# Patient Record
Sex: Female | Born: 1940 | Race: White | Hispanic: No | Smoking: Former smoker
Health system: Southern US, Community
[De-identification: ages and names within clinical notes are randomized; demographics above are authoritative.]

## PROBLEM LIST (undated history)

## (undated) DIAGNOSIS — F419 Anxiety disorder, unspecified: Secondary | ICD-10-CM

## (undated) DIAGNOSIS — E7800 Pure hypercholesterolemia, unspecified: Secondary | ICD-10-CM

## (undated) DIAGNOSIS — Z9889 Other specified postprocedural states: Secondary | ICD-10-CM

## (undated) DIAGNOSIS — J42 Unspecified chronic bronchitis: Secondary | ICD-10-CM

## (undated) DIAGNOSIS — F32A Depression, unspecified: Secondary | ICD-10-CM

## (undated) DIAGNOSIS — F5104 Psychophysiologic insomnia: Secondary | ICD-10-CM

## (undated) DIAGNOSIS — F329 Major depressive disorder, single episode, unspecified: Secondary | ICD-10-CM

## (undated) DIAGNOSIS — I771 Stricture of artery: Secondary | ICD-10-CM

## (undated) DIAGNOSIS — I714 Abdominal aortic aneurysm, without rupture, unspecified: Secondary | ICD-10-CM

## (undated) DIAGNOSIS — J984 Other disorders of lung: Secondary | ICD-10-CM

## (undated) DIAGNOSIS — Z860100 Personal history of colon polyps, unspecified: Secondary | ICD-10-CM

## (undated) DIAGNOSIS — I2510 Atherosclerotic heart disease of native coronary artery without angina pectoris: Secondary | ICD-10-CM

## (undated) DIAGNOSIS — R921 Mammographic calcification found on diagnostic imaging of breast: Secondary | ICD-10-CM

## (undated) DIAGNOSIS — G459 Transient cerebral ischemic attack, unspecified: Secondary | ICD-10-CM

## (undated) DIAGNOSIS — Z8673 Personal history of transient ischemic attack (TIA), and cerebral infarction without residual deficits: Secondary | ICD-10-CM

## (undated) DIAGNOSIS — C801 Malignant (primary) neoplasm, unspecified: Secondary | ICD-10-CM

## (undated) DIAGNOSIS — K219 Gastro-esophageal reflux disease without esophagitis: Secondary | ICD-10-CM

## (undated) DIAGNOSIS — E119 Type 2 diabetes mellitus without complications: Secondary | ICD-10-CM

## (undated) DIAGNOSIS — N183 Chronic kidney disease, stage 3 unspecified: Secondary | ICD-10-CM

## (undated) DIAGNOSIS — I6529 Occlusion and stenosis of unspecified carotid artery: Secondary | ICD-10-CM

## (undated) DIAGNOSIS — I6523 Occlusion and stenosis of bilateral carotid arteries: Secondary | ICD-10-CM

## (undated) DIAGNOSIS — I10 Essential (primary) hypertension: Secondary | ICD-10-CM

## (undated) DIAGNOSIS — J449 Chronic obstructive pulmonary disease, unspecified: Secondary | ICD-10-CM

## (undated) DIAGNOSIS — G43909 Migraine, unspecified, not intractable, without status migrainosus: Secondary | ICD-10-CM

## (undated) DIAGNOSIS — E079 Disorder of thyroid, unspecified: Secondary | ICD-10-CM

## (undated) DIAGNOSIS — I639 Cerebral infarction, unspecified: Secondary | ICD-10-CM

## (undated) DIAGNOSIS — M810 Age-related osteoporosis without current pathological fracture: Secondary | ICD-10-CM

## (undated) DIAGNOSIS — R002 Palpitations: Secondary | ICD-10-CM

## (undated) DIAGNOSIS — E039 Hypothyroidism, unspecified: Secondary | ICD-10-CM

## (undated) DIAGNOSIS — I701 Atherosclerosis of renal artery: Secondary | ICD-10-CM

## (undated) DIAGNOSIS — N289 Disorder of kidney and ureter, unspecified: Secondary | ICD-10-CM

## (undated) DIAGNOSIS — I739 Peripheral vascular disease, unspecified: Secondary | ICD-10-CM

## (undated) DIAGNOSIS — R0602 Shortness of breath: Secondary | ICD-10-CM

## (undated) DIAGNOSIS — E1159 Type 2 diabetes mellitus with other circulatory complications: Secondary | ICD-10-CM

## (undated) DIAGNOSIS — H02039 Senile entropion of unspecified eye, unspecified eyelid: Secondary | ICD-10-CM

## (undated) DIAGNOSIS — M1990 Unspecified osteoarthritis, unspecified site: Secondary | ICD-10-CM

## (undated) HISTORY — PX: CAROTID PTA/STENT INTERVENTION: CATH118231

## (undated) HISTORY — DX: Type 2 diabetes mellitus with other circulatory complications: E11.59

## (undated) HISTORY — DX: Shortness of breath: R06.02

## (undated) HISTORY — DX: Hypothyroidism, unspecified: E03.9

## (undated) HISTORY — DX: Migraine, unspecified, not intractable, without status migrainosus: G43.909

## (undated) HISTORY — DX: Depression, unspecified: F32.A

## (undated) HISTORY — DX: Age-related osteoporosis without current pathological fracture: M81.0

## (undated) HISTORY — DX: Mammographic calcification found on diagnostic imaging of breast: R92.1

## (undated) HISTORY — PX: MEDIASTINOSCOPY: SUR861

## (undated) HISTORY — PX: ABDOMINAL AORTIC ANEURYSM REPAIR: SUR1152

## (undated) HISTORY — DX: Gastro-esophageal reflux disease without esophagitis: K21.9

## (undated) HISTORY — DX: Unspecified osteoarthritis, unspecified site: M19.90

## (undated) HISTORY — DX: Anxiety disorder, unspecified: F41.9

## (undated) HISTORY — DX: Peripheral vascular disease, unspecified: I73.9

## (undated) HISTORY — PX: EYE SURGERY: SHX253

## (undated) HISTORY — PX: MASTECTOMY: SHX3

## (undated) HISTORY — PX: BACK SURGERY: SHX140

## (undated) HISTORY — DX: Psychophysiologic insomnia: F51.04

## (undated) HISTORY — DX: Transient cerebral ischemic attack, unspecified: G45.9

## (undated) HISTORY — DX: Chronic kidney disease, stage 3 (moderate): N18.3

## (undated) HISTORY — DX: Other specified postprocedural states: Z98.890

## (undated) HISTORY — PX: BRONCHOSCOPY: SUR163

## (undated) HISTORY — DX: Chronic kidney disease, stage 3 unspecified: N18.30

## (undated) HISTORY — PX: CARDIAC SURGERY: SHX584

## (undated) HISTORY — DX: Stricture of artery: I77.1

## (undated) HISTORY — DX: Palpitations: R00.2

## (undated) HISTORY — PX: COLONOSCOPY: SHX174

## (undated) HISTORY — DX: Major depressive disorder, single episode, unspecified: F32.9

## (undated) HISTORY — DX: Occlusion and stenosis of unspecified carotid artery: I65.29

## (undated) HISTORY — DX: Personal history of transient ischemic attack (TIA), and cerebral infarction without residual deficits: Z86.73

---

## 2006-06-10 ENCOUNTER — Ambulatory Visit: Payer: Self-pay

## 2006-06-12 ENCOUNTER — Ambulatory Visit: Payer: Self-pay

## 2006-07-01 ENCOUNTER — Ambulatory Visit: Payer: Self-pay | Admitting: Surgery

## 2006-07-01 HISTORY — PX: BREAST EXCISIONAL BIOPSY: SUR124

## 2007-06-23 ENCOUNTER — Ambulatory Visit: Payer: Self-pay

## 2008-06-23 ENCOUNTER — Ambulatory Visit: Payer: Self-pay | Admitting: Vascular Surgery

## 2008-06-26 ENCOUNTER — Ambulatory Visit: Payer: Self-pay | Admitting: Internal Medicine

## 2008-07-06 ENCOUNTER — Inpatient Hospital Stay: Payer: Self-pay | Admitting: Internal Medicine

## 2008-07-25 ENCOUNTER — Ambulatory Visit: Payer: Self-pay | Admitting: Vascular Surgery

## 2008-08-04 ENCOUNTER — Ambulatory Visit: Payer: Self-pay | Admitting: Vascular Surgery

## 2008-08-11 ENCOUNTER — Inpatient Hospital Stay: Payer: Self-pay | Admitting: Vascular Surgery

## 2008-09-01 HISTORY — PX: BREAST EXCISIONAL BIOPSY: SUR124

## 2008-09-03 ENCOUNTER — Emergency Department: Payer: Self-pay | Admitting: Internal Medicine

## 2008-11-07 ENCOUNTER — Ambulatory Visit: Payer: Self-pay | Admitting: Gastroenterology

## 2009-07-30 ENCOUNTER — Ambulatory Visit: Payer: Self-pay | Admitting: Internal Medicine

## 2009-08-02 ENCOUNTER — Ambulatory Visit: Payer: Self-pay | Admitting: Internal Medicine

## 2009-08-09 ENCOUNTER — Ambulatory Visit: Payer: Self-pay | Admitting: Surgery

## 2009-08-14 ENCOUNTER — Ambulatory Visit: Payer: Self-pay | Admitting: Surgery

## 2010-08-01 ENCOUNTER — Ambulatory Visit: Payer: Self-pay | Admitting: Internal Medicine

## 2011-09-04 ENCOUNTER — Ambulatory Visit: Payer: Self-pay | Admitting: Internal Medicine

## 2011-09-19 ENCOUNTER — Ambulatory Visit: Payer: Self-pay | Admitting: Gastroenterology

## 2011-09-23 LAB — PATHOLOGY REPORT

## 2012-04-12 ENCOUNTER — Ambulatory Visit: Payer: Self-pay | Admitting: Specialist

## 2012-04-12 LAB — CREATININE, SERUM
Creatinine: 1.28 mg/dL (ref 0.60–1.30)
EGFR (Non-African Amer.): 43 — ABNORMAL LOW

## 2012-09-07 ENCOUNTER — Ambulatory Visit: Payer: Self-pay | Admitting: Internal Medicine

## 2012-10-05 ENCOUNTER — Ambulatory Visit: Payer: Self-pay | Admitting: Specialist

## 2012-10-18 ENCOUNTER — Ambulatory Visit: Payer: Self-pay | Admitting: Vascular Surgery

## 2012-10-18 LAB — CREATININE, SERUM
Creatinine: 1.15 mg/dL (ref 0.60–1.30)
EGFR (African American): 56 — ABNORMAL LOW
EGFR (Non-African Amer.): 48 — ABNORMAL LOW

## 2012-10-18 LAB — BUN: BUN: 23 mg/dL — ABNORMAL HIGH (ref 7–18)

## 2012-11-26 ENCOUNTER — Ambulatory Visit: Payer: Self-pay | Admitting: Unknown Physician Specialty

## 2012-12-28 ENCOUNTER — Ambulatory Visit: Payer: Self-pay

## 2013-06-01 DIAGNOSIS — M48061 Spinal stenosis, lumbar region without neurogenic claudication: Secondary | ICD-10-CM | POA: Insufficient documentation

## 2013-07-18 DIAGNOSIS — I10 Essential (primary) hypertension: Secondary | ICD-10-CM | POA: Insufficient documentation

## 2013-07-18 DIAGNOSIS — E039 Hypothyroidism, unspecified: Secondary | ICD-10-CM | POA: Insufficient documentation

## 2013-07-18 DIAGNOSIS — Z9889 Other specified postprocedural states: Secondary | ICD-10-CM | POA: Insufficient documentation

## 2013-07-18 DIAGNOSIS — F3342 Major depressive disorder, recurrent, in full remission: Secondary | ICD-10-CM | POA: Insufficient documentation

## 2013-07-18 DIAGNOSIS — K219 Gastro-esophageal reflux disease without esophagitis: Secondary | ICD-10-CM | POA: Insufficient documentation

## 2013-07-25 HISTORY — PX: LAMINECTOMY: SHX219

## 2013-09-22 ENCOUNTER — Ambulatory Visit: Payer: Self-pay | Admitting: Internal Medicine

## 2013-09-28 ENCOUNTER — Ambulatory Visit: Payer: Self-pay | Admitting: Internal Medicine

## 2013-11-09 DIAGNOSIS — M1610 Unilateral primary osteoarthritis, unspecified hip: Secondary | ICD-10-CM | POA: Insufficient documentation

## 2013-12-22 ENCOUNTER — Ambulatory Visit: Payer: Self-pay | Admitting: Orthopedic Surgery

## 2014-02-08 DIAGNOSIS — F329 Major depressive disorder, single episode, unspecified: Secondary | ICD-10-CM | POA: Insufficient documentation

## 2014-02-08 DIAGNOSIS — I739 Peripheral vascular disease, unspecified: Secondary | ICD-10-CM | POA: Insufficient documentation

## 2014-02-08 DIAGNOSIS — F32A Depression, unspecified: Secondary | ICD-10-CM | POA: Insufficient documentation

## 2014-02-08 DIAGNOSIS — F5104 Psychophysiologic insomnia: Secondary | ICD-10-CM | POA: Insufficient documentation

## 2014-02-08 DIAGNOSIS — M810 Age-related osteoporosis without current pathological fracture: Secondary | ICD-10-CM | POA: Insufficient documentation

## 2014-02-08 DIAGNOSIS — E1159 Type 2 diabetes mellitus with other circulatory complications: Secondary | ICD-10-CM | POA: Insufficient documentation

## 2014-02-08 DIAGNOSIS — F419 Anxiety disorder, unspecified: Secondary | ICD-10-CM

## 2014-03-14 DIAGNOSIS — M25551 Pain in right hip: Secondary | ICD-10-CM | POA: Insufficient documentation

## 2014-04-04 ENCOUNTER — Ambulatory Visit: Payer: Self-pay | Admitting: Internal Medicine

## 2014-04-06 DIAGNOSIS — R921 Mammographic calcification found on diagnostic imaging of breast: Secondary | ICD-10-CM | POA: Insufficient documentation

## 2014-04-14 ENCOUNTER — Ambulatory Visit: Payer: Self-pay | Admitting: Specialist

## 2014-08-11 DIAGNOSIS — N183 Chronic kidney disease, stage 3 unspecified: Secondary | ICD-10-CM | POA: Insufficient documentation

## 2014-08-11 DIAGNOSIS — J449 Chronic obstructive pulmonary disease, unspecified: Secondary | ICD-10-CM | POA: Insufficient documentation

## 2014-10-06 ENCOUNTER — Ambulatory Visit: Payer: Self-pay | Admitting: Internal Medicine

## 2014-10-11 DIAGNOSIS — M1611 Unilateral primary osteoarthritis, right hip: Secondary | ICD-10-CM | POA: Insufficient documentation

## 2014-11-13 ENCOUNTER — Ambulatory Visit: Payer: Self-pay | Admitting: Vascular Surgery

## 2014-12-22 NOTE — Op Note (Signed)
PATIENT NAME:  Tamara Finley, Tamara Finley MR#:  [id number] DATE OF BIRTH:  1941/07/20  DATE OF PROCEDURE:  10/18/2012  PREOPERATIVE DIAGNOSES: 1. Peripheral arterial disease with claudication, bilateral lower extremities.  2. 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  3. Hypertension.   POSTOPERATIVE DIAGNOSES:  1. Peripheral arterial disease with claudication, bilateral lower extremities.  2. 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  3. Hypertension.   PROCEDURES PERFORMED: 1. Ultrasound guidance for vascular access, left femoral artery.  2. Catheter placement into aorta from left femoral approach.  3. Aortogram and iliofemoral arteriogram.  4. StarClose closure device, left femoral artery.   SURGEON: Algernon Huxley, M.D.   ANESTHESIA: Local with moderate conscious sedation.   ESTIMATED BLOOD LOSS: Minimal.   INDICATION FOR PROCEDURE: This is a 74 year old white female who is complaining of lower extremity pain with activity. She was found to have some malperfusion on noninvasive studies, a little worse on the right. Imaging is performed as there is suggestion of potential flow problems within the iliac system. Risks and benefits were discussed and informed consent was obtained.   DESCRIPTION OF PROCEDURE: The patient was brought to the vascular and interventional radiology suite. Groins were shaved and prepped and a sterile surgical field was created. The left femoral artery was visualized with ultrasound and found to be patent. It was then accessed under direct ultrasound guidance without difficulty with a Seldinger needle. A J-wire was placed and 5-French sheath was then placed. Pigtail catheter was placed, in the aorta, at the L1-L2 level, and aortogram was performed. This showed what appeared to be renal arteries with mild stenosis that was not flow limiting in either renal artery. There was a somewhat ringlike stenosis at the proximal  anastomosis. This did not appear high-grade. This appeared to be in the 50% range likely, at the worst. The left iliac system was slightly redundant but was patent. The right iliac system had a very mild narrowing at the origin of the right iliac limb, but this was less than 30% and then was patent down to the iliac arteries and throughout the iliac system. At this point, there were no straight forward easy endovascular options for repair today. The stenosis appeared to be just below the renal arteries and it was unclear if this would be amenable to any sort of stent graft repair in the future. Again, this was not high-grade and did not appear to be a very flow-limiting lesion and so need for repair at this time is unlikely. I elected to terminate the procedure. The StarClose closure device was deployed in the left femoral artery with excellent hemostatic result. The patient tolerated the procedure well and was taken to the recovery room in stable condition. ____________________________ Algernon Huxley, MD jsd:sb D: 10/18/2012 10:54:40 ET T: 10/18/2012 13:43:57 ET JOB#: [id number]  cc: Algernon Huxley, MD, <Dictator> Algernon Huxley MD ELECTRONICALLY SIGNED 10/18/2012 16:58

## 2014-12-31 NOTE — Op Note (Signed)
PATIENT NAME:  Tamara Finley, Tamara Finley MR#:  [id number] DATE OF BIRTH:  Apr 11, 1941  DATE OF PROCEDURE:  11/13/2014  PREOPERATIVE DIAGNOSES: 1.  Bilateral renal artery stenosis with small right kidney.  2.  Status post aortobiiliac bypass.  3.  Severe poorly controlled hypertension.  4.  Chronic kidney disease   POSTOPERATIVE DIAGNOSES:  1.  Bilateral renal artery stenosis with small right kidney.  2.  Status post aortobiiliac bypass.  3.  Severe poorly controlled hypertension.  4.  Chronic kidney disease   PROCEDURES: 1.  Ultrasound guidance for vascular access to right femoral artery.  2.  Catheter placement to left renal artery from right femoral approach.  3.  Aortogram and selective left renal angiogram.  4.  Placement of a 6 mm diameter x 18 mm length balloon expandable stent to the left renal artery with the use of the Medtronic PercuSurge embolic protection device.  5.  StarClose closure device of the right femoral artery.   SURGEON: Leotis Pain, MD  ANESTHESIA: Local with moderate conscious sedation.   ESTIMATED BLOOD LOSS: 25 mL.   INDICATION FOR PROCEDURE: This is a 74 year old female who has noninvasive studies suggesting bilateral renal artery stenosis. Her right kidney is small and suspected poorly functioning. In addition to her renal artery stenosis, she has severe hypertension and chronic kidney disease. With these findings she is brought in for an angiogram for further evaluation and potential treatment of her renal artery stenosis. Risks and benefits were discussed. Informed consent was obtained.   DESCRIPTION OF PROCEDURE: The patient is brought to the vascular suite. Groins were shaved and prepped and a sterile surgical field was created. The right femoral head was localized with fluoroscopy and the right femoral artery was accessed without difficulty with a Seldinger needle under direct ultrasound guidance and a permanent image was recorded. A J-wire and 5-French sheath were  then placed. Pigtail catheter was placed but this required using an Advantage and a Kumpe catheter and imaging through the right femoral sheath to navigate through her aortobiiliac bypass. There was probably a 60 to 70% stenosis of the aorta just proximal to her bypass and the limbs were somewhat redundant, more so on the left than the right. Once the Pigtail catheter was up, imaging was performed. The right nephrogram was poor. The right renal artery appeared to come off a very calcific somewhat aneurysmal portion of the aorta and have a high-grade stenosis, although it was not easily opacified on initial imaging. The left renal artery stenosis appeared to be in the 80 to 85% range on the initial angiogram. As this was the more dominant functional kidney, this was our main target for today. I selectively cannulated the left renal artery after upsizing to a 6-French sheath and giving the patient 4000 units of intravenous heparin and 12.5 grams of mannitol. The selective image of the left renal artery showed again what appeared to be an 80to 85% stenosis in the proximal main left renal artery with a good nephrogram distally and a good normal artery after the first 6 to 8 mm. I then used the Medtronic PercuSurge embolic protection device, parked this in the distal main renal artery and inflated it to 6 mm, which imaging showed good placement of the distal balloon. I then brought a 6 mm diameter x 18 mm length balloon expandable stent over the wire and into the proximal main renal artery bringing its proximal portion out about 2 to 3 mm into the aorta encompassing the entire  lesion and to normal renal artery beyond the stenosis. This was inflated to 12 atmospheres. A tight narrowing was seen, which resolved with inflation, and completion angiogram showed the stent to be widely patent with a good nephrogram. We aspirated 2 syringes of blood and flushed the artery before deflating the embolic protection device and total  time of inflation was about 5 minutes. At this point, I elected to terminate the procedure. The guide catheter was removed, oblique arteriogram was performed of the right femoral artery and StarClose closure device was deployed in the usual fashion with excellent hemostatic result. The patient tolerated the procedure well and was taken to the recovery room in stable condition.   ____________________________ Algernon Huxley, MD jsd:sb D: 11/13/2014 10:01:37 ET T: 11/13/2014 10:12:13 ET JOB#: [id number]  cc: Algernon Huxley, MD, <Dictator> Christena Flake. Raechel Ache, MD Algernon Huxley MD ELECTRONICALLY SIGNED 11/20/2014 15:00

## 2015-07-27 ENCOUNTER — Encounter: Payer: Self-pay | Admitting: Emergency Medicine

## 2015-07-27 ENCOUNTER — Ambulatory Visit
Admission: EM | Admit: 2015-07-27 | Discharge: 2015-07-27 | Disposition: A | Discharge status: Home / Self Care | Payer: Medicare HMO | Attending: Family Medicine | Admitting: Family Medicine

## 2015-07-27 ENCOUNTER — Ambulatory Visit (INDEPENDENT_AMBULATORY_CARE_PROVIDER_SITE_OTHER): Payer: Medicare HMO

## 2015-07-27 DIAGNOSIS — J209 Acute bronchitis, unspecified: Secondary | ICD-10-CM

## 2015-07-27 DIAGNOSIS — J0110 Acute frontal sinusitis, unspecified: Secondary | ICD-10-CM

## 2015-07-27 DIAGNOSIS — H6593 Unspecified nonsuppurative otitis media, bilateral: Secondary | ICD-10-CM

## 2015-07-27 HISTORY — DX: Essential (primary) hypertension: I10

## 2015-07-27 MED ORDER — MINOCYCLINE HCL 100 MG PO CAPS: 100.0000 mg | ORAL_CAPSULE | Freq: Two times a day (BID) | ORAL | Status: AC

## 2015-07-27 MED ORDER — PREDNISONE 50 MG PO TABS: 50.0000 mg | ORAL_TABLET | Freq: Every day | ORAL | Status: AC

## 2015-07-27 MED ORDER — IPRATROPIUM-ALBUTEROL 0.5-2.5 (3) MG/3ML IN SOLN
3.0000 mL | Freq: Once | RESPIRATORY_TRACT | Status: AC
  Administered 2015-07-27: 3 mL via RESPIRATORY_TRACT

## 2015-07-27 MED ORDER — FLUTICASONE PROPIONATE 50 MCG/ACT NA SUSP: 1.0000 | Freq: Two times a day (BID) | NASAL | Status: DC

## 2015-07-27 MED ORDER — BENZONATATE 100 MG PO CAPS: 100.0000 mg | ORAL_CAPSULE | Freq: Three times a day (TID) | ORAL | Status: AC

## 2015-07-27 MED ORDER — SALINE SPRAY 0.65 % NA SOLN: 2.0000 | NASAL | Status: DC

## 2015-07-27 NOTE — Discharge Instructions (Signed)
Acute Bronchitis °Bronchitis is inflammation of the airways that extend from the windpipe into the lungs (bronchi). The inflammation often causes mucus to develop. This leads to a cough, which is the most common symptom of bronchitis.  °In acute bronchitis, the condition usually develops suddenly and goes away over time, usually in a couple weeks. Smoking, allergies, and asthma can make bronchitis worse. Repeated episodes of bronchitis may cause further lung problems.  °CAUSES °Acute bronchitis is most often caused by the same virus that causes a cold. The virus can spread from person to person (contagious) through coughing, sneezing, and touching contaminated objects. °SIGNS AND SYMPTOMS  °· Cough.   °· Fever.   °· Coughing up mucus.   °· Body aches.   °· Chest congestion.   °· Chills.   °· Shortness of breath.   °· Sore throat.   °DIAGNOSIS  °Acute bronchitis is usually diagnosed through a physical exam. Your health care provider will also ask you questions about your medical history. Tests, such as chest X-rays, are sometimes done to rule out other conditions.  °TREATMENT  °Acute bronchitis usually goes away in a couple weeks. Oftentimes, no medical treatment is necessary. Medicines are sometimes given for relief of fever or cough. Antibiotic medicines are usually not needed but may be prescribed in certain situations. In some cases, an inhaler may be recommended to help reduce shortness of breath and control the cough. A cool mist vaporizer may also be used to help thin bronchial secretions and make it easier to clear the chest.  °HOME CARE INSTRUCTIONS °· Get plenty of rest.   °· Drink enough fluids to keep your urine clear or pale yellow (unless you have a medical condition that requires fluid restriction). Increasing fluids may help thin your respiratory secretions (sputum) and reduce chest congestion, and it will prevent dehydration.   °· Take medicines only as directed by your health care provider. °· If  you were prescribed an antibiotic medicine, finish it all even if you start to feel better. °· Avoid smoking and secondhand smoke. Exposure to cigarette smoke or irritating chemicals will make bronchitis worse. If you are a smoker, consider using nicotine gum or skin patches to help control withdrawal symptoms. Quitting smoking will help your lungs heal faster.   °· Reduce the chances of another bout of acute bronchitis by washing your hands frequently, avoiding people with cold symptoms, and trying not to touch your hands to your mouth, nose, or eyes.   °· Keep all follow-up visits as directed by your health care provider.   °SEEK MEDICAL CARE IF: °Your symptoms do not improve after 1 week of treatment.  °SEEK IMMEDIATE MEDICAL CARE IF: °· You develop an increased fever or chills.   °· You have chest pain.   °· You have severe shortness of breath. °· You have bloody sputum.   °· You develop dehydration. °· You faint or repeatedly feel like you are going to pass out. °· You develop repeated vomiting. °· You develop a severe headache. °MAKE SURE YOU:  °· Understand these instructions. °· Will watch your condition. °· Will get help right away if you are not doing well or get worse. °  °This information is not intended to replace advice given to you by your health care provider. Make sure you discuss any questions you have with your health care provider. °  °Document Released: 09/25/2004 Document Revised: 09/08/2014 Document Reviewed: 02/08/2013 °Elsevier Interactive Patient Education ©2016 [employer] °Sinusitis, Adult °Sinusitis is redness, soreness, and inflammation of the paranasal sinuses. Paranasal sinuses are air pockets within the   bones of your face. They are located beneath your eyes, in the middle of your forehead, and above your eyes. In healthy paranasal sinuses, mucus is able to drain out, and air is able to circulate through them by way of your nose. However, when your paranasal sinuses are inflamed,  mucus and air can become trapped. This can allow bacteria and other germs to grow and cause infection. °Sinusitis can develop quickly and last only a short time (acute) or continue over a long period (chronic). Sinusitis that lasts for more than 12 weeks is considered chronic. °CAUSES °Causes of sinusitis include: °· Allergies. °· Structural abnormalities, such as displacement of the cartilage that separates your nostrils (deviated septum), which can decrease the air flow through your nose and sinuses and affect sinus drainage. °· Functional abnormalities, such as when the small hairs (cilia) that line your sinuses and help remove mucus do not work properly or are not present. °SIGNS AND SYMPTOMS °Symptoms of acute and chronic sinusitis are the same. The primary symptoms are pain and pressure around the affected sinuses. Other symptoms include: °· Upper toothache. °· Earache. °· Headache. °· Bad breath. °· Decreased sense of smell and taste. °· A cough, which worsens when you are lying flat. °· Fatigue. °· Fever. °· Thick drainage from your nose, which often is green and may contain pus (purulent). °· Swelling and warmth over the affected sinuses. °DIAGNOSIS °Your health care provider will perform a physical exam. During your exam, your health care provider may perform any of the following to help determine if you have acute sinusitis or chronic sinusitis: °· Look in your nose for signs of abnormal growths in your nostrils (nasal polyps). °· Tap over the affected sinus to check for signs of infection. °· View the inside of your sinuses using an imaging device that has a light attached (endoscope). °If your health care provider suspects that you have chronic sinusitis, one or more of the following tests may be recommended: °· Allergy tests. °· Nasal culture. A sample of mucus is taken from your nose, sent to a lab, and screened for bacteria. °· Nasal cytology. A sample of mucus is taken from your nose and examined by  your health care provider to determine if your sinusitis is related to an allergy. °TREATMENT °Most cases of acute sinusitis are related to a viral infection and will resolve on their own within 10 days. Sometimes, medicines are prescribed to help relieve symptoms of both acute and chronic sinusitis. These may include pain medicines, decongestants, nasal steroid sprays, or saline sprays. °However, for sinusitis related to a bacterial infection, your health care provider will prescribe antibiotic medicines. These are medicines that will help kill the bacteria causing the infection. °Rarely, sinusitis is caused by a fungal infection. In these cases, your health care provider will prescribe antifungal medicine. °For some cases of chronic sinusitis, surgery is needed. Generally, these are cases in which sinusitis recurs more than 3 times per year, despite other treatments. °HOME CARE INSTRUCTIONS °· Drink plenty of water. Water helps thin the mucus so your sinuses can drain more easily. °· Use a humidifier. °· Inhale steam 3-4 times a day (for example, sit in the bathroom with the shower running). °· Apply a warm, moist washcloth to your face 3-4 times a day, or as directed by your health care provider. °· Use saline nasal sprays to help moisten and clean your sinuses. °· Take medicines only as directed by your health care provider. °· If   you were prescribed either an antibiotic or antifungal medicine, finish it all even if you start to feel better. °SEEK IMMEDIATE MEDICAL CARE IF: °· You have increasing pain or severe headaches. °· You have nausea, vomiting, or drowsiness. °· You have swelling around your face. °· You have vision problems. °· You have a stiff neck. °· You have difficulty breathing. °  °This information is not intended to replace advice given to you by your health care provider. Make sure you discuss any questions you have with your health care provider. °  °Document Released: 08/18/2005 Document  Revised: 09/08/2014 Document Reviewed: 09/02/2011 °Elsevier Interactive Patient Education ©2016 [employer] °Otitis Media With Effusion °Otitis media with effusion is the presence of fluid in the middle ear. This is a common problem in children, which often follows ear infections. It may be present for weeks or longer after the infection. Unlike an acute ear infection, otitis media with effusion refers only to fluid behind the ear drum and not infection. Children with repeated ear and sinus infections and allergy problems are the most likely to get otitis media with effusion. °CAUSES  °The most frequent cause of the fluid buildup is dysfunction of the eustachian tubes. These are the tubes that drain fluid in the ears to the back of the nose (nasopharynx). °SYMPTOMS  °· The main symptom of this condition is hearing loss. As a result, you or your child may: °· Listen to the TV at a loud volume. °· Not respond to questions. °· Ask "what" often when spoken to. °· Mistake or confuse one sound or word for another. °· There may be a sensation of fullness or pressure but usually not pain. °DIAGNOSIS  °· Your health care provider will diagnose this condition by examining you or your child's ears. °· Your health care provider may test the pressure in you or your child's ear with a tympanometer. °· A hearing test may be conducted if the problem persists. °TREATMENT  °· Treatment depends on the duration and the effects of the effusion. °· Antibiotics, decongestants, nose drops, and cortisone-type drugs (tablets or nasal spray) may not be helpful. °· Children with persistent ear effusions may have delayed language or behavioral problems. Children at risk for developmental delays in hearing, learning, and speech may require referral to a specialist earlier than children not at risk. °· You or your child's health care provider may suggest a referral to an ear, nose, and throat surgeon for treatment. The following may help  restore normal hearing: °· Drainage of fluid. °· Placement of ear tubes (tympanostomy tubes). °· Removal of adenoids (adenoidectomy). °HOME CARE INSTRUCTIONS  °· Avoid secondhand smoke. °· Infants who are breastfed are less likely to have this condition. °· Avoid feeding infants while they are lying flat. °· Avoid known environmental allergens. °· Avoid people who are sick. °SEEK MEDICAL CARE IF:  °· Hearing is not better in 3 months. °· Hearing is worse. °· Ear pain. °· Drainage from the ear. °· Dizziness. °MAKE SURE YOU:  °· Understand these instructions. °· Will watch your condition. °· Will get help right away if you are not doing well or get worse. °  °This information is not intended to replace advice given to you by your health care provider. Make sure you discuss any questions you have with your health care provider. °  °Document Released: 09/25/2004 Document Revised: 09/08/2014 Document Reviewed: 03/15/2013 °Elsevier Interactive Patient Education ©2016 [employer] ° °

## 2015-07-27 NOTE — ED Provider Notes (Signed)
CSN: [id number]     Arrival date & time 07/27/15  0802 History   None    Chief Complaint  Patient presents with  . Facial Pain  . Nasal Congestion   (Consider location/radiation/quality/duration/timing/severity/associated sxs/prior Treatment) HPI Comments: Single caucasian female retired here for evaluation of cough productive green to clear x 4 days, wheezing, nasal congestion, frontal headache.  Talked with pharmacist at [facility] taking small green pill not helping cough worst at night interrupting sleep.  Ran out of flonase using symbicort.  Ear pressure, normal back pain and bruising even though PCM took off Plavix that was started for stent.    The history is provided by the patient.    Past Medical History  Diagnosis Date  . Hypertension    Past Surgical History  Procedure Laterality Date  . Cardiac surgery     History reviewed. No pertinent family history. Social History  Substance Use Topics  . Smoking status: Former Research scientist (life sciences)  . Smokeless tobacco: None  . Alcohol Use: No   OB History    No data available     Review of Systems  Constitutional: Negative for fever, chills, diaphoresis, activity change, appetite change, fatigue and unexpected weight change.  HENT: Positive for congestion, ear pain, postnasal drip and sinus pressure. Negative for dental problem, drooling, ear discharge, facial swelling, hearing loss, mouth sores, nosebleeds, rhinorrhea, sneezing, sore throat, tinnitus, trouble swallowing and voice change.   Eyes: Negative for photophobia, pain, discharge, redness, itching and visual disturbance.  Respiratory: Positive for cough. Negative for choking, chest tightness, shortness of breath, wheezing and stridor.   Cardiovascular: Negative for chest pain, palpitations and leg swelling.  Gastrointestinal: Negative for nausea, vomiting, abdominal pain, diarrhea, constipation, blood in stool and abdominal distention.  Endocrine: Negative for cold intolerance and  heat intolerance.  Genitourinary: Negative for dysuria, hematuria and difficulty urinating.  Musculoskeletal: Positive for back pain. Negative for myalgias, joint swelling, arthralgias, gait problem, neck pain and neck stiffness.  Skin: Negative for color change, pallor, rash and wound.  Allergic/Immunologic: Positive for environmental allergies. Negative for food allergies.  Neurological: Positive for headaches. Negative for dizziness, tremors, seizures, syncope, facial asymmetry, speech difficulty, weakness, light-headedness and numbness.  Hematological: Negative for adenopathy. Does not bruise/bleed easily.  Psychiatric/Behavioral: Positive for sleep disturbance. Negative for behavioral problems, confusion and agitation.    Allergies  Review of patient's allergies indicates no known allergies.  Home Medications   Prior to Admission medications   Medication Sig Start Date End Date Taking? Authorizing Provider  albuterol (PROVENTIL HFA;VENTOLIN HFA) 108 (90 BASE) MCG/ACT inhaler Inhale 2 puffs into the lungs every 6 (six) hours as needed for wheezing or shortness of breath.   Yes Historical Provider, MD  budesonide-formoterol (SYMBICORT) 160-4.5 MCG/ACT inhaler Inhale 2 puffs into the lungs 2 (two) times daily.   Yes Historical Provider, MD  Cholecalciferol (VITAMIN D3) 10000 UNITS TABS Take 1 tablet by mouth 2 (two) times daily.   Yes Historical Provider, MD  doxepin (SINEQUAN) 25 MG capsule Take 25 mg by mouth at bedtime.   Yes Historical Provider, MD  hydrochlorothiazide (HYDRODIURIL) 25 MG tablet Take 25 mg by mouth daily.   Yes Historical Provider, MD  levothyroxine (SYNTHROID, LEVOTHROID) 88 MCG tablet Take 88 mcg by mouth daily before breakfast.   Yes Historical Provider, MD  Magnesium 250 MG TABS Take 1 tablet by mouth daily.   Yes Historical Provider, MD  metoprolol succinate (TOPROL-XL) 50 MG 24 hr tablet Take 50 mg by mouth daily.  Take with or immediately following a meal.   Yes  Historical Provider, MD  omega-3 acid ethyl esters (LOVAZA) 1 G capsule Take 1 g by mouth daily.   Yes Historical Provider, MD  pantoprazole (PROTONIX) 40 MG tablet Take 40 mg by mouth daily.   Yes Historical Provider, MD  simvastatin (ZOCOR) 80 MG tablet Take 80 mg by mouth daily.   Yes Historical Provider, MD  traMADol (ULTRAM) 50 MG tablet Take 50 mg by mouth every 6 (six) hours as needed.   Yes Historical Provider, MD  benzonatate (TESSALON) 100 MG capsule Take 1 capsule (100 mg total) by mouth every 8 (eight) hours. 07/27/15 08/02/15  Olen Cordial, NP  fluticasone (FLONASE) 50 MCG/ACT nasal spray Place 1 spray into both nostrils 2 (two) times daily. 07/27/15 08/26/15  Olen Cordial, NP  minocycline (MINOCIN,DYNACIN) 100 MG capsule Take 1 capsule (100 mg total) by mouth 2 (two) times daily. 07/29/15 08/04/15  Olen Cordial, NP  predniSONE (DELTASONE) 50 MG tablet Take 1 tablet (50 mg total) by mouth daily with breakfast. 07/27/15 07/31/15  Olen Cordial, NP  sodium chloride (OCEAN) 0.65 % SOLN nasal spray Place 2 sprays into both nostrils every 2 (two) hours while awake. 07/27/15   Olen Cordial, NP   Meds Ordered and Administered this Visit   Medications  ipratropium-albuterol (DUONEB) 0.5-2.5 (3) MG/3ML nebulizer solution 3 mL (3 mLs Nebulization Given 07/27/15 0835)    BP 158/68 mmHg  Pulse 60  Temp(Src) 96.8 F (36 C) (Tympanic)  Resp 16  Ht 5\' 5"  (1.651 m)  Wt 185 lb (83.915 kg)  BMI 30.79 kg/m2  SpO2 98% No data found.   Physical Exam  Constitutional: She is oriented to person, place, and time. She appears well-developed and well-nourished. She is active and cooperative.  Non-toxic appearance. She does not have a sickly appearance. She appears ill. No distress.  HENT:  Head: Normocephalic and atraumatic.  Right Ear: Hearing, external ear and ear canal normal. A middle ear effusion is present.  Left Ear: Hearing, external ear and ear canal normal. A  middle ear effusion is present.  Nose: Mucosal edema and rhinorrhea present. No nose lacerations, sinus tenderness, nasal deformity, septal deviation or nasal septal hematoma. No epistaxis.  No foreign bodies. Right sinus exhibits frontal sinus tenderness. Right sinus exhibits no maxillary sinus tenderness. Left sinus exhibits frontal sinus tenderness. Left sinus exhibits no maxillary sinus tenderness.  Mouth/Throat: Uvula is midline. Mucous membranes are not pale, dry and not cyanotic. She does not have dentures. No oral lesions. No trismus in the jaw. Normal dentition. No dental abscesses, uvula swelling, lacerations or dental caries. Posterior oropharyngeal edema and posterior oropharyngeal erythema present. No oropharyngeal exudate or tonsillar abscesses.  Dry mucous membranes; bilateral nasal turbinates with edema/erythema; bilateral TMs with air fluid level clear; right auditory canal 50% occluded with yellow cerumen  Eyes: Conjunctivae, EOM and lids are normal. Pupils are equal, round, and reactive to light. Right eye exhibits no chemosis, no discharge, no exudate and no hordeolum. No foreign body present in the right eye. Left eye exhibits no chemosis, no discharge, no exudate and no hordeolum. No foreign body present in the left eye. Right conjunctiva is not injected. Right conjunctiva has no hemorrhage. Left conjunctiva is not injected. Left conjunctiva has no hemorrhage. No scleral icterus. Right eye exhibits normal extraocular motion and no nystagmus. Left eye exhibits normal extraocular motion and no nystagmus. Right pupil is round and reactive. Left pupil  is round and reactive. Pupils are equal.  Neck: Trachea normal and normal range of motion. Neck supple. No tracheal tenderness, no spinous process tenderness and no muscular tenderness present. No rigidity. No tracheal deviation, no edema, no erythema and normal range of motion present. No thyroid mass and no thyromegaly present.    Cardiovascular: Normal rate, regular rhythm, S1 normal, S2 normal, normal heart sounds and intact distal pulses.  PMI is not displaced.  Exam reveals no gallop and no friction rub.   No murmur heard. Pulmonary/Chest: Effort normal. No accessory muscle usage or stridor. No respiratory distress. She has decreased breath sounds in the right lower field and the left lower field. She has no wheezes. She has rhonchi in the right middle field and the left middle field. She has no rales. She exhibits no tenderness.  Abdominal: Soft. She exhibits no distension.  Musculoskeletal: Normal range of motion. She exhibits no edema or tenderness.       Right shoulder: Normal.       Left shoulder: Normal.       Right hip: Normal.       Left hip: Normal.       Right knee: Normal.       Left knee: Normal.       Cervical back: Normal.       Lumbar back: She exhibits pain.       Right hand: Normal.       Left hand: Normal.  Lymphadenopathy:       Head (right side): No submental, no submandibular, no tonsillar, no preauricular, no posterior auricular and no occipital adenopathy present.       Head (left side): No submental, no submandibular, no tonsillar, no preauricular, no posterior auricular and no occipital adenopathy present.    She has no cervical adenopathy.       Right cervical: No superficial cervical, no deep cervical and no posterior cervical adenopathy present.      Left cervical: No superficial cervical, no deep cervical and no posterior cervical adenopathy present.  Neurological: She is alert and oriented to person, place, and time. She has normal strength. She is not disoriented. She displays no atrophy and no tremor. No cranial nerve deficit or sensory deficit. She exhibits normal muscle tone. She displays no seizure activity. Coordination and gait normal. GCS eye subscore is 4. GCS verbal subscore is 5. GCS motor subscore is 6.  Skin: Skin is warm, dry and intact. Bruising noted. No abrasion, no  burn, no ecchymosis, no laceration, no lesion, no petechiae and no rash noted. She is not diaphoretic. No cyanosis or erythema. No pallor. Nails show no clubbing.     Psychiatric: She has a normal mood and affect. Her speech is normal and behavior is normal. Judgment and thought content normal. Cognition and memory are normal.  Nursing note and vitals reviewed.   ED Course  Procedures (including critical care time)  Labs Review Labs Reviewed - No data to display  Imaging Review Dg Chest 2 View  07/27/2015  CLINICAL DATA:  Productive cough and wheezing. EXAM: CHEST  2 VIEW COMPARISON:  04/14/2014 FINDINGS: Cardiomediastinal silhouette is normal. Mediastinal contours appear intact. There is no evidence of focal airspace consolidation, pleural effusion or pneumothorax. Osseous structures are without acute abnormality. Postsurgical changes are seen within left thorax. IMPRESSION: No radiographic evidence of acute cardiopulmonary abnormality. Electronically Signed   By: Fidela Salisbury M.D.   On: 07/27/2015 08:58   0855 BBS CTA rhonchi resolved  patient felt better negative egophany all fields.  0925 disucssed chest xray results normal/negative pneumonia/fluid in lungs given copy of radiology report.  To start prednisone 50mg  po daily x 5 days.  Has albuterol inhaler at home for wheezing/protracted cough.  Continue spiriva.  Tessalon pearles 100mg  po TId prn cough.  Restart flonase 1 spray each nostril BID and nasal saline 2 sprays each nostril q2h prn congestion.  Rx given doxycycline 100mg  po BID x 10 days to start Sunday 27 Nov if no relief with 48 hours of saline and flonase.  Discussed hydration as mucous membranes dry patient reported she tends to drink other liquids but not water.  Discussed 1 cup of water per every soda/coffee/tea cup.  Patient verbalized understanding of information/instructions, agreed with plan of care and had no further questions at this time.  MDM   1. Acute  bronchitis, unspecified organism   2. Otitis media with effusion, bilateral   3. Acute frontal sinusitis, recurrence not specified    Supportive treatment.   No evidence of invasive bacterial infection, non toxic and well hydrated.  This is most likely self limiting viral infection.  I do not see where any further testing or imaging is necessary at this time.   I will suggest supportive care, rest, good hygiene and encourage the patient to take adequate fluids.  The patient is to return to clinic or EMERGENCY ROOM if symptoms worsen or change significantly e.g. ear pain, fever, purulent discharge from ears or bleeding.  Exitcare handout on otitis media with effusion given to patient.  Patient verbalized agreement and understanding of treatment plan.    Suspect Viral illness: no evidence of invasive bacterial infection, non toxic and well hydrated.  This is most likely self limiting viral infection.  I do not see where any further testing or imaging is necessary at this time.   I will suggest supportive care, rest, good hygiene and encourage the patient to take adequate fluids.  Does not require work excuse.  Notified patient staff will call with culture results once available next 48+ hours.  Stop [facility] OTC medications. flonase 1 spray each nostril BID prn, nasal saline 1-2 sprays each nostril prn q2h, tylenol 1000mg  po QID prn.  Discussed honey with lemon and salt water gargles for comfort also.  The patient is to return to clinic or EMERGENCY ROOM if symptoms worsen or change significantly e.g. fever, lethargy, SOB, wheezing.  Exitcare handout on bronchitis viral given to patient.  Patient verbalized agreement and understanding of treatment plan.   Would not use augmentin/azithromycin due to chronic medication interactions.  Bronchitis simple, community acquired, may have started as viral (probably respiratory syncytial, parainfluenza, influenza, or adenovirus), but now evidence of acute purulent  bronchitis with resultant bronchial edema and mucus formation.  Viruses are the most common cause of bronchial inflammation in otherwise healthy adults with acute bronchitis.  The appearance of sputum is not predictive of whether a bacterial infection is present.  Purulent sputum is most often caused by viral infections.  There are a small portion of those caused by non-viral agents being Mycoplamsa pneumonia.  Microscopic examination or C&S of sputum in the healthy adult with acute bronchitis is generally not helpful (usually negative or normal respiratory flora) other considerations being cough from upper respiratory tract infections, sinusitis or allergic syndromes (mild asthma or viral pneumonia).  Differential Diagnosis:  reactive airway disease (asthma, allergic aspergillosis (eosinophilia), chronic bronchitis, respiratory infection (Sinusitis, Common cold, pneumonia), congestive heart failure, reflux esophagitis,  bronchogenic tumor, aspiration syndromes and/or exposure irritants/tobacco smoke.  In this case, there is no evidence of any invasive bacterial illness.  Most likely viral etiology so will hold on antibiotic treatment.  Advise supportive care with rest, encourage fluids, good hygiene and watch for any worsening symptoms.  If they were to develop:  come back to the office or go to the emergency room if after hours.  Without high fever, severe dyspnea, lack of physical findings or other risk factors, I will hold on CBC at this time.  I discussed that approximately 50% of patients with acute bronchitis have a cough that lasts up to three weeks, and 25% for over a month.  Tylenol, one to two tablets every four hours as needed for fever or myalgias.   No aspirin.  Patient instructed to follow up in one week or sooner if symptoms worsen. Patient verbalized agreement and understanding of treatment plan.  P2:  hand washing and cover cough   No evidence of systemic bacterial infection, non toxic and well  hydrated.  I do not see where any further testing or imaging is necessary at this time.   I will suggest supportive care, rest, good hygiene and encourage the patient to take adequate fluids.  The patient is to return to clinic or EMERGENCY ROOM if symptoms worsen or change significantly.  Exitcare handout on sinusitis given to patient.  Patient verbalized agreement and understanding of treatment plan and had no further questions at this time.   P2:  Hand washing and cover cough    Olen Cordial, NP 07/27/15 0930

## 2015-07-27 NOTE — ED Notes (Signed)
Patient c/o sinus congestion and pressure and nasal congestion for 4 days.

## 2015-08-14 DIAGNOSIS — J3089 Other allergic rhinitis: Secondary | ICD-10-CM | POA: Insufficient documentation

## 2015-08-15 ENCOUNTER — Other Ambulatory Visit: Payer: Self-pay | Admitting: Internal Medicine

## 2015-08-15 DIAGNOSIS — R921 Mammographic calcification found on diagnostic imaging of breast: Secondary | ICD-10-CM

## 2015-08-15 DIAGNOSIS — Z1231 Encounter for screening mammogram for malignant neoplasm of breast: Secondary | ICD-10-CM

## 2015-08-21 DIAGNOSIS — M545 Low back pain, unspecified: Secondary | ICD-10-CM | POA: Insufficient documentation

## 2015-08-21 DIAGNOSIS — G8929 Other chronic pain: Secondary | ICD-10-CM | POA: Insufficient documentation

## 2015-08-21 DIAGNOSIS — M79605 Pain in left leg: Secondary | ICD-10-CM | POA: Insufficient documentation

## 2015-08-21 DIAGNOSIS — M533 Sacrococcygeal disorders, not elsewhere classified: Secondary | ICD-10-CM

## 2015-10-10 ENCOUNTER — Emergency Department: Payer: Medicare HMO

## 2015-10-10 ENCOUNTER — Encounter: Payer: Self-pay | Admitting: *Deleted

## 2015-10-10 ENCOUNTER — Emergency Department
Admission: EM | Admit: 2015-10-10 | Discharge: 2015-10-10 | Disposition: A | Discharge status: Home / Self Care | Payer: Medicare HMO | Attending: Emergency Medicine | Admitting: Emergency Medicine

## 2015-10-10 DIAGNOSIS — Z79899 Other long term (current) drug therapy: Secondary | ICD-10-CM | POA: Insufficient documentation

## 2015-10-10 DIAGNOSIS — E119 Type 2 diabetes mellitus without complications: Secondary | ICD-10-CM | POA: Diagnosis not present

## 2015-10-10 DIAGNOSIS — Z7951 Long term (current) use of inhaled steroids: Secondary | ICD-10-CM | POA: Diagnosis not present

## 2015-10-10 DIAGNOSIS — I10 Essential (primary) hypertension: Secondary | ICD-10-CM | POA: Diagnosis not present

## 2015-10-10 DIAGNOSIS — R519 Headache, unspecified: Secondary | ICD-10-CM

## 2015-10-10 DIAGNOSIS — R270 Ataxia, unspecified: Secondary | ICD-10-CM | POA: Diagnosis not present

## 2015-10-10 DIAGNOSIS — Z87891 Personal history of nicotine dependence: Secondary | ICD-10-CM | POA: Diagnosis not present

## 2015-10-10 DIAGNOSIS — R51 Headache: Secondary | ICD-10-CM

## 2015-10-10 HISTORY — DX: Type 2 diabetes mellitus without complications: E11.9

## 2015-10-10 HISTORY — DX: Disorder of thyroid, unspecified: E07.9

## 2015-10-10 HISTORY — DX: Disorder of kidney and ureter, unspecified: N28.9

## 2015-10-10 HISTORY — DX: Chronic obstructive pulmonary disease, unspecified: J44.9

## 2015-10-10 LAB — TROPONIN I: Troponin I: <0.03 ng/mL (ref <0.031)

## 2015-10-10 LAB — CBC WITH DIFFERENTIAL/PLATELET
Basophils Absolute: 0.2 10*3/uL — ABNORMAL HIGH (ref 0–0.1)
Basophils Relative: 2 %
Eosinophils Absolute: 0.3 10*3/uL (ref 0–0.7)
Eosinophils Relative: 3 %
HCT: 39.8 % (ref 35.0–47.0)
HEMOGLOBIN: 12.9 g/dL (ref 12.0–16.0)
LYMPHS PCT: 14 %
Lymphs Abs: 1.4 10*3/uL (ref 1.0–3.6)
MCH: 28.4 pg (ref 26.0–34.0)
MCHC: 32.5 g/dL (ref 32.0–36.0)
MCV: 87.5 fL (ref 80.0–100.0)
MONO ABS: 1.1 10*3/uL — AB (ref 0.2–0.9)
MONOS PCT: 11 %
NEUTROS PCT: 70 %
Neutro Abs: 7.1 10*3/uL — ABNORMAL HIGH (ref 1.4–6.5)
PLATELETS: 206 10*3/uL (ref 150–440)
RBC: 4.55 MIL/uL (ref 3.80–5.20)
RDW: 14.3 % (ref 11.5–14.5)
WBC: 10.2 10*3/uL (ref 3.6–11.0)

## 2015-10-10 LAB — URINALYSIS COMPLETE WITH MICROSCOPIC (ARMC ONLY)
Bilirubin Urine: NEGATIVE
Glucose, UA: NEGATIVE mg/dL
HGB URINE DIPSTICK: NEGATIVE
Ketones, ur: NEGATIVE mg/dL
Nitrite: NEGATIVE
PH: 7 (ref 5.0–8.0)
PROTEIN: NEGATIVE mg/dL
SPECIFIC GRAVITY, URINE: 1.008 (ref 1.005–1.030)
SQUAMOUS EPITHELIAL / LPF: NONE SEEN

## 2015-10-10 LAB — BASIC METABOLIC PANEL
Anion gap: 11 (ref 5–15)
BUN: 19 mg/dL (ref 6–20)
CHLORIDE: 98 mmol/L — AB (ref 101–111)
CO2: 28 mmol/L (ref 22–32)
CREATININE: 1.2 mg/dL — AB (ref 0.44–1.00)
Calcium: 8.9 mg/dL (ref 8.9–10.3)
GFR calc Af Amer: 51 mL/min — ABNORMAL LOW (ref >60)
GFR calc non Af Amer: 44 mL/min — ABNORMAL LOW (ref >60)
GLUCOSE: 121 mg/dL — AB (ref 65–99)
POTASSIUM: 3.2 mmol/L — AB (ref 3.5–5.1)
Sodium: 137 mmol/L (ref 135–145)

## 2015-10-10 MED ORDER — LORAZEPAM 2 MG/ML IJ SOLN
INTRAMUSCULAR | Status: AC
  Administered 2015-10-10: 1 mg via INTRAVENOUS
  Filled 2015-10-10: qty 1

## 2015-10-10 MED ORDER — CEPHALEXIN 500 MG PO CAPS
ORAL_CAPSULE | ORAL | Status: AC
  Filled 2015-10-10: qty 1

## 2015-10-10 MED ORDER — AMLODIPINE BESYLATE 5 MG PO TABS
ORAL_TABLET | ORAL | Status: AC
  Administered 2015-10-10: 5 mg via ORAL
  Filled 2015-10-10: qty 1

## 2015-10-10 MED ORDER — CEPHALEXIN 500 MG PO CAPS
500.0000 mg | ORAL_CAPSULE | Freq: Once | ORAL | Status: AC
  Administered 2015-10-10: 500 mg via ORAL

## 2015-10-10 MED ORDER — LORAZEPAM 2 MG/ML IJ SOLN
1.0000 mg | Freq: Once | INTRAMUSCULAR | Status: AC
  Administered 2015-10-10: 1 mg via INTRAVENOUS

## 2015-10-10 MED ORDER — AMLODIPINE BESYLATE 5 MG PO TABS
5.0000 mg | ORAL_TABLET | Freq: Once | ORAL | Status: AC
  Administered 2015-10-10: 5 mg via ORAL

## 2015-10-10 MED ORDER — BUTALBITAL-APAP-CAFFEINE 50-325-40 MG PO TABS
1.0000 | ORAL_TABLET | Freq: Once | ORAL | Status: AC
Start: 2015-10-10 — End: 2015-10-10
  Administered 2015-10-10: 1 via ORAL

## 2015-10-10 MED ORDER — AMLODIPINE BESYLATE 5 MG PO TABS: 5.0000 mg | ORAL_TABLET | Freq: Every day | ORAL | Status: DC

## 2015-10-10 MED ORDER — BUTALBITAL-APAP-CAFFEINE 50-325-40 MG PO TABS
ORAL_TABLET | ORAL | Status: AC
  Administered 2015-10-10: 1 via ORAL
  Filled 2015-10-10: qty 1

## 2015-10-10 NOTE — ED Notes (Signed)
States high blood pressure for 2 weeks, states BP at home 225/110, pt states headache and dizziness, denies chest pain

## 2015-10-10 NOTE — ED Provider Notes (Signed)
[facility] Emergency Department Provider Note  ____________________________________________  Time seen: Approximately 520 PM  I have reviewed the triage vital signs and the nursing notes.   HISTORY  Chief Complaint Hypertension    HPI Tamara Finley is a 75 y.o. female with a history of hypertension and diabetes who is presenting today with elevated blood pressure as well as a left-sided sharp headache and dizziness over the past week. She says that she is compliant with all of her medications. She says that the headache is about an 8 out of 10 to the left side of her head it has been ongoing over the past week. The headache is described as a stabbing pain. It has been associated with dizziness especially when walking. She says that she feels off balance and that she is leaning to the right. Denies any chest pain or shortness of breath. Takes aspirin daily.Denies any neck pain.   Past Medical History  Diagnosis Date  . Hypertension   . Diabetes mellitus without complication ([place])   . COPD (chronic obstructive pulmonary disease) ([place])   . Renal disorder   . Thyroid disease     There are no active problems to display for this patient.   Past Surgical History  Procedure Laterality Date  . Cardiac surgery    . Abdominal aortic aneurysm repair      Current Outpatient Rx  Name  Route  Sig  Dispense  Refill  . albuterol (PROVENTIL HFA;VENTOLIN HFA) 108 (90 BASE) MCG/ACT inhaler   Inhalation   Inhale 2 puffs into the lungs every 6 (six) hours as needed for wheezing or shortness of breath.         . budesonide-formoterol (SYMBICORT) 160-4.5 MCG/ACT inhaler   Inhalation   Inhale 2 puffs into the lungs 2 (two) times daily.         . Cholecalciferol (VITAMIN D3) 10000 UNITS TABS   Oral   Take 1 tablet by mouth 2 (two) times daily.         Marland Kitchen doxepin (SINEQUAN) 25 MG capsule   Oral   Take 25 mg by mouth at bedtime.         Marland Kitchen EXPIRED:  fluticasone (FLONASE) 50 MCG/ACT nasal spray   Each Nare   Place 1 spray into both nostrils 2 (two) times daily.   16 g   0   . hydrochlorothiazide (HYDRODIURIL) 25 MG tablet   Oral   Take 25 mg by mouth daily.         Marland Kitchen levothyroxine (SYNTHROID, LEVOTHROID) 88 MCG tablet   Oral   Take 88 mcg by mouth daily before breakfast.         . Magnesium 250 MG TABS   Oral   Take 1 tablet by mouth daily.         . metoprolol succinate (TOPROL-XL) 50 MG 24 hr tablet   Oral   Take 50 mg by mouth daily. Take with or immediately following a meal.         . omega-3 acid ethyl esters (LOVAZA) 1 G capsule   Oral   Take 1 g by mouth daily.         . pantoprazole (PROTONIX) 40 MG tablet   Oral   Take 40 mg by mouth daily.         . simvastatin (ZOCOR) 80 MG tablet   Oral   Take 80 mg by mouth daily.         Marland Kitchen  sodium chloride (OCEAN) 0.65 % SOLN nasal spray   Each Nare   Place 2 sprays into both nostrils every 2 (two) hours while awake.      0   . traMADol (ULTRAM) 50 MG tablet   Oral   Take 50 mg by mouth every 6 (six) hours as needed.           Allergies Review of patient's allergies indicates no known allergies.  History reviewed. No pertinent family history.  Social History Social History  Substance Use Topics  . Smoking status: Former Research scientist (life sciences)  . Smokeless tobacco: None  . Alcohol Use: No    Review of Systems Constitutional: No fever/chills Eyes: No visual changes. ENT: No sore throat. Cardiovascular: Denies chest pain. Respiratory: Denies shortness of breath. Gastrointestinal: No abdominal pain.  No nausea, no vomiting.  No diarrhea.  No constipation. Genitourinary: Negative for dysuria. Musculoskeletal: Negative for back pain. Skin: Negative for rash. Neurological: Negative for focal weakness or numbness.  10-point ROS otherwise negative.  ____________________________________________   PHYSICAL EXAM:  VITAL SIGNS: ED Triage Vitals  Enc  Vitals Group     BP 10/10/15 1654 204/78 mmHg     Pulse Rate 10/10/15 1654 65     Resp 10/10/15 1654 18     Temp 10/10/15 1654 98.4 F (36.9 C)     Temp Source 10/10/15 1654 Oral     SpO2 10/10/15 1654 96 %     Weight 10/10/15 1654 184 lb (83.462 kg)     Height 10/10/15 1654 5\' 4"  (1.626 m)     Head Cir --      Peak Flow --      Pain Score 10/10/15 1655 8     Pain Loc --      Pain Edu? --      Excl. in [place]? --     Constitutional: Alert and oriented. Well appearing and in no acute distress. Eyes: Conjunctivae are normal. PERRL. EOMI. Head: Atraumatic. Nose: No congestion/rhinnorhea. Mouth/Throat: Mucous membranes are moist.  Oropharynx non-erythematous. Neck: No stridor.   Cardiovascular: Normal rate, regular rhythm. Grossly normal heart sounds.  Good peripheral circulation. Respiratory: Normal respiratory effort.  No retractions. Lungs CTAB. Gastrointestinal: Soft and nontender. No distention. No abdominal bruits. No CVA tenderness. Musculoskeletal: No lower extremity tenderness nor edema.  No joint effusions. Neurologic:  Normal speech and language.  left upper extremity ataxia as well as an ataxic gait.  Skin:  Skin is warm, dry and intact. No rash noted. Psychiatric: Mood and affect are normal. Speech and behavior are normal.  NIH Stroke Scale  Person Administering Scale: Doran Stabler  Administer stroke scale items in the order listed. Record performance in each category after each subscale exam. Do not go back and change scores. Follow directions provided for each exam technique. Scores should reflect what the patient does, not what the clinician thinks the patient can do. The clinician should record answers while administering the exam and work quickly. Except where indicated, the patient should not be coached (i.e., repeated requests to patient to make a special effort).   1a  Level of consciousness: 0=alert; keenly responsive  1b. LOC questions:  0=Performs both  tasks correctly  1c. LOC commands: 0=Performs both tasks correctly  2.  Best Gaze: 0=normal  3.  Visual: 0=No visual loss  4. Facial Palsy: 0=Normal symmetric movement  5a.  Motor left arm: 0=No drift, limb holds 90 (or 45) degrees for full 10 seconds  5b.  Motor right arm: 0=No drift, limb holds 90 (or 45) degrees for full 10 seconds  6a. motor left leg: 0=No drift, limb holds 90 (or 45) degrees for full 10 seconds  6b  Motor right leg:  0=No drift, limb holds 90 (or 45) degrees for full 10 seconds  7. Limb Ataxia: 1=Present in one limb  8.  Sensory: 0=Normal; no sensory loss  9. Best Language:  0=No aphasia, normal  10. Dysarthria: 0=Normal  11. Extinction and Inattention: 0=No abnormality  12. Distal motor function: 0=Normal   Total:   1   ____________________________________________   LABS (all labs ordered are listed, but only abnormal results are displayed)  Labs Reviewed  CBC WITH DIFFERENTIAL/PLATELET - Abnormal; Notable for the following:    Neutro Abs 7.1 (*)    Monocytes Absolute 1.1 (*)    Basophils Absolute 0.2 (*)    All other components within normal limits  BASIC METABOLIC PANEL - Abnormal; Notable for the following:    Potassium 3.2 (*)    Chloride 98 (*)    Glucose, Bld 121 (*)    Creatinine, Ser 1.20 (*)    GFR calc non Af Amer 44 (*)    GFR calc Af Amer 51 (*)    All other components within normal limits  URINALYSIS COMPLETEWITH MICROSCOPIC ([facility] ONLY) - Abnormal; Notable for the following:    Color, Urine YELLOW (*)    APPearance CLEAR (*)    Leukocytes, UA 1+ (*)    Bacteria, UA RARE (*)    All other components within normal limits  URINE CULTURE  TROPONIN I   ____________________________________________  EKG  ED ECG REPORT I, Doran Stabler, the attending physician, personally viewed and interpreted this ECG.   Date: 10/10/2015  EKG Time: 1731  Rate: 66  Rhythm: normal sinus rhythm  Axis: Left axis deviation  Intervals:none  ST&T  Change: No ST segment elevation or depression. No abnormal T-wave inversion.  ____________________________________________  RADIOLOGY  CAT scan of the brain without any acute abnormality.   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 _______________________________________   PROCEDURES   ____________________________________________   INITIAL IMPRESSION / ASSESSMENT AND PLAN / ED COURSE  Pertinent labs & imaging results that were available during my care of the patient were reviewed by me and considered in my medical decision making (see chart for details).  Symptoms are ongoing 1 week. Well out of the window for TPA.   ----------------------------------------- 9:43 PM on 10/10/2015 -----------------------------------------  Patient given copy of MRI to take with her to follow-up appointment with her primary care doctor. I gave her a 5 mg dose of amlodipine and her pressure is now decreased to 183/101. She feels some improvement in her headache as well as her dizziness. Her gait is normal when she is walking unassisted however she still says she does feel some dizziness. She says she is feeling well enough to go home. No stroke found on her MRI. She will follow up with her primary care doctor early next week on Monday or Tuesday. We'll discharge with amlodipine to start in addition to her current medications. Unclear cause of headache ongoing for about a week now. Friend likely to be subarachnoid hemorrhage. No sudden onset thunderclap quality. ____________________________________________   FINAL CLINICAL IMPRESSION(S) / ED DIAGNOSES  Hypertension. Ataxia. Headache.     Orbie Pyo, MD 10/10/15 [phone]

## 2015-10-10 NOTE — ED Notes (Signed)
Pt a/o, vss except HTN. Headache right head 6/10. Lung sounds clear.

## 2015-10-10 NOTE — Discharge Instructions (Signed)
General Headache Without Cause °A headache is pain or discomfort felt around the head or neck area. There are many causes and types of headaches. In some cases, the cause may not be found.  °HOME CARE  °Managing Pain °· Take over-the-counter and prescription medicines only as told by your doctor. °· Lie down in a dark, quiet room when you have a headache. °· If directed, apply ice to the head and neck area: °· Put ice in a plastic bag. °· Place a towel between your skin and the bag. °· Leave the ice on for 20 minutes, 2-3 times per day. °· Use a heating pad or hot shower to apply heat to the head and neck area as told by your doctor. °· Keep lights dim if bright lights bother you or make your headaches worse. °Eating and Drinking °· Eat meals on a regular schedule. °· Lessen how much alcohol you drink. °· Lessen how much caffeine you drink, or stop drinking caffeine. °General Instructions °· Keep all follow-up visits as told by your doctor. This is important. °· Keep a journal to find out if certain things bring on headaches. For example, write down: °· What you eat and drink. °· How much sleep you get. °· Any change to your diet or medicines. °· Relax by getting a massage or doing other relaxing activities. °· Lessen stress. °· Sit up straight. Do not tighten (tense) your muscles. °· Do not use tobacco products. This includes cigarettes, chewing tobacco, or e-cigarettes. If you need help quitting, ask your doctor. °· Exercise regularly as told by your doctor. °· Get enough sleep. This often means 7-9 hours of sleep. °GET HELP IF: °· Your symptoms are not helped by medicine. °· You have a headache that feels different than the other headaches. °· You feel sick to your stomach (nauseous) or you throw up (vomit). °· You have a fever. °GET HELP RIGHT AWAY IF:  °· Your headache becomes really bad. °· You keep throwing up. °· You have a stiff neck. °· You have trouble seeing. °· You have trouble speaking. °· You have  pain in the eye or ear. °· Your muscles are weak or you lose muscle control. °· You lose your balance or have trouble walking. °· You feel like you will pass out (faint) or you pass out. °· You have confusion. °  °This information is not intended to replace advice given to you by your health care provider. Make sure you discuss any questions you have with your health care provider. °  °Document Released: 05/27/2008 Document Revised: 05/09/2015 Document Reviewed: 12/11/2014 °Elsevier Interactive Patient Education ©2016 [employer] ° °Hypertension °Hypertension, commonly called high blood pressure, is when the force of blood pumping through your arteries is too strong. Your arteries are the blood vessels that carry blood from your heart throughout your body. A blood pressure reading consists of a higher number over a lower number, such as 110/72. The higher number (systolic) is the pressure inside your arteries when your heart pumps. The lower number (diastolic) is the pressure inside your arteries when your heart relaxes. Ideally you want your blood pressure below 120/80. °Hypertension forces your heart to work harder to pump blood. Your arteries may become narrow or stiff. Having untreated or uncontrolled hypertension can cause heart attack, stroke, kidney disease, and other problems. °RISK FACTORS °Some risk factors for high blood pressure are controllable. Others are not.  °Risk factors you cannot control include:  °· Race. You may   be at higher risk if you are African American. °· Age. Risk increases with age. °· Gender. Men are at higher risk than women before age 45 years. After age 65, women are at higher risk than men. °Risk factors you can control include: °· Not getting enough exercise or physical activity. °· Being overweight. °· Getting too much fat, sugar, calories, or salt in your diet. °· Drinking too much alcohol. °SIGNS AND SYMPTOMS °Hypertension does not usually cause signs or symptoms. Extremely  high blood pressure (hypertensive crisis) may cause headache, anxiety, shortness of breath, and nosebleed. °DIAGNOSIS °To check if you have hypertension, your health care provider will measure your blood pressure while you are seated, with your arm held at the level of your heart. It should be measured at least twice using the same arm. Certain conditions can cause a difference in blood pressure between your right and left arms. A blood pressure reading that is higher than normal on one occasion does not mean that you need treatment. If it is not clear whether you have high blood pressure, you may be asked to return on a different day to have your blood pressure checked again. Or, you may be asked to monitor your blood pressure at home for 1 or more weeks. °TREATMENT °Treating high blood pressure includes making lifestyle changes and possibly taking medicine. Living a healthy lifestyle can help lower high blood pressure. You may need to change some of your habits. °Lifestyle changes may include: °· Following the DASH diet. This diet is high in fruits, vegetables, and whole grains. It is low in salt, red meat, and added sugars. °· Keep your sodium intake below 2,300 mg per day. °· Getting at least 30-45 minutes of aerobic exercise at least 4 times per week. °· Losing weight if necessary. °· Not smoking. °· Limiting alcoholic beverages. °· Learning ways to reduce stress. °Your health care provider may prescribe medicine if lifestyle changes are not enough to get your blood pressure under control, and if one of the following is true: °· You are 18-59 years of age and your systolic blood pressure is above 140. °· You are 60 years of age or older, and your systolic blood pressure is above 150. °· Your diastolic blood pressure is above 90. °· You have diabetes, and your systolic blood pressure is over 140 or your diastolic blood pressure is over 90. °· You have kidney disease and your blood pressure is above  140/90. °· You have heart disease and your blood pressure is above 140/90. °Your personal target blood pressure may vary depending on your medical conditions, your age, and other factors. °HOME CARE INSTRUCTIONS °· Have your blood pressure rechecked as directed by your health care provider.   °· Take medicines only as directed by your health care provider. Follow the directions carefully. Blood pressure medicines must be taken as prescribed. The medicine does not work as well when you skip doses. Skipping doses also puts you at risk for problems. °· Do not smoke.   °· Monitor your blood pressure at home as directed by your health care provider.  °SEEK MEDICAL CARE IF:  °· You think you are having a reaction to medicines taken. °· You have recurrent headaches or feel dizzy. °· You have swelling in your ankles. °· You have trouble with your vision. °SEEK IMMEDIATE MEDICAL CARE IF: °· You develop a severe headache or confusion. °· You have unusual weakness, numbness, or feel faint. °· You have severe chest or abdominal pain. °·   You vomit repeatedly. °· You have trouble breathing. °MAKE SURE YOU:  °· Understand these instructions. °· Will watch your condition. °· Will get help right away if you are not doing well or get worse. °  °This information is not intended to replace advice given to you by your health care provider. Make sure you discuss any questions you have with your health care provider. °  °Document Released: 08/18/2005 Document Revised: 01/02/2015 Document Reviewed: 06/10/2013 °Elsevier Interactive Patient Education ©2016 [employer] ° °

## 2015-10-10 NOTE — ED Notes (Signed)
Pt oob to BR with 1 assist. C/o being dizzy

## 2015-10-10 NOTE — ED Notes (Signed)
Called lab to add on urine culture. They stated they would add it to urine sent down earlier.

## 2015-10-12 ENCOUNTER — Emergency Department
Admission: EM | Admit: 2015-10-12 | Discharge: 2015-10-12 | Disposition: A | Discharge status: Home / Self Care | Payer: Medicare HMO | Attending: Emergency Medicine | Admitting: Emergency Medicine

## 2015-10-12 ENCOUNTER — Emergency Department: Payer: Medicare HMO

## 2015-10-12 DIAGNOSIS — I10 Essential (primary) hypertension: Secondary | ICD-10-CM

## 2015-10-12 DIAGNOSIS — R0902 Hypoxemia: Secondary | ICD-10-CM

## 2015-10-12 DIAGNOSIS — R519 Headache, unspecified: Secondary | ICD-10-CM

## 2015-10-12 DIAGNOSIS — E119 Type 2 diabetes mellitus without complications: Secondary | ICD-10-CM | POA: Insufficient documentation

## 2015-10-12 DIAGNOSIS — Z87891 Personal history of nicotine dependence: Secondary | ICD-10-CM | POA: Insufficient documentation

## 2015-10-12 DIAGNOSIS — R51 Headache: Secondary | ICD-10-CM | POA: Diagnosis present

## 2015-10-12 DIAGNOSIS — Z7951 Long term (current) use of inhaled steroids: Secondary | ICD-10-CM | POA: Insufficient documentation

## 2015-10-12 DIAGNOSIS — Z79899 Other long term (current) drug therapy: Secondary | ICD-10-CM | POA: Diagnosis not present

## 2015-10-12 LAB — CBC
HCT: 42.3 % (ref 35.0–47.0)
HEMOGLOBIN: 14 g/dL (ref 12.0–16.0)
MCH: 28.6 pg (ref 26.0–34.0)
MCHC: 33.1 g/dL (ref 32.0–36.0)
MCV: 86.5 fL (ref 80.0–100.0)
Platelets: 210 10*3/uL (ref 150–440)
RBC: 4.89 MIL/uL (ref 3.80–5.20)
RDW: 14.2 % (ref 11.5–14.5)
WBC: 10.6 10*3/uL (ref 3.6–11.0)

## 2015-10-12 LAB — APTT: APTT: 30 s (ref 24–36)

## 2015-10-12 LAB — BASIC METABOLIC PANEL
ANION GAP: 12 (ref 5–15)
BUN: 25 mg/dL — ABNORMAL HIGH (ref 6–20)
CALCIUM: 9.4 mg/dL (ref 8.9–10.3)
CO2: 28 mmol/L (ref 22–32)
Chloride: 97 mmol/L — ABNORMAL LOW (ref 101–111)
Creatinine, Ser: 1.21 mg/dL — ABNORMAL HIGH (ref 0.44–1.00)
GFR, EST AFRICAN AMERICAN: 50 mL/min — AB (ref >60)
GFR, EST NON AFRICAN AMERICAN: 43 mL/min — AB (ref >60)
Glucose, Bld: 133 mg/dL — ABNORMAL HIGH (ref 65–99)
Potassium: 3.2 mmol/L — ABNORMAL LOW (ref 3.5–5.1)
Sodium: 137 mmol/L (ref 135–145)

## 2015-10-12 LAB — SEDIMENTATION RATE: Sed Rate: 23 mm/hr (ref 0–30)

## 2015-10-12 LAB — PROTIME-INR
INR: 1.19
PROTHROMBIN TIME: 15.3 s — AB (ref 11.4–15.0)

## 2015-10-12 LAB — TROPONIN I: TROPONIN I: 0.03 ng/mL (ref <0.031)

## 2015-10-12 MED ORDER — MORPHINE SULFATE (PF) 2 MG/ML IV SOLN
2.0000 mg | Freq: Once | INTRAVENOUS | Status: AC
  Administered 2015-10-12: 2 mg via INTRAVENOUS
  Filled 2015-10-12: qty 1

## 2015-10-12 MED ORDER — ONDANSETRON HCL 4 MG/2ML IJ SOLN
4.0000 mg | Freq: Once | INTRAMUSCULAR | Status: AC
  Administered 2015-10-12: 4 mg via INTRAVENOUS
  Filled 2015-10-12: qty 2

## 2015-10-12 MED ORDER — LABETALOL HCL 5 MG/ML IV SOLN: 10.0000 mg | INTRAVENOUS | Status: DC

## 2015-10-12 MED ORDER — METOPROLOL TARTRATE 1 MG/ML IV SOLN
5.0000 mg | Freq: Once | INTRAVENOUS | Status: AC
  Administered 2015-10-12: 5 mg via INTRAVENOUS
  Filled 2015-10-12: qty 5

## 2015-10-12 MED ORDER — CLONIDINE HCL 0.1 MG PO TABS
ORAL_TABLET | ORAL | Status: AC
  Filled 2015-10-12: qty 1

## 2015-10-12 MED ORDER — GADOBENATE DIMEGLUMINE 529 MG/ML IV SOLN: 20.0000 mL | Freq: Once | INTRAVENOUS | Status: DC | PRN

## 2015-10-12 MED ORDER — LORAZEPAM 2 MG/ML IJ SOLN
2.0000 mg | Freq: Once | INTRAMUSCULAR | Status: AC
  Administered 2015-10-12: 1 mg via INTRAVENOUS
  Filled 2015-10-12: qty 1

## 2015-10-12 MED ORDER — AMLODIPINE BESYLATE 2.5 MG PO TABS
2.5000 mg | ORAL_TABLET | Freq: Every day | ORAL | Status: DC
Start: 2015-10-12 — End: 2015-10-18

## 2015-10-12 MED ORDER — HYDRALAZINE HCL 20 MG/ML IJ SOLN: 5.0000 mg | Freq: Once | INTRAMUSCULAR | Status: DC

## 2015-10-12 MED ORDER — HYDRALAZINE HCL 20 MG/ML IJ SOLN
5.0000 mg | Freq: Once | INTRAMUSCULAR | Status: AC
  Administered 2015-10-12: 5 mg via INTRAVENOUS
  Filled 2015-10-12: qty 1

## 2015-10-12 MED ORDER — AMLODIPINE BESYLATE 5 MG PO TABS
2.5000 mg | ORAL_TABLET | Freq: Once | ORAL | Status: AC
  Administered 2015-10-12: 2.5 mg via ORAL
  Filled 2015-10-12: qty 1

## 2015-10-12 MED ORDER — CLONIDINE HCL 0.1 MG PO TABS
0.1000 mg | ORAL_TABLET | Freq: Once | ORAL | Status: AC
  Administered 2015-10-12: 0.1 mg via ORAL

## 2015-10-12 NOTE — ED Notes (Signed)
MRI notified that patient has orders for IV Ativan to be given prior to procedure.

## 2015-10-12 NOTE — ED Notes (Signed)
MD Owens Shark notified of pts BP

## 2015-10-12 NOTE — ED Provider Notes (Signed)
[facility] Emergency Department Provider Note  ____________________________________________  Time seen: 4:00 AM  I have reviewed the triage vital signs and the nursing notes.   HISTORY  Chief Complaint Headache     HPI Tamara Finley is a 76 y.o. female with history of hypertension diabetes and COPD presents with complaint of headaches 2 weeks and hypertension. Of note the patient was seen 10/08/2014 by Dr. Dorthula Perfect her primary care provider with similar complaint patient and husband states that no intervention or medication changes occurred at that time. Patient was subsequently seen on 10/10/2015 in the emergency department at [facility] with same complaint. At that time the patient had a CT scan of the head performed as well as an MRI which revealed no acute abnormalities. Patient's blood pressure on arrival to the emergency department today 194/91. Of note patient was prescribed amlodipine on Wednesday by Dr. Dineen Kid in the emergency part. Patient states her current pain score is 8 out of 10 and located frontal and occipital portion of her head. Patient denies any weakness or numbness no nausea or vomiting or gait instability or visual changes. Pain is described as stabbing sensation.     Past Medical History  Diagnosis Date  . Hypertension   . Diabetes mellitus without complication ([place])   . COPD (chronic obstructive pulmonary disease) ([place])   . Renal disorder   . Thyroid disease     There are no active problems to display for this patient.   Past Surgical History  Procedure Laterality Date  . Cardiac surgery    . Abdominal aortic aneurysm repair      Current Outpatient Rx  Name  Route  Sig  Dispense  Refill  . albuterol (PROVENTIL HFA;VENTOLIN HFA) 108 (90 BASE) MCG/ACT inhaler   Inhalation   Inhale 2 puffs into the lungs every 6 (six) hours as needed for wheezing or shortness of breath.         Marland Kitchen amLODipine (NORVASC) 5 MG tablet   Oral   Take  1 tablet (5 mg total) by mouth daily.   15 tablet   0   . budesonide-formoterol (SYMBICORT) 160-4.5 MCG/ACT inhaler   Inhalation   Inhale 2 puffs into the lungs 2 (two) times daily.         . Cholecalciferol (VITAMIN D3) 10000 UNITS TABS   Oral   Take 1 tablet by mouth 2 (two) times daily.         Marland Kitchen doxepin (SINEQUAN) 25 MG capsule   Oral   Take 25 mg by mouth at bedtime.         Marland Kitchen EXPIRED: fluticasone (FLONASE) 50 MCG/ACT nasal spray   Each Nare   Place 1 spray into both nostrils 2 (two) times daily.   16 g   0   . hydrochlorothiazide (HYDRODIURIL) 25 MG tablet   Oral   Take 25 mg by mouth daily.         Marland Kitchen levothyroxine (SYNTHROID, LEVOTHROID) 88 MCG tablet   Oral   Take 88 mcg by mouth daily before breakfast.         . Magnesium 250 MG TABS   Oral   Take 1 tablet by mouth daily.         . metoprolol succinate (TOPROL-XL) 50 MG 24 hr tablet   Oral   Take 50 mg by mouth daily. Take with or immediately following a meal.         . omega-3 acid ethyl esters (LOVAZA)  1 G capsule   Oral   Take 1 g by mouth daily.         . pantoprazole (PROTONIX) 40 MG tablet   Oral   Take 40 mg by mouth daily.         . simvastatin (ZOCOR) 80 MG tablet   Oral   Take 80 mg by mouth daily.         . sodium chloride (OCEAN) 0.65 % SOLN nasal spray   Each Nare   Place 2 sprays into both nostrils every 2 (two) hours while awake.      0   . traMADol (ULTRAM) 50 MG tablet   Oral   Take 50 mg by mouth every 6 (six) hours as needed.           Allergies No known drug allergies  No family history on file.  Social History Social History  Substance Use Topics  . Smoking status: Former Research scientist (life sciences)  . Smokeless tobacco: Not on file  . Alcohol Use: No    Review of Systems  Constitutional: Negative for fever. Eyes: Negative for visual changes. ENT: Negative for sore throat. Cardiovascular: Negative for chest pain. Respiratory: Negative for shortness of  breath. Gastrointestinal: Negative for abdominal pain, vomiting and diarrhea. Genitourinary: Negative for dysuria. Musculoskeletal: Negative for back pain. Skin: Negative for rash. Neurological: Positive for headache.   10-point ROS otherwise negative.  ____________________________________________   PHYSICAL EXAM:  VITAL SIGNS: ED Triage Vitals  Enc Vitals Group     BP 10/12/15 0343 194/91 mmHg     Pulse Rate 10/12/15 0343 70     Resp 10/12/15 0343 18     Temp 10/12/15 0343 98.2 F (36.8 C)     Temp Source 10/12/15 0343 Oral     SpO2 10/12/15 0343 93 %     Weight 10/12/15 0343 184 lb (83.462 kg)     Height 10/12/15 0343 5\' 4"  (1.626 m)     Head Cir --      Peak Flow --      Pain Score 10/12/15 0343 9     Pain Loc --      Pain Edu? --      Excl. in [place]? --      Constitutional: Alert and oriented. Well appearing and in no distress. Eyes: Conjunctivae are normal. PERRL. Normal extraocular movements. ENT   Head: Normocephalic and atraumatic.   Nose: No congestion/rhinnorhea.   Mouth/Throat: Mucous membranes are moist.   Neck: No stridor. Hematological/Lymphatic/Immunilogical: No cervical lymphadenopathy. Cardiovascular: Normal rate, regular rhythm. Normal and symmetric distal pulses are present in all extremities. No murmurs, rubs, or gallops. Respiratory: Normal respiratory effort without tachypnea nor retractions. Breath sounds are clear and equal bilaterally. No wheezes/rales/rhonchi. Gastrointestinal: Soft and nontender. No distention. There is no CVA tenderness. Genitourinary: deferred Musculoskeletal: Nontender with normal range of motion in all extremities. No joint effusions.  No lower extremity tenderness nor edema. Neurologic:  Normal speech and language. No gross focal neurologic deficits are appreciated. Speech is normal.  Skin:  Skin is warm, dry and intact. No rash noted. Psychiatric: Mood and affect are normal. Speech and behavior are normal.  Patient exhibits appropriate insight and judgment.  ____________________________________________    LABS (pertinent positives/negatives)  Labs Reviewed  BASIC METABOLIC PANEL  CBC  TROPONIN I     ____________________________________________   EKG  ED ECG REPORT I, BROWN, Brawley N, the attending physician, personally viewed and interpreted this ECG.   Date: 10/12/2015  EKG Time: 5:42 AM  Rate: 70  Rhythm: Normal sinus rhythm  Axis: Normal  Intervals: Normal  ST&T Change: none   ____________________________________________    RADIOLOGY   DG Chest Port 1 View (Final result) Result time: 10/12/15 05:20:59   Final result by Rad Results In Interface (10/12/15 05:20:59)   Narrative:   CLINICAL DATA: Subacute onset of headache for 2 weeks. Initial encounter.  EXAM: PORTABLE CHEST 1 VIEW  COMPARISON: Chest radiograph performed 07/27/2015  FINDINGS: The lungs are well-aerated. Mild vascular congestion is noted. Mild bibasilar opacities likely reflect atelectasis. There is no evidence of pleural effusion or pneumothorax.  The cardiomediastinal silhouette is borderline normal in size. No acute osseous abnormalities are seen.  IMPRESSION: Mild vascular congestion noted. Mild bibasilar opacities likely reflect atelectasis.   Electronically Signed By: Garald Balding M.D. On: 10/12/2015 05:20          MR Angiogram Neck W Wo Contrast (Final result) Result time: 10/12/15 11:38:42   Final result by Rad Results In Interface (10/12/15 11:38:42)   Narrative:   CLINICAL DATA: 75 year old female with headache for 2 weeks. Recent hypertension. Dizziness. Initial encounter.  EXAM: MRA NECK WITHOUT AND WITH CONTRAST  MRA HEAD WITHOUT CONTRAST  TECHNIQUE: Multiplanar and multiecho pulse sequences of the neck were obtained without and with intravenous contrast. Angiographic images of the neck were obtained using MRA technique without and with  intravenous contast.; Angiographic images of the Circle of Willis were obtained using MRA technique without intravenous contrast.  CONTRAST: 17 mL MultiHance  COMPARISON: Noncontrast brain MRI 10/10/2015 and earlier  FINDINGS: MRA NECK FINDINGS  Precontrast time-of-flight imaging in the neck reveals antegrade flow in both carotid and vertebral arteries. Left greater than right vertebral artery tortuosity demonstrated.  Post-contrast MRA images reveal a 3 vessel arch configuration, with great vessel origin irregularity compatible with atherosclerosis.  Irregularity of the brachiocephalic artery without hemodynamically significant stenosis. Negative right CCA origin. Moderate irregularity in the distal right CCA and at the right carotid bifurcation, but mostly affecting the right ECA origin. Right ICA origin and bulb are patent with less than 50 % stenosis with respect to the distal vessel. Tortuous right ICA with a kinked appearance distal to the bulb.  Left CCA origin irregularity and stenosis up to 50 % with respect to the distal vessel. Negative CCA elsewhere proximal to the bifurcation. At the left carotid bifurcation there is multifocal irregularity including at the left ICA origin, but not hemodynamically significant. Mildly tortuous cervical left ICA.  No proximal right subclavian artery stenosis. Normal right vertebral artery origin. Tortuous cervical right vertebral artery without stenosis.  Moderate to severe irregularity in the proximal left subclavian artery, with stenosis up to 65 % with respect to the distal vessel. Despite this, mild irregularity at the left vertebral artery origin without significant stenosis. Tortuous left V2 segment, otherwise negative cervical left vertebral artery.  MRA HEAD FINDINGS  Antegrade flow in the posterior circulation with codominant distal vertebral arteries. Bilateral PICA origins remain patent. No distal vertebral  stenosis. Patent basilar artery. Patent AICA origins. No basilar artery stenosis. Normal SCA and PCA origins. Posterior communicating arteries are diminutive or absent. Bilateral P1 segments are tortuous, bilateral PCA branches otherwise are within normal limits.  Antegrade flow in both ICA siphons. Mild siphon irregularity. No siphon stenosis.  Negative left ophthalmic artery origin and left ICA terminus.  Just distal or adjacent to the right ophthalmic artery origin there is a saccular 3 x 5 mm right ICA aneurysm (  series 2, image 84 and series 103, image 3). This is directed cephalad. Otherwise negative supra clinoid right ICA and right ICA terminus.  Normal MCA and ACA origins. Anterior communicating artery is within normal limits. Tortuous but otherwise negative visualized bilateral ACA branches. Bilateral MCA M1 segments, bifurcations, and visualized MCA branches are within normal limits ; there is mild proximal M2 ectasia at the right carotid bifurcation.  No intracranial mass effect. No ventriculomegaly.  IMPRESSION: 1. 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 Recommend Neuro-endovascular consultation. 2. Extensive great vessel origin and carotid bifurcation atherosclerosis, but the only hemodynamically significant stenosis identified is in the proximal left subclavian artery (up to 65%). 3. No intracranial stenosis.   Electronically Signed By: Genevie Ann M.D. On: 10/12/2015 11:38          MR MRA HEAD WO CONTRAST (Final result) Result time: 10/12/15 11:38:42   Procedure changed from MR Angiogram Head W Contrast      Final result by Rad Results In Interface (10/12/15 11:38:42)   Narrative:   CLINICAL DATA: 75 year old female with headache for 2 weeks. Recent hypertension. Dizziness. Initial encounter.  EXAM: MRA NECK WITHOUT AND WITH CONTRAST  MRA HEAD WITHOUT CONTRAST  TECHNIQUE: Multiplanar and multiecho pulse sequences  of the neck were obtained without and with intravenous contrast. Angiographic images of the neck were obtained using MRA technique without and with intravenous contast.; Angiographic images of the Circle of Willis were obtained using MRA technique without intravenous contrast.  CONTRAST: 17 mL MultiHance  COMPARISON: Noncontrast brain MRI 10/10/2015 and earlier  FINDINGS: MRA NECK FINDINGS  Precontrast time-of-flight imaging in the neck reveals antegrade flow in both carotid and vertebral arteries. Left greater than right vertebral artery tortuosity demonstrated.  Post-contrast MRA images reveal a 3 vessel arch configuration, with great vessel origin irregularity compatible with atherosclerosis.  Irregularity of the brachiocephalic artery without hemodynamically significant stenosis. Negative right CCA origin. Moderate irregularity in the distal right CCA and at the right carotid bifurcation, but mostly affecting the right ECA origin. Right ICA origin and bulb are patent with less than 50 % stenosis with respect to the distal vessel. Tortuous right ICA with a kinked appearance distal to the bulb.  Left CCA origin irregularity and stenosis up to 50 % with respect to the distal vessel. Negative CCA elsewhere proximal to the bifurcation. At the left carotid bifurcation there is multifocal irregularity including at the left ICA origin, but not hemodynamically significant. Mildly tortuous cervical left ICA.  No proximal right subclavian artery stenosis. Normal right vertebral artery origin. Tortuous cervical right vertebral artery without stenosis.  Moderate to severe irregularity in the proximal left subclavian artery, with stenosis up to 65 % with respect to the distal vessel. Despite this, mild irregularity at the left vertebral artery origin without significant stenosis. Tortuous left V2 segment, otherwise negative cervical left vertebral artery.  MRA HEAD  FINDINGS  Antegrade flow in the posterior circulation with codominant distal vertebral arteries. Bilateral PICA origins remain patent. No distal vertebral stenosis. Patent basilar artery. Patent AICA origins. No basilar artery stenosis. Normal SCA and PCA origins. Posterior communicating arteries are diminutive or absent. Bilateral P1 segments are tortuous, bilateral PCA branches otherwise are within normal limits.  Antegrade flow in both ICA siphons. Mild siphon irregularity. No siphon stenosis.  Negative left ophthalmic artery origin and left ICA terminus.  Just distal or adjacent to the right ophthalmic artery origin there is a saccular 3 x 5 mm right ICA aneurysm (  series 2, image 84 and series 103, image 3). This is directed cephalad. Otherwise negative supra clinoid right ICA and right ICA terminus.  Normal MCA and ACA origins. Anterior communicating artery is within normal limits. Tortuous but otherwise negative visualized bilateral ACA branches. Bilateral MCA M1 segments, bifurcations, and visualized MCA branches are within normal limits ; there is mild proximal M2 ectasia at the right carotid bifurcation.  No intracranial mass effect. No ventriculomegaly.  IMPRESSION: 1. 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 Recommend Neuro-endovascular consultation. 2. Extensive great vessel origin and carotid bifurcation atherosclerosis, but the only hemodynamically significant stenosis identified is in the proximal left subclavian artery (up to 65%). 3. No intracranial stenosis.   Electronically Signed By: Genevie Ann M.D. On: 10/12/2015 11:38          DG Chest Port 1 View (Final result) Result time: 10/12/15 05:20:59   Final result by Rad Results In Interface (10/12/15 05:20:59)   Narrative:   CLINICAL DATA: Subacute onset of headache for 2 weeks. Initial encounter.  EXAM: PORTABLE CHEST 1 VIEW  COMPARISON: Chest radiograph performed  07/27/2015  FINDINGS: The lungs are well-aerated. Mild vascular congestion is noted. Mild bibasilar opacities likely reflect atelectasis. There is no evidence of pleural effusion or pneumothorax.  The cardiomediastinal silhouette is borderline normal in size. No acute osseous abnormalities are seen.  IMPRESSION: Mild vascular congestion noted. Mild bibasilar opacities likely reflect atelectasis.   Electronically Signed By: Garald Balding M.D. On: 10/12/2015 05:20         INITIAL IMPRESSION / ASSESSMENT AND PLAN / ED COURSE  Pertinent labs & imaging results that were available during my care of the patient were reviewed by me and considered in my medical decision making (see chart for details).  Given persistent hypertension and ongoing headache despite IV narcotic administration MR angiogram of the head and neck will be performed. Patient had an MRI performed during her last emergency Department visit without vascular evaluation. We'll transfer the patient's care to Dr. Jacqualine Code pending MRI  ____________________________________________   FINAL CLINICAL IMPRESSION(S) / ED DIAGNOSES  Final diagnoses:  Oxygen decrease  Essential hypertension      Gregor Hams, MD 10/13/15 [phone]

## 2015-10-12 NOTE — ED Provider Notes (Addendum)
-----------------------------------------   12:45 PM on 10/12/2015 -----------------------------------------  The patient is resting comfortably. He is actually asleep on entry, but wakes up easily and states that she is still having moderate headache. Her blood pressures are improved though still elevated somewhat. I will give patient additional dose of clonidine here.  The MRI is concerning as it does note a small aneurysm though it does not appear to be acutely ruptured by imaging.  IMPRESSION: 1. 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 Recommend Neuro-endovascular consultation. 2. Extensive great vessel origin and carotid bifurcation atherosclerosis, but the only hemodynamically significant stenosis identified is in the proximal left subclavian artery (up to 65%). 3. No intracranial stenosis.  Given the patient's persistent hypertension headache and associated aneurysm on MRI of discussed the case with Dr. Doy Mince of neurology who advises close outpatient follow-up. She advises that this aneurysm is not clinically significant at this time, aside from needing outpatient follow-up.  Discussed with Dr. Judeen Hammans the hospitalist service, advises giving beta blocker. Patient does take metoprolol, I'll give her a dose of IV Lopressor for blood pressure management. Continue to monitor in the ER. Overall the patient is appearing improved, no evidence of acute stroke.  Discussed with patient and family. Better as improved, headache is improved her blood pressure is currently 170/91. She is awake alert in no distress. We'll increase her total Norvasc to 7.5 mg daily and she'll continue her regular blood pressure medicines. She has close follow-up with Dr. Raechel Ache already scheduled for Monday.  Patient and her husband both comfortable with plan for discharge and follow-up instructions.  Return precautions and treatment recommendations and follow-up discussed with the patient who  is agreeable with the plan.     Delman Kitten, MD 10/12/15 1546  Delman Kitten, MD 10/12/15 [phone]

## 2015-10-12 NOTE — ED Notes (Signed)
Pt in with co headache x 2 weeks, was here Wednesday and had MRI which was normal.  Does have high bp states was added a new med yest.

## 2015-10-12 NOTE — ED Notes (Signed)
MD Brown at bedside.

## 2015-10-12 NOTE — ED Notes (Signed)
Informed MD Owens Shark of patients increasing pain. MD reported he wanted to see how the hydralazine affects the pt's BP before giving the patient any more pain medications.

## 2015-10-12 NOTE — ED Notes (Signed)
Pt's BP trending up. Informed MD Owens Shark. MD Owens Shark reported he would put in orders for mg hydralazine.

## 2015-10-12 NOTE — ED Notes (Signed)
Pt reports headache X 2 weeks. Pt reports she was prescribed new BP medication (amlodipine) Wednesday, took dose Thursday.  Pt reports she has already taken her levothyroxine this morning, she normally takes it at 7am. Pt also c/o nausea/vomiting, lightheadedness, dizziness, and SOB.

## 2015-10-12 NOTE — ED Notes (Signed)
Patient transported to MRI 

## 2015-10-12 NOTE — ED Notes (Signed)
Pt states she has claustrophobia and last time she had an MRI she had to come out early due to claustrophobia.  Dr. Owens Shark informed and given new order for Ativan IV before MRI.

## 2015-10-12 NOTE — Discharge Instructions (Signed)
Please follow up closely with your doctor as you have scheduled on Monday.  Please increase her dose of amlodipine to 7.5 mg once daily (One of the 5 mg tabs,  Plus one 2.5 mg tablet.)  You have been seen in the Emergency Department (ED) for a headache.  Please use Tylenol for symptoms, but only as written on the box.  As we have discussed, please follow up with your primary care doctor as soon as possible regarding todays Emergency Department (ED) visit and your headache symptoms.    Call your doctor or return to the ED if you have a worsening headache, sudden and severe headache, confusion, slurred speech, facial droop, weakness or numbness in any arm or leg, extreme fatigue, vision problems, or other symptoms that concern you.

## 2015-10-12 NOTE — ED Notes (Signed)
Pt desat'd down to 85. Placed pt on 2L [place]. Pt o2 sat currently 97

## 2015-10-13 LAB — URINE CULTURE: Culture: >=100000

## 2015-10-14 NOTE — Progress Notes (Addendum)
ER Culture Report: Patient seen in [facility] ED 10/10/15 and again on 10/12/15 with HA, HTN.  [facility] CX from 10/10/15 Positive for >100,000 E.coli (pan sensitive). Patient not discharged on any antibiotic. If consider patient needs Antibiotic- would consider Cephalexin 500mg  po q12h x 7d.  No Known Allergies   Information tubed to ER MD  Chinita Greenland PharmD Clinical Pharmacist 10/14/2015  Spoke with pt. Requested Rx be called into [facility] in [place]. Cephalexin 500mg  BID x 7 was called into pharmacy. RX message left. Authorized by Dr. Archie Balboa.  Ramond Dial, Pharm.D Clinical Pharmacist

## 2015-10-15 DIAGNOSIS — I771 Stricture of artery: Secondary | ICD-10-CM | POA: Insufficient documentation

## 2015-10-17 ENCOUNTER — Emergency Department: Payer: Medicare HMO

## 2015-10-17 ENCOUNTER — Observation Stay
Admission: EM | Admit: 2015-10-17 | Discharge: 2015-10-18 | Disposition: A | Discharge status: Home / Self Care | Payer: Medicare HMO | Attending: Internal Medicine | Admitting: Internal Medicine

## 2015-10-17 ENCOUNTER — Observation Stay: Payer: Medicare HMO

## 2015-10-17 DIAGNOSIS — E1122 Type 2 diabetes mellitus with diabetic chronic kidney disease: Secondary | ICD-10-CM | POA: Insufficient documentation

## 2015-10-17 DIAGNOSIS — G459 Transient cerebral ischemic attack, unspecified: Secondary | ICD-10-CM | POA: Diagnosis not present

## 2015-10-17 DIAGNOSIS — I671 Cerebral aneurysm, nonruptured: Secondary | ICD-10-CM | POA: Insufficient documentation

## 2015-10-17 DIAGNOSIS — Z79899 Other long term (current) drug therapy: Secondary | ICD-10-CM | POA: Insufficient documentation

## 2015-10-17 DIAGNOSIS — Z87891 Personal history of nicotine dependence: Secondary | ICD-10-CM | POA: Diagnosis not present

## 2015-10-17 DIAGNOSIS — I129 Hypertensive chronic kidney disease with stage 1 through stage 4 chronic kidney disease, or unspecified chronic kidney disease: Secondary | ICD-10-CM | POA: Insufficient documentation

## 2015-10-17 DIAGNOSIS — I739 Peripheral vascular disease, unspecified: Secondary | ICD-10-CM | POA: Diagnosis not present

## 2015-10-17 DIAGNOSIS — R4781 Slurred speech: Secondary | ICD-10-CM | POA: Diagnosis not present

## 2015-10-17 DIAGNOSIS — Z794 Long term (current) use of insulin: Secondary | ICD-10-CM | POA: Diagnosis not present

## 2015-10-17 DIAGNOSIS — Z7951 Long term (current) use of inhaled steroids: Secondary | ICD-10-CM | POA: Insufficient documentation

## 2015-10-17 DIAGNOSIS — I340 Nonrheumatic mitral (valve) insufficiency: Secondary | ICD-10-CM | POA: Insufficient documentation

## 2015-10-17 DIAGNOSIS — R4182 Altered mental status, unspecified: Secondary | ICD-10-CM | POA: Diagnosis not present

## 2015-10-17 DIAGNOSIS — Z7982 Long term (current) use of aspirin: Secondary | ICD-10-CM | POA: Diagnosis not present

## 2015-10-17 DIAGNOSIS — N183 Chronic kidney disease, stage 3 (moderate): Secondary | ICD-10-CM | POA: Insufficient documentation

## 2015-10-17 DIAGNOSIS — R410 Disorientation, unspecified: Secondary | ICD-10-CM | POA: Diagnosis present

## 2015-10-17 DIAGNOSIS — I951 Orthostatic hypotension: Secondary | ICD-10-CM | POA: Insufficient documentation

## 2015-10-17 DIAGNOSIS — Z823 Family history of stroke: Secondary | ICD-10-CM | POA: Insufficient documentation

## 2015-10-17 DIAGNOSIS — R51 Headache: Secondary | ICD-10-CM | POA: Insufficient documentation

## 2015-10-17 DIAGNOSIS — E079 Disorder of thyroid, unspecified: Secondary | ICD-10-CM | POA: Insufficient documentation

## 2015-10-17 DIAGNOSIS — I639 Cerebral infarction, unspecified: Secondary | ICD-10-CM | POA: Diagnosis present

## 2015-10-17 DIAGNOSIS — R4701 Aphasia: Secondary | ICD-10-CM | POA: Insufficient documentation

## 2015-10-17 DIAGNOSIS — I2510 Atherosclerotic heart disease of native coronary artery without angina pectoris: Secondary | ICD-10-CM | POA: Diagnosis not present

## 2015-10-17 DIAGNOSIS — J449 Chronic obstructive pulmonary disease, unspecified: Secondary | ICD-10-CM | POA: Diagnosis not present

## 2015-10-17 DIAGNOSIS — N390 Urinary tract infection, site not specified: Secondary | ICD-10-CM | POA: Insufficient documentation

## 2015-10-17 LAB — CBC WITH DIFFERENTIAL/PLATELET
BASOS ABS: 0.1 10*3/uL (ref 0–0.1)
Basophils Relative: 1 %
Eosinophils Absolute: 0.4 10*3/uL (ref 0–0.7)
Eosinophils Relative: 3 %
HEMATOCRIT: 42.1 % (ref 35.0–47.0)
Hemoglobin: 13.8 g/dL (ref 12.0–16.0)
LYMPHS PCT: 18 %
Lymphs Abs: 1.9 10*3/uL (ref 1.0–3.6)
MCH: 28.5 pg (ref 26.0–34.0)
MCHC: 32.7 g/dL (ref 32.0–36.0)
MCV: 87.2 fL (ref 80.0–100.0)
MONO ABS: 1.2 10*3/uL — AB (ref 0.2–0.9)
Monocytes Relative: 12 %
NEUTROS ABS: 7.2 10*3/uL — AB (ref 1.4–6.5)
Neutrophils Relative %: 66 %
Platelets: 234 10*3/uL (ref 150–440)
RBC: 4.84 MIL/uL (ref 3.80–5.20)
RDW: 14.1 % (ref 11.5–14.5)
WBC: 10.9 10*3/uL (ref 3.6–11.0)

## 2015-10-17 LAB — COMPREHENSIVE METABOLIC PANEL
ALK PHOS: 64 U/L (ref 38–126)
ALT: 17 U/L (ref 14–54)
AST: 28 U/L (ref 15–41)
Albumin: 3.7 g/dL (ref 3.5–5.0)
Anion gap: 13 (ref 5–15)
BILIRUBIN TOTAL: 0.7 mg/dL (ref 0.3–1.2)
BUN: 44 mg/dL — AB (ref 6–20)
CALCIUM: 8.8 mg/dL — AB (ref 8.9–10.3)
CO2: 29 mmol/L (ref 22–32)
CREATININE: 1.64 mg/dL — AB (ref 0.44–1.00)
Chloride: 93 mmol/L — ABNORMAL LOW (ref 101–111)
GFR calc Af Amer: 35 mL/min — ABNORMAL LOW (ref >60)
GFR, EST NON AFRICAN AMERICAN: 30 mL/min — AB (ref >60)
Glucose, Bld: 131 mg/dL — ABNORMAL HIGH (ref 65–99)
Potassium: 3.3 mmol/L — ABNORMAL LOW (ref 3.5–5.1)
Sodium: 135 mmol/L (ref 135–145)
TOTAL PROTEIN: 7.3 g/dL (ref 6.5–8.1)

## 2015-10-17 LAB — GLUCOSE, CAPILLARY
GLUCOSE-CAPILLARY: 132 mg/dL — AB (ref 65–99)
Glucose-Capillary: 106 mg/dL — ABNORMAL HIGH (ref 65–99)
Glucose-Capillary: 114 mg/dL — ABNORMAL HIGH (ref 65–99)

## 2015-10-17 LAB — URINALYSIS COMPLETE WITH MICROSCOPIC (ARMC ONLY)
BILIRUBIN URINE: NEGATIVE
Bacteria, UA: NONE SEEN
Glucose, UA: NEGATIVE mg/dL
HGB URINE DIPSTICK: NEGATIVE
Ketones, ur: NEGATIVE mg/dL
Leukocytes, UA: NEGATIVE
Nitrite: NEGATIVE
PH: 5 (ref 5.0–8.0)
Protein, ur: NEGATIVE mg/dL
SPECIFIC GRAVITY, URINE: 1.008 (ref 1.005–1.030)
SQUAMOUS EPITHELIAL / LPF: NONE SEEN

## 2015-10-17 LAB — PROTIME-INR
INR: 1.04
Prothrombin Time: 13.8 seconds (ref 11.4–15.0)

## 2015-10-17 LAB — TROPONIN I
TROPONIN I: 0.04 ng/mL — AB (ref <0.031)
TROPONIN I: 0.05 ng/mL — AB (ref <0.031)

## 2015-10-17 LAB — APTT: aPTT: 29 seconds (ref 24–36)

## 2015-10-17 MED ORDER — POTASSIUM CHLORIDE CRYS ER 20 MEQ PO TBCR: 40.0000 meq | EXTENDED_RELEASE_TABLET | Freq: Once | ORAL | Status: DC

## 2015-10-17 MED ORDER — SODIUM CHLORIDE 0.9% FLUSH
3.0000 mL | Freq: Two times a day (BID) | INTRAVENOUS | Status: DC
Start: 2015-10-17 — End: 2015-10-18
  Administered 2015-10-17 – 2015-10-18 (×2): 3 mL via INTRAVENOUS

## 2015-10-17 MED ORDER — POLYETHYLENE GLYCOL 3350 17 G PO PACK: 17.0000 g | PACK | Freq: Every day | ORAL | Status: DC | PRN

## 2015-10-17 MED ORDER — LORAZEPAM 1 MG PO TABS: 1.0000 mg | ORAL_TABLET | Freq: Once | ORAL | Status: DC | PRN

## 2015-10-17 MED ORDER — POTASSIUM CHLORIDE CRYS ER 20 MEQ PO TBCR
40.0000 meq | EXTENDED_RELEASE_TABLET | Freq: Once | ORAL | Status: AC
  Administered 2015-10-17: 17:00:00 40 meq via ORAL
  Filled 2015-10-17: qty 2

## 2015-10-17 MED ORDER — BUDESONIDE-FORMOTEROL FUMARATE 80-4.5 MCG/ACT IN AERO
2.0000 | INHALATION_SPRAY | Freq: Two times a day (BID) | RESPIRATORY_TRACT | Status: DC
  Administered 2015-10-17 – 2015-10-18 (×2): 2 via RESPIRATORY_TRACT
  Filled 2015-10-17: qty 6.9

## 2015-10-17 MED ORDER — TRAMADOL HCL 50 MG PO TABS
50.0000 mg | ORAL_TABLET | Freq: Four times a day (QID) | ORAL | Status: DC | PRN
  Administered 2015-10-17: 21:00:00 50 mg via ORAL
  Filled 2015-10-17: qty 1

## 2015-10-17 MED ORDER — INSULIN ASPART 100 UNIT/ML ~~LOC~~ SOLN: 0.0000 [IU] | Freq: Three times a day (TID) | SUBCUTANEOUS | Status: DC

## 2015-10-17 MED ORDER — FLUTICASONE FUROATE-VILANTEROL 100-25 MCG/INH IN AEPB: 1.0000 | INHALATION_SPRAY | Freq: Every day | RESPIRATORY_TRACT | Status: DC

## 2015-10-17 MED ORDER — LEVOTHYROXINE SODIUM 88 MCG PO TABS
88.0000 ug | ORAL_TABLET | Freq: Every day | ORAL | Status: DC
  Administered 2015-10-18: 88 ug via ORAL
  Filled 2015-10-17 (×3): qty 1

## 2015-10-17 MED ORDER — SODIUM CHLORIDE 0.9 % IV SOLN: 250.0000 mL | INTRAVENOUS | Status: DC | PRN

## 2015-10-17 MED ORDER — FELODIPINE ER 10 MG PO TB24
10.0000 mg | ORAL_TABLET | Freq: Every day | ORAL | Status: DC
  Administered 2015-10-18: 12:00:00 10 mg via ORAL
  Filled 2015-10-17: qty 1

## 2015-10-17 MED ORDER — HYDROCHLOROTHIAZIDE 25 MG PO TABS
25.0000 mg | ORAL_TABLET | Freq: Every day | ORAL | Status: DC
  Administered 2015-10-17 – 2015-10-18 (×2): 25 mg via ORAL
  Filled 2015-10-17 (×2): qty 1

## 2015-10-17 MED ORDER — SALINE SPRAY 0.65 % NA SOLN
1.0000 | NASAL | Status: DC | PRN
  Filled 2015-10-17: qty 44

## 2015-10-17 MED ORDER — TIZANIDINE HCL 4 MG PO TABS: 4.0000 mg | ORAL_TABLET | Freq: Four times a day (QID) | ORAL | Status: DC | PRN

## 2015-10-17 MED ORDER — ACETAMINOPHEN 325 MG PO TABS
650.0000 mg | ORAL_TABLET | Freq: Four times a day (QID) | ORAL | Status: DC | PRN
  Administered 2015-10-17 – 2015-10-18 (×2): 650 mg via ORAL
  Filled 2015-10-17 (×2): qty 2

## 2015-10-17 MED ORDER — SODIUM CHLORIDE 0.9% FLUSH
3.0000 mL | Freq: Two times a day (BID) | INTRAVENOUS | Status: DC
  Administered 2015-10-17: 23:00:00 3 mL via INTRAVENOUS

## 2015-10-17 MED ORDER — ALBUTEROL SULFATE (2.5 MG/3ML) 0.083% IN NEBU: 2.5000 mg | INHALATION_SOLUTION | Freq: Four times a day (QID) | RESPIRATORY_TRACT | Status: DC | PRN

## 2015-10-17 MED ORDER — ACETAMINOPHEN 650 MG RE SUPP
650.0000 mg | Freq: Four times a day (QID) | RECTAL | Status: DC | PRN
Start: 2015-10-17 — End: 2015-10-18

## 2015-10-17 MED ORDER — PANTOPRAZOLE SODIUM 40 MG PO TBEC
40.0000 mg | DELAYED_RELEASE_TABLET | Freq: Every day | ORAL | Status: DC
  Administered 2015-10-17 – 2015-10-18 (×2): 40 mg via ORAL
  Filled 2015-10-17 (×2): qty 1

## 2015-10-17 MED ORDER — POTASSIUM CHLORIDE IN NACL 20-0.9 MEQ/L-% IV SOLN
INTRAVENOUS | Status: DC
  Administered 2015-10-17 – 2015-10-18 (×2): via INTRAVENOUS
  Filled 2015-10-17 (×4): qty 1000

## 2015-10-17 MED ORDER — DOXEPIN HCL 25 MG PO CAPS
25.0000 mg | ORAL_CAPSULE | Freq: Every day | ORAL | Status: DC
  Administered 2015-10-17: 25 mg via ORAL
  Filled 2015-10-17 (×2): qty 1

## 2015-10-17 MED ORDER — CEPHALEXIN 500 MG PO CAPS
500.0000 mg | ORAL_CAPSULE | Freq: Three times a day (TID) | ORAL | Status: DC
  Administered 2015-10-17 – 2015-10-18 (×3): 500 mg via ORAL
  Filled 2015-10-17 (×7): qty 1

## 2015-10-17 MED ORDER — LORAZEPAM 1 MG PO TABS
1.0000 mg | ORAL_TABLET | Freq: Once | ORAL | Status: AC | PRN
  Administered 2015-10-18: 08:00:00 1 mg via ORAL
  Filled 2015-10-17: qty 1

## 2015-10-17 MED ORDER — LISINOPRIL 10 MG PO TABS
10.0000 mg | ORAL_TABLET | Freq: Every day | ORAL | Status: DC
  Administered 2015-10-17 – 2015-10-18 (×2): 10 mg via ORAL
  Filled 2015-10-17 (×3): qty 1

## 2015-10-17 MED ORDER — ATORVASTATIN CALCIUM 20 MG PO TABS
40.0000 mg | ORAL_TABLET | Freq: Every day | ORAL | Status: DC
  Administered 2015-10-17: 19:00:00 40 mg via ORAL
  Filled 2015-10-17: qty 2

## 2015-10-17 MED ORDER — METOPROLOL SUCCINATE ER 50 MG PO TB24
50.0000 mg | ORAL_TABLET | Freq: Every day | ORAL | Status: DC
  Administered 2015-10-17 – 2015-10-18 (×2): 50 mg via ORAL
  Filled 2015-10-17 (×2): qty 1

## 2015-10-17 MED ORDER — ASPIRIN 81 MG PO CHEW
324.0000 mg | CHEWABLE_TABLET | Freq: Once | ORAL | Status: AC
  Administered 2015-10-17: 324 mg via ORAL
  Filled 2015-10-17: qty 4

## 2015-10-17 MED ORDER — SODIUM CHLORIDE 0.9% FLUSH: 3.0000 mL | INTRAVENOUS | Status: DC | PRN

## 2015-10-17 MED ORDER — ASPIRIN EC 81 MG PO TBEC
81.0000 mg | DELAYED_RELEASE_TABLET | Freq: Every day | ORAL | Status: DC
  Administered 2015-10-18: 81 mg via ORAL
  Filled 2015-10-17: qty 1

## 2015-10-17 MED ORDER — POTASSIUM CHLORIDE IN NACL 20-0.9 MEQ/L-% IV SOLN
INTRAVENOUS | Status: DC
  Filled 2015-10-17 (×2): qty 1000

## 2015-10-17 MED ORDER — ENOXAPARIN SODIUM 40 MG/0.4ML ~~LOC~~ SOLN
40.0000 mg | SUBCUTANEOUS | Status: DC
  Administered 2015-10-17: 21:00:00 40 mg via SUBCUTANEOUS
  Filled 2015-10-17: qty 0.4

## 2015-10-17 MED ORDER — FLUTICASONE PROPIONATE 50 MCG/ACT NA SUSP
1.0000 | Freq: Every day | NASAL | Status: DC | PRN
  Filled 2015-10-17: qty 16

## 2015-10-17 NOTE — Progress Notes (Signed)
   10/17/15 1900  Clinical Encounter Type  Visited With Patient and family together  Visit Type Initial  Referral From Nurse  Consult/Referral To Chaplain  Chaplain provided HPOA education to patient and family. They will make a decision and call chaplain if they are wanting to complete.   Wade Hampton [phone]

## 2015-10-17 NOTE — ED Notes (Addendum)
Pt bib EMS w/ c/o TIA from home.  Per EMS pt seen at [place] 3 days ago w/ c/o aneurism and has yet to see specialist.  Pt reports S/S starting at 11:30 today.  Pt sts she was talking to daughter on phone when she became aphasic.  Per EMS, pts daughter stated that pt did not know children's name or own name. Per EMS, S/S began to resolve while on phone w/ daughter, whole episode lasted approx 30 min.  Per EMS pt A/Ox4 on scene, but ortho static SBP 135 to 77 w/ standing).  Pt denies pain, speaking clearly, A/Ox4, has sensation in all limbs and able to stand to get in stretcher.  CBG 126  Received 500 cc NS via EMS.

## 2015-10-17 NOTE — ED Notes (Signed)
MD Gayle at bedside. 

## 2015-10-17 NOTE — ED Notes (Signed)
Patient transported to Ultrasound 

## 2015-10-17 NOTE — ED Provider Notes (Signed)
[facility] Emergency Department Provider Note  ____________________________________________  Time seen: Approximately 12:29 PM  I have reviewed the triage vital signs and the nursing notes.   HISTORY  Chief Complaint Transient Ischemic Attack    HPI Tamara Finley is a 75 y.o. female with history of hypertension, diabetes, COPD, thyroid disorder, chronic kidney disease who presents for evaluation of 30-45 minutes of severe confusion, gradual onset, initially severe now resolved, no modifying factors. The patient reports that this morning she woke feeling well but just prior to arrival she had called her daughter and was very confused, able to reversible things even the names of her children. On EMS arrival, she did have orthostatic hypotension with systolic blood pressure in the 70s with standing. This improved with IV fluids. Of note, the patient was seen here on 10/10/2015 as well as 10/12/2015 for headaches, underwent CT and MRI of the brain. 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 She denies any headache today. No chest pain or difficulty breathing. She had urine cultures from 10/10/2015 that grew pansensitive Escherichia coli, started on Keflex. She denies dysuria currently.   Past Medical History  Diagnosis Date  . Hypertension   . Diabetes mellitus without complication ([place])   . COPD (chronic obstructive pulmonary disease) ([place])   . Renal disorder   . Thyroid disease     There are no active problems to display for this patient.   Past Surgical History  Procedure Laterality Date  . Cardiac surgery    . Abdominal aortic aneurysm repair      Current Outpatient Rx  Name  Route  Sig  Dispense  Refill  . albuterol (PROVENTIL HFA;VENTOLIN HFA) 108 (90 BASE) MCG/ACT inhaler   Inhalation   Inhale 2 puffs into the lungs every 6 (six) hours as needed for wheezing or  shortness of breath.         Marland Kitchen aspirin 81 MG chewable tablet   Oral   Chew 81 mg by mouth daily.         . Calcium Carbonate-Vitamin D (CALCIUM 600+D) 600-400 MG-UNIT tablet   Oral   Take 1 tablet by mouth 2 (two) times daily with a meal.         . cephALEXin (KEFLEX) 500 MG capsule   Oral   Take 500 mg by mouth 3 (three) times daily. For 10 days         . cholecalciferol (VITAMIN D) 1000 units tablet   Oral   Take 2,000 Units by mouth daily.         Marland Kitchen doxepin (SINEQUAN) 25 MG capsule   Oral   Take 25 mg by mouth at bedtime.         . felodipine (PLENDIL) 10 MG 24 hr tablet   Oral   Take 10 mg by mouth daily.         . fluticasone (FLONASE) 50 MCG/ACT nasal spray   Each Nare   Place 1-2 sprays into both nostrils daily as needed for rhinitis.         . Fluticasone Furoate-Vilanterol (BREO ELLIPTA) 100-25 MCG/INH AEPB   Inhalation   Inhale 1 puff into the lungs daily.         . hydrochlorothiazide (HYDRODIURIL) 25 MG tablet   Oral   Take 25 mg by mouth daily.         Marland Kitchen levothyroxine (  SYNTHROID, LEVOTHROID) 88 MCG tablet   Oral   Take 88 mcg by mouth daily before breakfast.         . metoprolol succinate (TOPROL-XL) 50 MG 24 hr tablet   Oral   Take 50 mg by mouth daily.          . pantoprazole (PROTONIX) 40 MG tablet   Oral   Take 40 mg by mouth daily.         . simvastatin (ZOCOR) 80 MG tablet   Oral   Take 80 mg by mouth at bedtime.          . sodium chloride (OCEAN) 0.65 % SOLN nasal spray   Each Nare   Place 1-2 sprays into both nostrils as needed for congestion.         Marland Kitchen tiZANidine (ZANAFLEX) 4 MG tablet   Oral   Take 4 mg by mouth every 6 (six) hours as needed for muscle spasms.         . traMADol (ULTRAM) 50 MG tablet   Oral   Take 50 mg by mouth every 6 (six) hours as needed for moderate pain.          Marland Kitchen amLODipine (NORVASC) 2.5 MG tablet   Oral   Take 1 tablet (2.5 mg total) by mouth daily. Patient not  taking: Reported on 10/17/2015   14 tablet   0   . amLODipine (NORVASC) 5 MG tablet   Oral   Take 1 tablet (5 mg total) by mouth daily. Patient not taking: Reported on 10/17/2015   15 tablet   0     Allergies Review of patient's allergies indicates no known allergies.  No family history on file.  Social History Social History  Substance Use Topics  . Smoking status: Former Research scientist (life sciences)  . Smokeless tobacco: None  . Alcohol Use: No    Review of Systems Constitutional: No fever/chills Eyes: No visual changes. ENT: No sore throat. Cardiovascular: Denies chest pain. Respiratory: Denies shortness of breath. Gastrointestinal: No abdominal pain.  No nausea, no vomiting.  No diarrhea.  No constipation. Genitourinary: Negative for dysuria. Musculoskeletal: Negative for back pain. Skin: Negative for rash. Neurological: Negative for headaches, focal weakness or numbness.  10-point ROS otherwise negative.  ____________________________________________   PHYSICAL EXAM:  Filed Vitals:   10/17/15 1242 10/17/15 1300 10/17/15 1330  BP: 167/70 150/66 156/75  Pulse: 92 89 90  Temp: 97.6 F (36.4 C)    TempSrc: Oral    Resp: 12 21 16   Weight: 184 lb (83.462 kg)    SpO2: 94% 91% 95%      Constitutional: Alert and oriented. Well appearing and in no acute distress. +intention tremor. Eyes: Conjunctivae are normal. PERRL. EOMI. Head: Atraumatic. Nose: No congestion/rhinnorhea. Mouth/Throat: Mucous membranes are moist.  Oropharynx non-erythematous. Neck: No stridor.  No cervical spine tenderness to palpation. Supple without meningismus. Cardiovascular: Normal rate, regular rhythm. Grossly normal heart sounds.  Good peripheral circulation. Respiratory: Normal respiratory effort.  No retractions. Lungs CTAB. Gastrointestinal: Soft and nontender. No distention. No CVA tenderness. Genitourinary: deferred Musculoskeletal: No lower extremity tenderness nor edema.  No joint  effusions. Neurologic:  Normal speech and language. No gross focal neurologic deficits are appreciated. No gait instability. 5 out of 5 strength in bilateral upper and lower extremities, sensation intact to light touch throughout, cranial nerves II through XII intact, normal finger-nose-finger. Skin:  Skin is warm, dry and intact. No rash noted. Psychiatric: Mood and affect are normal. Speech and  behavior are normal.  ____________________________________________   LABS (all labs ordered are listed, but only abnormal results are displayed)  Labs Reviewed  CBC WITH DIFFERENTIAL/PLATELET - Abnormal; Notable for the following:    Neutro Abs 7.2 (*)    Monocytes Absolute 1.2 (*)    All other components within normal limits  COMPREHENSIVE METABOLIC PANEL - Abnormal; Notable for the following:    Potassium 3.3 (*)    Chloride 93 (*)    Glucose, Bld 131 (*)    BUN 44 (*)    Creatinine, Ser 1.64 (*)    Calcium 8.8 (*)    GFR calc non Af Amer 30 (*)    GFR calc Af Amer 35 (*)    All other components within normal limits  TROPONIN I - Abnormal; Notable for the following:    Troponin I 0.04 (*)    All other components within normal limits  GLUCOSE, CAPILLARY - Abnormal; Notable for the following:    Glucose-Capillary 132 (*)    All other components within normal limits  PROTIME-INR  APTT  URINALYSIS COMPLETEWITH MICROSCOPIC ([facility] ONLY)  CBG MONITORING, ED   ____________________________________________  EKG  ED ECG REPORT I, Joanne Gavel, the attending physician, personally viewed and interpreted this ECG.   Date: 10/17/2015  EKG Time: 12:34  Rate: 94  Rhythm: normal sinus rhythm  Axis: normal  Intervals:left anterior fascicular block  ST&T Change: No acute ST elevation.  ____________________________________________  RADIOLOGY  CXR IMPRESSION: No acute abnormality. Aortic and coronary artery atherosclerosis.   CT head IMPRESSION: No acute intracranial abnormality.  Mild cerebral atrophy and chronic small vessel disease.  ____________________________________________   PROCEDURES  Procedure(s) performed: None  Critical Care performed: No  ____________________________________________   INITIAL IMPRESSION / ASSESSMENT AND PLAN / ED COURSE  Pertinent labs & imaging results that were available during my care of the patient were reviewed by me and considered in my medical decision making (see chart for details).  Tamara Finley is a 75 y.o. female with history of hypertension, diabetes, COPD, thyroid disorder, chronic kidney disease who presents for evaluation of 30-45 minutes of severe confusion, now resolved. On exam, she is very well-appearing and in no acute distress. Vital signs stable, she is afebrile she has an intact neurological examination in no acute medical complaints at this time however symptoms today are concerning for possible TIA. NIH stroke scale is 0 at this time. That being said, she also had reported orthostatic hypotension but is not hypertensive, IV fluids given. We'll obtain screening labs, CT head, chest x-ray, urinalysis. I discussed the case with Dr. Irish Elders of neurology and he recommends admission.  ----------------------------------------- 2:05 PM on 10/17/2015 ----------------------------------------- CT head, chest x-ray showed no acute abnormalities. Labs reviewed. CMP is notable for moderate creatinine elevation of 1.64, IV fluids ordered, normal CBC, troponin elevated at 0.04. The patient denies any chest pain or shortness of breath. Aspirin ordered. Case discussed with hospitalist, Dr. Tressia Miners, for admission at this time. UA pending at time of admission.  ____________________________________________   FINAL CLINICAL IMPRESSION(S) / ED DIAGNOSES  Final diagnoses:  Confusion  Transient cerebral ischemia, unspecified transient cerebral ischemia type      Joanne Gavel, MD 10/17/15 1407

## 2015-10-17 NOTE — Care Management Obs Status (Signed)
[place] NOTIFICATION   Patient Details  Name: Tamara Finley MRN: [id number] Date of Birth: 08/13/1941   Medicare Observation Status Notification Given:  Yes    Beau Fanny, RN 10/17/2015, 2:51 PM

## 2015-10-17 NOTE — H&P (Signed)
Tamara Finley at [place] NAME: Tamara Finley    MR#:  [id number]  DATE OF BIRTH:  Sep 14, 1940  DATE OF ADMISSION:  10/17/2015  PRIMARY CARE PHYSICIAN: Ezequiel Kayser, MD   REQUESTING/REFERRING PHYSICIAN: Dr. Edd Fabian- ED  CHIEF COMPLAINT:   Chief Complaint  Patient presents with  . Transient Ischemic Attack    HISTORY OF PRESENT ILLNESS:  Tamara Finley  is a 75 y.o. female with a known history of hypertension, prediabetes, COPD presents to the emergency room with acute onset of expressive aphasia lasting about 30 minutes. Patient suddenly developed expressive aphasia unable to talk over the phone with daughter. This persisted and daughter arrived at the patient's home. On arrival by EMS patient was orthostatic with systolic blood pressure dropping from 135-77. She was given 500 ML normal saline bolus. Blood glucose was normal. Patient's symptoms resolved by the time she arrived at the emergency room. Symptoms resolved by the time patient arrived to the emergency room. Presently complains of some heaviness in the head and chest. 1/10 score.  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 After discussing with neurology patient was discharged home to follow-up as outpatient. No stroke on the MRI. Due to uncontrolled blood pressure and headache patient went to [facility] 2 days later and was referred to her PCP. Her amlodipine was changed to felodipine by her primary care physician 2 days back along with adding Keflex for UTI. Presently has no dysuria or hematuria.  PAST MEDICAL HISTORY:   Past Medical History  Diagnosis Date  . Hypertension   . Diabetes mellitus without complication ([place])   . COPD (chronic obstructive pulmonary disease) ([place])   . Renal disorder   . Thyroid disease      PAST SURGICAL HISTORY:   Past Surgical History  Procedure Laterality Date  . Cardiac surgery    . Abdominal aortic aneurysm repair      SOCIAL HISTORY:   Social History  Substance Use Topics  . Smoking status: Former Research scientist (life sciences)  . Smokeless tobacco: Not on file  . Alcohol Use: No    FAMILY HISTORY:   Family History  Problem Relation Age of Onset  . CVA Mother     DRUG ALLERGIES:  No Known Allergies  REVIEW OF SYSTEMS:   Review of Systems  Constitutional: Positive for malaise/fatigue. Negative for fever, chills and weight loss.  HENT: Negative for hearing loss and nosebleeds.   Eyes: Negative for blurred vision, double vision and pain.  Respiratory: Negative for cough, hemoptysis, sputum production, shortness of breath and wheezing.   Cardiovascular: Positive for chest pain. Negative for palpitations, orthopnea and leg swelling.  Gastrointestinal: Negative for nausea, vomiting, abdominal pain, diarrhea and constipation.  Genitourinary: Negative for dysuria and hematuria.  Musculoskeletal: Negative for myalgias, back pain and falls.  Skin: Negative for rash.  Neurological: Positive for speech change, weakness and headaches. Negative for dizziness, tremors, sensory change, focal weakness and seizures.  Endo/Heme/Allergies: Does not bruise/bleed easily.  Psychiatric/Behavioral: Negative for depression and memory loss. The patient is not nervous/anxious.     MEDICATIONS AT HOME:   Prior to Admission medications   Medication Sig Start Date End Date Taking? Authorizing Provider  albuterol (PROVENTIL HFA;VENTOLIN HFA) 108 (90 BASE) MCG/ACT inhaler Inhale 2 puffs into the lungs every 6 (six) hours as needed  for wheezing or shortness of breath.   Yes Historical Provider, MD  aspirin 81 MG chewable tablet Chew 81 mg by mouth daily.   Yes Historical Provider, MD  Calcium Carbonate-Vitamin D (CALCIUM 600+D) 600-400 MG-UNIT tablet Take 1 tablet by mouth 2 (two) times daily with  a meal.   Yes Historical Provider, MD  cephALEXin (KEFLEX) 500 MG capsule Take 500 mg by mouth 3 (three) times daily. For 10 days 10/15/15  Yes Historical Provider, MD  cholecalciferol (VITAMIN D) 1000 units tablet Take 2,000 Units by mouth daily.   Yes Historical Provider, MD  doxepin (SINEQUAN) 25 MG capsule Take 25 mg by mouth at bedtime.   Yes Historical Provider, MD  felodipine (PLENDIL) 10 MG 24 hr tablet Take 10 mg by mouth daily.   Yes Historical Provider, MD  fluticasone (FLONASE) 50 MCG/ACT nasal spray Place 1-2 sprays into both nostrils daily as needed for rhinitis.   Yes Historical Provider, MD  Fluticasone Furoate-Vilanterol (BREO ELLIPTA) 100-25 MCG/INH AEPB Inhale 1 puff into the lungs daily.   Yes Historical Provider, MD  hydrochlorothiazide (HYDRODIURIL) 25 MG tablet Take 25 mg by mouth daily.   Yes Historical Provider, MD  levothyroxine (SYNTHROID, LEVOTHROID) 88 MCG tablet Take 88 mcg by mouth daily before breakfast.   Yes Historical Provider, MD  metoprolol succinate (TOPROL-XL) 50 MG 24 hr tablet Take 50 mg by mouth daily.    Yes Historical Provider, MD  pantoprazole (PROTONIX) 40 MG tablet Take 40 mg by mouth daily.   Yes Historical Provider, MD  simvastatin (ZOCOR) 80 MG tablet Take 80 mg by mouth at bedtime.    Yes Historical Provider, MD  sodium chloride (OCEAN) 0.65 % SOLN nasal spray Place 1-2 sprays into both nostrils as needed for congestion.   Yes Historical Provider, MD  tiZANidine (ZANAFLEX) 4 MG tablet Take 4 mg by mouth every 6 (six) hours as needed for muscle spasms.   Yes Historical Provider, MD  traMADol (ULTRAM) 50 MG tablet Take 50 mg by mouth every 6 (six) hours as needed for moderate pain.    Yes Historical Provider, MD  amLODipine (NORVASC) 2.5 MG tablet Take 1 tablet (2.5 mg total) by mouth daily. Patient not taking: Reported on 10/17/2015 10/12/15 10/11/16  Delman Kitten, MD  amLODipine (NORVASC) 5 MG tablet Take 1 tablet (5 mg total) by mouth daily. Patient not  taking: Reported on 10/17/2015 10/10/15 10/09/16  Orbie Pyo, MD      VITAL SIGNS:  Blood pressure 168/72, pulse 84, temperature 97.6 F (36.4 C), temperature source Oral, resp. rate 16, weight 83.462 kg (184 lb), SpO2 94 %.  PHYSICAL EXAMINATION:  Physical Exam  GENERAL:  75 y.o.-year-old patient lying in the bed with no acute distress.  EYES: Pupils equal, round, reactive to light and accommodation. No scleral icterus. Extraocular muscles intact.  HEENT: Head atraumatic, normocephalic. Oropharynx and nasopharynx clear. No oropharyngeal erythema, moist oral mucosa  NECK:  Supple, no jugular venous distention. No thyroid enlargement, no tenderness.  LUNGS: Normal breath sounds bilaterally, no wheezing, rales, rhonchi. No use of accessory muscles of respiration.  CARDIOVASCULAR: S1, S2 normal. No murmurs, rubs, or gallops.  ABDOMEN: Soft, nontender, nondistended. Bowel sounds present. No organomegaly or mass.  EXTREMITIES: No pedal edema, cyanosis, or clubbing. + 2 pedal & radial pulses b/l.   NEUROLOGIC: Cranial nerves II through XII are intact. No focal Motor or sensory deficits appreciated b/l PSYCHIATRIC: The patient is alert and oriented x 3. Good affect.  SKIN: No  obvious rash, lesion, or ulcer.   LABORATORY PANEL:   CBC  Recent Labs Lab 10/17/15 1253  WBC 10.9  HGB 13.8  HCT 42.1  PLT 234   ------------------------------------------------------------------------------------------------------------------  Chemistries   Recent Labs Lab 10/17/15 1253  NA 135  K 3.3*  CL 93*  CO2 29  GLUCOSE 131*  BUN 44*  CREATININE 1.64*  CALCIUM 8.8*  AST 28  ALT 17  ALKPHOS 64  BILITOT 0.7   ------------------------------------------------------------------------------------------------------------------  Cardiac Enzymes  Recent Labs Lab 10/17/15 1253  TROPONINI 0.04*    ------------------------------------------------------------------------------------------------------------------  RADIOLOGY:  Dg Chest 2 View  10/17/2015  CLINICAL DATA:  Altered mental status.  Transient ischemic attack. EXAM: CHEST  2 VIEW COMPARISON:  10/12/2015 and 07/27/2015 FINDINGS: Heart size and pulmonary vascularity are normal and the lungs are clear. Coronary artery calcifications and extensive calcification in the thoracic aorta. No infiltrates or effusions. No acute osseous abnormality. IMPRESSION: No acute abnormality.  Aortic and coronary artery atherosclerosis. Electronically Signed   By: Lorriane Shire M.D.   On: 10/17/2015 13:08   Ct Head Wo Contrast  10/17/2015  CLINICAL DATA:  Acute onset slurred speech and aphasia this morning. Confusion. EXAM: CT HEAD WITHOUT CONTRAST TECHNIQUE: Contiguous axial images were obtained from the base of the skull through the vertex without intravenous contrast. COMPARISON:  10/10/2015 FINDINGS: There is no evidence of intracranial hemorrhage, brain edema, or other signs of acute infarction. There is no evidence of intracranial mass lesion or mass effect. No abnormal extraaxial fluid collections are identified. Mild cerebral atrophy and chronic small vessel disease are stable in appearance. No evidence of hydrocephalus. No skull abnormality identified. IMPRESSION: No acute intracranial abnormality. Mild cerebral atrophy and chronic small vessel disease. Electronically Signed   By: Earle Gell M.D.   On: 10/17/2015 12:52     IMPRESSION AND PLAN:   * TIA with Expressive aphasia -Check MRI of the brain, Echo, Carotid dopplers - Start aspirin and statin. - Lovenox for DVT prophylaxis.  * Uncontrolled hypertension Patient blood pressure was elevated into the 190s during her last visit to the emergency room 5 days back. She also visited [facility] emergency room where her blood pressure was elevated. Amlodipine was changed to felodipine by her PCP 2  days back. Add lisinopril 10 mg as patient has CKD stage III and also has impaired glucose tolerance. Continue patient's metoprolol and felodipine  * Mild elevation in troponin with some chest heaviness Likely from accident or hypertension. Check 2 more sets of cardiac enzymes. Check echocardiogram. May need a stress test as outpatient.  * Prediabetes Check HbA1c and sliding scale insulin  * COPD is stable Continue home inhalers  * UTI Continue Keflex which patient has already taken for 2 days   All the records are reviewed and case discussed with ED provider. Management plans discussed with the patient, family and they are in agreement.  CODE STATUS: FULL  TOTAL TIME TAKING CARE OF THIS PATIENT: 40 minutes.    Hillary Bow R M.D on 10/17/2015 at 2:44 PM  Between 7am to 6pm - Pager - [phone]  After 6pm go to [email or web address] - password [email or web address] [facility]  Office  [phone]  CC: Primary care physician; Ezequiel Kayser, MD     Note: This dictation was prepared with Dragon dictation along with smaller phrase technology. Any transcriptional errors that result from this process are unintentional.

## 2015-10-17 NOTE — ED Notes (Signed)
MD Edd Fabian made aware of abnormal lab

## 2015-10-17 NOTE — Plan of Care (Signed)
Problem: Tissue Perfusion: Goal: Cerebral tissue perfusion will improve Outcome: Progressing Area of  eccymosis on bil arm more so on rt lower arm and rt upper arm. Pt reports bruising easily. Pt on abx  For  Area on rt arm.

## 2015-10-17 NOTE — ED Notes (Signed)
Patient transported to CT 

## 2015-10-18 ENCOUNTER — Observation Stay (HOSPITAL_BASED_OUTPATIENT_CLINIC_OR_DEPARTMENT_OTHER)
Admit: 2015-10-18 | Discharge: 2015-10-18 | Disposition: A | Discharge status: Home / Self Care | Payer: Medicare HMO | Attending: Internal Medicine | Admitting: Internal Medicine

## 2015-10-18 ENCOUNTER — Observation Stay: Payer: Medicare HMO

## 2015-10-18 DIAGNOSIS — G459 Transient cerebral ischemic attack, unspecified: Secondary | ICD-10-CM | POA: Diagnosis not present

## 2015-10-18 LAB — TROPONIN I: TROPONIN I: 0.05 ng/mL — AB (ref <0.031)

## 2015-10-18 LAB — GLUCOSE, CAPILLARY
GLUCOSE-CAPILLARY: 83 mg/dL (ref 65–99)
Glucose-Capillary: 114 mg/dL — ABNORMAL HIGH (ref 65–99)

## 2015-10-18 LAB — BASIC METABOLIC PANEL
ANION GAP: 12 (ref 5–15)
BUN: 44 mg/dL — ABNORMAL HIGH (ref 6–20)
CO2: 24 mmol/L (ref 22–32)
Calcium: 8.4 mg/dL — ABNORMAL LOW (ref 8.9–10.3)
Chloride: 102 mmol/L (ref 101–111)
Creatinine, Ser: 1.74 mg/dL — ABNORMAL HIGH (ref 0.44–1.00)
GFR calc Af Amer: 32 mL/min — ABNORMAL LOW (ref >60)
GFR, EST NON AFRICAN AMERICAN: 28 mL/min — AB (ref >60)
GLUCOSE: 94 mg/dL (ref 65–99)
POTASSIUM: 4 mmol/L (ref 3.5–5.1)
Sodium: 138 mmol/L (ref 135–145)

## 2015-10-18 LAB — HEMOGLOBIN A1C: HEMOGLOBIN A1C: 5.3 % (ref 4.0–6.0)

## 2015-10-18 MED ORDER — CEPHALEXIN 500 MG PO CAPS: 500.0000 mg | ORAL_CAPSULE | Freq: Two times a day (BID) | ORAL | Status: DC

## 2015-10-18 MED ORDER — LISINOPRIL 10 MG PO TABS: 10.0000 mg | ORAL_TABLET | Freq: Every day | ORAL | Status: DC

## 2015-10-18 NOTE — Consult Note (Signed)
Referring Physician: Sudini    Chief Complaint: Difficulty with speech  HPI: Tamara Finley is an 75 y.o. female who reports that she was at home cleaning yesterday.  She started to feel badly and became very fatigued.  Her daughter then called her and she was unable to get her words out.  Symptoms lasted about 30 minutes then resolved.  Patient presented for evaluation at that time.  Patient has been seen quite a few times in the past few weeks due to headache and high blood pressure  Headache seems to have resolved.    Date last known well: 10/17/2015 Time last known well: Time: 10:00 tPA Given: No: Resolution of symptoms  Past Medical History  Diagnosis Date  . Hypertension   . Diabetes mellitus without complication ([place])   . COPD (chronic obstructive pulmonary disease) ([place])   . Renal disorder   . Thyroid disease     Past Surgical History  Procedure Laterality Date  . Cardiac surgery    . Abdominal aortic aneurysm repair      Family History  Problem Relation Age of Onset  . CVA Mother    Mother and sister with tremor.   Social History:  reports that she has quit smoking. She does not have any smokeless tobacco history on file. She reports that she does not drink alcohol. Her drug history is not on file.  Allergies: No Known Allergies  Medications:  I have reviewed the patient's current medications. Prior to Admission:  Prescriptions prior to admission  Medication Sig Dispense Refill Last Dose  . albuterol (PROVENTIL HFA;VENTOLIN HFA) 108 (90 BASE) MCG/ACT inhaler Inhale 2 puffs into the lungs every 6 (six) hours as needed for wheezing or shortness of breath.   PRN  . aspirin 81 MG chewable tablet Chew 81 mg by mouth daily.   10/17/2015 at am  . Calcium Carbonate-Vitamin D (CALCIUM 600+D) 600-400 MG-UNIT tablet Take 1 tablet by mouth 2 (two) times daily with a meal.   10/17/2015 at am  . cephALEXin (KEFLEX) 500 MG capsule Take 500 mg by mouth 3 (three) times daily. For 10  days   10/17/2015 at am  . cholecalciferol (VITAMIN D) 1000 units tablet Take 2,000 Units by mouth daily.   10/17/2015 at am  . doxepin (SINEQUAN) 25 MG capsule Take 25 mg by mouth at bedtime.   10/16/2015 at pm  . felodipine (PLENDIL) 10 MG 24 hr tablet Take 10 mg by mouth daily.   10/17/2015 at am  . fluticasone (FLONASE) 50 MCG/ACT nasal spray Place 1-2 sprays into both nostrils daily as needed for rhinitis.   PRN  . Fluticasone Furoate-Vilanterol (BREO ELLIPTA) 100-25 MCG/INH AEPB Inhale 1 puff into the lungs daily.   10/17/2015 at am  . hydrochlorothiazide (HYDRODIURIL) 25 MG tablet Take 25 mg by mouth daily.   10/17/2015 at am  . levothyroxine (SYNTHROID, LEVOTHROID) 88 MCG tablet Take 88 mcg by mouth daily before breakfast.   10/17/2015 at am  . metoprolol succinate (TOPROL-XL) 50 MG 24 hr tablet Take 50 mg by mouth daily.    10/17/2015 at 0900  . pantoprazole (PROTONIX) 40 MG tablet Take 40 mg by mouth daily.   10/17/2015 at am  . simvastatin (ZOCOR) 80 MG tablet Take 80 mg by mouth at bedtime.    10/16/2015 at pm  . sodium chloride (OCEAN) 0.65 % SOLN nasal spray Place 1-2 sprays into both nostrils as needed for congestion.   PRN  . tiZANidine (ZANAFLEX) 4 MG  tablet Take 4 mg by mouth every 6 (six) hours as needed for muscle spasms.   PRN  . traMADol (ULTRAM) 50 MG tablet Take 50 mg by mouth every 6 (six) hours as needed for moderate pain.    PRN  . amLODipine (NORVASC) 2.5 MG tablet Take 1 tablet (2.5 mg total) by mouth daily. (Patient not taking: Reported on 10/17/2015) 14 tablet 0   . amLODipine (NORVASC) 5 MG tablet Take 1 tablet (5 mg total) by mouth daily. (Patient not taking: Reported on 10/17/2015) 15 tablet 0 10/11/2015 at Unknown time   Scheduled: . aspirin EC  81 mg Oral Daily  . atorvastatin  40 mg Oral q1800  . budesonide-formoterol  2 puff Inhalation BID  . cephALEXin  500 mg Oral TID  . doxepin  25 mg Oral QHS  . enoxaparin (LOVENOX) injection  40 mg Subcutaneous Q24H  . felodipine   10 mg Oral Daily  . hydrochlorothiazide  25 mg Oral Daily  . insulin aspart  0-9 Units Subcutaneous TID WC  . levothyroxine  88 mcg Oral QAC breakfast  . lisinopril  10 mg Oral Daily  . metoprolol succinate  50 mg Oral Daily  . pantoprazole  40 mg Oral Daily  . sodium chloride flush  3 mL Intravenous Q12H  . sodium chloride flush  3 mL Intravenous Q12H    ROS: History obtained from the patient  General ROS: negative for - chills, fatigue, fever, night sweats, weight gain or weight loss Psychological ROS: memory difficulties Ophthalmic ROS: negative for - blurry vision, double vision, eye pain or loss of vision ENT ROS: negative for - epistaxis, nasal discharge, oral lesions, sore throat, tinnitus or vertigo Allergy and Immunology ROS: negative for - hives or itchy/watery eyes Hematological and Lymphatic ROS: negative for - bleeding problems, bruising or swollen lymph nodes Endocrine ROS: negative for - galactorrhea, hair pattern changes, polydipsia/polyuria or temperature intolerance Respiratory ROS: negative for - cough, hemoptysis, shortness of breath or wheezing Cardiovascular ROS: lower extremity edema  Gastrointestinal ROS: negative for - abdominal pain, diarrhea, hematemesis, nausea/vomiting or stool incontinence Genito-Urinary ROS: negative for - dysuria, hematuria, incontinence or urinary frequency/urgency Musculoskeletal ROS: negative for - joint swelling or muscular weakness Neurological ROS: as noted in HPI Dermatological ROS: negative for rash and skin lesion changes  Physical Examination: Blood pressure 141/58, pulse 63, temperature 97.8 F (36.6 C), temperature source Oral, resp. rate 17, weight 85.458 kg (188 lb 6.4 oz), SpO2 93 %.  HEENT-  Normocephalic, no lesions, without obvious abnormality.  Normal external eye and conjunctiva.  Normal TM's bilaterally.  Normal auditory canals and external ears. Normal external nose, mucus membranes and septum.  Normal  pharynx. Cardiovascular- S1, S2 normal, pulses palpable throughout   Lungs- chest clear, no wheezing, rales, normal symmetric air entry Abdomen- soft, non-tender; bowel sounds normal; no masses,  no organomegaly Extremities- mild LE edema Lymph-no adenopathy palpable Musculoskeletal-no joint tenderness, deformity or swelling Skin- extensive ecchymoses in the upper extremities  Neurological Examination Mental Status: Alert, oriented, thought content appropriate.  Speech fluent without evidence of aphasia.  Able to follow 3 step commands without difficulty. Cranial Nerves: II: Discs flat bilaterally; Visual fields grossly normal, pupils equal, round, reactive to light and accommodation III,IV, VI: ptosis not present, extra-ocular motions intact bilaterally V,VII: smile symmetric, facial light touch sensation normal bilaterally VIII: hearing normal bilaterally IX,X: gag reflex present XI: bilateral shoulder shrug XII: midline tongue extension Motor: Right : Upper extremity   5/5  Left:     Upper extremity   5/5  Lower extremity   5/5     Lower extremity   5/5 Tone and bulk:normal tone throughout; no atrophy noted Sensory: Pinprick and light touch intact throughout, bilaterally Deep Tendon Reflexes: 2+ and symmetric with absent AJ's bilaterally Plantars: Right: downgoing   Left: downgoing Cerebellar: Normal finger-to-nose and normal heel-to-shin testing bilaterally.  Essential tremor noted bilaterally Gait: not tested due to safety concerns     Laboratory Studies:  Basic Metabolic Panel:  Recent Labs Lab 10/12/15 0417 10/17/15 1253 10/18/15 0150  NA 137 135 138  K 3.2* 3.3* 4.0  CL 97* 93* 102  CO2 28 29 24   GLUCOSE 133* 131* 94  BUN 25* 44* 44*  CREATININE 1.21* 1.64* 1.74*  CALCIUM 9.4 8.8* 8.4*    Liver Function Tests:  Recent Labs Lab 10/17/15 1253  AST 28  ALT 17  ALKPHOS 64  BILITOT 0.7  PROT 7.3  ALBUMIN 3.7   No results for input(s): LIPASE,  AMYLASE in the last 168 hours. No results for input(s): AMMONIA in the last 168 hours.  CBC:  Recent Labs Lab 10/12/15 0417 10/17/15 1253  WBC 10.6 10.9  NEUTROABS  --  7.2*  HGB 14.0 13.8  HCT 42.3 42.1  MCV 86.5 87.2  PLT 210 234    Cardiac Enzymes:  Recent Labs Lab 10/12/15 0417 10/17/15 1253 10/17/15 1853 10/18/15 0150  TROPONINI 0.03 0.04* 0.05* 0.05*    BNP: Invalid input(s): POCBNP  CBG:  Recent Labs Lab 10/17/15 1302 10/17/15 1626 10/17/15 2117 10/18/15 0738 10/18/15 1110  GLUCAP 132* 106* 114* 83 114*    Microbiology: Results for orders placed or performed during the hospital encounter of 10/10/15  Urine culture     Status: None   Collection Time: 10/10/15  5:43 PM  Result Value Ref Range Status   Specimen Description URINE, RANDOM  Final   Special Requests NONE  Final   Culture >=100,000 COLONIES/mL ESCHERICHIA COLI  Final   Report Status 10/13/2015 FINAL  Final   Organism ID, Bacteria ESCHERICHIA COLI  Final      Susceptibility   Escherichia coli - MIC*    AMPICILLIN 8 SENSITIVE Sensitive     CEFAZOLIN <=4 SENSITIVE Sensitive     CEFTRIAXONE <=1 SENSITIVE Sensitive     CIPROFLOXACIN <=0.25 SENSITIVE Sensitive     GENTAMICIN <=1 SENSITIVE Sensitive     IMIPENEM <=0.25 SENSITIVE Sensitive     NITROFURANTOIN <=16 SENSITIVE Sensitive     TRIMETH/SULFA <=20 SENSITIVE Sensitive     AMPICILLIN/SULBACTAM 4 SENSITIVE Sensitive     PIP/TAZO <=4 SENSITIVE Sensitive     Extended ESBL NEGATIVE Sensitive     * >=100,000 COLONIES/mL ESCHERICHIA COLI    Coagulation Studies:  Recent Labs  10/17/15 1253  LABPROT 13.8  INR 1.04    Urinalysis:  Recent Labs Lab 10/17/15 1446  COLORURINE YELLOW*  LABSPEC 1.008  PHURINE 5.0  GLUCOSEU NEGATIVE  HGBUR NEGATIVE  BILIRUBINUR NEGATIVE  KETONESUR NEGATIVE  PROTEINUR NEGATIVE  NITRITE NEGATIVE  LEUKOCYTESUR NEGATIVE    Lipid Panel: No results found for: CHOL, TRIG, HDL, CHOLHDL, VLDL,  LDLCALC  HgbA1C:  Lab Results  Component Value Date   HGBA1C 5.3 10/17/2015    Urine Drug Screen:  No results found for: LABOPIA, COCAINSCRNUR, LABBENZ, AMPHETMU, THCU, LABBARB  Alcohol Level: No results for input(s): ETH in the last 168 hours.  Other results: EKG: sinus rhythm at 70 bpm.  Imaging: Dg Chest 2  View  10/17/2015  CLINICAL DATA:  Altered mental status.  Transient ischemic attack. EXAM: CHEST  2 VIEW COMPARISON:  10/12/2015 and 07/27/2015 FINDINGS: Heart size and pulmonary vascularity are normal and the lungs are clear. Coronary artery calcifications and extensive calcification in the thoracic aorta. No infiltrates or effusions. No acute osseous abnormality. IMPRESSION: No acute abnormality.  Aortic and coronary artery atherosclerosis. Electronically Signed   By: Lorriane Shire M.D.   On: 10/17/2015 13:08   Ct Head Wo Contrast  10/17/2015  CLINICAL DATA:  Acute onset slurred speech and aphasia this morning. Confusion. EXAM: CT HEAD WITHOUT CONTRAST TECHNIQUE: Contiguous axial images were obtained from the base of the skull through the vertex without intravenous contrast. COMPARISON:  10/10/2015 FINDINGS: There is no evidence of intracranial hemorrhage, brain edema, or other signs of acute infarction. There is no evidence of intracranial mass lesion or mass effect. No abnormal extraaxial fluid collections are identified. Mild cerebral atrophy and chronic small vessel disease are stable in appearance. No evidence of hydrocephalus. No skull abnormality identified. IMPRESSION: No acute intracranial abnormality. Mild cerebral atrophy and chronic small vessel disease. Electronically Signed   By: Earle Gell M.D.   On: 10/17/2015 12:52   Mr Brain Wo Contrast  10/18/2015  CLINICAL DATA:  75 year old female presenting with acute expressive aphasia episode, resolved. Hypertension recently difficult to control. 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 Initial encounter. EXAM: MRI HEAD WITHOUT CONTRAST TECHNIQUE: Multiplanar, multiecho pulse sequences of the brain and surrounding structures were obtained without intravenous contrast. COMPARISON:  Brain MRI 10/10/2015. Head and neck MRA 10/12/2015. Head CT without contrast 10/17/2015. FINDINGS: Major intracranial vascular flow voids are stable. No restricted diffusion to suggest acute infarction. No midline shift, mass effect, evidence of mass lesion, ventriculomegaly, extra-axial collection or acute intracranial hemorrhage. Cervicomedullary junction and pituitary are within normal limits. Negative visualized cervical spine. Patchy and scattered bilateral cerebral white matter T2 and FLAIR hyperintensity is stable. Configuration is nonspecific, as before there is some bilateral temporal lobe periventricular involvement. No cortical encephalomalacia or chronic cerebral blood products identified. Deep gray matter nuclei, brainstem, and cerebellum remain normal for age. Trace mastoid fluid is stable. Trace paranasal sinus mucosal thickening is stable. Negative orbit and scalp soft tissues. IMPRESSION: No acute intracranial abnormality. Stable noncontrast MRI appearance of the brain with moderate for age nonspecific cerebral white matter signal changes. Electronically Signed   By: Genevie Ann M.D.   On: 10/18/2015 09:37   US Carotid Bilateral  10/17/2015  CLINICAL DATA:  Cerebrovascular accident. EXAM: BILATERAL CAROTID DUPLEX ULTRASOUND TECHNIQUE: Pearline Cables scale imaging, color Doppler and duplex ultrasound were performed of bilateral carotid and vertebral arteries in the neck. COMPARISON:  None. FINDINGS: Criteria: Quantification of carotid stenosis is based on velocity parameters that correlate the residual internal carotid diameter with NASCET-based stenosis levels, using the diameter of the distal internal carotid lumen as the denominator for stenosis measurement. The following velocity measurements were  obtained: RIGHT ICA:  202/30 cm/sec CCA:  [id number] cm/sec SYSTOLIC ICA/CCA RATIO:  1.6 DIASTOLIC ICA/CCA RATIO:  1.2 ECA:  235 cm/sec LEFT ICA:  235/15 cm/sec CCA:  [id number] cm/sec SYSTOLIC ICA/CCA RATIO:  2.3 DIASTOLIC ICA/CCA RATIO:  0.7 ECA:  236 cm/sec RIGHT CAROTID ARTERY: Moderate calcified plaque is noted in the distal right common carotid artery. Moderate calcified plaque is also noted in the right carotid bulb and proximal right internal carotid are consistent with 50-69% stenosis based on ultrasound and Doppler criteria.  RIGHT VERTEBRAL ARTERY:  Antegrade flow is noted. LEFT CAROTID ARTERY: Mild calcified plaque is noted in the proximal left common carotid artery. Moderate calcified plaque is also noted in the distal left common carotid artery. Moderate to severe irregular and calcified plaque is noted in the left carotid bulb and proximal left internal carotid artery consistent with greater than 70% diameter stenosis based on ultrasound and Doppler criteria. LEFT VERTEBRAL ARTERY:  Antegrade flow is noted. IMPRESSION: Moderate calcified plaque is noted in the right carotid bulb and proximal right internal carotid artery consistent with 50-69% stenosis based on ultrasound and Doppler criteria. Moderate to severe calcified and irregular plaque formation is noted in the left carotid bulb and proximal left internal carotid artery consistent with greater than 70% diameter stenosis based on ultrasound and Doppler criteria. Electronically Signed   By: Marijo Conception, M.D.   On: 10/17/2015 15:48    Assessment: 75 y.o. female presenting after an episode of what is described as expressive aphasia.  Patient now at baseline.  MRI of the brain personally reviewed and shows no acute changes.  Carotid doppler shows 50-69% RICA stenosis and [id number] LICA stenosis.  This is not confirmed by MRA performed on 10/12/2015 that showed no hemodynamically significant stenosis.  Patient currently being followed [facility].   Echocardiogram pending.  A1c 5.3.   Patient on ASA at home but not taking regularly.    Stroke Risk Factors - diabetes mellitus and hypertension  Plan: 1. Fasting lipid panel 2. PT consult, OT consult, Speech consult 3. Echocardiogram pending 4. Prophylactic therapy-Antiplatelet med: Aspirin - dose 325mg  daily 5. Telemetry monitoring 6. Frequent neuro checks 7. Patient to keep follow up with [facility].     Alexis Goodell, MD Neurology [phone] 10/18/2015, 11:37 AM

## 2015-10-18 NOTE — Discharge Instructions (Signed)
°  DIET:  Cardiac diet  DISCHARGE CONDITION:  Stable  ACTIVITY:  Activity as tolerated  OXYGEN:  Home Oxygen: No.   Oxygen Delivery: room air  DISCHARGE LOCATION:  home   If you experience worsening of your admission symptoms, develop shortness of breath, life threatening emergency, suicidal or homicidal thoughts you must seek medical attention immediately by calling 911 or calling your MD immediately  if symptoms less severe.  You Must read complete instructions/literature along with all the possible adverse reactions/side effects for all the Medicines you take and that have been prescribed to you. Take any new Medicines after you have completely understood and accpet all the possible adverse reactions/side effects.   Please note  You were cared for by a hospitalist during your hospital stay. If you have any questions about your discharge medications or the care you received while you were in the hospital after you are discharged, you can call the unit and asked to speak with the hospitalist on call if the hospitalist that took care of you is not available. Once you are discharged, your primary care physician will handle any further medical issues. Please note that NO REFILLS for any discharge medications will be authorized once you are discharged, as it is imperative that you return to your primary care physician (or establish a relationship with a primary care physician if you do not have one) for your aftercare needs so that they can reassess your need for medications and monitor your lab values.   It is imporotant you take medications as prescribed.

## 2015-10-18 NOTE — Progress Notes (Signed)
*  PRELIMINARY RESULTS* Echocardiogram 2D Echocardiogram has been performed.  Tamara Finley 10/18/2015, 2:28 PM

## 2015-10-21 NOTE — Discharge Summary (Signed)
[facility] [place] NAME: Tamara Finley    MR#:  [id number]  DATE OF BIRTH:  30-Nov-1940  DATE OF ADMISSION:  10/17/2015 ADMITTING PHYSICIAN: Hillary Bow, MD  DATE OF DISCHARGE: 10/18/2015  5:06 PM  PRIMARY CARE PHYSICIAN: THIES, DAVID, MD    ADMISSION DIAGNOSIS:  Aphasia [R47.01] Confusion [R41.0] CVA (cerebral infarction) [I63.9] Transient cerebral ischemia, unspecified transient cerebral ischemia type [G45.9]  DISCHARGE DIAGNOSIS:  Active Problems:   TIA (transient ischemic attack)   SECONDARY DIAGNOSIS:   Past Medical History  Diagnosis Date  . Hypertension   . Diabetes mellitus without complication ([place])   . COPD (chronic obstructive pulmonary disease) ([place])   . Renal disorder   . Thyroid disease      ADMITTING HISTORY  Tamara Finley is a 75 y.o. female with a known history of hypertension, prediabetes, COPD presents to the emergency room with acute onset of expressive aphasia lasting about 30 minutes. Patient suddenly developed expressive aphasia unable to talk over the phone with daughter. This persisted and daughter arrived at the patient's home. On arrival by EMS patient was orthostatic with systolic blood pressure dropping from 135-77. She was given 500 ML normal saline bolus. Blood glucose was normal. Patient's symptoms resolved by the time she arrived at the emergency room. Symptoms resolved by the time patient arrived to the emergency room. Presently complains of some heaviness in the head and chest. 1/10 score.  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 After discussing with neurology patient was discharged home to follow-up as outpatient. No stroke on the MRI. Due to uncontrolled blood pressure and headache patient went to [facility] 2 days later  and was referred to her PCP. Her amlodipine was changed to felodipine by her primary care physician 2 days back along with adding Keflex for UTI. Presently has no dysuria or hematuria.   HOSPITAL COURSE:   * TIA Patient was admitted onto medical floor with telemetry. No arrhythmias found. Patient's symptoms had resolved prior to her arrival at the emergency room and was not a candidate for TPA. CT scan of the head was normal. MRI of the brain showed no acute strokes. Carotid Dopplers did show bilateral carotid stenosis left greater than right. Patient does have follow-up appointment with Dr. dew and was aware of the carotid stenosis previously. Seen by Dr. Jules Husbands of neurology recommended aspirin and statin and improved blood pressure control and follow up with vascular. Echo showed no source of embolus. Normal EF. Mild hypertrophy.  * Uncontrolled blood pressure Patient has had uncontrolled blood pressure prolonged time. Likely from some noncompliance. Lisinopril added and blood pressure is slowly improving.  * Hyper Lipidemia On statin  * CKD stage III stable  Patient stable for discharge home did not have any PT or OT needs.    CONSULTS OBTAINED:  Treatment Team:  Catarina Hartshorn, MD  DRUG ALLERGIES:  No Known Allergies  DISCHARGE MEDICATIONS:   Discharge Medication List as of 10/18/2015  4:35 PM    START taking these medications   Details  lisinopril (PRINIVIL,ZESTRIL) 10 MG tablet Take 1 tablet (10 mg total) by mouth daily., Starting 10/18/2015, Until Discontinued, Print      CONTINUE these medications which have NOT CHANGED   Details  albuterol (PROVENTIL HFA;VENTOLIN HFA) 108 (90 BASE) MCG/ACT inhaler  Inhale 2 puffs into the lungs every 6 (six) hours as needed for wheezing or shortness of breath., Until Discontinued, Historical Med    aspirin 81 MG chewable tablet Chew 81 mg by mouth daily., Until Discontinued, Historical Med    Calcium Carbonate-Vitamin D  (CALCIUM 600+D) 600-400 MG-UNIT tablet Take 1 tablet by mouth 2 (two) times daily with a meal., Until Discontinued, Historical Med    cephALEXin (KEFLEX) 500 MG capsule Take 500 mg by mouth 3 (three) times daily. For 10 days, Starting 10/15/2015, Until Discontinued, Historical Med    cholecalciferol (VITAMIN D) 1000 units tablet Take 2,000 Units by mouth daily., Until Discontinued, Historical Med    doxepin (SINEQUAN) 25 MG capsule Take 25 mg by mouth at bedtime., Until Discontinued, Historical Med    felodipine (PLENDIL) 10 MG 24 hr tablet Take 10 mg by mouth daily., Until Discontinued, Historical Med    fluticasone (FLONASE) 50 MCG/ACT nasal spray Place 1-2 sprays into both nostrils daily as needed for rhinitis., Until Discontinued, Historical Med    Fluticasone Furoate-Vilanterol (BREO ELLIPTA) 100-25 MCG/INH AEPB Inhale 1 puff into the lungs daily., Until Discontinued, Historical Med    hydrochlorothiazide (HYDRODIURIL) 25 MG tablet Take 25 mg by mouth daily., Until Discontinued, Historical Med    levothyroxine (SYNTHROID, LEVOTHROID) 88 MCG tablet Take 88 mcg by mouth daily before breakfast., Until Discontinued, Historical Med    metoprolol succinate (TOPROL-XL) 50 MG 24 hr tablet Take 50 mg by mouth daily. , Until Discontinued, Historical Med    pantoprazole (PROTONIX) 40 MG tablet Take 40 mg by mouth daily., Until Discontinued, Historical Med    simvastatin (ZOCOR) 80 MG tablet Take 80 mg by mouth at bedtime. , Until Discontinued, Historical Med    sodium chloride (OCEAN) 0.65 % SOLN nasal spray Place 1-2 sprays into both nostrils as needed for congestion., Until Discontinued, Historical Med    tiZANidine (ZANAFLEX) 4 MG tablet Take 4 mg by mouth every 6 (six) hours as needed for muscle spasms., Until Discontinued, Historical Med    traMADol (ULTRAM) 50 MG tablet Take 50 mg by mouth every 6 (six) hours as needed for moderate pain. , Until Discontinued, Historical Med      STOP  taking these medications     amLODipine (NORVASC) 2.5 MG tablet      amLODipine (NORVASC) 5 MG tablet        Today   VITAL SIGNS:  Blood pressure 167/70, pulse 92, temperature 97.6 F (36.4 C), temperature source Oral, resp. rate 17, height 5\' 6"  (1.676 m), weight 85.446 kg (188 lb 6 oz), SpO2 94 %.  I/O:  No intake or output data in the 24 hours ending 10/21/15 2123  PHYSICAL EXAMINATION:  Physical Exam  GENERAL:  75 y.o.-year-old patient lying in the bed with no acute distress.  LUNGS: Normal breath sounds bilaterally, no wheezing, rales,rhonchi or crepitation. No use of accessory muscles of respiration.  CARDIOVASCULAR: S1, S2 normal. No murmurs, rubs, or gallops.  ABDOMEN: Soft, non-tender, non-distended. Bowel sounds present. No organomegaly or mass.  NEUROLOGIC: Moves all 4 extremities. PSYCHIATRIC: The patient is alert and oriented x 3.  SKIN: No obvious rash, lesion, or ulcer.   DATA REVIEW:   CBC  Recent Labs Lab 10/17/15 1253  WBC 10.9  HGB 13.8  HCT 42.1  PLT 234    Chemistries   Recent Labs Lab 10/17/15 1253 10/18/15 0150  NA 135 138  K 3.3* 4.0  CL 93* 102  CO2 29 24  GLUCOSE 131* 94  BUN 44* 44*  CREATININE 1.64* 1.74*  CALCIUM 8.8* 8.4*  AST 28  --   ALT 17  --   ALKPHOS 64  --   BILITOT 0.7  --    Cardiac Enzymes  Recent Labs Lab 10/18/15 0150  TROPONINI 0.05*    Microbiology Results  Results for orders placed or performed during the hospital encounter of 10/10/15  Urine culture     Status: None   Collection Time: 10/10/15  5:43 PM  Result Value Ref Range Status   Specimen Description URINE, RANDOM  Final   Special Requests NONE  Final   Culture >=100,000 COLONIES/mL ESCHERICHIA COLI  Final   Report Status 10/13/2015 FINAL  Final   Organism ID, Bacteria ESCHERICHIA COLI  Final      Susceptibility   Escherichia coli - MIC*    AMPICILLIN 8 SENSITIVE Sensitive     CEFAZOLIN <=4 SENSITIVE Sensitive     CEFTRIAXONE <=1  SENSITIVE Sensitive     CIPROFLOXACIN <=0.25 SENSITIVE Sensitive     GENTAMICIN <=1 SENSITIVE Sensitive     IMIPENEM <=0.25 SENSITIVE Sensitive     NITROFURANTOIN <=16 SENSITIVE Sensitive     TRIMETH/SULFA <=20 SENSITIVE Sensitive     AMPICILLIN/SULBACTAM 4 SENSITIVE Sensitive     PIP/TAZO <=4 SENSITIVE Sensitive     Extended ESBL NEGATIVE Sensitive     * >=100,000 COLONIES/mL ESCHERICHIA COLI    RADIOLOGY:  No results found.  Follow up with PCP in 1 week.  Management plans discussed with the patient, family and they are in agreement.  CODE STATUS:  Code Status History    Date Active Date Inactive Code Status Order ID Comments User Context   10/17/2015  2:37 PM 10/18/2015  8:25 PM Full Code [id number]  Hillary Bow, MD ED     TOTAL TIME TAKING CARE OF THIS PATIENT ON DAY OF DISCHARGE: more than 30 minutes.   Hillary Bow R M.D on 10/21/2015 at 9:23 PM  Between 7am to 6pm - Pager - [phone]  After 6pm go to [email or web address] - password [email or web address] [facility]  Office  [phone]  CC: Primary care physician; Ezequiel Kayser, MD  Note: This dictation was prepared with Dragon dictation along with smaller phrase technology. Any transcriptional errors that result from this process are unintentional.

## 2015-10-25 DIAGNOSIS — I6523 Occlusion and stenosis of bilateral carotid arteries: Secondary | ICD-10-CM | POA: Insufficient documentation

## 2015-10-25 DIAGNOSIS — Z8673 Personal history of transient ischemic attack (TIA), and cerebral infarction without residual deficits: Secondary | ICD-10-CM | POA: Insufficient documentation

## 2015-12-04 ENCOUNTER — Other Ambulatory Visit: Payer: Self-pay | Admitting: Vascular Surgery

## 2015-12-05 ENCOUNTER — Encounter
Admission: AD | Disposition: A | Discharge status: Home / Self Care | Payer: Self-pay | Source: Ambulatory Visit | Attending: Vascular Surgery

## 2015-12-05 ENCOUNTER — Other Ambulatory Visit: Payer: Self-pay | Admitting: Vascular Surgery

## 2015-12-05 ENCOUNTER — Inpatient Hospital Stay
Admission: AD | Admit: 2015-12-05 | Discharge: 2015-12-06 | DRG: 674 | Disposition: A | Discharge status: Home / Self Care | Payer: MEDICARE | Source: Ambulatory Visit | Attending: Vascular Surgery | Admitting: Vascular Surgery

## 2015-12-05 DIAGNOSIS — E785 Hyperlipidemia, unspecified: Secondary | ICD-10-CM | POA: Diagnosis not present

## 2015-12-05 DIAGNOSIS — J449 Chronic obstructive pulmonary disease, unspecified: Secondary | ICD-10-CM | POA: Diagnosis not present

## 2015-12-05 DIAGNOSIS — K219 Gastro-esophageal reflux disease without esophagitis: Secondary | ICD-10-CM | POA: Diagnosis not present

## 2015-12-05 DIAGNOSIS — Z79899 Other long term (current) drug therapy: Secondary | ICD-10-CM

## 2015-12-05 DIAGNOSIS — Z841 Family history of disorders of kidney and ureter: Secondary | ICD-10-CM | POA: Diagnosis not present

## 2015-12-05 DIAGNOSIS — Z7982 Long term (current) use of aspirin: Secondary | ICD-10-CM | POA: Diagnosis not present

## 2015-12-05 DIAGNOSIS — N183 Chronic kidney disease, stage 3 (moderate): Secondary | ICD-10-CM | POA: Diagnosis present

## 2015-12-05 DIAGNOSIS — I10 Essential (primary) hypertension: Secondary | ICD-10-CM | POA: Diagnosis present

## 2015-12-05 DIAGNOSIS — M1990 Unspecified osteoarthritis, unspecified site: Secondary | ICD-10-CM | POA: Diagnosis not present

## 2015-12-05 DIAGNOSIS — Z8249 Family history of ischemic heart disease and other diseases of the circulatory system: Secondary | ICD-10-CM

## 2015-12-05 DIAGNOSIS — Z87891 Personal history of nicotine dependence: Secondary | ICD-10-CM | POA: Diagnosis not present

## 2015-12-05 DIAGNOSIS — N2581 Secondary hyperparathyroidism of renal origin: Secondary | ICD-10-CM | POA: Diagnosis not present

## 2015-12-05 DIAGNOSIS — Z7951 Long term (current) use of inhaled steroids: Secondary | ICD-10-CM

## 2015-12-05 DIAGNOSIS — I129 Hypertensive chronic kidney disease with stage 1 through stage 4 chronic kidney disease, or unspecified chronic kidney disease: Secondary | ICD-10-CM | POA: Diagnosis present

## 2015-12-05 DIAGNOSIS — Z833 Family history of diabetes mellitus: Secondary | ICD-10-CM

## 2015-12-05 DIAGNOSIS — Z86010 Personal history of colonic polyps: Secondary | ICD-10-CM | POA: Diagnosis not present

## 2015-12-05 DIAGNOSIS — I739 Peripheral vascular disease, unspecified: Secondary | ICD-10-CM | POA: Diagnosis not present

## 2015-12-05 DIAGNOSIS — I6523 Occlusion and stenosis of bilateral carotid arteries: Secondary | ICD-10-CM

## 2015-12-05 DIAGNOSIS — I701 Atherosclerosis of renal artery: Secondary | ICD-10-CM | POA: Diagnosis not present

## 2015-12-05 DIAGNOSIS — Z823 Family history of stroke: Secondary | ICD-10-CM

## 2015-12-05 DIAGNOSIS — I158 Other secondary hypertension: Secondary | ICD-10-CM | POA: Diagnosis not present

## 2015-12-05 DIAGNOSIS — E039 Hypothyroidism, unspecified: Secondary | ICD-10-CM | POA: Diagnosis present

## 2015-12-05 DIAGNOSIS — E1122 Type 2 diabetes mellitus with diabetic chronic kidney disease: Secondary | ICD-10-CM | POA: Diagnosis present

## 2015-12-05 DIAGNOSIS — Z888 Allergy status to other drugs, medicaments and biological substances status: Secondary | ICD-10-CM

## 2015-12-05 DIAGNOSIS — I150 Renovascular hypertension: Secondary | ICD-10-CM | POA: Diagnosis present

## 2015-12-05 DIAGNOSIS — Z809 Family history of malignant neoplasm, unspecified: Secondary | ICD-10-CM

## 2015-12-05 HISTORY — PX: PERIPHERAL VASCULAR CATHETERIZATION: SHX172C

## 2015-12-05 SURGERY — RENAL ANGIOGRAPHY
Anesthesia: Moderate Sedation

## 2015-12-05 MED ORDER — LIDOCAINE HCL (PF) 1 % IJ SOLN
INTRAMUSCULAR | Status: AC
  Filled 2015-12-05: qty 30

## 2015-12-05 MED ORDER — MIDAZOLAM HCL 2 MG/2ML IJ SOLN
INTRAMUSCULAR | Status: DC | PRN
  Administered 2015-12-05: 1 mg via INTRAVENOUS
  Administered 2015-12-05: 2 mg via INTRAVENOUS

## 2015-12-05 MED ORDER — CEPHALEXIN 500 MG PO CAPS
500.0000 mg | ORAL_CAPSULE | Freq: Three times a day (TID) | ORAL | Status: DC
Start: 2015-12-05 — End: 2015-12-06
  Administered 2015-12-05 – 2015-12-06 (×4): 500 mg via ORAL
  Filled 2015-12-05 (×4): qty 1

## 2015-12-05 MED ORDER — ATORVASTATIN CALCIUM 20 MG PO TABS
40.0000 mg | ORAL_TABLET | Freq: Every day | ORAL | Status: DC
  Filled 2015-12-05: qty 2
  Filled 2015-12-05 (×2): qty 1

## 2015-12-05 MED ORDER — AMLODIPINE BESYLATE 5 MG PO TABS
ORAL_TABLET | ORAL | Status: AC
  Filled 2015-12-05: qty 1

## 2015-12-05 MED ORDER — SODIUM BICARBONATE 8.4 % IV SOLN
INTRAVENOUS | Status: DC
  Filled 2015-12-05: qty 500

## 2015-12-05 MED ORDER — MIDAZOLAM HCL 5 MG/5ML IJ SOLN
INTRAMUSCULAR | Status: AC
  Filled 2015-12-05: qty 5

## 2015-12-05 MED ORDER — HYDROMORPHONE HCL 1 MG/ML IJ SOLN
INTRAMUSCULAR | Status: AC
  Filled 2015-12-05: qty 1

## 2015-12-05 MED ORDER — MAGNESIUM 30 MG PO TABS: 250.0000 mg | ORAL_TABLET | Freq: Every day | ORAL | Status: DC

## 2015-12-05 MED ORDER — ACETAMINOPHEN 325 MG PO TABS: 325.0000 mg | ORAL_TABLET | ORAL | Status: DC | PRN

## 2015-12-05 MED ORDER — FAMOTIDINE 20 MG PO TABS: 40.0000 mg | ORAL_TABLET | ORAL | Status: DC | PRN

## 2015-12-05 MED ORDER — LIDOCAINE HCL (PF) 1 % IJ SOLN
INTRAMUSCULAR | Status: DC | PRN
  Administered 2015-12-05: 5 mL via INTRADERMAL

## 2015-12-05 MED ORDER — OXYCODONE HCL 5 MG PO TABS
5.0000 mg | ORAL_TABLET | ORAL | Status: DC | PRN
  Administered 2015-12-06: 10 mg via ORAL
  Filled 2015-12-05: qty 2

## 2015-12-05 MED ORDER — HYDROMORPHONE HCL 1 MG/ML IJ SOLN
1.0000 mg | Freq: Once | INTRAMUSCULAR | Status: DC
  Administered 2015-12-05: 1 mg via INTRAVENOUS

## 2015-12-05 MED ORDER — LISINOPRIL 10 MG PO TABS
ORAL_TABLET | ORAL | Status: AC
  Filled 2015-12-05: qty 1

## 2015-12-05 MED ORDER — ONDANSETRON HCL 4 MG/2ML IJ SOLN: 4.0000 mg | Freq: Four times a day (QID) | INTRAMUSCULAR | Status: DC | PRN

## 2015-12-05 MED ORDER — HEPARIN (PORCINE) IN NACL 2-0.9 UNIT/ML-% IJ SOLN
INTRAMUSCULAR | Status: AC
  Filled 2015-12-05: qty 500

## 2015-12-05 MED ORDER — TIZANIDINE HCL 4 MG PO TABS
4.0000 mg | ORAL_TABLET | Freq: Four times a day (QID) | ORAL | Status: DC | PRN
Start: 2015-12-05 — End: 2015-12-06

## 2015-12-05 MED ORDER — TRAMADOL HCL 50 MG PO TABS: 50.0000 mg | ORAL_TABLET | Freq: Four times a day (QID) | ORAL | Status: DC | PRN

## 2015-12-05 MED ORDER — ALBUTEROL SULFATE HFA 108 (90 BASE) MCG/ACT IN AERS: 2.0000 | INHALATION_SPRAY | Freq: Four times a day (QID) | RESPIRATORY_TRACT | Status: DC | PRN

## 2015-12-05 MED ORDER — SALINE SPRAY 0.65 % NA SOLN
1.0000 | NASAL | Status: DC | PRN
  Filled 2015-12-05: qty 44

## 2015-12-05 MED ORDER — LEVOTHYROXINE SODIUM 88 MCG PO TABS
88.0000 ug | ORAL_TABLET | Freq: Every day | ORAL | Status: DC
  Administered 2015-12-06: 88 ug via ORAL
  Filled 2015-12-05: qty 1

## 2015-12-05 MED ORDER — LABETALOL HCL 5 MG/ML IV SOLN
INTRAVENOUS | Status: DC | PRN
  Administered 2015-12-05: 10 mg via INTRAVENOUS

## 2015-12-05 MED ORDER — FELODIPINE ER 10 MG PO TB24: 10.0000 mg | ORAL_TABLET | Freq: Every day | ORAL | Status: DC

## 2015-12-05 MED ORDER — SODIUM BICARBONATE BOLUS VIA INFUSION
INTRAVENOUS | Status: AC
  Administered 2015-12-05: 14:00:00 via INTRAVENOUS
  Filled 2015-12-05: qty 1

## 2015-12-05 MED ORDER — AMLODIPINE BESYLATE 5 MG PO TABS
5.0000 mg | ORAL_TABLET | Freq: Every day | ORAL | Status: DC
  Administered 2015-12-05: 5 mg via ORAL

## 2015-12-05 MED ORDER — HYDRALAZINE HCL 25 MG PO TABS
25.0000 mg | ORAL_TABLET | Freq: Three times a day (TID) | ORAL | Status: DC
  Administered 2015-12-05 (×2): 25 mg via ORAL
  Filled 2015-12-05 (×2): qty 1

## 2015-12-05 MED ORDER — HEPARIN SODIUM (PORCINE) 1000 UNIT/ML IJ SOLN
INTRAMUSCULAR | Status: DC | PRN
  Administered 2015-12-05: 4000 [IU] via INTRAVENOUS

## 2015-12-05 MED ORDER — FLUTICASONE FUROATE-VILANTEROL 100-25 MCG/INH IN AEPB
1.0000 | INHALATION_SPRAY | Freq: Every day | RESPIRATORY_TRACT | Status: DC
Start: 2015-12-05 — End: 2015-12-06
  Filled 2015-12-05: qty 28

## 2015-12-05 MED ORDER — LABETALOL HCL 5 MG/ML IV SOLN
INTRAVENOUS | Status: AC
  Filled 2015-12-05: qty 4

## 2015-12-05 MED ORDER — ASPIRIN 81 MG PO CHEW
81.0000 mg | CHEWABLE_TABLET | Freq: Every day | ORAL | Status: DC
  Administered 2015-12-06: 81 mg via ORAL
  Filled 2015-12-05: qty 1

## 2015-12-05 MED ORDER — FENTANYL CITRATE (PF) 100 MCG/2ML IJ SOLN
INTRAMUSCULAR | Status: AC
  Filled 2015-12-05: qty 2

## 2015-12-05 MED ORDER — HYDRALAZINE HCL 20 MG/ML IJ SOLN
INTRAMUSCULAR | Status: AC
  Filled 2015-12-05: qty 1

## 2015-12-05 MED ORDER — MAGNESIUM OXIDE 400 (241.3 MG) MG PO TABS
400.0000 mg | ORAL_TABLET | Freq: Every day | ORAL | Status: DC
  Administered 2015-12-06: 400 mg via ORAL
  Filled 2015-12-05: qty 1

## 2015-12-05 MED ORDER — METOPROLOL SUCCINATE ER 50 MG PO TB24
50.0000 mg | ORAL_TABLET | Freq: Every day | ORAL | Status: DC
  Administered 2015-12-06: 50 mg via ORAL
  Filled 2015-12-05: qty 1

## 2015-12-05 MED ORDER — CALCIUM CARBONATE-VITAMIN D 500-200 MG-UNIT PO TABS
1.0000 | ORAL_TABLET | Freq: Two times a day (BID) | ORAL | Status: DC
  Administered 2015-12-06 (×2): 1 via ORAL
  Filled 2015-12-05 (×3): qty 1

## 2015-12-05 MED ORDER — AMLODIPINE BESYLATE 5 MG PO TABS
5.0000 mg | ORAL_TABLET | Freq: Every day | ORAL | Status: DC
Start: 2015-12-06 — End: 2015-12-06

## 2015-12-05 MED ORDER — FLUTICASONE PROPIONATE 50 MCG/ACT NA SUSP
1.0000 | Freq: Every day | NASAL | Status: DC | PRN
  Filled 2015-12-05: qty 16

## 2015-12-05 MED ORDER — FENTANYL CITRATE (PF) 100 MCG/2ML IJ SOLN
INTRAMUSCULAR | Status: DC | PRN
  Administered 2015-12-05 (×2): 50 ug via INTRAVENOUS

## 2015-12-05 MED ORDER — LISINOPRIL 10 MG PO TABS
10.0000 mg | ORAL_TABLET | Freq: Every day | ORAL | Status: DC
  Administered 2015-12-05: 10 mg via ORAL

## 2015-12-05 MED ORDER — SODIUM CHLORIDE 0.9 % IV SOLN
INTRAVENOUS | Status: DC
  Administered 2015-12-05: 14:00:00 via INTRAVENOUS

## 2015-12-05 MED ORDER — DEXTROSE 5 % IV SOLN
1.5000 g | INTRAVENOUS | Status: AC
  Administered 2015-12-05: 1.5 g via INTRAVENOUS

## 2015-12-05 MED ORDER — ALBUTEROL SULFATE (2.5 MG/3ML) 0.083% IN NEBU: 2.5000 mg | INHALATION_SOLUTION | Freq: Four times a day (QID) | RESPIRATORY_TRACT | Status: DC | PRN

## 2015-12-05 MED ORDER — VITAMIN D 1000 UNITS PO TABS
2000.0000 [IU] | ORAL_TABLET | Freq: Every day | ORAL | Status: DC
  Administered 2015-12-06: 2000 [IU] via ORAL
  Filled 2015-12-05 (×3): qty 2

## 2015-12-05 MED ORDER — HYDROCHLOROTHIAZIDE 25 MG PO TABS: 25.0000 mg | ORAL_TABLET | Freq: Every day | ORAL | Status: DC

## 2015-12-05 MED ORDER — IOPAMIDOL (ISOVUE-300) INJECTION 61%
INTRAVENOUS | Status: DC | PRN
  Administered 2015-12-05: 40 mL via INTRA_ARTERIAL

## 2015-12-05 MED ORDER — CLONIDINE HCL 0.1 MG PO TABS: 0.2000 mg | ORAL_TABLET | ORAL | Status: DC | PRN

## 2015-12-05 MED ORDER — ACETAMINOPHEN 325 MG RE SUPP
325.0000 mg | RECTAL | Status: DC | PRN
  Filled 2015-12-05: qty 2

## 2015-12-05 MED ORDER — DOXEPIN HCL 25 MG PO CAPS
25.0000 mg | ORAL_CAPSULE | Freq: Every day | ORAL | Status: DC
  Administered 2015-12-05: 25 mg via ORAL
  Filled 2015-12-05 (×2): qty 1

## 2015-12-05 MED ORDER — OMEGA-3-ACID ETHYL ESTERS 1 G PO CAPS
1.0000 g | ORAL_CAPSULE | Freq: Every day | ORAL | Status: DC
  Administered 2015-12-06: 1 g via ORAL
  Filled 2015-12-05: qty 1

## 2015-12-05 MED ORDER — MORPHINE SULFATE (PF) 4 MG/ML IV SOLN
2.0000 mg | INTRAVENOUS | Status: DC | PRN
  Administered 2015-12-05: 4 mg via INTRAVENOUS
  Filled 2015-12-05: qty 1

## 2015-12-05 MED ORDER — CALCIUM CARBONATE-VITAMIN D 600-400 MG-UNIT PO TABS: 1.0000 | ORAL_TABLET | Freq: Two times a day (BID) | ORAL | Status: DC

## 2015-12-05 MED ORDER — ALUM & MAG HYDROXIDE-SIMETH 200-200-20 MG/5ML PO SUSP: 15.0000 mL | ORAL | Status: DC | PRN

## 2015-12-05 MED ORDER — PANTOPRAZOLE SODIUM 40 MG PO TBEC
40.0000 mg | DELAYED_RELEASE_TABLET | Freq: Every day | ORAL | Status: DC
  Administered 2015-12-06: 40 mg via ORAL
  Filled 2015-12-05: qty 1

## 2015-12-05 MED ORDER — METHYLPREDNISOLONE SODIUM SUCC 125 MG IJ SOLR: 125.0000 mg | INTRAMUSCULAR | Status: DC | PRN

## 2015-12-05 SURGICAL SUPPLY — 19 items
BAG DECANTER STRL (MISCELLANEOUS) ×2 IMPLANT
BALLN ARMADA 6X20X80 (BALLOONS) ×2
BALLOON ARMADA 6X20X80 (BALLOONS) ×1 IMPLANT
CATH C2 65CM (CATHETERS) ×2 IMPLANT
CATH KA2 5FR 65CM (CATHETERS) ×2 IMPLANT
CATH MHK2 5FR 80CM (CATHETERS) ×2 IMPLANT
CATH PIG 70CM (CATHETERS) ×2 IMPLANT
DEVICE PRESTO INFLATION (MISCELLANEOUS) ×2 IMPLANT
GUIDEWIRE ANGLED .035 180CM (WIRE) ×2 IMPLANT
PACK ANGIOGRAPHY (CUSTOM PROCEDURE TRAY) ×2 IMPLANT
SET INTRO CAPELLA COAXIAL (SET/KITS/TRAYS/PACK) ×2 IMPLANT
SHEATH ANL 5FRX45 (SHEATH) ×2 IMPLANT
SHEATH ANL2 6FRX45 HC (SHEATH) ×2 IMPLANT
SHEATH BRITE TIP 5FRX11 (SHEATH) ×2 IMPLANT
STENT ICAST 5X22X80 (Permanent Stent) ×2 IMPLANT
SYR MEDRAD MARK V 150ML (SYRINGE) ×2 IMPLANT
TUBING CONTRAST HIGH PRESS 72 (TUBING) ×2 IMPLANT
WIRE J 3MM .035X145CM (WIRE) ×2 IMPLANT
WIRE MAGIC TOR.035 180C (WIRE) ×2 IMPLANT

## 2015-12-05 NOTE — Op Note (Signed)
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 Ultrasound guidance for vascular access right femoral artery 2. Catheter placement into left renal artery from right femoral approach 3. Aortogram and selective left renal angiogram 4. Atrium balloon expandable stent placement to  the left renal artery with a 6 mm diameter x 22 mm length stent Atrium covered stent 5. StarClose closure device failed converted to manual hold right femoral artery  Contrast: 40 cc  EBL: Minimal   Fluoro Time: 16.6 minutes  Moderate conscious sedation: Versed and Fentanyl administered IV. Conscious sedation was for 50 minutes.  Indications: The patient is a 75 year old woman with worsening severe hypertension despite 5 drug therapy. The patient has suboptimal blood pressure control despite multiple antihypertensives and a noninvasive study demonstrating hemodynamically significant recurrent left renal artery stenosis. Given the clinical scenario and the noninvasive findings, angiogram is indicated for further evaluation of her renal artery and potential treatment. Risks and benefits are discussed and informed consent is obtained.  Procedure: The patient was identified and appropriate procedural time out was performed. The patient was then placed supine on the table and prepped and draped in the usual sterile fashion.Moderate conscious sedation was administered throughout the procedure with my supervision of the RN administering medicines and monitoring the patients vital signs and mental status  throughout from the start of the procedure until the patient was taken to the recovery room  Ultrasound was used to evaluate the right common femoral artery. It was patent . A digital ultrasound image was acquired. A Seldinger needle was used to access the right common femoral artery under direct ultrasound guidance and a permanent image was performed. A 0.035 J wire was advanced without resistance and a 5Fr sheath was placed. Pigtail catheter was placed into the aorta at the L1 level and an AP aortogram was performed. This demonstrated 70% recurrent stenosis at the ostia of the left renal artery. The patient was then systemically heparinized with 4000 units of intravenous heparin. I used a KMP catheter to cannulate the left renal artery and selective imaging was performed. This confirmed a 70% stenosis of the left renal artery.  At this point I selected the Magic torque wire and crossed the lesion without difficulty.  5 French sheath was then exchanged for a 6 [place] Ansell to sheath which was advanced through the lesion over the wire.  I then selected a 5 mm diameter x 22 mm length balloon expandable Atrium stent and brought this across the lesion. The stent was then uncovered by pulling the sheath back.  This was deployed encompassing the lesion with its proximal extent going back into the aorta for a mm or two.  This was inflated to 16 ATM and the waist did not fully resolve.    Subsequently, a 6 x 2 [place] balloon was advanced over the wire across the lesion and the stent was postdilated to 6 mm at 20 atm for approximately 30 seconds. This did result in complete resolution of the previously noted waist. Completion angiogram was then performed which demonstrated wide patency with preservation of the distal runoff.  The guide catheter was removed. Oblique arteriogram was performed of the right femoral artery and StarClose closure device was deployed in the usual fashion which did not yield hemostasis  and therefore manual pressure was held for 20 minutes. The patient was taken to the recovery room in stable condition having tolerated the procedure well.  Findings:  Aortogram/Renal Arteries:The aorta is noted to have severe atherosclerotic occlusive disease and an aortobiiliac bypass graft is noted. The structures are patent. The right renal artery is patent as well however there is essentially no nephrogram identified (kidney measured 6 cm on the last ultrasound).  Left renal artery and kidney are identified kidney is normal in size with a normal nephrogram previously placed stent is identified. On magnified selective imaging there appears to be a 70% stenosis. This is treated with restenting using a balloon expandable covered stent postdilated to 6 mm with excellent result.   Condition:  Stable  Complications: None   Katha Cabal 12/05/2015 3:46 PM

## 2015-12-05 NOTE — Progress Notes (Signed)
Pt clinically stable, given dose norvasc/lisinopril for sbp as charted, also received dilaudid fo r pain, 6/10 lower back pain, resting at present time, with 3/10 now, no further shaking from before, family members at bedside,. No bleeding nor hematoma at right groin site, pad device with 15ml  Air instilled to prevent bleeding. Dr Delana Meyer to speak with pt and famliy  Prior to pt going to room, report called to 2c with orders and plan discussed. Pt alert and oriented, report called to [place] on 2c. Still receiving bicarb gtt for gfr, taking po's without difficulty

## 2015-12-05 NOTE — Discharge Instructions (Signed)
Angiogram, Care After °Refer to this sheet in the next few weeks. These instructions provide you with information about caring for yourself after your procedure. Your health care provider may also give you more specific instructions. Your treatment has been planned according to current medical practices, but problems sometimes occur. Call your health care provider if you have any problems or questions after your procedure. °WHAT TO EXPECT AFTER THE PROCEDURE °After your procedure, it is typical to have the following: °· Bruising at the catheter insertion site that usually fades within 1-2 weeks. °· Blood collecting in the tissue (hematoma) that may be painful to the touch. It should usually decrease in size and tenderness within 1-2 weeks. °HOME CARE INSTRUCTIONS °· Take medicines only as directed by your health care provider. °· You may shower 24-48 hours after the procedure or as directed by your health care provider. Remove the bandage (dressing) and gently wash the site with plain soap and water. Pat the area dry with a clean towel. Do not rub the site, because this may cause bleeding. °· Do not take baths, swim, or use a hot tub until your health care provider approves. °· Check your insertion site every day for redness, swelling, or drainage. °· Do not apply powder or lotion to the site. °· Do not lift over 10 lb (4.5 kg) for 5 days after your procedure or as directed by your health care provider. °· Ask your health care provider when it is okay to: °¨ Return to work or school. °¨ Resume usual physical activities or sports. °¨ Resume sexual activity. °· Do not drive home if you are discharged the same day as the procedure. Have someone else drive you. °· You may drive 24 hours after the procedure unless otherwise instructed by your health care provider. °· Do not operate machinery or power tools for 24 hours after the procedure or as directed by your health care provider. °· If your procedure was done as an  outpatient procedure, which means that you went home the same day as your procedure, a responsible adult should be with you for the first 24 hours after you arrive home. °· Keep all follow-up visits as directed by your health care provider. This is important. °SEEK MEDICAL CARE IF: °· You have a fever. °· You have chills. °· You have increased bleeding from the catheter insertion site. Hold pressure on the site. °SEEK IMMEDIATE MEDICAL CARE IF: °· You have unusual pain at the catheter insertion site. °· You have redness, warmth, or swelling at the catheter insertion site. °· You have drainage (other than a small amount of blood on the dressing) from the catheter insertion site. °· The catheter insertion site is bleeding, and the bleeding does not stop after 30 minutes of holding steady pressure on the site. °· The area near or just beyond the catheter insertion site becomes pale, cool, tingly, or numb. °  °This information is not intended to replace advice given to you by your health care provider. Make sure you discuss any questions you have with your health care provider. °  °Document Released: 03/06/2005 Document Revised: 09/08/2014 Document Reviewed: 01/19/2013 °Elsevier Interactive Patient Education ©2016 [employer] ° °

## 2015-12-06 ENCOUNTER — Encounter: Payer: Self-pay | Admitting: Vascular Surgery

## 2015-12-06 DIAGNOSIS — I10 Essential (primary) hypertension: Secondary | ICD-10-CM | POA: Diagnosis present

## 2015-12-06 DIAGNOSIS — I701 Atherosclerosis of renal artery: Secondary | ICD-10-CM | POA: Diagnosis not present

## 2015-12-06 DIAGNOSIS — I129 Hypertensive chronic kidney disease with stage 1 through stage 4 chronic kidney disease, or unspecified chronic kidney disease: Secondary | ICD-10-CM | POA: Diagnosis not present

## 2015-12-06 LAB — BASIC METABOLIC PANEL
Anion gap: 5 (ref 5–15)
BUN: 19 mg/dL (ref 6–20)
CALCIUM: 8.2 mg/dL — AB (ref 8.9–10.3)
CHLORIDE: 100 mmol/L — AB (ref 101–111)
CO2: 28 mmol/L (ref 22–32)
CREATININE: 1.53 mg/dL — AB (ref 0.44–1.00)
GFR calc non Af Amer: 33 mL/min — ABNORMAL LOW (ref >60)
GFR, EST AFRICAN AMERICAN: 38 mL/min — AB (ref >60)
Glucose, Bld: 163 mg/dL — ABNORMAL HIGH (ref 65–99)
Potassium: 3 mmol/L — ABNORMAL LOW (ref 3.5–5.1)
SODIUM: 133 mmol/L — AB (ref 135–145)

## 2015-12-06 MED ORDER — POTASSIUM CHLORIDE 20 MEQ PO PACK
20.0000 meq | PACK | Freq: Once | ORAL | Status: AC
  Administered 2015-12-06: 20 meq via ORAL
  Filled 2015-12-06: qty 1

## 2015-12-06 NOTE — Progress Notes (Signed)
Patient discharged to home as ordered. Follow up appointments given as ordered. Patient right groin site clean and dry no S/S of infection noted. Patient husband at the bedside to take patient home

## 2015-12-06 NOTE — Progress Notes (Signed)
Subjective:  Patient known to [place] from outpatient.  She is followed for renal artery stenosis, hypertension and chronic kidney disease. Patient has known atrophied right kidney. Left kidney had renal artery stenosis, greater than 60% She had difficult to control hypertension outpatient. She was admitted for evaluation. She underwent left renal artery stenting with a 6 x 22 mm stent Patient did well with the procedure. Her blood pressure is much better controlled now.  Objective:  Vital signs in last 24 hours:  Temp:  [97.6 F (36.4 C)-97.9 F (36.6 C)] 97.9 F (36.6 C) (04/06 0503) Pulse Rate:  [73-115] 110 (04/06 0956) Resp:  [14-20] 20 (04/06 0503) BP: (79-192)/(50-107) 130/60 mmHg (04/06 0956) SpO2:  [92 %-100 %] 94 % (04/06 0510) Weight:  [79.379 kg (175 lb)] 79.379 kg (175 lb) (04/05 1321)  Weight change:  Filed Weights   12/05/15 1321  Weight: 79.379 kg (175 lb)    Intake/Output:    Intake/Output Summary (Last 24 hours) at 12/06/15 1042 Last data filed at 12/06/15 1017  Gross per 24 hour  Intake    360 ml  Output   1300 ml  Net   -940 ml     Physical Exam: General: No acute distress, laying in the bed   HEENT Anicteric, moist oral mucous membranes   Neck Supple   Pulm/lungs Lungs are clear to auscultation   CVS/Heart Regular with ectopic beats, no rub   Abdomen:  Soft, nontender   Extremities: No peripheral edema   Neurologic: Alert, oriented   Skin: No acute rashes   Access:        Basic Metabolic Panel:  No results for input(s): NA, K, CL, CO2, GLUCOSE, BUN, CREATININE, CALCIUM, MG, PHOS in the last 168 hours.   CBC: No results for input(s): WBC, NEUTROABS, HGB, HCT, MCV, PLT in the last 168 hours.    Microbiology:  No results found for this or any previous visit (from the past 720 hour(s)).  Coagulation Studies: No results for input(s): LABPROT, INR in the last 72 hours.  Urinalysis: No results for input(s): COLORURINE, LABSPEC, PHURINE,  GLUCOSEU, HGBUR, BILIRUBINUR, KETONESUR, PROTEINUR, UROBILINOGEN, NITRITE, LEUKOCYTESUR in the last 72 hours.  Invalid input(s): APPERANCEUR    Imaging: No results found.   Medications:     . aspirin  81 mg Oral Daily  . atorvastatin  40 mg Oral q1800  . calcium-vitamin D  1 tablet Oral BID WC  . cephALEXin  500 mg Oral TID  . cholecalciferol  2,000 Units Oral Daily  . doxepin  25 mg Oral QHS  . fluticasone furoate-vilanterol  1 puff Inhalation Daily  . levothyroxine  88 mcg Oral QAC breakfast  . magnesium oxide  400 mg Oral Daily  . metoprolol succinate  50 mg Oral Daily  . omega-3 acid ethyl esters  1 g Oral Daily  . pantoprazole  40 mg Oral Daily   acetaminophen **OR** acetaminophen, albuterol, alum & mag hydroxide-simeth, cloNIDine, fluticasone, morphine injection, ondansetron, oxyCODONE, sodium chloride, tiZANidine, traMADol  Assessment/ Plan:  75 y.o. female with past medical history of hypertension, hypothyroidism, osteoarthritis, peripheral vascular disease, bilateral renal artery stenosis status post left renal artery stent placement 11/13/14, COPD, diabetes mellitus type 2, abdominal aortic aneurysm status post open repair in 2010, GERD, hyperlipidemia, history of adenomatous colonic polyps, and depression   1. 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 Last  dose was at 4:00 last evening - Lisinopril, amlodipine, HCTZ have been held [facility] monitor her blood pressure during the day. If it remains below [id number] systolic, would discharge the patient on Toprol alone. - If blood pressure tends to rise, would adjust the regimen to metoprolol and amlodipine - Prefer to add lisinopril as outpatient    2. 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

## 2015-12-19 ENCOUNTER — Ambulatory Visit
Admission: RE | Admit: 2015-12-19 | Discharge: 2015-12-19 | Disposition: A | Discharge status: Home / Self Care | Payer: Medicare HMO | Source: Ambulatory Visit | Attending: Vascular Surgery | Admitting: Vascular Surgery

## 2015-12-19 DIAGNOSIS — J439 Emphysema, unspecified: Secondary | ICD-10-CM | POA: Diagnosis not present

## 2015-12-19 DIAGNOSIS — I6523 Occlusion and stenosis of bilateral carotid arteries: Secondary | ICD-10-CM

## 2015-12-19 LAB — POCT I-STAT CREATININE: CREATININE: 1.2 mg/dL — AB (ref 0.44–1.00)

## 2015-12-19 MED ORDER — IOPAMIDOL (ISOVUE-370) INJECTION 76%
75.0000 mL | Freq: Once | INTRAVENOUS | Status: AC | PRN
  Administered 2015-12-19: 75 mL via INTRAVENOUS

## 2015-12-25 ENCOUNTER — Other Ambulatory Visit: Payer: Self-pay | Admitting: Vascular Surgery

## 2015-12-28 NOTE — Discharge Summary (Signed)
[facility]    Discharge Summary    Patient ID:  Tamara Finley MRN: [id number] DOB/AGE: June 29, 1941 75 y.o.  Admit date: 12/05/2015 Discharge date: 12/28/2015 Date of Surgery: 12/05/2015 Surgeon: Surgeon(s): Katha Cabal, MD  Admission Diagnosis: Renal Angio     Renal Artery Stenosis  Discharge Diagnoses:  Renal Angio     Renal Artery Stenosis  Secondary Diagnoses: Past Medical History  Diagnosis Date  . Hypertension   . Diabetes mellitus without complication ([place])   . COPD (chronic obstructive pulmonary disease) ([place])   . Renal disorder   . Thyroid disease     Procedure(s): Renal Angiography  Discharged Condition: good  HPI:  Patient presented for treatment of her renal artery stenosis which was successfully accomplished yesterday she is doing well and is felt fit for discharge.  Hospital Course:  Tamara Finley is a 75 y.o. female is S/P Left renal artery stent Procedure(s): Renal Angiography Extubated: POD # not applicable Physical exam: Right groin clean dry and intact Post-op wounds clean, dry, intact or healing well Pt. Ambulating, voiding and taking PO diet without difficulty. Pt pain controlled with PO pain meds. Labs as below Complications:none  Consults:     Significant Diagnostic Studies: CBC Lab Results  Component Value Date   WBC 10.9 10/17/2015   HGB 13.8 10/17/2015   HCT 42.1 10/17/2015   MCV 87.2 10/17/2015   PLT 234 10/17/2015    BMET    Component Value Date/Time   NA 133* 12/06/2015 1006   K 3.0* 12/06/2015 1006   CL 100* 12/06/2015 1006   CO2 28 12/06/2015 1006   GLUCOSE 163* 12/06/2015 1006   BUN 19 12/06/2015 1006   BUN 23* 10/18/2012 0815   CREATININE 1.20* 12/19/2015 1123   CREATININE 1.15 10/18/2012 0815   CALCIUM 8.2* 12/06/2015 1006   GFRNONAA 33* 12/06/2015 1006   GFRNONAA 48* 10/18/2012 0815   GFRAA 38* 12/06/2015 1006   GFRAA 56* 10/18/2012 0815   COAG Lab Results  Component  Value Date   INR 1.04 10/17/2015   INR 1.19 10/12/2015     Disposition:  Discharge to :Home Discharge Instructions    Call MD for:  redness, tenderness, or signs of infection (pain, swelling, bleeding, redness, odor or green/yellow discharge around incision site)    Complete by:  As directed      Call MD for:  severe or increased pain, loss or decreased feeling  in affected limb(s)    Complete by:  As directed      Call MD for:  temperature >100.5    Complete by:  As directed      Lifting restrictions    Complete by:  As directed   No lifting for 1 week     Resume previous diet    Complete by:  As directed             Medication List    STOP taking these medications        hydrALAZINE 25 MG tablet  Commonly known as:  APRESOLINE     hydrochlorothiazide 25 MG tablet  Commonly known as:  HYDRODIURIL      TAKE these medications        albuterol 108 (90 Base) MCG/ACT inhaler  Commonly known as:  PROVENTIL HFA;VENTOLIN HFA  Inhale 2 puffs into the lungs every 6 (six) hours as needed for wheezing or shortness of breath. Reported on 12/05/2015     amLODipine 5 MG tablet  Commonly known as:  NORVASC  Take 5 mg by mouth daily.     aspirin 81 MG chewable tablet  Chew 81 mg by mouth daily.     BREO ELLIPTA 100-25 MCG/INH Aepb  Generic drug:  fluticasone furoate-vilanterol  Inhale 1 puff into the lungs daily. Reported on 12/05/2015     CALCIUM 600+D 600-400 MG-UNIT tablet  Generic drug:  Calcium Carbonate-Vitamin D  Take 1 tablet by mouth 2 (two) times daily with a meal.     cephALEXin 500 MG capsule  Commonly known as:  KEFLEX  Take 500 mg by mouth 3 (three) times daily. Reported on 12/05/2015     cholecalciferol 1000 units tablet  Commonly known as:  VITAMIN D  Take 2,000 Units by mouth daily.     doxepin 25 MG capsule  Commonly known as:  SINEQUAN  Take 25 mg by mouth at bedtime.     felodipine 10 MG 24 hr tablet  Commonly known as:  PLENDIL  Take 10 mg by mouth  daily. Reported on 12/05/2015     fluticasone 50 MCG/ACT nasal spray  Commonly known as:  FLONASE  Place 1-2 sprays into both nostrils daily as needed for rhinitis.     levothyroxine 88 MCG tablet  Commonly known as:  SYNTHROID, LEVOTHROID  Take 88 mcg by mouth daily before breakfast.     lisinopril 10 MG tablet  Commonly known as:  PRINIVIL,ZESTRIL  Take 1 tablet (10 mg total) by mouth daily.     magnesium 30 MG tablet  Take 250 mg by mouth daily.     metoprolol succinate 50 MG 24 hr tablet  Commonly known as:  TOPROL-XL  Take 50 mg by mouth daily. Reported on 12/05/2015     omega-3 acid ethyl esters 1 g capsule  Commonly known as:  LOVAZA  Take 1 g by mouth daily.     pantoprazole 40 MG tablet  Commonly known as:  PROTONIX  Take 40 mg by mouth daily.     simvastatin 80 MG tablet  Commonly known as:  ZOCOR  Take 80 mg by mouth at bedtime. Reported on 12/05/2015     sodium chloride 0.65 % Soln nasal spray  Commonly known as:  OCEAN  Place 1-2 sprays into both nostrils as needed for congestion.     tiZANidine 4 MG tablet  Commonly known as:  ZANAFLEX  Take 4 mg by mouth every 6 (six) hours as needed for muscle spasms. Reported on 12/05/2015     traMADol 50 MG tablet  Commonly known as:  ULTRAM  Take 50 mg by mouth every 6 (six) hours as needed for moderate pain.       Verbal and written Discharge instructions given to the patient. Wound care per Discharge AVS     Follow-up Information    Follow up with LATEEF, Inocente Salles, MD On 12/19/2015.   Specialty:  Internal Medicine   Why:  @8 :20am   follow up after procedure   Contact information:   [place] [address] [phone]       Follow up with DEW,JASON, MD On 12/21/2015.   Specialties:  Vascular Surgery, Radiology, Interventional Cardiology   Why:  @11am     follow up after procedure   Contact information:   [place] [address] [phone]       Signed: Katha Cabal, MD  12/28/2015, 2:44 PM

## 2015-12-31 ENCOUNTER — Encounter
Admission: AD | Disposition: A | Discharge status: Home / Self Care | Payer: Self-pay | Source: Ambulatory Visit | Attending: Vascular Surgery

## 2015-12-31 ENCOUNTER — Inpatient Hospital Stay
Admission: AD | Admit: 2015-12-31 | Discharge: 2016-01-01 | DRG: 035 | Disposition: A | Discharge status: Home / Self Care | Payer: Medicare HMO | Source: Ambulatory Visit | Attending: Vascular Surgery | Admitting: Vascular Surgery

## 2015-12-31 DIAGNOSIS — E119 Type 2 diabetes mellitus without complications: Secondary | ICD-10-CM | POA: Diagnosis present

## 2015-12-31 DIAGNOSIS — I2510 Atherosclerotic heart disease of native coronary artery without angina pectoris: Secondary | ICD-10-CM | POA: Diagnosis present

## 2015-12-31 DIAGNOSIS — J449 Chronic obstructive pulmonary disease, unspecified: Secondary | ICD-10-CM | POA: Diagnosis present

## 2015-12-31 DIAGNOSIS — E785 Hyperlipidemia, unspecified: Secondary | ICD-10-CM | POA: Diagnosis present

## 2015-12-31 DIAGNOSIS — I772 Rupture of artery: Secondary | ICD-10-CM | POA: Diagnosis present

## 2015-12-31 DIAGNOSIS — I6522 Occlusion and stenosis of left carotid artery: Secondary | ICD-10-CM | POA: Diagnosis present

## 2015-12-31 DIAGNOSIS — I10 Essential (primary) hypertension: Secondary | ICD-10-CM | POA: Diagnosis present

## 2015-12-31 DIAGNOSIS — E7800 Pure hypercholesterolemia, unspecified: Secondary | ICD-10-CM | POA: Diagnosis present

## 2015-12-31 DIAGNOSIS — I6529 Occlusion and stenosis of unspecified carotid artery: Secondary | ICD-10-CM | POA: Diagnosis present

## 2015-12-31 HISTORY — PX: PERIPHERAL VASCULAR CATHETERIZATION: SHX172C

## 2015-12-31 HISTORY — DX: Abdominal aortic aneurysm, without rupture, unspecified: I71.40

## 2015-12-31 HISTORY — DX: Pure hypercholesterolemia, unspecified: E78.00

## 2015-12-31 HISTORY — DX: Abdominal aortic aneurysm, without rupture: I71.4

## 2015-12-31 LAB — GLUCOSE, CAPILLARY: Glucose-Capillary: 94 mg/dL (ref 65–99)

## 2015-12-31 LAB — BUN: BUN: 17 mg/dL (ref 6–20)

## 2015-12-31 LAB — MRSA PCR SCREENING: MRSA by PCR: NEGATIVE

## 2015-12-31 LAB — CREATININE, SERUM
Creatinine, Ser: 1.4 mg/dL — ABNORMAL HIGH (ref 0.44–1.00)
GFR, EST AFRICAN AMERICAN: 42 mL/min — AB (ref >60)
GFR, EST NON AFRICAN AMERICAN: 36 mL/min — AB (ref >60)

## 2015-12-31 SURGERY — CAROTID PTA/STENT INTERVENTION
Anesthesia: Moderate Sedation | Laterality: Left

## 2015-12-31 MED ORDER — FAMOTIDINE 20 MG PO TABS: 40.0000 mg | ORAL_TABLET | ORAL | Status: DC | PRN

## 2015-12-31 MED ORDER — DOXEPIN HCL 25 MG PO CAPS
25.0000 mg | ORAL_CAPSULE | Freq: Every day | ORAL | Status: DC
  Administered 2015-12-31: 25 mg via ORAL
  Filled 2015-12-31: qty 1

## 2015-12-31 MED ORDER — ONDANSETRON HCL 4 MG/2ML IJ SOLN: 4.0000 mg | Freq: Four times a day (QID) | INTRAMUSCULAR | Status: DC | PRN

## 2015-12-31 MED ORDER — DOPAMINE-DEXTROSE 3.2-5 MG/ML-% IV SOLN
INTRAVENOUS | Status: AC
  Filled 2015-12-31: qty 250

## 2015-12-31 MED ORDER — PHENYLEPHRINE HCL 10 MG/ML IJ SOLN
INTRAMUSCULAR | Status: AC
  Filled 2015-12-31: qty 1

## 2015-12-31 MED ORDER — ACETAMINOPHEN 325 MG RE SUPP
325.0000 mg | RECTAL | Status: DC | PRN
  Filled 2015-12-31: qty 2

## 2015-12-31 MED ORDER — CLOPIDOGREL BISULFATE 75 MG PO TABS
75.0000 mg | ORAL_TABLET | Freq: Every day | ORAL | Status: DC
  Administered 2015-12-31: 75 mg via ORAL
  Filled 2015-12-31: qty 1

## 2015-12-31 MED ORDER — DOPAMINE-DEXTROSE 3.2-5 MG/ML-% IV SOLN: 3.0000 ug/kg/min | INTRAVENOUS | Status: DC

## 2015-12-31 MED ORDER — OXYCODONE-ACETAMINOPHEN 5-325 MG PO TABS: 1.0000 | ORAL_TABLET | ORAL | Status: DC | PRN

## 2015-12-31 MED ORDER — HEPARIN (PORCINE) IN NACL 2-0.9 UNIT/ML-% IJ SOLN
INTRAMUSCULAR | Status: AC
  Filled 2015-12-31: qty 1000

## 2015-12-31 MED ORDER — IOPAMIDOL (ISOVUE-300) INJECTION 61%
INTRAVENOUS | Status: DC | PRN
Start: 2015-12-31 — End: 2015-12-31
  Administered 2015-12-31: 95 mL via INTRAVENOUS

## 2015-12-31 MED ORDER — SODIUM ACETATE 2 MEQ/ML IV SOLN: INTRAVENOUS | Status: DC

## 2015-12-31 MED ORDER — SODIUM CHLORIDE 0.9 % IV SOLN
INTRAVENOUS | Status: DC
  Administered 2015-12-31 – 2016-01-01 (×2): via INTRAVENOUS

## 2015-12-31 MED ORDER — MAGNESIUM OXIDE 400 (241.3 MG) MG PO TABS: 400.0000 mg | ORAL_TABLET | Freq: Every day | ORAL | Status: DC

## 2015-12-31 MED ORDER — FAMOTIDINE IN NACL 20-0.9 MG/50ML-% IV SOLN
20.0000 mg | Freq: Two times a day (BID) | INTRAVENOUS | Status: DC
  Filled 2015-12-31 (×2): qty 50

## 2015-12-31 MED ORDER — PANTOPRAZOLE SODIUM 40 MG PO TBEC: 40.0000 mg | DELAYED_RELEASE_TABLET | Freq: Every day | ORAL | Status: DC

## 2015-12-31 MED ORDER — SALINE SPRAY 0.65 % NA SOLN
1.0000 | NASAL | Status: DC | PRN
  Filled 2015-12-31: qty 44

## 2015-12-31 MED ORDER — MAGNESIUM SULFATE 2 GM/50ML IV SOLN: 2.0000 g | Freq: Every day | INTRAVENOUS | Status: DC | PRN

## 2015-12-31 MED ORDER — SODIUM CHLORIDE 0.9 % IV SOLN
INTRAVENOUS | Status: DC
  Administered 2015-12-31: 08:00:00 via INTRAVENOUS

## 2015-12-31 MED ORDER — CALCIUM CARBONATE-VITAMIN D 500-200 MG-UNIT PO TABS
1.0000 | ORAL_TABLET | Freq: Two times a day (BID) | ORAL | Status: DC
  Administered 2015-12-31: 1 via ORAL
  Filled 2015-12-31 (×2): qty 1

## 2015-12-31 MED ORDER — FENTANYL CITRATE (PF) 100 MCG/2ML IJ SOLN
INTRAMUSCULAR | Status: AC
  Filled 2015-12-31: qty 4

## 2015-12-31 MED ORDER — MIDAZOLAM HCL 2 MG/2ML IJ SOLN
INTRAMUSCULAR | Status: DC | PRN
  Administered 2015-12-31: 2 mg via INTRAVENOUS

## 2015-12-31 MED ORDER — MORPHINE SULFATE (PF) 4 MG/ML IV SOLN
2.0000 mg | INTRAVENOUS | Status: DC | PRN
  Administered 2015-12-31: 2 mg via INTRAVENOUS

## 2015-12-31 MED ORDER — HYDROCHLOROTHIAZIDE 25 MG PO TABS
25.0000 mg | ORAL_TABLET | Freq: Every day | ORAL | Status: DC
Start: 2015-12-31 — End: 2016-01-01

## 2015-12-31 MED ORDER — MORPHINE SULFATE (PF) 2 MG/ML IV SOLN
INTRAVENOUS | Status: AC
  Filled 2015-12-31: qty 1

## 2015-12-31 MED ORDER — HYDROMORPHONE HCL 1 MG/ML IJ SOLN: 1.0000 mg | Freq: Once | INTRAMUSCULAR | Status: DC

## 2015-12-31 MED ORDER — HEPARIN SODIUM (PORCINE) 1000 UNIT/ML IJ SOLN
INTRAMUSCULAR | Status: AC
  Filled 2015-12-31: qty 1

## 2015-12-31 MED ORDER — METOPROLOL TARTRATE 5 MG/5ML IV SOLN
2.0000 mg | INTRAVENOUS | Status: DC | PRN
Start: 2015-12-31 — End: 2016-01-01

## 2015-12-31 MED ORDER — HYDRALAZINE HCL 25 MG PO TABS
25.0000 mg | ORAL_TABLET | Freq: Three times a day (TID) | ORAL | Status: DC
  Administered 2015-12-31 (×2): 25 mg via ORAL
  Filled 2015-12-31 (×2): qty 1

## 2015-12-31 MED ORDER — VITAMIN D 1000 UNITS PO TABS: 2000.0000 [IU] | ORAL_TABLET | Freq: Every day | ORAL | Status: DC

## 2015-12-31 MED ORDER — PHENOL 1.4 % MT LIQD
1.0000 | OROMUCOSAL | Status: DC | PRN
  Filled 2015-12-31: qty 177

## 2015-12-31 MED ORDER — TRAMADOL HCL 50 MG PO TABS
50.0000 mg | ORAL_TABLET | Freq: Four times a day (QID) | ORAL | Status: DC | PRN
Start: 2015-12-31 — End: 2016-01-01
  Administered 2015-12-31 – 2016-01-01 (×3): 50 mg via ORAL
  Filled 2015-12-31 (×3): qty 1

## 2015-12-31 MED ORDER — FLUTICASONE FUROATE-VILANTEROL 100-25 MCG/INH IN AEPB
1.0000 | INHALATION_SPRAY | Freq: Every day | RESPIRATORY_TRACT | Status: DC
  Filled 2015-12-31: qty 28

## 2015-12-31 MED ORDER — AMLODIPINE BESYLATE 5 MG PO TABS: 5.0000 mg | ORAL_TABLET | Freq: Every day | ORAL | Status: DC

## 2015-12-31 MED ORDER — ASPIRIN 81 MG PO CHEW: 81.0000 mg | CHEWABLE_TABLET | Freq: Every day | ORAL | Status: DC

## 2015-12-31 MED ORDER — MIDAZOLAM HCL 5 MG/ML IJ SOLN
INTRAMUSCULAR | Status: DC | PRN
  Administered 2015-12-31: 1 mg via INTRAVENOUS

## 2015-12-31 MED ORDER — FAMOTIDINE 20 MG PO TABS
20.0000 mg | ORAL_TABLET | Freq: Two times a day (BID) | ORAL | Status: DC
  Administered 2015-12-31 (×2): 20 mg via ORAL
  Filled 2015-12-31 (×2): qty 1

## 2015-12-31 MED ORDER — METOPROLOL SUCCINATE ER 50 MG PO TB24: 50.0000 mg | ORAL_TABLET | Freq: Every day | ORAL | Status: DC

## 2015-12-31 MED ORDER — LEVOTHYROXINE SODIUM 88 MCG PO TABS
88.0000 ug | ORAL_TABLET | Freq: Every day | ORAL | Status: DC
  Administered 2016-01-01: 88 ug via ORAL
  Filled 2015-12-31: qty 1

## 2015-12-31 MED ORDER — POTASSIUM CHLORIDE CRYS ER 20 MEQ PO TBCR: 20.0000 meq | EXTENDED_RELEASE_TABLET | Freq: Every day | ORAL | Status: DC | PRN

## 2015-12-31 MED ORDER — ALBUTEROL SULFATE (2.5 MG/3ML) 0.083% IN NEBU: 2.5000 mg | INHALATION_SOLUTION | Freq: Four times a day (QID) | RESPIRATORY_TRACT | Status: DC | PRN

## 2015-12-31 MED ORDER — FENTANYL CITRATE (PF) 100 MCG/2ML IJ SOLN
INTRAMUSCULAR | Status: DC | PRN
  Administered 2015-12-31: 25 ug via INTRAVENOUS
  Administered 2015-12-31: 50 ug via INTRAVENOUS

## 2015-12-31 MED ORDER — LIDOCAINE-EPINEPHRINE (PF) 1 %-1:200000 IJ SOLN
INTRAMUSCULAR | Status: AC
  Filled 2015-12-31: qty 30

## 2015-12-31 MED ORDER — SODIUM ACETATE 2 MEQ/ML IV SOLN
INTRAVENOUS | Status: DC
  Filled 2015-12-31: qty 1000

## 2015-12-31 MED ORDER — ACETAMINOPHEN 325 MG PO TABS: 325.0000 mg | ORAL_TABLET | ORAL | Status: DC | PRN

## 2015-12-31 MED ORDER — HEPARIN SODIUM (PORCINE) 1000 UNIT/ML IJ SOLN
INTRAMUSCULAR | Status: DC | PRN
  Administered 2015-12-31 (×2): 3000 [IU] via INTRAVENOUS

## 2015-12-31 MED ORDER — ALUM & MAG HYDROXIDE-SIMETH 200-200-20 MG/5ML PO SUSP: 15.0000 mL | ORAL | Status: DC | PRN

## 2015-12-31 MED ORDER — ATORVASTATIN CALCIUM 20 MG PO TABS
40.0000 mg | ORAL_TABLET | Freq: Every day | ORAL | Status: DC
  Administered 2015-12-31: 40 mg via ORAL
  Filled 2015-12-31: qty 2

## 2015-12-31 MED ORDER — GUAIFENESIN-DM 100-10 MG/5ML PO SYRP: 15.0000 mL | ORAL_SOLUTION | ORAL | Status: DC | PRN

## 2015-12-31 MED ORDER — FLUTICASONE PROPIONATE 50 MCG/ACT NA SUSP
1.0000 | Freq: Every day | NASAL | Status: DC | PRN
  Filled 2015-12-31: qty 16

## 2015-12-31 MED ORDER — OMEGA-3-ACID ETHYL ESTERS 1 G PO CAPS: 1.0000 g | ORAL_CAPSULE | Freq: Every day | ORAL | Status: DC

## 2015-12-31 MED ORDER — DEXTROSE 5 % IV SOLN
1.5000 g | Freq: Two times a day (BID) | INTRAVENOUS | Status: AC
  Administered 2015-12-31 – 2016-01-01 (×2): 1.5 g via INTRAVENOUS
  Filled 2015-12-31 (×2): qty 1.5

## 2015-12-31 MED ORDER — TIZANIDINE HCL 4 MG PO TABS: 4.0000 mg | ORAL_TABLET | Freq: Four times a day (QID) | ORAL | Status: DC | PRN

## 2015-12-31 MED ORDER — DEXTROSE 5 % IV SOLN: 1.5000 g | INTRAVENOUS | Status: DC

## 2015-12-31 MED ORDER — SODIUM CHLORIDE 0.9 % IV SOLN: 500.0000 mL | Freq: Once | INTRAVENOUS | Status: DC | PRN

## 2015-12-31 MED ORDER — METHYLPREDNISOLONE SODIUM SUCC 125 MG IJ SOLR: 125.0000 mg | INTRAMUSCULAR | Status: DC | PRN

## 2015-12-31 MED ORDER — MIDAZOLAM HCL 5 MG/5ML IJ SOLN
INTRAMUSCULAR | Status: AC
  Filled 2015-12-31: qty 5

## 2015-12-31 MED ORDER — ALBUTEROL SULFATE HFA 108 (90 BASE) MCG/ACT IN AERS: 2.0000 | INHALATION_SPRAY | Freq: Four times a day (QID) | RESPIRATORY_TRACT | Status: DC | PRN

## 2015-12-31 MED ORDER — FELODIPINE ER 10 MG PO TB24: 10.0000 mg | ORAL_TABLET | Freq: Every day | ORAL | Status: DC

## 2015-12-31 SURGICAL SUPPLY — 17 items
BALLN VIATRAC 5X20X135 (BALLOONS) ×2
BALLOON VIATRAC 5X20X135 (BALLOONS) ×1 IMPLANT
CATH ANGIO 5F 100CM .035 PIG (CATHETERS) ×2 IMPLANT
CATH H1 100CM (CATHETERS) ×2 IMPLANT
COVER PROBE U/S 5X48 (MISCELLANEOUS) ×2 IMPLANT
DEVICE EMBOSHIELD NAV6 4.0-7.0 (WIRE) ×2 IMPLANT
DEVICE PRESTO INFLATION (MISCELLANEOUS) ×2 IMPLANT
DEVICE STARCLOSE SE CLOSURE (Vascular Products) ×2 IMPLANT
DEVICE TORQUE (MISCELLANEOUS) ×2 IMPLANT
GUIDEWIRE AMPLATZ SS .035X260 (WIRE) ×2 IMPLANT
GUIDEWIRE ANGLED .035X260CM (WIRE) ×2 IMPLANT
KIT CAROTID MANIFOLD (MISCELLANEOUS) ×2 IMPLANT
SHEATH BRITE TIP 5FRX11 (SHEATH) ×2 IMPLANT
SHEATH BRITE TIP 6FRX11 (SHEATH) ×2 IMPLANT
SHEATH SHUTTLE SELECT 6F (SHEATH) ×2 IMPLANT
STENT XACT CAR 9-7X30X136 (Stent) ×2 IMPLANT
WIRE J 3MM .035X145CM (WIRE) ×2 IMPLANT

## 2015-12-31 NOTE — Progress Notes (Signed)
14 fr foley inserted without difficulty.  Return of clear yellow urine noted.  Patient tolerated well.

## 2015-12-31 NOTE — Progress Notes (Signed)
Resting quietly after carotid stenting. Some headache and neck pain, but not severe Access site C/d/I AF/VSS Check labs in am

## 2015-12-31 NOTE — H&P (Signed)
   [facility] History & Physical Update  The patient was interviewed and re-examined.  The patient's previous History and Physical has been reviewed and is unchanged.  There is no change in the plan of care. We plan to proceed with the scheduled procedure.  Berl Bonfanti, MD  12/31/2015, 7:57 AM

## 2015-12-31 NOTE — Op Note (Signed)
OPERATIVE NOTE DATE: 12/31/2015  PROCEDURE: 1.  ultrasound guidance for vascular access right femoral artery 2.  Placement of a 9 mm proximal 7 mm distal 3 cm long tapered Exact stent with the use of the NAV-6 embolic protection device in the left carotid artery  PRE-OPERATIVE DIAGNOSIS: 1. High-grade weblike carotid artery stenosis. 2. Hypertension 3. Hyperlipidemia. 4. Coronary artery disease  POST-OPERATIVE DIAGNOSIS:  Same as above  SURGEON: Leotis Pain, MD  ASSISTANT(S):  none  ANESTHESIA: local/MCS  ESTIMATED BLOOD LOSS:  50 cc  CONTRAST: 95 cc  FLUORO TIME: 8.2 minutes  MODERATE CONSCIOUS SEDATION TIME:  Approximately 45 minutes using 3 mg of Versed and 100 mcg of Fentanyl  FINDING(S): 1.   80-85% weblike left carotid artery stenosis with apparent ulceration  SPECIMEN(S):   none  INDICATIONS:   Patient is a 75 year old female who presents with  left carotid artery stenosis.  Her duplex suggested high-grade stenosis whereas the CT scan showed a weblike stenosis which was difficult to characterize. The patient has need for the angiogram to delineate the degree of stenosis and stenting could be performed at this time and carotid artery stenting was felt to be preferred to endarterectomy for that reason.  Risks and benefits were discussed and informed consent was obtained.   DESCRIPTION: After obtaining full informed written consent, the patient was brought back to the vascular suite and placed supine upon the table.  The patient received IV antibiotics prior to induction.Moderate conscious sedation was administered during a face to face encounter with the patient throughout the procedure with my supervision of the RN administering medicines and monitoring the patients vital signs and mental status throughout from the start of the procedure until the patient was taken to the recovery room.  After obtaining adequate anesthesia, the patient was prepped and draped in the  standard fashion.   The right femoral artery was visualized with ultrasound and found to be widely patent. It was then accessed under direct ultrasound guidance without difficulty with a Seldinger needle. A permanent image was recorded. A J-wire was placed and we then placed a 6 French sheath. The patient was then heparinized and a total of 6000 units of intravenous heparin were given and an ACT was checked to confirm successful anticoagulation. A pigtail catheter was then placed into the ascending aorta. This showed normal origins of the great vessels without significant proximal stenosis. I then selectively cannulated the left common carotid artery without difficulty with a H1 catheter and advanced into the mid left common carotid artery.  Cervical and cerebral carotid angiography was then performed. There were no obvious intracranial filling defects with somewhat sluggish flow in the anterior cerebral artery. The carotid bifurcation demonstrated a very irregular weblike stenosis at the origin of the internal carotid artery measuring about 80-85%. There was ulceration in the carotid bulb with a lesser degree of stenosis in the distal common carotid artery creating an ulceration between the weblike stenosis in the common carotid artery stenosis lesion.  I then advanced into the external carotid artery with a Glidewire and the H1 catheter and then exchanged for the Amplatz Super Stiff wire. Over the Amplatz Super Stiff wire, a 6 [place] shuttle sheath was placed into the mid common carotid artery. I then used the NAV-6  Embolic protection device and crossed the lesion and parked this in the distal internal carotid artery at the base of the skull.  I then selected a 9 mm proximal 7 mm distal 3 cm  long tapered Exact stent. This was deployed across the lesion encompassing it in its entirety. A 5 mm diameter by 2 cm length balloon was used to post dilate the stent. Only about a 10-15 % residual stenosis was present after  angioplasty. Completion angiogram showed normal intracranial filling without new defects. At this point I elected to terminate the procedure. The sheath was removed and StarClose closure device was deployed in the left femoral artery with excellent hemostatic result. The patient was taken to the recovery room in stable condition having tolerated the procedure well.  COMPLICATIONS: none  CONDITION: stable  Tamara Finley 12/31/2015 9:14 AM

## 2015-12-31 NOTE — Progress Notes (Signed)
Pharmacy Antibiotic Note  Tamara Finley is a 75 y.o. female admitted on 12/31/2015 with carotid stenosis. Pharmacy has been consulted for renal dosing of antibiotics.  Plan: Patient ordered cefuroxime 1.5 g iv q 12 hours x 2. Renal adjustment not necessary.  This patient's current antibiotics will be continued without adjustments.  Height: 5\' 4"  (162.6 cm) Weight: 175 lb (79.379 kg) IBW/kg (Calculated) : 54.7  Temp (24hrs), Avg:97.7 F (36.5 C), Min:97.7 F (36.5 C), Max:97.7 F (36.5 C)   Recent Labs Lab 12/31/15 0651  CREATININE 1.40*    Estimated Creatinine Clearance: 36.5 mL/min (by C-G formula based on Cr of 1.4).    No Known Allergies   Thank you for allowing pharmacy to be a part of this patient's care.  Ulice Dash D 12/31/2015 12:26 PM

## 2016-01-01 ENCOUNTER — Encounter: Payer: Self-pay | Admitting: Vascular Surgery

## 2016-01-01 LAB — BASIC METABOLIC PANEL
ANION GAP: 9 (ref 5–15)
BUN: 9 mg/dL (ref 6–20)
CALCIUM: 8.2 mg/dL — AB (ref 8.9–10.3)
CO2: 24 mmol/L (ref 22–32)
Chloride: 106 mmol/L (ref 101–111)
Creatinine, Ser: 1.03 mg/dL — ABNORMAL HIGH (ref 0.44–1.00)
GFR calc Af Amer: >60 mL/min (ref >60)
GFR calc non Af Amer: 53 mL/min — ABNORMAL LOW (ref >60)
GLUCOSE: 90 mg/dL (ref 65–99)
POTASSIUM: 3.5 mmol/L (ref 3.5–5.1)
SODIUM: 139 mmol/L (ref 135–145)

## 2016-01-01 LAB — CBC
HCT: 33.9 % — ABNORMAL LOW (ref 35.0–47.0)
HEMOGLOBIN: 11.2 g/dL — AB (ref 12.0–16.0)
MCH: 29 pg (ref 26.0–34.0)
MCHC: 32.9 g/dL (ref 32.0–36.0)
MCV: 88 fL (ref 80.0–100.0)
PLATELETS: 241 10*3/uL (ref 150–440)
RBC: 3.86 MIL/uL (ref 3.80–5.20)
RDW: 14.5 % (ref 11.5–14.5)
WBC: 5.5 10*3/uL (ref 3.6–11.0)

## 2016-01-01 MED ORDER — OXYCODONE-ACETAMINOPHEN 5-325 MG PO TABS: 1.0000 | ORAL_TABLET | ORAL | Status: DC | PRN

## 2016-01-01 NOTE — Progress Notes (Signed)
Pt sitting up in chair at bedside.  PT completed breakfast, IV's discontinued, cath intact.  Sites covered with clean dry dressing.  Bleeding controlled.  Pt getting dressed, and will be discharged via wheelchair from hospital. Given DC instructions and prescription for oxycodone. Pt verbalized understanding of instructions, denies questions.

## 2016-01-01 NOTE — Discharge Summary (Signed)
[facility]    Discharge Summary    Patient ID:  Tamara Finley MRN: [id number] DOB/AGE: 09-26-1940 75 y.o.  Admit date: 12/31/2015 Discharge date: 01/01/2016 Date of Surgery: 12/31/2015 Surgeon: Surgeon(s): Algernon Huxley, MD  Admission Diagnosis: LT carotid angio w possible stent     Carotid Artery Stenosis  [facility] Rep    cc:  M Godley  K Bartles  Discharge Diagnoses:  LT carotid angio w possible stent     Carotid Artery Stenosis  [facility]ott Rep    cc:  M Godley  K Bartles  Secondary Diagnoses: Past Medical History  Diagnosis Date  . Hypertension   . Diabetes mellitus without complication ([place])   . COPD (chronic obstructive pulmonary disease) ([place])   . Renal disorder   . Thyroid disease   . Hypercholesterolemia   . Abdominal aortic aneurysm (AAA) ([facility])     Procedure(s): Carotid PTA/Stent Intervention  Discharged Condition: good  HPI:  Brought in for angiogram for evaluation and possible treatment of carotid stenosis.    Hospital Course:  Tamara Finley is a 74 y.o. female is S/P left Procedure(s): Carotid PTA/Stent Intervention Extubated: N/A Physical exam: neuro exam normal, AF/VSS Post-op wounds C/D/I Pt. Ambulating, voiding and taking PO diet without difficulty. Pt pain controlled with PO pain meds. Labs as below Complications:none  Consults:     Significant Diagnostic Studies: CBC Lab Results  Component Value Date   WBC 5.5 01/01/2016   HGB 11.2* 01/01/2016   HCT 33.9* 01/01/2016   MCV 88.0 01/01/2016   PLT 241 01/01/2016    BMET    Component Value Date/Time   NA 139 01/01/2016 0513   K 3.5 01/01/2016 0513   CL 106 01/01/2016 0513   CO2 24 01/01/2016 0513   GLUCOSE 90 01/01/2016 0513   BUN 9 01/01/2016 0513   BUN 23* 10/18/2012 0815   CREATININE 1.03* 01/01/2016 0513   CREATININE 1.15 10/18/2012 0815   CALCIUM 8.2* 01/01/2016 0513   GFRNONAA 53* 01/01/2016 0513   GFRNONAA 48* 10/18/2012 0815   GFRAA >60  01/01/2016 0513   GFRAA 56* 10/18/2012 0815   COAG Lab Results  Component Value Date   INR 1.04 10/17/2015   INR 1.19 10/12/2015     Disposition:  Discharge to :home  Discharge Instructions    Discharge patient    Complete by:  As directed             Medication List    TAKE these medications        albuterol 108 (90 Base) MCG/ACT inhaler  Commonly known as:  PROVENTIL HFA;VENTOLIN HFA  Inhale 2 puffs into the lungs every 6 (six) hours as needed for wheezing or shortness of breath. Reported on 12/31/2015     amLODipine 5 MG tablet  Commonly known as:  NORVASC  Take 5 mg by mouth daily.     aspirin 81 MG chewable tablet  Chew 81 mg by mouth daily.     BREO ELLIPTA 100-25 MCG/INH Aepb  Generic drug:  fluticasone furoate-vilanterol  Inhale 1 puff into the lungs daily. Reported on 12/31/2015     CALCIUM 600+D 600-400 MG-UNIT tablet  Generic drug:  Calcium Carbonate-Vitamin D  Take 1 tablet by mouth 2 (two) times daily with a meal.     cholecalciferol 1000 units tablet  Commonly known as:  VITAMIN D  Take 2,000 Units by mouth daily. Reported on 12/31/2015     clopidogrel 75 MG tablet  Commonly known as:  PLAVIX  Take 75 mg by mouth daily.     doxepin 25 MG capsule  Commonly known as:  SINEQUAN  Take 25 mg by mouth at bedtime.     felodipine 10 MG 24 hr tablet  Commonly known as:  PLENDIL  Take 10 mg by mouth daily. Reported on 12/31/2015     fluticasone 50 MCG/ACT nasal spray  Commonly known as:  FLONASE  Place 1-2 sprays into both nostrils daily as needed for rhinitis.     hydrALAZINE 25 MG tablet  Commonly known as:  APRESOLINE  Take 25 mg by mouth 3 (three) times daily.     hydrochlorothiazide 25 MG tablet  Commonly known as:  HYDRODIURIL  Take 25 mg by mouth daily.     levothyroxine 88 MCG tablet  Commonly known as:  SYNTHROID, LEVOTHROID  Take 88 mcg by mouth daily before breakfast.     magnesium 30 MG tablet  Take 250 mg by mouth daily.      metoprolol succinate 50 MG 24 hr tablet  Commonly known as:  TOPROL-XL  Take 50 mg by mouth daily. Reported on 12/31/2015     omega-3 acid ethyl esters 1 g capsule  Commonly known as:  LOVAZA  Take 1 g by mouth daily.     oxyCODONE-acetaminophen 5-325 MG tablet  Commonly known as:  PERCOCET/ROXICET  Take 1-2 tablets by mouth every 4 (four) hours as needed for moderate pain.     pantoprazole 40 MG tablet  Commonly known as:  PROTONIX  Take 40 mg by mouth daily.     simvastatin 80 MG tablet  Commonly known as:  ZOCOR  Take 80 mg by mouth at bedtime. Reported on 12/31/2015     sodium chloride 0.65 % Soln nasal spray  Commonly known as:  OCEAN  Place 1-2 sprays into both nostrils as needed for congestion.     tiZANidine 4 MG tablet  Commonly known as:  ZANAFLEX  Take 4 mg by mouth every 6 (six) hours as needed for muscle spasms. Reported on 12/31/2015     traMADol 50 MG tablet  Commonly known as:  ULTRAM  Take 50 mg by mouth every 6 (six) hours as needed for moderate pain.       Verbal and written Discharge instructions given to the patient. Wound care per Discharge AVS     Follow-up Information    Follow up with Ramin Zoll, MD In 4 weeks.   Specialties:  Vascular Surgery, Radiology, Interventional Cardiology   Why:  with Carotid duplex   Contact information:   [place] [address] [phone]       Signed: Leotis Pain, MD  01/01/2016, 8:43 AM

## 2016-01-01 NOTE — Progress Notes (Signed)
Pt AAOx4 rested well this shift with c/o back /neck pain intermittently and was given medication as ordered. Pt foley d/c at 0615 and up to recliner . Pt steady on feet . Site to rt groin has Tegaderm in place with a small amount of old drainage noted. Further assessment via flow sheet

## 2016-02-11 DIAGNOSIS — Z79899 Other long term (current) drug therapy: Secondary | ICD-10-CM | POA: Insufficient documentation

## 2016-04-01 DIAGNOSIS — R002 Palpitations: Secondary | ICD-10-CM | POA: Insufficient documentation

## 2016-04-07 DIAGNOSIS — R0602 Shortness of breath: Secondary | ICD-10-CM | POA: Insufficient documentation

## 2016-05-27 DIAGNOSIS — D126 Benign neoplasm of colon, unspecified: Secondary | ICD-10-CM | POA: Insufficient documentation

## 2016-07-08 DIAGNOSIS — R739 Hyperglycemia, unspecified: Secondary | ICD-10-CM | POA: Insufficient documentation

## 2016-08-12 ENCOUNTER — Ambulatory Visit
Admission: RE | Admit: 2016-08-12 | Discharge: 2016-08-12 | Disposition: A | Discharge status: Home / Self Care | Payer: Medicare HMO | Source: Ambulatory Visit | Attending: Internal Medicine | Admitting: Internal Medicine

## 2016-08-12 ENCOUNTER — Other Ambulatory Visit: Payer: Self-pay | Admitting: Internal Medicine

## 2016-08-12 DIAGNOSIS — Z1231 Encounter for screening mammogram for malignant neoplasm of breast: Secondary | ICD-10-CM

## 2016-08-12 DIAGNOSIS — R921 Mammographic calcification found on diagnostic imaging of breast: Secondary | ICD-10-CM

## 2016-08-12 DIAGNOSIS — N6322 Unspecified lump in the left breast, upper inner quadrant: Secondary | ICD-10-CM | POA: Insufficient documentation

## 2016-08-15 DIAGNOSIS — N6320 Unspecified lump in the left breast, unspecified quadrant: Secondary | ICD-10-CM | POA: Insufficient documentation

## 2016-08-18 ENCOUNTER — Other Ambulatory Visit: Payer: Self-pay | Admitting: Internal Medicine

## 2016-08-18 DIAGNOSIS — N6320 Unspecified lump in the left breast, unspecified quadrant: Secondary | ICD-10-CM

## 2016-09-03 ENCOUNTER — Ambulatory Visit
Admission: RE | Admit: 2016-09-03 | Discharge: 2016-09-03 | Disposition: A | Discharge status: Home / Self Care | Payer: Medicare HMO | Source: Ambulatory Visit | Attending: Internal Medicine | Admitting: Internal Medicine

## 2016-09-03 DIAGNOSIS — N6322 Unspecified lump in the left breast, upper inner quadrant: Secondary | ICD-10-CM | POA: Diagnosis not present

## 2016-09-03 DIAGNOSIS — N6320 Unspecified lump in the left breast, unspecified quadrant: Secondary | ICD-10-CM

## 2016-09-03 HISTORY — PX: BREAST BIOPSY: SHX20

## 2016-09-04 LAB — SURGICAL PATHOLOGY

## 2016-09-05 ENCOUNTER — Other Ambulatory Visit: Payer: Self-pay | Admitting: Internal Medicine

## 2016-09-05 DIAGNOSIS — R928 Other abnormal and inconclusive findings on diagnostic imaging of breast: Secondary | ICD-10-CM

## 2016-09-05 DIAGNOSIS — N6320 Unspecified lump in the left breast, unspecified quadrant: Secondary | ICD-10-CM

## 2016-09-25 ENCOUNTER — Other Ambulatory Visit: Payer: Self-pay | Admitting: Internal Medicine

## 2016-09-25 ENCOUNTER — Ambulatory Visit
Admission: RE | Admit: 2016-09-25 | Discharge: 2016-09-25 | Disposition: A | Discharge status: Home / Self Care | Payer: Medicare HMO | Source: Ambulatory Visit | Attending: Internal Medicine | Admitting: Internal Medicine

## 2016-09-25 DIAGNOSIS — R928 Other abnormal and inconclusive findings on diagnostic imaging of breast: Secondary | ICD-10-CM

## 2016-09-25 DIAGNOSIS — C50212 Malignant neoplasm of upper-inner quadrant of left female breast: Secondary | ICD-10-CM | POA: Diagnosis not present

## 2016-09-25 DIAGNOSIS — N6320 Unspecified lump in the left breast, unspecified quadrant: Secondary | ICD-10-CM

## 2016-09-25 HISTORY — PX: BREAST BIOPSY: SHX20

## 2016-10-03 ENCOUNTER — Telehealth: Payer: Self-pay

## 2016-10-03 NOTE — Telephone Encounter (Signed)
Patient notified of her appointment with Dr.Cooper 10/10/16 @ 2:15 pm. Directions provided to [facility].

## 2016-10-03 NOTE — Telephone Encounter (Signed)
Left message on home phone for patient to call office at this time. Unable to leave message on cell phone, mailbox not set up at this time.  Dr.Cooper-10-10-16 @ 2:15 pm

## 2016-10-08 ENCOUNTER — Other Ambulatory Visit: Payer: Self-pay

## 2016-10-08 LAB — SURGICAL PATHOLOGY

## 2016-10-09 ENCOUNTER — Other Ambulatory Visit: Payer: Self-pay

## 2016-10-09 DIAGNOSIS — G43909 Migraine, unspecified, not intractable, without status migrainosus: Secondary | ICD-10-CM | POA: Insufficient documentation

## 2016-10-09 DIAGNOSIS — E785 Hyperlipidemia, unspecified: Secondary | ICD-10-CM | POA: Insufficient documentation

## 2016-10-09 DIAGNOSIS — M1990 Unspecified osteoarthritis, unspecified site: Secondary | ICD-10-CM | POA: Insufficient documentation

## 2016-10-09 NOTE — Progress Notes (Signed)
  Oncology Nurse Navigator Documentation  Navigator Location: [facility] (10/09/16 1600)   )Navigator Encounter Type: Introductory phone call (10/09/16 1600)   Abnormal Finding Date: 08/12/16 (10/09/16 1600) Confirmed Diagnosis Date: 10/04/16 (10/09/16 1600)                   Barriers/Navigation Needs: Education (10/09/16 1600) Education: Coping with Diagnosis/ Prognosis;Newly Diagnosed Cancer Education (10/09/16 1600)              Acuity: Level 2 (10/09/16 1600)   Acuity Level 2: Initial guidance, education and coordination as needed;Educational needs (10/09/16 1600)     Time Spent with Patient: 30 (10/09/16 1600)   Introduced patient to [employer]. She has Surgical consult with Dr. Burt Knack 10/10/16 at 2:15.  Tanya Nones took Breast Cancer Treatment Handbook/folder with hospital services to office to be given to patient.  Will follow-up next week. History /risk factor info for case conference obtained.

## 2016-10-10 ENCOUNTER — Telehealth: Payer: Self-pay

## 2016-10-10 ENCOUNTER — Ambulatory Visit (INDEPENDENT_AMBULATORY_CARE_PROVIDER_SITE_OTHER): Payer: Medicare HMO | Admitting: Surgery

## 2016-10-10 ENCOUNTER — Encounter: Payer: Self-pay | Admitting: Surgery

## 2016-10-10 VITALS — BP 184/92 | HR 79 | Temp 97.7°F | Ht 64.0 in | Wt 168.8 lb

## 2016-10-10 DIAGNOSIS — C50212 Malignant neoplasm of upper-inner quadrant of left female breast: Secondary | ICD-10-CM | POA: Diagnosis not present

## 2016-10-10 NOTE — Telephone Encounter (Signed)
I have faxed a referral to [facility] Phone #: [phone] Fax #: [phone] & received a confirmation.   I have put in an internal referral to the cancer center as well.   I will follow up within 3-5 days to make sure the appointments have been scheduled.

## 2016-10-10 NOTE — Patient Instructions (Signed)
We will send your referral to [facility] for your 2nd opinion. We will also set-up an appointment for you to be seen by the Oncologist at the [facility]. We will call as soon as we have your appointment information available.  Information on both surgeries are below. You have also been provided Breast Cancer resources in your bag today. You may call Tanya Nones (Nurse Navigator) [phone] with any questions that you have in regards to your cancer process.  Keep taking your Plavix and Aspirin until you are told otherwise.  Total or Modified Radical Mastectomy A total mastectomy and a modified radical mastectomy are types of surgery for breast cancer. If you are having a total mastectomy (simple mastectomy), your entire breast will be removed. If you are having a modified radical mastectomy, your breast and nipple will be removed along with the lymph nodes under your arm. You may also have some of the lining over the muscle tissues under your breast removed. Let your health care provider know about:  Any allergies you have.  All medicines you are taking, including vitamins, herbs, eye drops, creams, and over-the-counter medicines.  Previous problems you or members of your family have had with the use of anesthetics.  Any blood disorders you have.  Any surgeries you have had.  Any medical conditions you have. What are the risks? Generally, this is a safe procedure. However, problems may occur, including:  Pain.  Infection.  Bleeding.  Scar tissue.  Chest numbness on the side of the surgery.  Fluid buildup under the skin flaps where your breast was removed (seroma).  Sensation of throbbing or tingling.  Stress or sadness from losing your breast. If you have the lymph nodes under your arm removed, you may have arm swelling, weakness, or numbness on the same side of your body as your surgery. What happens before the procedure?  Ask your health care provider about:  Changing  or stopping your regular medicines. This is especially important if you are taking diabetes medicines or blood thinners.  Taking medicines such as aspirin and ibuprofen. These medicines can thin your blood. Do not take these medicines before your procedure if your health care provider instructs you not to.  Follow your health care provider's instructions about eating or drinking restrictions.  You may be checked for extra fluid around your lymph nodes (lymphedema).  Plan to have someone take you home after the procedure. What happens during the procedure?  An IV tube will be inserted into one of your veins.  You will be given a medicine that makes you fall asleep (general anesthetic).  Your breast will be cleaned with a germ-killing solution (antiseptic).  A wide incision will be made around your nipple. The skin and nipple inside the incision will be removed along with all breast tissue.  If you are having a modified radical mastectomy:  The lining over your chest muscles will be removed.  The incision may be extended to reach the lymph nodes under your arm, or a second incision may be made.  The lymph nodes will be removed.  You may have a drainage tube inserted into your incision to collect fluid that builds up after surgery. This tube is connected to a suction bulb.  Your incision or incisions will be closed with stitches (sutures).  A bandage (dressing) will be placed over your breast and under your arm. The procedure may vary among health care providers and hospitals. What happens after the procedure?  You will  be moved to a recovery area.  Your blood pressure, heart rate, breathing rate, and blood oxygen level will be monitored often until the medicines you were given have worn off.  You will be given pain medicine as needed.  After a while, you will be taken to a hospital room.  You will be encouraged to get up and walk as soon as you can.  Your IV tube can be  removed when you are able to eat and drink.  Your drain may be removed before you go home from the hospital, or you may be sent home with your drain and suction bulb. This information is not intended to replace advice given to you by your health care provider. Make sure you discuss any questions you have with your health care provider. Document Released: 05/13/2001 Document Revised: 04/24/2016 Document Reviewed: 05/03/2014 Elsevier Interactive Patient Education  2017 [place] A lumpectomy, sometimes called a partial mastectomy, is surgery to remove a cancerous tumor or mass (the lump) from a breast. It is a form of "breast conserving" or "breast preservation" surgery. This means that the cancerous tissue is removed but the breast remains intact. During a lumpectomy, the portion of the breast that contains the tumor is removed. Some normal tissue around the lump may be taken out to make sure that all of the tumor has been removed. Lymph nodes under your arm may also be removed and tested to find out if the cancer has spread. Tell a health care provider about:  Any allergies you have.  All medicines you are taking, including vitamins, herbs, eye drops, creams, and over-the-counter medicines.  Any problems you or family members have had with anesthetic medicines.  Any blood disorders you have.  Any surgeries you have had.  Any medical conditions you have.  Whether you are pregnant or may be pregnant. What are the risks? Generally, this is a safe procedure. However, problems may occur, including:  Bleeding.  Infection.  Pain, swelling, weakness, or numbness in the arm on the side of your surgery.  Temporary swelling.  Change in the shape of the breast, particularly if a large portion is removed.  Scar tissue that forms at the surgical site and feels hard to the touch. What happens before the procedure? Staying hydrated  Follow instructions from your health  care provider about hydration, which may include:  Up to 2 hours before the procedure - you may continue to drink clear liquids, such as water, clear fruit juice, black coffee, and plain tea. Eating and drinking restrictions  Follow instructions from your health care provider about eating and drinking, which may include:  8 hours before the procedure - stop eating heavy meals or foods such as meat, fried foods, or fatty foods.  6 hours before the procedure - stop eating light meals or foods, such as toast or cereal.  6 hours before the procedure - stop drinking milk or drinks that contain milk.  2 hours before the procedure - stop drinking clear liquids. Medicines  Ask your health care provider about:  Changing or stopping your regular medicines. This is especially important if you are taking diabetes medicines or blood thinners.  Taking medicines such as aspirin and ibuprofen. These medicines can thin your blood. Do not take these medicines before your procedure if your health care provider instructs you not to.  You may be given antibiotic medicine to help prevent infection. General instructions  Ask your health care provider how your  surgical site will be marked or identified.  You may be screened for extra fluid around the lymph nodes (lymphedema).  Plan to have someone take you home from the hospital or clinic.  On the day of surgery, your health care provider will use a mammogram or ultrasound to locate and mark the tumor in your breast. These markings on your breast will show where the incision will be made. What happens during the procedure?  To lower your risk of infection:  Your health care team will wash or sanitize their hands.  Your skin will be washed with soap.  An IV tube will be put into one of your veins.  You will be given one or more of the following:  A medicine to help you relax (sedative).  A medicine to numb the area (local anesthetic).  A  medicine to make you fall asleep (general anesthetic).  Your health care provider will use a kind of electric scalpel that uses heat to minimize bleeding (electrocautery knife). A curved incision that follows the natural curve of your breast will be made. This type of incision will allow for minimal scarring and better healing.  The tumor will be removed along with some of the surrounding tissue. This will be sent to the lab for testing. Your health care provider may also remove lymph nodes at this time if needed.  A small drain tube may be inserted into your breast area or armpit to collect fluid that may build up after surgery. This tube will be connected to a suction bulb on the outside of your body to remove the fluid.  The incision will be closed with stitches (sutures).  A bandage (dressing) may be placed over the incision. The procedure may vary among health care providers and hospitals. What happens after the procedure?  Your blood pressure, heart rate, breathing rate, and blood oxygen level will be monitored until the medicines you were given have worn off.  You will be given medicine for pain.  You may have a drain tube in place for 2-3 days to prevent a collection of blood (hematoma) from developing in the breast. You will be given instructions about caring for the drain before you go home.  A pressure bandage may be applied for 1-2 days to prevent bleeding or swelling. Your pressure bandage may look like a thick piece of fabric or an elastic wrap. Ask your health care provider how to care for your bandage at home.  You may be given a tight sleeve to wear over your arm on the side of your surgery. You should wear this sleeve as told by your health care provider.  Do not drive for 24 hours if you were given a sedative. This information is not intended to replace advice given to you by your health care provider. Make sure you discuss any questions you have with your health care  provider. Document Released: 09/29/2006 Document Revised: 05/01/2016 Document Reviewed: 04/30/2016 Elsevier Interactive Patient Education  2017 [employer].

## 2016-10-10 NOTE — Progress Notes (Signed)
Surgical Consultation  10/10/2016  Tamara Finley is an 76 y.o. female.   CC: Left breast cancer  HPI: This patient with a mammographic abnormality in the left breast 1 underwent a stereotactic core biopsy had normal breast cancer Center and that showed left breast cancer. Infiltrating ductal carcinoma. ER/PR is still pending.  Patient does regular self exams are nota new this was a mammographic finding.  The patient has multiple medical problems see below she takes aspirin and Plavix.  GYN History:   Family Breast Cancer History: There is a family history of ovarian cancer. This would be in her sister. No history of breast cancer in the family  Past Medical History:  Diagnosis Date  . Abdominal aortic aneurysm (AAA) ([place])   . Anxiety and depression   . Breast calcification, left   . Carotid stenosis   . Chronic insomnia   . COPD (chronic obstructive pulmonary disease) ([place])   . CRI (chronic renal insufficiency), stage 3 (moderate)   . Diabetes mellitus without complication ([place])   . GERD (gastroesophageal reflux disease)   . Heart palpitations   . History of AAA (abdominal aortic aneurysm) repair   . History of TIA (transient ischemic attack)   . Hypercholesterolemia   . Hypertension   . Hypothyroidism   . Migraine headache   . Osteoarthritis   . Osteoporosis   . Peripheral arterial disease ([place])   . Renal disorder   . SOB (shortness of breath)   . Stenosis of left subclavian artery ([facility])   . Thyroid disease   . TIA (transient ischemic attack)   . 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 ABDOMINAL AORTIC ANEURYSM REPAIR    . BREAST BIOPSY Left 09/03/2016   benign  . BREAST BIOPSY Left 09/25/2016   path pending  . BREAST EXCISIONAL BIOPSY Left 2010  . BREAST EXCISIONAL BIOPSY Left 07/01/2006  . CARDIAC SURGERY    . PERIPHERAL VASCULAR CATHETERIZATION N/A 12/05/2015   Procedure: Renal  Angiography;  Surgeon: Katha Cabal, MD;  Location: [place] [facility];  Service: Cardiovascular;  Laterality: N/A;  . PERIPHERAL VASCULAR CATHETERIZATION Left 12/31/2015   Procedure: Carotid PTA/Stent Intervention;  Surgeon: Algernon Huxley, MD;  Location: [place] [facility];  Service: Cardiovascular;  Laterality: Left;    Family History  Problem Relation Age of Onset  . CVA Mother   . Hypertension Mother   . Stroke Mother   . Epilepsy Mother   . Dementia Father   . Stroke Father   . Coronary artery disease Father   . Hypertension Father   . Ovarian cancer Sister   . Cancer Brother   . Diabetes Brother     Social History:  reports that she quit smoking about 3 years ago. Her smoking use included Cigarettes. She has a 100.00 pack-year smoking history. She has never used smokeless tobacco. She reports that she does not drink alcohol or use drugs.  Allergies:  Allergies  Allergen Reactions  . Alprazolam Rash    "bad dreams"    Medications reviewed.   Review of Systems:   Review of Systems  Constitutional: Negative.   HENT: Negative.   Eyes: Negative.   Respiratory: Negative.   Cardiovascular: Negative.   Gastrointestinal: Negative.   Genitourinary: Negative.   Musculoskeletal: Negative.   Skin: Negative.   Neurological: Negative.   Endo/Heme/Allergies: Negative.   Psychiatric/Behavioral: Negative.  Physical Exam:  There were no vitals taken for this visit.  Physical Exam  Constitutional: She is oriented to person, place, and time and well-developed, well-nourished, and in no distress. No distress.  Neck: Normal range of motion.  Pulmonary/Chest: Right breast exhibits no inverted nipple, no mass, no nipple discharge and no skin change. Left breast exhibits no inverted nipple, no mass, no nipple discharge and no skin change.    Abdominal: Soft. She exhibits no distension. There is no tenderness.  Musculoskeletal: Normal range of motion. She exhibits no  edema.  Lymphadenopathy:    She has no cervical adenopathy.  Neurological: She is alert and oriented to person, place, and time.  Skin: Skin is warm and dry. She is not diaphoretic. No erythema.  Psychiatric: Mood and affect normal.  Vitals reviewed.   Breast Exam: Bilateral breast exam is performed multiple scars are noted.  There is a scar in the medial portion of the left breast without associated ecchymosis erythema or mass. No axillary adenopathy. On the right breast there is no mass or axillary adenopathy noted. The diagram delineates the upper inner quadrant approximately 10:00 as the site of the core needle biopsy.    No results found for this or any previous visit (from the past 48 hour(s)). No results found.  Pathology: Intraductal carcinoma invasive ER/PR currently pending this was obtained via core needle biopsy of the left breast Assessment/Plan:  Left breast cancer diagnosed via core needle biopsy. Patient will require surgical excision. I will discuss with the patient the options of observation versus needle localized partial mastectomy with sentinel node biopsy as well as total mastectomy with sentinel node biopsy. She has not yet seen an oncologist and I will arrange for that to be performed. She will need to stop her aspirin and Plavix 10 days prior to surgery and anticipated inpatient care overnight. The risks of bleeding infection recurrence cosmetic deformity the need for additional therapy were all reviewed with she and her family they understood and agreed. Long discussion was held with the family discussing these options. Patient is given information and has requested a second opinion and wants to go to [facility] where most of her medical care is handled as well. We'll arrange for her to see an oncologist here at [facility] as well as [facility]. With [place] and give [place] her final decision.  Florene Glen, MD, FACS

## 2016-10-13 ENCOUNTER — Encounter: Payer: Self-pay | Admitting: Diagnostic Radiology

## 2016-10-15 NOTE — Telephone Encounter (Signed)
Re-faxed referral to [facility] Phone #: [phone] Fax #: [phone]  Appointment has been made with Dr. Grayland Ormond for 10/21/16 at 10:00 [facility])  I will follow up on the referral to [facility]

## 2016-10-20 DIAGNOSIS — C801 Malignant (primary) neoplasm, unspecified: Secondary | ICD-10-CM

## 2016-10-20 DIAGNOSIS — C50212 Malignant neoplasm of upper-inner quadrant of left female breast: Secondary | ICD-10-CM | POA: Insufficient documentation

## 2016-10-20 HISTORY — DX: Malignant (primary) neoplasm, unspecified: C80.1

## 2016-10-20 NOTE — Telephone Encounter (Signed)
An appointment with [facility] has been scheduled for 10/29/2016.

## 2016-10-20 NOTE — Progress Notes (Signed)
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  INTERVAL HISTORY: Tamara Finley is a 76 year old female who was found to have an abnormality in her left breast on routine screening mammogram.  Subsequent ultrasound and biopsy revealed he above stated breast cancer. She currently is anxious, but otherwise feels well. She has no neurologic complaints.  She denies any recent fevers or illnesses. She has a good appetite and denies weight loss.  She has no chest pain or shortness or breath. She denies any nausea, vomiting, constipation, or diarrhea.  She has no urinary complaints.  Tamara Finley feels at her baseline and offers no specific complaints today.  REVIEW OF SYSTEMS:   Review of Systems  Constitutional: Negative.  Negative for fever, malaise/fatigue and weight loss.  Respiratory: Negative.  Negative for cough and shortness of breath.   Cardiovascular: Negative.  Negative for chest pain and leg swelling.  Gastrointestinal: Negative.  Negative for abdominal pain.  Genitourinary: Negative.   Musculoskeletal: Negative.   Neurological: Negative.  Negative for weakness.  Psychiatric/Behavioral: Negative.  The Tamara Finley is not nervous/anxious.     As per HPI. Otherwise, a complete review of systems is negative.  PAST MEDICAL HISTORY: Past Medical History:  Diagnosis Date  . Abdominal aortic aneurysm (AAA) ([place]) 2000's  . Anxiety and depression   . Breast calcification, left   . Carotid stenosis   . Chronic insomnia   . COPD (chronic obstructive pulmonary disease) ([place])   . CRI (chronic renal insufficiency), stage 3 (moderate)   . Diabetes mellitus without complication ([place])   . GERD  (gastroesophageal reflux disease)   . Heart palpitations   . History of AAA (abdominal aortic aneurysm) repair   . History of TIA (transient ischemic attack)   . Hypercholesterolemia   . Hypertension   . Hypothyroidism   . Migraine headache   . Osteoarthritis   . Osteoporosis   . Peripheral arterial disease ([place])   . Renal disorder   . SOB (shortness of breath)   . Stenosis of left subclavian artery (HCC)   . Thyroid disease   . TIA (transient ischemic attack)   . Type 2 diabetes mellitus with vascular disease ([place])     PAST SURGICAL HISTORY: Past Surgical History:  Procedure Laterality Date  . ABDOMINAL AORTIC ANEURYSM REPAIR    . BREAST BIOPSY Left 09/03/2016   benign  . BREAST BIOPSY Left 09/25/2016   path pending  . BREAST EXCISIONAL BIOPSY Left 2010  . BREAST EXCISIONAL BIOPSY Left 07/01/2006  . CARDIAC SURGERY    . PERIPHERAL VASCULAR CATHETERIZATION N/A 12/05/2015   Procedure: Renal Angiography;  Surgeon: Katha Cabal, MD;  Location: [facility];  Service: Cardiovascular;  Laterality: N/A;  . PERIPHERAL VASCULAR CATHETERIZATION Left 12/31/2015   Procedure: Carotid PTA/Stent Intervention;  Surgeon: Algernon Huxley, MD;  Location: [facility];  Service: Cardiovascular;  Laterality: Left;    FAMILY HISTORY: Family History  Problem Relation Age of Onset  . CVA Mother   . Hypertension Mother   . Stroke Mother   . Epilepsy Mother   . Dementia Father   . Stroke Father   . Coronary artery disease Father   . Hypertension Father   . Ovarian cancer  Sister   . Cancer Brother   . Diabetes Brother     ADVANCED DIRECTIVES (Y/N):  N  HEALTH MAINTENANCE: Social History  Substance Use Topics  . Smoking status: Former Smoker    Packs/day: 2.00    Years: 50.00    Types: Cigarettes    Quit date: 06/01/2013  . Smokeless tobacco: Never Used  . Alcohol use No     Colonoscopy:  PAP:  Bone density:  Lipid panel:  Allergies  Allergen Reactions  .  Alprazolam Rash    "bad dreams"    Current Outpatient Prescriptions  Medication Sig Dispense Refill  . albuterol (PROVENTIL HFA;VENTOLIN HFA) 108 (90 BASE) MCG/ACT inhaler Inhale 2 puffs into the lungs every 6 (six) hours as needed for wheezing or shortness of breath. Reported on 12/31/2015    . amLODipine (NORVASC) 5 MG tablet Take 5 mg by mouth daily.    . aspirin 81 MG chewable tablet Chew 81 mg by mouth daily.    . atorvastatin (LIPITOR) 40 MG tablet Take by mouth.    . Calcium Carbonate-Vitamin D (CALCIUM 600+D) 600-400 MG-UNIT tablet Take 1 tablet by mouth 2 (two) times daily with a meal.    . cholecalciferol (VITAMIN D) 1000 units tablet Take 2,000 Units by mouth daily. Reported on 12/31/2015    . clopidogrel (PLAVIX) 75 MG tablet Take 75 mg by mouth daily.    . felodipine (PLENDIL) 10 MG 24 hr tablet Take 10 mg by mouth daily. Reported on 12/31/2015    . fluticasone (FLONASE) 50 MCG/ACT nasal spray Place 1-2 sprays into both nostrils daily as needed for rhinitis.    . furosemide (LASIX) 40 MG tablet Take 1 tablet by mouth 1 day or 1 dose.    . gabapentin (NEURONTIN) 300 MG capsule Take 1 capsule by mouth 1 day or 1 dose.    . hydrALAZINE (APRESOLINE) 25 MG tablet Take 25 mg by mouth 3 (three) times daily.    . hydrochlorothiazide (HYDRODIURIL) 25 MG tablet Take 25 mg by mouth daily.    . levothyroxine (SYNTHROID, LEVOTHROID) 88 MCG tablet Take 88 mcg by mouth daily before breakfast.    . magnesium 30 MG tablet Take 250 mg by mouth daily.    . metoprolol succinate (TOPROL-XL) 50 MG 24 hr tablet Take 50 mg by mouth daily. Reported on 12/31/2015    . omega-3 acid ethyl esters (LOVAZA) 1 g capsule Take 1 g by mouth daily.    . pantoprazole (PROTONIX) 40 MG tablet Take 40 mg by mouth daily.    . simvastatin (ZOCOR) 80 MG tablet Take 80 mg by mouth at bedtime. Reported on 12/31/2015    . sodium chloride (OCEAN) 0.65 % SOLN nasal spray Place 1-2 sprays into both nostrils as needed for congestion.      . traMADol (ULTRAM) 50 MG tablet Take 50 mg by mouth every 6 (six) hours as needed for moderate pain.     . vitamin B-12 (CYANOCOBALAMIN) 1000 MCG tablet Take 1 tablet by mouth 1 day or 1 dose.     No current facility-administered medications for this visit.     OBJECTIVE: Vitals:   10/21/16 1303  BP: (!) 178/91  Pulse: 83     Body mass index is 28.76 kg/m.    ECOG FS:0 - Asymptomatic  General: Well-developed, well-nourished, no acute distress. Eyes: Pink conjunctiva, anicteric sclera. HEENT: Normocephalic, moist mucous membranes, clear oropharnyx.  Breasts: Tamara Finley refused breast exam today. Lungs: Clear to auscultation bilaterally. Heart:   Regular rate and rhythm. No rubs, murmurs, or gallops. Abdomen: Soft, nontender, nondistended. No organomegaly noted, normoactive bowel sounds. Musculoskeletal: No edema, cyanosis, or clubbing. Neuro: Alert, answering all questions appropriately. Cranial nerves grossly intact. Skin: No rashes or petechiae noted. Psych: Normal affect. Lymphatics: No cervical, calvicular, axillary or inguinal LAD.   LAB RESULTS:  Lab Results  Component Value Date   NA 139 01/01/2016   K 3.5 01/01/2016   CL 106 01/01/2016   CO2 24 01/01/2016   GLUCOSE 90 01/01/2016   BUN 9 01/01/2016   CREATININE 1.03 (H) 01/01/2016   CALCIUM 8.2 (L) 01/01/2016   PROT 7.3 10/17/2015   ALBUMIN 3.7 10/17/2015   AST 28 10/17/2015   ALT 17 10/17/2015   ALKPHOS 64 10/17/2015   BILITOT 0.7 10/17/2015   GFRNONAA 53 (L) 01/01/2016   GFRAA >60 01/01/2016    Lab Results  Component Value Date   WBC 5.5 01/01/2016   NEUTROABS 7.2 (H) 10/17/2015   HGB 11.2 (L) 01/01/2016   HCT 33.9 (L) 01/01/2016   MCV 88.0 01/01/2016   PLT 241 01/01/2016     STUDIES: No results found.  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  PLAN:    1. Clinical stage IA ER/PR positive, HER-2 negative  invasive carcinoma of the upper inner quadrant of the left breast: Given the stage of Tamara Finley's cancer, have recommended she proceed with surgery as initial therapy.  Given the size of her mass it is unlikely she will require adjuvant chemotherapy, but will await her final pathology results.  If she elects for a lumpectomy, she will require adjuvant XRT.  Finally given the ER/PR status of her tumor, she will require an aromatase inhibtor for a total of 5 yeas following the conclusion of her treatments.  Return to clinic 1-2 weeks after her surgery for further evaluation and addition treatment planning.  Approximately 45 minutes was spent in discussion of which greater then 50% was consultation.   Tamara Finley expressed understanding and was in agreement with this plan. She also understands that She can call clinic at any time with any questions, concerns, or complaints.   Cancer Staging Primary cancer of upper inner quadrant of left female breast [facility]) Staging form: Breast, AJCC 8th Edition - Clinical stage from 10/20/2016: Stage IA (cT1a, cN0, cM0, G2, ER: Positive, PR: Positive, HER2: Negative) - Signed by Lloyd Huger, MD on 10/20/2016   Lloyd Huger, MD   10/25/2016 11:22 AM

## 2016-10-21 ENCOUNTER — Inpatient Hospital Stay: Payer: Medicare HMO | Attending: Oncology | Admitting: Oncology

## 2016-10-21 ENCOUNTER — Encounter: Payer: Self-pay | Admitting: Oncology

## 2016-10-21 DIAGNOSIS — E7800 Pure hypercholesterolemia, unspecified: Secondary | ICD-10-CM | POA: Diagnosis not present

## 2016-10-21 DIAGNOSIS — F419 Anxiety disorder, unspecified: Secondary | ICD-10-CM | POA: Insufficient documentation

## 2016-10-21 DIAGNOSIS — E1122 Type 2 diabetes mellitus with diabetic chronic kidney disease: Secondary | ICD-10-CM | POA: Insufficient documentation

## 2016-10-21 DIAGNOSIS — Z79899 Other long term (current) drug therapy: Secondary | ICD-10-CM | POA: Diagnosis not present

## 2016-10-21 DIAGNOSIS — Z7982 Long term (current) use of aspirin: Secondary | ICD-10-CM | POA: Diagnosis not present

## 2016-10-21 DIAGNOSIS — Z87891 Personal history of nicotine dependence: Secondary | ICD-10-CM | POA: Diagnosis not present

## 2016-10-21 DIAGNOSIS — M1990 Unspecified osteoarthritis, unspecified site: Secondary | ICD-10-CM | POA: Diagnosis not present

## 2016-10-21 DIAGNOSIS — J449 Chronic obstructive pulmonary disease, unspecified: Secondary | ICD-10-CM

## 2016-10-21 DIAGNOSIS — E039 Hypothyroidism, unspecified: Secondary | ICD-10-CM

## 2016-10-21 DIAGNOSIS — K219 Gastro-esophageal reflux disease without esophagitis: Secondary | ICD-10-CM

## 2016-10-21 DIAGNOSIS — N183 Chronic kidney disease, stage 3 (moderate): Secondary | ICD-10-CM | POA: Insufficient documentation

## 2016-10-21 DIAGNOSIS — I129 Hypertensive chronic kidney disease with stage 1 through stage 4 chronic kidney disease, or unspecified chronic kidney disease: Secondary | ICD-10-CM

## 2016-10-21 DIAGNOSIS — C50212 Malignant neoplasm of upper-inner quadrant of left female breast: Secondary | ICD-10-CM | POA: Insufficient documentation

## 2016-10-21 DIAGNOSIS — Z170 Estrogen receptor positive status [ER+]: Secondary | ICD-10-CM | POA: Diagnosis not present

## 2016-10-21 DIAGNOSIS — Z8679 Personal history of other diseases of the circulatory system: Secondary | ICD-10-CM | POA: Diagnosis not present

## 2016-10-21 DIAGNOSIS — Z8041 Family history of malignant neoplasm of ovary: Secondary | ICD-10-CM | POA: Diagnosis not present

## 2016-10-21 DIAGNOSIS — E1151 Type 2 diabetes mellitus with diabetic peripheral angiopathy without gangrene: Secondary | ICD-10-CM | POA: Diagnosis not present

## 2016-10-21 DIAGNOSIS — Z8673 Personal history of transient ischemic attack (TIA), and cerebral infarction without residual deficits: Secondary | ICD-10-CM | POA: Insufficient documentation

## 2016-10-21 NOTE — Progress Notes (Signed)
  Oncology Nurse Navigator Documentation  Navigator Location: [facility]Med Onc (10/21/16 1600) Referral date to RadOnc/MedOnc: 10/21/16 (10/21/16 1600) )Navigator Encounter Type: Initial MedOnc (10/21/16 1600)                     Patient Visit Type: MedOnc;Initial (10/21/16 1600) Treatment Phase: Pre-Tx/Tx Discussion (10/21/16 1600) Barriers/Navigation Needs: Education;Coordination of Care (10/21/16 1600) Education: Understanding Cancer/ Treatment Options;Coping with Diagnosis/ Prognosis;Newly Diagnosed Cancer Education (10/21/16 1600)              Acuity: Level 2 (10/21/16 1600)   Acuity Level 2: Assistance expediting appointments;Educational needs;Initial guidance, education and coordination as needed (10/21/16 1600)     Time Spent with Patient: 60 (10/21/16 1600)  Supported patient , family at initial Med/Onc visit.   Patient has appointment for second opinion at [facility] on 10/29/16.  She will contact navigator to notify of plans, and if surgery here, will notify of date, so follow-up Med/Onc visit can be scheduled.

## 2016-11-07 DIAGNOSIS — R911 Solitary pulmonary nodule: Secondary | ICD-10-CM | POA: Insufficient documentation

## 2016-11-25 DIAGNOSIS — R590 Localized enlarged lymph nodes: Secondary | ICD-10-CM | POA: Insufficient documentation

## 2016-12-16 HISTORY — PX: THORACOSCOPY: SUR1347

## 2017-01-19 ENCOUNTER — Encounter: Payer: Self-pay | Admitting: *Deleted

## 2017-01-20 ENCOUNTER — Encounter: Admission: RE | Payer: Self-pay | Source: Ambulatory Visit

## 2017-01-20 ENCOUNTER — Ambulatory Visit: Admission: RE | Admit: 2017-01-20 | Payer: Medicare HMO | Source: Ambulatory Visit | Admitting: Gastroenterology

## 2017-01-20 ENCOUNTER — Encounter: Payer: Self-pay | Admitting: Certified Registered Nurse Anesthetist

## 2017-01-20 HISTORY — DX: Depression, unspecified: F32.A

## 2017-01-20 HISTORY — DX: Anxiety disorder, unspecified: F41.9

## 2017-01-20 HISTORY — DX: Major depressive disorder, single episode, unspecified: F32.9

## 2017-01-20 SURGERY — COLONOSCOPY WITH PROPOFOL
Anesthesia: General

## 2017-04-09 ENCOUNTER — Other Ambulatory Visit (INDEPENDENT_AMBULATORY_CARE_PROVIDER_SITE_OTHER): Payer: Self-pay | Admitting: Vascular Surgery

## 2017-04-09 DIAGNOSIS — I701 Atherosclerosis of renal artery: Secondary | ICD-10-CM

## 2017-04-10 ENCOUNTER — Encounter (INDEPENDENT_AMBULATORY_CARE_PROVIDER_SITE_OTHER): Payer: Self-pay | Admitting: Vascular Surgery

## 2017-04-10 ENCOUNTER — Ambulatory Visit (INDEPENDENT_AMBULATORY_CARE_PROVIDER_SITE_OTHER): Payer: Medicare HMO

## 2017-04-10 ENCOUNTER — Ambulatory Visit (INDEPENDENT_AMBULATORY_CARE_PROVIDER_SITE_OTHER): Payer: Medicare HMO | Admitting: Vascular Surgery

## 2017-04-10 VITALS — BP 147/86 | HR 87 | Resp 16 | Wt 161.0 lb

## 2017-04-10 DIAGNOSIS — I10 Essential (primary) hypertension: Secondary | ICD-10-CM | POA: Diagnosis not present

## 2017-04-10 DIAGNOSIS — I701 Atherosclerosis of renal artery: Secondary | ICD-10-CM

## 2017-04-10 DIAGNOSIS — E1159 Type 2 diabetes mellitus with other circulatory complications: Secondary | ICD-10-CM | POA: Diagnosis not present

## 2017-04-10 DIAGNOSIS — I6523 Occlusion and stenosis of bilateral carotid arteries: Secondary | ICD-10-CM | POA: Diagnosis not present

## 2017-04-10 DIAGNOSIS — I150 Renovascular hypertension: Secondary | ICD-10-CM

## 2017-04-10 DIAGNOSIS — E785 Hyperlipidemia, unspecified: Secondary | ICD-10-CM

## 2017-04-10 NOTE — Assessment & Plan Note (Signed)
blood glucose control important in reducing the progression of atherosclerotic disease. Also, involved in wound healing. On appropriate medications.  

## 2017-04-10 NOTE — Assessment & Plan Note (Signed)
blood pressure control important in reducing the progression of atherosclerotic disease. On appropriate oral medications.  

## 2017-04-10 NOTE — Progress Notes (Signed)
MRN : [id number]  Tamara Finley is a 76 y.o. ([birth year]) female who presents with chief complaint of  Chief Complaint  Patient presents with  . ultrasound follow up  .  History of Present Illness: Patient returns today in follow up of RAS. The patient states that her renal function is stable with a creatinine clearance of about 47. Her blood pressure is up and down. She is on 3 antihypertensives. Her renal artery duplex today demonstrates a known atrophic right kidney with a patent left renal artery stent and what appears to be a patent aortobiiliac bypass below the stent. She has not had her carotids checked recently but is due to have this checked later this year. She denies any focal neurologic symptoms. Specifically, the patient denies amaurosis fugax, speech or swallowing difficulties, or arm or leg weakness or numbness. She is battling breast cancer.  Current Outpatient Prescriptions  Medication Sig Dispense Refill  . acetaminophen (TYLENOL) 500 MG tablet Take by mouth.    Marland Kitchen albuterol (PROVENTIL HFA;VENTOLIN HFA) 108 (90 BASE) MCG/ACT inhaler Inhale 2 puffs into the lungs every 6 (six) hours as needed for wheezing or shortness of breath. Reported on 12/31/2015    . anastrozole (ARIMIDEX) 1 MG tablet Take by mouth.    . Ascorbic Acid (VITAMIN C) POWD Take by mouth.    Marland Kitchen aspirin 81 MG chewable tablet Chew 81 mg by mouth daily.    Marland Kitchen atorvastatin (LIPITOR) 40 MG tablet Take by mouth.    . Calcium Carbonate-Vitamin D (CALCIUM 600+D) 600-400 MG-UNIT tablet Take 1 tablet by mouth 2 (two) times daily with a meal.    . cholecalciferol (VITAMIN D) 1000 units tablet Take 2,000 Units by mouth daily. Reported on 12/31/2015    . fluticasone (FLONASE) 50 MCG/ACT nasal spray Place 1-2 sprays into both nostrils daily as needed for rhinitis.    . furosemide (LASIX) 40 MG tablet Take 1 tablet by mouth 1 day or 1 dose.    . gabapentin (NEURONTIN) 300 MG capsule Take 1 capsule by mouth 1 day or 1 dose.      . hydrALAZINE (APRESOLINE) 25 MG tablet Take 25 mg by mouth 3 (three) times daily.    Marland Kitchen levothyroxine (SYNTHROID, LEVOTHROID) 88 MCG tablet Take 88 mcg by mouth daily before breakfast.    . oxyCODONE (OXY IR/ROXICODONE) 5 MG immediate release tablet Take by mouth.    . pantoprazole (PROTONIX) 40 MG tablet Take 40 mg by mouth daily.    Marland Kitchen PEG 3350-KCl-NaBcb-NaCl-NaSulf (PEG-3350/ELECTROLYTES PO) Take 4,000 mLs by mouth as directed.    . polyethylene glycol powder (GLYCOLAX/MIRALAX) powder Take by mouth.    . potassium chloride SA (K-DUR,KLOR-CON) 20 MEQ tablet Take 20 mEq by mouth as directed.    . senna-docusate (SENOKOT-S) 8.6-50 MG tablet Take by mouth.    . traMADol (ULTRAM) 50 MG tablet Take 50 mg by mouth every 6 (six) hours as needed for moderate pain.     . vitamin B-12 (CYANOCOBALAMIN) 1000 MCG tablet Take 1 tablet by mouth 1 day or 1 dose.    Marland Kitchen amLODipine (NORVASC) 5 MG tablet Take 5 mg by mouth daily.    . clopidogrel (PLAVIX) 75 MG tablet Take 75 mg by mouth daily.    . felodipine (PLENDIL) 10 MG 24 hr tablet Take 10 mg by mouth daily. Reported on 12/31/2015    . GAVILYTE-N WITH FLAVOR PACK 420 g solution     . hydrochlorothiazide (HYDRODIURIL) 25 MG tablet Take 25  mg by mouth daily.    . magnesium 30 MG tablet Take 250 mg by mouth daily.    . metoprolol succinate (TOPROL-XL) 50 MG 24 hr tablet Take 50 mg by mouth daily. Reported on 12/31/2015    . omega-3 acid ethyl esters (LOVAZA) 1 g capsule Take 1 g by mouth daily.    . simvastatin (ZOCOR) 80 MG tablet Take 80 mg by mouth at bedtime. Reported on 12/31/2015    . sodium chloride (OCEAN) 0.65 % SOLN nasal spray Place 1-2 sprays into both nostrils as needed for congestion.     No current facility-administered medications for this visit.     Past Medical History:  Diagnosis Date  . Abdominal aortic aneurysm (AAA) ([place]) 2000's  . Anxiety   . Anxiety and depression   . Breast calcification, left   . Carotid stenosis   . Chronic  insomnia   . COPD (chronic obstructive pulmonary disease) ([place])   . CRI (chronic renal insufficiency), stage 3 (moderate)   . Depression   . Diabetes mellitus without complication ([place])   . GERD (gastroesophageal reflux disease)   . Heart palpitations   . History of AAA (abdominal aortic aneurysm) repair   . History of TIA (transient ischemic attack)   . Hypercholesterolemia   . Hypertension   . Hypothyroidism   . Migraine headache   . Osteoarthritis   . Osteoporosis   . Peripheral arterial disease ([place])   . Renal disorder   . SOB (shortness of breath)   . Stenosis of left subclavian artery (HCC)   . Thyroid disease   . TIA (transient ischemic attack)   . Type 2 diabetes mellitus with vascular disease [facility])     Past Surgical History:  Procedure Laterality Date  . ABDOMINAL AORTIC ANEURYSM REPAIR    . BREAST BIOPSY Left 09/03/2016   benign  . BREAST BIOPSY Left 09/25/2016   path pending  . BREAST EXCISIONAL BIOPSY Left 2010  . BREAST EXCISIONAL BIOPSY Left 07/01/2006  . CARDIAC SURGERY    . PERIPHERAL VASCULAR CATHETERIZATION N/A 12/05/2015   Procedure: Renal Angiography;  Surgeon: Katha Cabal, MD;  Location: [facility];  Service: Cardiovascular;  Laterality: N/A;  . PERIPHERAL VASCULAR CATHETERIZATION Left 12/31/2015   Procedure: Carotid PTA/Stent Intervention;  Surgeon: Algernon Huxley, MD;  Location: [facility];  Service: Cardiovascular;  Laterality: Left;    Social History Social History  Substance Use Topics  . Smoking status: Former Smoker    Packs/day: 2.00    Years: 50.00    Types: Cigarettes    Quit date: 06/01/2013  . Smokeless tobacco: Never Used  . Alcohol use No    Family History Family History  Problem Relation Age of Onset  . CVA Mother   . Hypertension Mother   . Stroke Mother   . Epilepsy Mother   . Dementia Father   . Stroke Father   . Coronary artery disease Father   . Hypertension Father   . Ovarian cancer Sister   .  Cancer Brother   . Diabetes Brother     Allergies  Allergen Reactions  . Benadryl [Diphenhydramine Hcl (Sleep)] Other (See Comments)    BAD DREAMS  . Diphenhydramine Hcl Other (See Comments)    Makes her "hyper"  . Alprazolam Rash    "bad dreams"     REVIEW OF SYSTEMS (Negative unless checked)  Constitutional: [] Weight loss  [] Fever  [] Chills Cardiac: [] Chest pain   [] Chest pressure   []   Palpitations   [] Shortness of breath when laying flat   [] Shortness of breath at rest   [x] Shortness of breath with exertion. Vascular:  [] Pain in legs with walking   [] Pain in legs at rest   [] Pain in legs when laying flat   [] Claudication   [] Pain in feet when walking  [] Pain in feet at rest  [] Pain in feet when laying flat   [] History of DVT   [] Phlebitis   [] Swelling in legs   [] Varicose veins   [] Non-healing ulcers Pulmonary:   [] Uses home oxygen   [] Productive cough   [] Hemoptysis   [] Wheeze  [x] COPD   [] Asthma Neurologic:  [] Dizziness  [] Blackouts   [] Seizures   [] History of stroke   [x] History of TIA  [] Aphasia   [] Temporary blindness   [] Dysphagia   [] Weakness or numbness in arms   [] Weakness or numbness in legs Musculoskeletal:  [] Arthritis   [] Joint swelling   [] Joint pain   [] Low back pain Hematologic:  [] Easy bruising  [] Easy bleeding   [] Hypercoagulable state   [] Anemic   Gastrointestinal:  [] Blood in stool   [] Vomiting blood  [] Gastroesophageal reflux/heartburn   [] Abdominal pain Genitourinary:  [x] Chronic kidney disease   [] Difficult urination  [] Frequent urination  [] Burning with urination   [] Hematuria Skin:  [] Rashes   [] Ulcers   [] Wounds Psychological:  [] History of anxiety   []  History of major depression.  Physical Examination  BP (!) 147/86   Pulse 87   Resp 16   Wt 73 kg (161 lb)   BMI 27.64 kg/m  Gen:  WD/WN, NAD Head: [place]/AT, No temporalis wasting. Ear/Nose/Throat: Hearing grossly intact, nares w/o erythema or drainage, trachea midline Eyes: Conjunctiva clear. Sclera  non-icteric Neck: Supple.  No JVD.  Pulmonary:  Good air movement, no use of accessory muscles.  Cardiac: RRR, normal S1, S2 Vascular:  Vessel Right Left  Radial Palpable Palpable                                    Musculoskeletal: M/S 5/5 throughout.  No deformity or atrophy. Neurologic: Sensation grossly intact in extremities.  Symmetrical.  Speech is fluent.  Psychiatric: Judgment intact, Mood & affect appropriate for pt's clinical situation. Dermatologic: No rashes or ulcers noted.  No cellulitis or open wounds.       Labs No results found for this or any previous visit (from the past 2160 hour(s)).  Radiology No results found.    Assessment/Plan  Hypertension blood pressure control important in reducing the progression of atherosclerotic disease. On appropriate oral medications.   Type 2 diabetes mellitus with vascular disease (HCC) blood glucose control important in reducing the progression of atherosclerotic disease. Also, involved in wound healing. On appropriate medications.   Hyperlipidemia, unspecified lipid control important in reducing the progression of atherosclerotic disease. Continue statin therapy   Carotid atherosclerosis, bilateral To be checked later this year. No recent symptoms  Renal artery stenosis (HCC) Her renal artery duplex today demonstrates a known atrophic right kidney with a patent left renal artery stent and what appears to be a patent aortobiiliac bypass below the stent.  Overall her renal artery perfusion appears to be stable with her solitary left kidney. Continue her current medical regimen. Plan to recheck with duplex in 1 year.    Leotis Pain, MD  04/10/2017 9:51 AM    This note was created with Dragon medical transcription system.  Any errors from  dictation are purely unintentional

## 2017-04-10 NOTE — Assessment & Plan Note (Signed)
lipid control important in reducing the progression of atherosclerotic disease. Continue statin therapy  

## 2017-04-10 NOTE — Assessment & Plan Note (Signed)
To be checked later this year. No recent symptoms

## 2017-04-10 NOTE — Assessment & Plan Note (Signed)
Her renal artery duplex today demonstrates a known atrophic right kidney with a patent left renal artery stent and what appears to be a patent aortobiiliac bypass below the stent.  Overall her renal artery perfusion appears to be stable with her solitary left kidney. Continue her current medical regimen. Plan to recheck with duplex in 1 year.

## 2017-04-10 NOTE — Patient Instructions (Signed)
Renal Artery Stenosis Renal artery stenosis (RAS) is narrowing of the artery that carries blood to your kidneys. It can affect one or both kidneys. Your kidneys filter waste and extra fluid from your blood. You get rid of the waste and fluid when you urinate. Your kidneys also make an important chemical messenger (hormone) called renin. Renin helps regulate your blood pressure. The first sign of RAS may be high blood pressure. Over time, other symptoms can develop. What are the causes? Plaque buildup in your arteries (atherosclerosis) is the main cause of RAS. The plaques that cause this are made up of:  Fat.  Cholesterol.  Calcium.  Other substances.  As these substances build up in your renal artery, this slows the blood supply to your kidneys. The lack of blood and oxygen causes the signs and symptoms of RAS. A much less common cause of RAS is a disease called fibromuscular dysplasia. This disease causes abnormal cell growth that narrows the renal artery. It is not related to atherosclerosis. It occurs mostly in women who are 25-50 years old. It may be passed down through families. What increases the risk? You may be at risk for renal artery stenosis if you:  Are a man who is at least 75 years old.  Are a woman who is at least 75 years old.  Have high blood pressure.  Have high cholesterol.  Are a smoker.  Abuse alcohol.  Have diabetes or prediabetes.  Are overweight.  Have a family history of early heart disease.  What are the signs or symptoms? RAS usually develops slowly. You may not have any signs or symptoms at first. The earliest signs may be:  Developing high blood pressure.  A sudden increase in existing high blood pressure.  No longer responding to medicine that used to control your blood pressure.  Later signs and symptoms are due to kidney damage. They may include:  Fatigue.  Shortness of breath.  Swollen legs and feet.  Dry  skin.  Headaches.  Muscle cramps.  Loss of appetite.  Nausea or vomiting.  How is this diagnosed? Your health care provider may suspect RAS based on changes in your blood pressure and your risk factors. A physical exam will be done. Your health care provider may use a stethoscope to listen for a whooshing sound (bruit) that can occur where the renal artery is blocking blood flow. Several tests may be done to confirm a diagnosis of RAS. These may include:  Blood and urine tests to check your kidney function.  Imaging tests of your kidneys, such as: ? A test that involves using sound waves to create an image of your kidneys and the blood flow to your kidneys (ultrasound). ? A test in which dye is injected into one of your blood vessels so images can be taken as the dye flows through your renal arteries (angiogram). These tests can be done using X-rays, a CT scan (computed tomography angiogram, CTA), or a type of MRI (magnetic resonance angiogram, MRA).  How is this treated? Making lifestyle changes to reduce your risk factors is the first treatment option for early RAS. If the blood flow to one of your kidneys is cut by more than half, you may need medicine to:  Lower your blood pressure. This is the main medical treatment for RAS. You may need more than one type of medicine for this. The two types that work best for RAS are: ? ACE inhibitors. ? Angiotensin receptor blockers.  Reduce fluid   in the body (diuretics).  Lower your cholesterol (statins).  If medicine is not enough to control RAS, you may need surgery. This may involve:  Threading a tube with an inflatable balloon into the renal artery to force it open (angioplasty).  Removing plaque from inside the artery (endarterectomy).  Follow these instructions at home:  Take medicines only as directed by your health care provider.  Make any lifestyle changes recommended by your health care provider. This may include: ? Working  with a dietitian to maintain a heart-healthy diet. This type of diet is low in saturated fat, salt, and added sugar. ? Starting an exercise program as directed by your health care provider. ? Maintaining a healthy weight. ? Quitting smoking. ? Not abusing alcohol.  Keep all follow-up visits as directed by your health care provider. This is important. Contact a health care provider if:  Your symptoms of RAS are not getting better.  Your symptoms are changing or getting worse. Get help right away if:  You have very bad pain in your back or abdomen.  You have blood in your urine. This information is not intended to replace advice given to you by your health care provider. Make sure you discuss any questions you have with your health care provider. Document Released: 05/14/2005 Document Revised: 01/24/2016 Document Reviewed: 12/01/2013 Elsevier Interactive Patient Education  2018 [employer]vier Inc.  

## 2017-04-15 ENCOUNTER — Encounter (INDEPENDENT_AMBULATORY_CARE_PROVIDER_SITE_OTHER): Payer: Medicare HMO

## 2017-04-21 ENCOUNTER — Ambulatory Visit (INDEPENDENT_AMBULATORY_CARE_PROVIDER_SITE_OTHER): Payer: Medicare HMO

## 2017-04-21 DIAGNOSIS — I6523 Occlusion and stenosis of bilateral carotid arteries: Secondary | ICD-10-CM | POA: Diagnosis not present

## 2017-04-22 ENCOUNTER — Encounter (INDEPENDENT_AMBULATORY_CARE_PROVIDER_SITE_OTHER): Payer: Self-pay | Admitting: Vascular Surgery

## 2017-06-01 DIAGNOSIS — D631 Anemia in chronic kidney disease: Secondary | ICD-10-CM | POA: Insufficient documentation

## 2017-06-01 DIAGNOSIS — N183 Chronic kidney disease, stage 3 unspecified: Secondary | ICD-10-CM | POA: Insufficient documentation

## 2017-06-12 ENCOUNTER — Encounter: Payer: Self-pay | Admitting: *Deleted

## 2017-06-15 ENCOUNTER — Encounter
Admission: RE | Disposition: A | Discharge status: Home / Self Care | Payer: Self-pay | Source: Ambulatory Visit | Attending: Gastroenterology

## 2017-06-15 ENCOUNTER — Ambulatory Visit
Admission: RE | Admit: 2017-06-15 | Discharge: 2017-06-15 | Disposition: A | Discharge status: Home / Self Care | Payer: Medicare HMO | Source: Ambulatory Visit | Attending: Gastroenterology | Admitting: Gastroenterology

## 2017-06-15 ENCOUNTER — Ambulatory Visit: Payer: Medicare HMO | Admitting: Anesthesiology

## 2017-06-15 DIAGNOSIS — I714 Abdominal aortic aneurysm, without rupture: Secondary | ICD-10-CM | POA: Insufficient documentation

## 2017-06-15 DIAGNOSIS — N183 Chronic kidney disease, stage 3 (moderate): Secondary | ICD-10-CM | POA: Insufficient documentation

## 2017-06-15 DIAGNOSIS — Z853 Personal history of malignant neoplasm of breast: Secondary | ICD-10-CM | POA: Diagnosis not present

## 2017-06-15 DIAGNOSIS — E039 Hypothyroidism, unspecified: Secondary | ICD-10-CM | POA: Insufficient documentation

## 2017-06-15 DIAGNOSIS — Z8679 Personal history of other diseases of the circulatory system: Secondary | ICD-10-CM | POA: Insufficient documentation

## 2017-06-15 DIAGNOSIS — F419 Anxiety disorder, unspecified: Secondary | ICD-10-CM | POA: Diagnosis not present

## 2017-06-15 DIAGNOSIS — I129 Hypertensive chronic kidney disease with stage 1 through stage 4 chronic kidney disease, or unspecified chronic kidney disease: Secondary | ICD-10-CM | POA: Diagnosis not present

## 2017-06-15 DIAGNOSIS — Z7902 Long term (current) use of antithrombotics/antiplatelets: Secondary | ICD-10-CM | POA: Insufficient documentation

## 2017-06-15 DIAGNOSIS — Z8673 Personal history of transient ischemic attack (TIA), and cerebral infarction without residual deficits: Secondary | ICD-10-CM | POA: Insufficient documentation

## 2017-06-15 DIAGNOSIS — Z79899 Other long term (current) drug therapy: Secondary | ICD-10-CM | POA: Diagnosis not present

## 2017-06-15 DIAGNOSIS — E7800 Pure hypercholesterolemia, unspecified: Secondary | ICD-10-CM | POA: Insufficient documentation

## 2017-06-15 DIAGNOSIS — E1122 Type 2 diabetes mellitus with diabetic chronic kidney disease: Secondary | ICD-10-CM | POA: Diagnosis not present

## 2017-06-15 DIAGNOSIS — Z86010 Personal history of colonic polyps: Secondary | ICD-10-CM | POA: Insufficient documentation

## 2017-06-15 DIAGNOSIS — I2510 Atherosclerotic heart disease of native coronary artery without angina pectoris: Secondary | ICD-10-CM | POA: Diagnosis not present

## 2017-06-15 DIAGNOSIS — J449 Chronic obstructive pulmonary disease, unspecified: Secondary | ICD-10-CM | POA: Diagnosis not present

## 2017-06-15 DIAGNOSIS — F329 Major depressive disorder, single episode, unspecified: Secondary | ICD-10-CM | POA: Insufficient documentation

## 2017-06-15 DIAGNOSIS — E1151 Type 2 diabetes mellitus with diabetic peripheral angiopathy without gangrene: Secondary | ICD-10-CM | POA: Diagnosis not present

## 2017-06-15 DIAGNOSIS — Z87891 Personal history of nicotine dependence: Secondary | ICD-10-CM | POA: Diagnosis not present

## 2017-06-15 DIAGNOSIS — Q438 Other specified congenital malformations of intestine: Secondary | ICD-10-CM | POA: Diagnosis not present

## 2017-06-15 DIAGNOSIS — K219 Gastro-esophageal reflux disease without esophagitis: Secondary | ICD-10-CM | POA: Diagnosis not present

## 2017-06-15 DIAGNOSIS — Z7982 Long term (current) use of aspirin: Secondary | ICD-10-CM | POA: Diagnosis not present

## 2017-06-15 HISTORY — DX: Occlusion and stenosis of bilateral carotid arteries: I65.23

## 2017-06-15 HISTORY — DX: Other disorders of lung: J98.4

## 2017-06-15 HISTORY — DX: Atherosclerotic heart disease of native coronary artery without angina pectoris: I25.10

## 2017-06-15 HISTORY — DX: Senile entropion of unspecified eye, unspecified eyelid: H02.039

## 2017-06-15 HISTORY — DX: Unspecified chronic bronchitis: J42

## 2017-06-15 HISTORY — DX: Cerebral infarction, unspecified: I63.9

## 2017-06-15 HISTORY — DX: Atherosclerosis of renal artery: I70.1

## 2017-06-15 HISTORY — DX: Personal history of colon polyps, unspecified: Z86.0100

## 2017-06-15 HISTORY — PX: COLONOSCOPY WITH PROPOFOL: SHX5780

## 2017-06-15 HISTORY — DX: Personal history of colonic polyps: Z86.010

## 2017-06-15 HISTORY — DX: Malignant (primary) neoplasm, unspecified: C80.1

## 2017-06-15 SURGERY — COLONOSCOPY WITH PROPOFOL
Anesthesia: General

## 2017-06-15 MED ORDER — PROPOFOL 10 MG/ML IV BOLUS
INTRAVENOUS | Status: DC | PRN
  Administered 2017-06-15: 100 mg via INTRAVENOUS

## 2017-06-15 MED ORDER — SODIUM CHLORIDE 0.9 % IV SOLN
INTRAVENOUS | Status: DC
  Administered 2017-06-15: 1000 mL via INTRAVENOUS
  Administered 2017-06-15: 09:00:00 via INTRAVENOUS

## 2017-06-15 MED ORDER — SODIUM CHLORIDE 0.9 % IV SOLN: INTRAVENOUS | Status: DC

## 2017-06-15 MED ORDER — PROPOFOL 500 MG/50ML IV EMUL
INTRAVENOUS | Status: AC
  Filled 2017-06-15: qty 50

## 2017-06-15 MED ORDER — FENTANYL CITRATE (PF) 100 MCG/2ML IJ SOLN
INTRAMUSCULAR | Status: DC | PRN
  Administered 2017-06-15: 50 ug via INTRAVENOUS

## 2017-06-15 MED ORDER — PHENYLEPHRINE HCL 10 MG/ML IJ SOLN
INTRAMUSCULAR | Status: DC | PRN
  Administered 2017-06-15 (×2): 100 ug via INTRAVENOUS

## 2017-06-15 MED ORDER — LIDOCAINE 2% (20 MG/ML) 5 ML SYRINGE
INTRAMUSCULAR | Status: DC | PRN
  Administered 2017-06-15: 30 mg via INTRAVENOUS

## 2017-06-15 MED ORDER — PROPOFOL 10 MG/ML IV BOLUS
INTRAVENOUS | Status: AC
Start: 2017-06-15 — End: 2017-06-15
  Filled 2017-06-15: qty 20

## 2017-06-15 MED ORDER — LIDOCAINE HCL (PF) 2 % IJ SOLN
INTRAMUSCULAR | Status: AC
  Filled 2017-06-15: qty 10

## 2017-06-15 MED ORDER — PROPOFOL 500 MG/50ML IV EMUL
INTRAVENOUS | Status: DC | PRN
  Administered 2017-06-15: 160 ug/kg/min via INTRAVENOUS

## 2017-06-15 MED ORDER — FENTANYL CITRATE (PF) 100 MCG/2ML IJ SOLN
INTRAMUSCULAR | Status: AC
  Filled 2017-06-15: qty 2

## 2017-06-15 NOTE — Op Note (Signed)
[facility] Gastroenterology Patient Name: Tamara Finley Procedure Date: 06/15/2017 9:06 AM MRN: [id number] Account #: [id number] Date of Birth: 25-Aug-1941 Admit Type: Outpatient Age: 76 Room: [facility] ENDO ROOM 1 Gender: Female Note Status: Finalized Procedure:            Colonoscopy Indications:          Personal history of colonic polyps Providers:            Lollie Sails, MD Referring MD:         Christena Flake. Raechel Ache, MD (Referring MD) Medicines:            Monitored Anesthesia Care Complications:        No immediate complications. Procedure:            Pre-Anesthesia Assessment:                       - ASA Grade Assessment: III - A patient with severe                        systemic disease.                       After obtaining informed consent, the colonoscope was                        passed under direct vision. Throughout the procedure,                        the patient's blood pressure, pulse, and oxygen                        saturations were monitored continuously. The                        Colonoscope was introduced through the anus and                        advanced to the the cecum, identified by appendiceal                        orifice and ileocecal valve. The colonoscopy was                        performed with moderate difficulty due to a tortuous                        colon. The patient tolerated the procedure well. The                        quality of the bowel preparation was good. Findings:      The sigmoid colon, descending colon and transverse colon were       significantly redundant.      The exam was otherwise without abnormality.      The digital rectal exam was normal. Impression:           - Redundant colon.                       - The examination was otherwise normal.                       -  No specimens collected. Recommendation:       - Discharge patient to home.                       - Advance diet as  tolerated. Procedure Code(s):    --- Professional ---                       [id number], Colonoscopy, flexible; diagnostic, including                        collection of specimen(s) by brushing or washing, when                        performed (separate procedure) Diagnosis Code(s):    --- Professional ---                       Z86.010, Personal history of colonic polyps                       Q43.8, Other specified congenital malformations of                        intestine CPT copyright 2016 American Medical Association. All rights reserved. The codes documented in this report are preliminary and upon coder review may  be revised to meet current compliance requirements. Lollie Sails, MD 06/15/2017 9:46:30 AM This report has been signed electronically. Number of Addenda: 0 Note Initiated On: 06/15/2017 9:06 AM Scope Withdrawal Time: 0 hours 6 minutes 56 seconds  Total Procedure Duration: 0 hours 20 minutes 40 seconds       [facility]

## 2017-06-15 NOTE — Transfer of Care (Signed)
Immediate Anesthesia Transfer of Care Note  Patient: Tamara Finley  Procedure(s) Performed: COLONOSCOPY WITH PROPOFOL (N/A )  Patient Location: PACU and Endoscopy Unit  Anesthesia Type:General  Level of Consciousness: drowsy  Airway & Oxygen Therapy: Patient Spontanous Breathing and Patient connected to nasal cannula oxygen  Post-op Assessment: Report given to RN and Post -op Vital signs reviewed and stable  Post vital signs: Reviewed and stable  Last Vitals:  Vitals:   06/15/17 0822  BP: 134/80  Pulse: 93  Resp: 16  Temp: 36.7 C  SpO2: 100%    Last Pain:  Vitals:   06/15/17 0822  TempSrc: Tympanic  PainSc: 4          Complications: No apparent anesthesia complications

## 2017-06-15 NOTE — Anesthesia Preprocedure Evaluation (Signed)
Anesthesia Evaluation  Patient identified by MRN, date of birth, ID band Patient awake    Reviewed: Allergy & Precautions, NPO status , Patient's Chart, lab work & pertinent test results  History of Anesthesia Complications Negative for: history of anesthetic complications  Airway Mallampati: II  TM Distance: >3 FB Neck ROM: Full    Dental no notable dental hx.    Pulmonary neg sleep apnea, COPD,  COPD inhaler, former smoker,    breath sounds clear to auscultation- rhonchi (-) wheezing      Cardiovascular hypertension, + CAD (nonocclusive)  (-) Past MI, (-) Cardiac Stents and (-) CABG  Rhythm:Regular Rate:Normal - Systolic murmurs and - Diastolic murmurs Echo 5/79/03: NORMAL LEFT VENTRICULAR SYSTOLIC FUNCTION NORMAL RIGHT VENTRICULAR SYSTOLIC FUNCTION MILD VALVULAR REGURGITATION NO VALVULAR STENOSIS Closest EF: >55% (Estimated) Aortic: TRIVIAL AR Tricuspid: TRIVIAL TR Mitral: MILD MR   Neuro/Psych  Headaches, PSYCHIATRIC DISORDERS Anxiety Depression TIACVA, No Residual Symptoms    GI/Hepatic Neg liver ROS, GERD  ,  Endo/Other  diabetesHypothyroidism   Renal/GU Renal InsufficiencyRenal disease     Musculoskeletal  (+) Arthritis ,   Abdominal (+) - obese,   Peds  Hematology negative hematology ROS (+)   Anesthesia Other Findings Past Medical History: 2000's: Abdominal aortic aneurysm (AAA) ([place]) No date: Age related entropion, unspecified laterality No date: Anxiety No date: Anxiety and depression No date: Bilateral renal artery stenosis (HCC) No date: Breast calcification, left 10/20/2016: Cancer (HCC)     Comment:  breast cancer No date: Carotid artery thrombosis, bilateral No date: Carotid stenosis No date: Chronic bronchitis (HCC) No date: Chronic insomnia No date: COPD (chronic obstructive pulmonary disease) (HCC) No date: Coronary artery disease No date: CRI (chronic renal insufficiency), stage 3  (moderate) (HCC) No date: Depression No date: Diabetes mellitus without complication (HCC) No date: GERD (gastroesophageal reflux disease) No date: Heart palpitations No date: History of AAA (abdominal aortic aneurysm) repair No date: History of colon polyps No date: History of TIA (transient ischemic attack) No date: Hypercholesterolemia No date: Hypertension No date: Hypothyroidism No date: Migraine headache No date: Osteoarthritis No date: Osteoporosis No date: Peripheral arterial disease (HCC) No date: Pulmonary scarring No date: Renal disorder No date: SOB (shortness of breath) No date: Stenosis of left subclavian artery (HCC) No date: Stroke ([place]) No date: Thyroid disease No date: TIA (transient ischemic attack) No date: Type 2 diabetes mellitus with vascular disease ([place])   Reproductive/Obstetrics                             Anesthesia Physical Anesthesia Plan  ASA: III  Anesthesia Plan: General   Post-op Pain Management:    Induction: Intravenous  PONV Risk Score and Plan: 2 and Propofol infusion  Airway Management Planned: Natural Airway  Additional Equipment:   Intra-op Plan:   Post-operative Plan:   Informed Consent: I have reviewed the patients History and Physical, chart, labs and discussed the procedure including the risks, benefits and alternatives for the proposed anesthesia with the patient or authorized representative who has indicated his/her understanding and acceptance.   Dental advisory given  Plan Discussed with: CRNA and Anesthesiologist  Anesthesia Plan Comments:         Anesthesia Quick Evaluation

## 2017-06-15 NOTE — Anesthesia Postprocedure Evaluation (Signed)
Anesthesia Post Note  Patient: SHAUNI HENNER  Procedure(s) Performed: COLONOSCOPY WITH PROPOFOL (N/A )  Patient location during evaluation: Endoscopy Anesthesia Type: General Level of consciousness: awake and alert and oriented Pain management: pain level controlled Vital Signs Assessment: post-procedure vital signs reviewed and stable Respiratory status: spontaneous breathing, nonlabored ventilation and respiratory function stable Cardiovascular status: blood pressure returned to baseline and stable Postop Assessment: no signs of nausea or vomiting Anesthetic complications: no     Last Vitals:  Vitals:   06/15/17 0959 06/15/17 1009  BP: 104/85 (!) 115/56  Pulse: 75 71  Resp: 17 16  Temp:    SpO2: 98% 99%    Last Pain:  Vitals:   06/15/17 0949  TempSrc: Tympanic  PainSc:                  Luke Rigsbee

## 2017-06-15 NOTE — H&P (Signed)
Outpatient short stay form Pre-procedure 06/15/2017 9:11 AM Tamara Sails MD  Primary Physician: Dr. Genene Churn  Reason for visit:  colonoscopy  History of present illness:  Patient is a 76 year old female presenting today as above. She has personal history of adenomatous colon polyps with her last procedure being 09/19/2011.she tolerated her prep well. She was prescribed Plavix after a carotid stent placement about a year ago but never filled that prescription per patient this is been verified she's never had it filled pharmacy. She does take a daily 81 mg aspirin. She denies use of other aspirin products or  Blood thinning agents.    Current Facility-Administered Medications:  .  0.9 %  sodium chloride infusion, , Intravenous, Continuous, Tamara Sails, MD, Last Rate: 20 mL/hr at 06/15/17 0836 .  0.9 %  sodium chloride infusion, , Intravenous, Continuous, Tamara Sails, MD  Prescriptions Prior to Admission  Medication Sig Dispense Refill Last Dose  . acetaminophen (TYLENOL) 500 MG tablet Take by mouth.   06/14/2017 at Unknown time  . albuterol (PROVENTIL HFA;VENTOLIN HFA) 108 (90 BASE) MCG/ACT inhaler Inhale 2 puffs into the lungs every 6 (six) hours as needed for wheezing or shortness of breath. Reported on 12/31/2015   06/14/2017 at Unknown time  . amLODipine (NORVASC) 5 MG tablet Take 5 mg by mouth daily.   06/15/2017 at 0600  . anastrozole (ARIMIDEX) 1 MG tablet Take by mouth.   06/14/2017 at Unknown time  . Ascorbic Acid (VITAMIN C) POWD Take by mouth.   Past Week at Unknown time  . aspirin 81 MG chewable tablet Chew 81 mg by mouth daily.   Past Week at Unknown time  . atorvastatin (LIPITOR) 40 MG tablet Take by mouth.   06/14/2017 at Unknown time  . Calcium Carbonate-Vitamin D (CALCIUM 600+D) 600-400 MG-UNIT tablet Take 1 tablet by mouth 2 (two) times daily with a meal.   Past Week at Unknown time  . cholecalciferol (VITAMIN D) 1000 units tablet Take 2,000 Units by mouth  daily. Reported on 12/31/2015   Past Week at Unknown time  . clopidogrel (PLAVIX) 75 MG tablet Take 75 mg by mouth daily.   06/14/2017 at Unknown time  . felodipine (PLENDIL) 10 MG 24 hr tablet Take 10 mg by mouth daily. Reported on 12/31/2015   Past Week at Unknown time  . fluticasone (FLONASE) 50 MCG/ACT nasal spray Place 1-2 sprays into both nostrils daily as needed for rhinitis.   06/14/2017 at Unknown time  . furosemide (LASIX) 40 MG tablet Take 1 tablet by mouth 1 day or 1 dose.   06/14/2017 at Unknown time  . gabapentin (NEURONTIN) 300 MG capsule Take 1 capsule by mouth 1 day or 1 dose.   06/14/2017 at Unknown time  . GAVILYTE-N WITH FLAVOR PACK 420 g solution    06/14/2017 at Unknown time  . hydrALAZINE (APRESOLINE) 25 MG tablet Take 25 mg by mouth 3 (three) times daily.   06/15/2017 at 0600  . hydrochlorothiazide (HYDRODIURIL) 25 MG tablet Take 25 mg by mouth daily.   06/14/2017 at Unknown time  . levothyroxine (SYNTHROID, LEVOTHROID) 88 MCG tablet Take 88 mcg by mouth daily before breakfast.   06/14/2017 at Unknown time  . magnesium 30 MG tablet Take 250 mg by mouth daily.   Past Week at Unknown time  . metoprolol succinate (TOPROL-XL) 50 MG 24 hr tablet Take 50 mg by mouth daily. Reported on 12/31/2015   06/15/2017 at 0600  . omega-3 acid ethyl esters (  LOVAZA) 1 g capsule Take 1 g by mouth daily.   Past Week at Unknown time  . oxyCODONE (OXY IR/ROXICODONE) 5 MG immediate release tablet Take by mouth.   Past Week at Unknown time  . pantoprazole (PROTONIX) 40 MG tablet Take 40 mg by mouth daily.   Past Week at Unknown time  . PEG 3350-KCl-NaBcb-NaCl-NaSulf (PEG-3350/ELECTROLYTES PO) Take 4,000 mLs by mouth as directed.   Past Week at Unknown time  . polyethylene glycol powder (GLYCOLAX/MIRALAX) powder Take by mouth.   Past Week at Unknown time  . potassium chloride SA (K-DUR,KLOR-CON) 20 MEQ tablet Take 20 mEq by mouth as directed.   Past Week at Unknown time  . senna-docusate (SENOKOT-S) 8.6-50  MG tablet Take by mouth.   Past Week at Unknown time  . simvastatin (ZOCOR) 80 MG tablet Take 80 mg by mouth at bedtime. Reported on 12/31/2015   Past Week at Unknown time  . sodium chloride (OCEAN) 0.65 % SOLN nasal spray Place 1-2 sprays into both nostrils as needed for congestion.   Past Week at Unknown time  . traMADol (ULTRAM) 50 MG tablet Take 50 mg by mouth every 6 (six) hours as needed for moderate pain.    Past Week at Unknown time  . vitamin B-12 (CYANOCOBALAMIN) 1000 MCG tablet Take 1 tablet by mouth 1 day or 1 dose.   Past Week at Unknown time     Allergies  Allergen Reactions  . Benadryl [Diphenhydramine Hcl (Sleep)] Other (See Comments)    BAD DREAMS  . Diphenhydramine Hcl Other (See Comments)    Makes her "hyper"  . Alprazolam Rash    "bad dreams"     Past Medical History:  Diagnosis Date  . Abdominal aortic aneurysm (AAA) ([place]) 2000's  . Age related entropion, unspecified laterality   . Anxiety   . Anxiety and depression   . Bilateral renal artery stenosis ([place])   . Breast calcification, left   . Cancer ([place]) 10/20/2016   breast cancer  . Carotid artery thrombosis, bilateral   . Carotid stenosis   . Chronic bronchitis ([place])   . Chronic insomnia   . COPD (chronic obstructive pulmonary disease) ([place])   . Coronary artery disease   . CRI (chronic renal insufficiency), stage 3 (moderate) (HCC)   . Depression   . Diabetes mellitus without complication ([place])   . GERD (gastroesophageal reflux disease)   . Heart palpitations   . History of AAA (abdominal aortic aneurysm) repair   . History of colon polyps   . History of TIA (transient ischemic attack)   . Hypercholesterolemia   . Hypertension   . Hypothyroidism   . Migraine headache   . Osteoarthritis   . Osteoporosis   . Peripheral arterial disease ([place])   . Pulmonary scarring   . Renal disorder   . SOB (shortness of breath)   . Stenosis of left subclavian artery (HCC)   . Stroke ([place])   . Thyroid disease    . TIA (transient ischemic attack)   . Type 2 diabetes mellitus with vascular disease ([place])     Review of systems:      Physical Exam    Heart and lungs: regular rate and rhythm without rub or gallop, lungs are bilaterally clear    HEENT: normocephalic atraumatic eyes are anicteric    Other:     Pertinant exam for procedure: soft nontender nondistended bowel sounds positiveive    Planned proceedures: colonoscopy and indicated procedures. I have discussed the  risks benefits and complications of procedures to include not limited to bleeding, infection, perforation and the risk of sedation and the patient wishes to proceed.    Tamara Sails, MD Gastroenterology 06/15/2017  9:11 AM

## 2017-06-15 NOTE — Anesthesia Post-op Follow-up Note (Signed)
Anesthesia QCDR form completed.        

## 2017-06-16 ENCOUNTER — Encounter: Payer: Self-pay | Admitting: Gastroenterology

## 2017-11-04 ENCOUNTER — Ambulatory Visit: Payer: Medicare HMO | Admitting: Student in an Organized Health Care Education/Training Program

## 2017-11-09 ENCOUNTER — Encounter: Payer: Self-pay | Admitting: Cardiology

## 2017-12-07 DIAGNOSIS — R7302 Impaired glucose tolerance (oral): Secondary | ICD-10-CM | POA: Insufficient documentation

## 2018-02-18 ENCOUNTER — Ambulatory Visit: Payer: Medicare HMO | Admitting: Student in an Organized Health Care Education/Training Program

## 2018-04-06 ENCOUNTER — Other Ambulatory Visit: Payer: Self-pay

## 2018-04-06 ENCOUNTER — Ambulatory Visit
Payer: Medicare HMO | Attending: Student in an Organized Health Care Education/Training Program | Admitting: Student in an Organized Health Care Education/Training Program

## 2018-04-06 ENCOUNTER — Encounter: Payer: Self-pay | Admitting: Student in an Organized Health Care Education/Training Program

## 2018-04-06 ENCOUNTER — Ambulatory Visit: Payer: Medicare HMO | Admitting: Student in an Organized Health Care Education/Training Program

## 2018-04-06 VITALS — BP 154/76 | HR 77 | Temp 97.8°F | Resp 18 | Ht 64.25 in | Wt 177.0 lb

## 2018-04-06 DIAGNOSIS — M5136 Other intervertebral disc degeneration, lumbar region: Secondary | ICD-10-CM | POA: Diagnosis not present

## 2018-04-06 DIAGNOSIS — J449 Chronic obstructive pulmonary disease, unspecified: Secondary | ICD-10-CM | POA: Diagnosis not present

## 2018-04-06 DIAGNOSIS — E785 Hyperlipidemia, unspecified: Secondary | ICD-10-CM | POA: Diagnosis not present

## 2018-04-06 DIAGNOSIS — E1169 Type 2 diabetes mellitus with other specified complication: Secondary | ICD-10-CM | POA: Diagnosis not present

## 2018-04-06 DIAGNOSIS — M5442 Lumbago with sciatica, left side: Secondary | ICD-10-CM | POA: Diagnosis not present

## 2018-04-06 DIAGNOSIS — E039 Hypothyroidism, unspecified: Secondary | ICD-10-CM | POA: Diagnosis not present

## 2018-04-06 DIAGNOSIS — Z7982 Long term (current) use of aspirin: Secondary | ICD-10-CM | POA: Diagnosis not present

## 2018-04-06 DIAGNOSIS — N189 Chronic kidney disease, unspecified: Secondary | ICD-10-CM | POA: Insufficient documentation

## 2018-04-06 DIAGNOSIS — M5416 Radiculopathy, lumbar region: Secondary | ICD-10-CM

## 2018-04-06 DIAGNOSIS — M25552 Pain in left hip: Secondary | ICD-10-CM

## 2018-04-06 DIAGNOSIS — Z79890 Hormone replacement therapy (postmenopausal): Secondary | ICD-10-CM | POA: Diagnosis not present

## 2018-04-06 DIAGNOSIS — I1310 Hypertensive heart and chronic kidney disease without heart failure, with stage 1 through stage 4 chronic kidney disease, or unspecified chronic kidney disease: Secondary | ICD-10-CM | POA: Insufficient documentation

## 2018-04-06 DIAGNOSIS — Z8673 Personal history of transient ischemic attack (TIA), and cerebral infarction without residual deficits: Secondary | ICD-10-CM | POA: Diagnosis not present

## 2018-04-06 DIAGNOSIS — E7800 Pure hypercholesterolemia, unspecified: Secondary | ICD-10-CM | POA: Diagnosis not present

## 2018-04-06 DIAGNOSIS — F419 Anxiety disorder, unspecified: Secondary | ICD-10-CM | POA: Diagnosis present

## 2018-04-06 DIAGNOSIS — I6523 Occlusion and stenosis of bilateral carotid arteries: Secondary | ICD-10-CM | POA: Diagnosis not present

## 2018-04-06 DIAGNOSIS — G894 Chronic pain syndrome: Secondary | ICD-10-CM | POA: Diagnosis not present

## 2018-04-06 DIAGNOSIS — K219 Gastro-esophageal reflux disease without esophagitis: Secondary | ICD-10-CM | POA: Insufficient documentation

## 2018-04-06 DIAGNOSIS — Z7951 Long term (current) use of inhaled steroids: Secondary | ICD-10-CM | POA: Diagnosis not present

## 2018-04-06 DIAGNOSIS — Z79899 Other long term (current) drug therapy: Secondary | ICD-10-CM | POA: Diagnosis not present

## 2018-04-06 DIAGNOSIS — Z87891 Personal history of nicotine dependence: Secondary | ICD-10-CM | POA: Diagnosis not present

## 2018-04-06 DIAGNOSIS — G8929 Other chronic pain: Secondary | ICD-10-CM | POA: Insufficient documentation

## 2018-04-06 DIAGNOSIS — Z6830 Body mass index (BMI) 30.0-30.9, adult: Secondary | ICD-10-CM | POA: Diagnosis present

## 2018-04-06 DIAGNOSIS — M25551 Pain in right hip: Secondary | ICD-10-CM

## 2018-04-06 DIAGNOSIS — M810 Age-related osteoporosis without current pathological fracture: Secondary | ICD-10-CM | POA: Diagnosis not present

## 2018-04-06 DIAGNOSIS — F329 Major depressive disorder, single episode, unspecified: Secondary | ICD-10-CM | POA: Diagnosis not present

## 2018-04-06 DIAGNOSIS — M5441 Lumbago with sciatica, right side: Secondary | ICD-10-CM

## 2018-04-06 DIAGNOSIS — I2510 Atherosclerotic heart disease of native coronary artery without angina pectoris: Secondary | ICD-10-CM | POA: Diagnosis not present

## 2018-04-06 DIAGNOSIS — G43909 Migraine, unspecified, not intractable, without status migrainosus: Secondary | ICD-10-CM | POA: Insufficient documentation

## 2018-04-06 NOTE — Progress Notes (Signed)
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 Hypothyroidism, Migraine headache, Osteoarthritis, Osteoporosis, Peripheral arterial disease ([place]), Pulmonary scarring, Renal disorder, SOB (shortness of breath), Stenosis of left subclavian artery ([place]), Stroke ([place]), Thyroid disease, TIA (transient ischemic attack), and Type 2 diabetes mellitus with vascular disease ([place]). Family: family history includes CVA in her mother; Cancer in her brother; Coronary artery disease in her father; Dementia in her father; Diabetes in her brother; Epilepsy in her mother; Hypertension in her father and mother; Ovarian cancer in her sister; Stroke in her father and mother.  Past Surgical History:  Procedure Laterality Date  . ABDOMINAL AORTIC ANEURYSM REPAIR    . BACK SURGERY    . BREAST BIOPSY Left 09/03/2016   benign  . BREAST BIOPSY Left 09/25/2016   path pending  . BREAST EXCISIONAL BIOPSY Left 2010  . BREAST EXCISIONAL BIOPSY Left 07/01/2006  . BRONCHOSCOPY    . CARDIAC SURGERY    . CAROTID PTA/STENT INTERVENTION    . COLONOSCOPY    . COLONOSCOPY  WITH PROPOFOL N/A 06/15/2017   Procedure: COLONOSCOPY WITH PROPOFOL;  Surgeon: Lollie Sails, MD;  Location[facility]COPY;  Service: Endoscopy;  Laterality:  N/A;  . EYE SURGERY    . LAMINECTOMY  07/25/2013  . MASTECTOMY    . MEDIASTINOSCOPY    . PERIPHERAL VASCULAR CATHETERIZATION N/A 12/05/2015   Procedure: Renal Angiography;  Surgeon: Katha Cabal, MD;  Location: [place] CV LAB;  Service: Cardiovascular;  Laterality: N/A;  . PERIPHERAL VASCULAR CATHETERIZATION Left 12/31/2015   Procedure: Carotid PTA/Stent Intervention;  Surgeon: Algernon Huxley, MD;  Location: [place] CV LAB;  Service: Cardiovascular;  Laterality: Left;  . THORACOSCOPY  12/16/2016   Active Ambulatory Problems    Diagnosis Date Noted  . TIA (transient ischemic attack) 10/17/2015  . 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 Malignant hypertension 12/06/2015  . Carotid stenosis 12/31/2015  . Adenomatous colon polyp 05/27/2016  . Anxiety and depression 02/08/2014  . Breast calcification, left 04/06/2014  . Carotid atherosclerosis, bilateral 10/25/2015  . Chronic insomnia 02/08/2014  . COPD (chronic obstructive pulmonary disease) ([place]) 08/11/2014  . CRI (chronic renal insufficiency), stage 3 (moderate) ([place]) 08/11/2014  . Elevated blood sugar 07/08/2016  . GERD (gastroesophageal reflux disease) 07/18/2013  . Heart palpitations 04/01/2016  . History of AAA (abdominal aortic aneurysm) repair 07/18/2013  . History of TIA (transient ischemic attack) 10/25/2015  . Hyperlipidemia, unspecified 10/09/2016  . Hypertension 07/18/2013  . Hypothyroidism 07/18/2013  . Left breast mass 08/15/2016  . Low back pain radiating to both legs 08/21/2015  . Lumbar stenosis 06/01/2013  . Migraine headache 10/09/2016  . Osteoarthritis 10/09/2016  . Osteoporosis, postmenopausal 02/08/2014  . Perennial allergic rhinitis 08/14/2015  . Peripheral arterial disease ([place]) 02/08/2014  . Primary localized  osteoarthrosis, pelvic region and thigh 11/09/2013  . Primary osteoarthritis of right hip 10/11/2014  . Right hip pain 03/14/2014  . Sacral back pain 08/21/2015  . SOB (shortness of breath) 04/07/2016  . 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 Type 2 diabetes mellitus with vascular disease ([place]) 02/08/2014  . ERRONEOUS ENCOUNTER--DISREGARD 10/09/2016  . 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 Renal artery stenosis ([place]) 04/10/2017   Resolved Ambulatory Problems    Diagnosis Date Noted  . No Resolved Ambulatory Problems   Past Medical History:  Diagnosis Date  . Abdominal aortic aneurysm (AAA) ([place]) 2000's  . Age related entropion, unspecified laterality   . Anxiety   . Anxiety and depression   . Bilateral renal artery stenosis ([place])   . Breast calcification, left   . Cancer ([place]) 10/20/2016  . Carotid artery thrombosis, bilateral   . Carotid stenosis   . Chronic bronchitis ([place])   . Chronic insomnia   . COPD (chronic obstructive pulmonary disease) ([place])   . Coronary artery disease   . CRI (chronic renal insufficiency), stage 3 (moderate) ([facility])   . Depression   . Diabetes mellitus without complication ([place])   . GERD (gastroesophageal reflux disease)   . Heart palpitations   . History of AAA (abdominal aortic aneurysm) repair   . History of colon polyps   . History of TIA (transient ischemic attack)   . Hypercholesterolemia   . Hypertension   . Hypothyroidism   . Migraine headache   . Osteoarthritis   . Osteoporosis   . Peripheral arterial disease ([place])   . Pulmonary scarring   . Renal disorder   . SOB (shortness of breath)   . Stenosis of left subclavian artery ([facility])   . Stroke ([place])   . Thyroid disease   . TIA (transient ischemic attack)   . Type  2 diabetes mellitus with vascular disease ([place])    Constitutional Exam  General appearance: Well nourished, well developed, and well hydrated. In no apparent acute  distress Vitals:   04/06/18 1243  BP: (!) 154/76  Pulse: 77  Resp: 18  Temp: 97.8 F (36.6 C)  TempSrc: Oral  SpO2: 98%  Weight: 177 lb (80.3 kg)  Height: 5' 4.25" (1.632 m)   BMI Assessment: Estimated body mass index is 30.15 kg/m as calculated from the following:   Height as of this encounter: 5' 4.25" (1.632 m).   Weight as of this encounter: 177 lb (80.3 kg).  BMI interpretation table: BMI level Category Range association with higher incidence of chronic pain  <18 kg/m2 Underweight   18.5-24.9 kg/m2 Ideal body weight   25-29.9 kg/m2 Overweight Increased incidence by 20%  30-34.9 kg/m2 Obese (Class I) Increased incidence by 68%  35-39.9 kg/m2 Severe obesity (Class II) Increased incidence by 136%  >40 kg/m2 Extreme obesity (Class III) Increased incidence by 254%   Patient's current BMI Ideal Body weight  Body mass index is 30.15 kg/m. Ideal body weight: 55.3 kg (121 lb 13.7 oz) Adjusted ideal body weight: 65.3 kg (143 lb 14.7 oz)   BMI Readings from Last 4 Encounters:  04/06/18 30.15 kg/m  06/15/17 28.49 kg/m  04/10/17 27.64 kg/m  10/21/16 28.76 kg/m   Wt Readings from Last 4 Encounters:  04/06/18 177 lb (80.3 kg)  06/15/17 166 lb (75.3 kg)  04/10/17 161 lb (73 kg)  10/21/16 167 lb 8.8 oz (76 kg)  Psych/Mental status: Alert, oriented x 3 (person, place, & time)       Eyes: PERLA Respiratory: No evidence of acute respiratory distress  Cervical Spine Area Exam  Skin & Axial Inspection: No masses, redness, edema, swelling, or associated skin lesions Alignment: Symmetrical Functional ROM: Unrestricted ROM      Stability: No instability detected Muscle Tone/Strength: Functionally intact. No obvious neuro-muscular anomalies detected. Sensory (Neurological): Unimpaired Palpation: No palpable anomalies              Upper Extremity (UE) Exam    Side: Right upper extremity  Side: Left upper extremity  Skin & Extremity Inspection: Skin color, temperature, and hair  growth are WNL. No peripheral edema or cyanosis. No masses, redness, swelling, asymmetry, or associated skin lesions. No contractures.  Skin & Extremity Inspection: Skin color, temperature, and hair growth are WNL. No peripheral edema or cyanosis. No masses, redness, swelling, asymmetry, or associated skin lesions. No contractures.  Functional ROM: Unrestricted ROM          Functional ROM: Unrestricted ROM          Muscle Tone/Strength: Functionally intact. No obvious neuro-muscular anomalies detected.  Muscle Tone/Strength: Functionally intact. No obvious neuro-muscular anomalies detected.  Sensory (Neurological): Unimpaired          Sensory (Neurological): Unimpaired          Palpation: No palpable anomalies              Palpation: No palpable anomalies              Provocative Test(s):  Phalen's test: deferred Tinel's test: deferred Apley's scratch test (touch opposite shoulder):  Action 1 (Across chest): deferred Action 2 (Overhead): deferred Action 3 (LB reach): deferred   Provocative Test(s):  Phalen's test: deferred Tinel's test: deferred Apley's scratch test (touch opposite shoulder):  Action 1 (Across chest): deferred Action 2 (Overhead): deferred Action 3 (LB reach): deferred    Thoracic Spine  Area Exam  Skin & Axial Inspection: No masses, redness, or swelling Alignment: Symmetrical Functional ROM: Unrestricted ROM Stability: No instability detected Muscle Tone/Strength: Functionally intact. No obvious neuro-muscular anomalies detected. Sensory (Neurological): Unimpaired Muscle strength & Tone: No palpable anomalies  Lumbar Spine Area Exam  Skin & Axial Inspection: No masses, redness, or swelling Alignment: Symmetrical Functional ROM: Decreased ROM affecting both sides Stability: No instability detected Muscle Tone/Strength: Functionally intact. No obvious neuro-muscular anomalies detected. Sensory (Neurological): Movement-associated discomfort Palpation: Complains of  area being tender to palpation       Provocative Tests: Hyperextension/rotation test: (+) bilaterally for facet joint pain. Lumbar quadrant test (Kemp's test): deferred today       Lateral bending test: (+) ipsilateral radicular pain, bilaterally. Positive for bilateral foraminal stenosis. Patrick's Maneuver: deferred today                   FABER test: deferred today                   S-I anterior distraction/compression test: deferred today         S-I lateral compression test: deferred today         S-I Thigh-thrust test: deferred today         S-I Gaenslen's test: deferred today           Gait & Posture Assessment  Ambulation: Patient ambulates using a cane Gait: Significantly limited. Dependent on assistive device to ambulate Posture: Difficulty standing up straight, due to pain   Lower Extremity Exam    Side: Right lower extremity  Side: Left lower extremity  Stability: No instability observed          Stability: No instability observed          Skin & Extremity Inspection: Skin color, temperature, and hair growth are WNL. No peripheral edema or cyanosis. No masses, redness, swelling, asymmetry, or associated skin lesions. No contractures.  Skin & Extremity Inspection: Skin color, temperature, and hair growth are WNL. No peripheral edema or cyanosis. No masses, redness, swelling, asymmetry, or associated skin lesions. No contractures.  Functional ROM: Unrestricted ROM                  Functional ROM: Unrestricted ROM                  Muscle Tone/Strength: Functionally intact. No obvious neuro-muscular anomalies detected.  Muscle Tone/Strength: Functionally intact. No obvious neuro-muscular anomalies detected.  Sensory (Neurological): Unimpaired  Sensory (Neurological): Unimpaired  Palpation: No palpable anomalies  Palpation: No palpable anomalies   Assessment  Primary Diagnosis & Pertinent Problem List: The primary encounter diagnosis was Lumbar radiculopathy. Diagnoses of Lumbar  degenerative disc disease, Chronic bilateral low back pain with bilateral sciatica, Chronic pain syndrome, and Pain of both hip joints (R>L) were also pertinent to this visit.  Visit Diagnosis (New problems to examiner): 1. Lumbar radiculopathy   2. Lumbar degenerative disc disease   3. Chronic bilateral low back pain with bilateral sciatica   4. Chronic pain syndrome   5. Pain of both hip joints (R>L)   General Recommendations: The pain condition that the patient suffers from is best treated with a multidisciplinary approach that involves an increase in physical activity to prevent de-conditioning and worsening of the pain cycle, as well as psychological counseling (formal and/or informal) to address the co-morbid psychological affects of pain. Treatment will often involve judicious use of pain medications and interventional procedures to decrease the pain,  allowing the patient to participate in the physical activity that will ultimately produce long-lasting pain reductions. The goal of the multidisciplinary approach is to return the patient to a higher level of overall function and to restore their ability to perform activities of daily living.  Patient is a very pleasant 77 year old female with a history of chronic kidney disease, COPD, bilateral renal artery stenosis, osteoarthritis, abdominal aortic aneurysm status post repair in 2010, depression who presents with axial low back pain that radiates down both of her legs and a posterior lateral distribution stopping usually at her calves.  This is likely secondary to lumbar radiculopathy, lumbar degenerative disc disease, lumbar facet arthropathy.  Given that the patient's primary pain complaint seems dermatomal in nature as her pain radiates down the back and side of her legs, we discussed L4-L5 epidural steroid injection.  Risks and benefits discussed and patient would like to proceed.  Patient instructed to decrease her 325 mg aspirin to 81 mg 7 days  prior to scheduled procedure.  Regards the patient's chronic hip pain and buttock pain, that is worse with weightbearing, I would like to obtain lumbar spine x-rays as well as bilateral hip and sacroiliac joint x-rays.  These will be discussed with the patient at her procedure visit.  Plan: -L4/5 ESI under fluoroscopy without sedation (patient instructed to stop aspirin 325 7 days prior to scheduled procedure and to take 81 mg instead) -X-rays of lumbar spine, bilateral hips, bilateral SI joints -Continue medications as prescribed including gabapentin  Future considerations:  Lumbar facet medial branch nerve blocks Bilateral SI joint injections  Note: Please be advised that as per protocol, today's visit has been an evaluation only. We have not taken over the patient's controlled substance management.  Problem-specific plan: No problem-specific Assessment & Plan notes found for this encounter.  Ordered Lab-work, Procedure(s), Referral(s), & Consult(s): Orders Placed This Encounter  Procedures  . Lumbar Epidural Injection  . DG Lumbar Spine Complete W/Bend  . DG HIP UNILAT W OR W/O PELVIS 2-3 VIEWS LEFT  . DG HIP UNILAT W OR W/O PELVIS 2-3 VIEWS RIGHT  . DG Si Joints   Interventional management options: Ms. Bulman was informed that there is no guarantee that she would be a candidate for interventional therapies. The decision will be based on the results of diagnostic studies, as well as Ms. Lenderman's risk profile.  Procedure(s) under consideration:  Lumbar epidural steroid injection Lumbar facet medial branch nerve block Bilateral SI joint injection Bilateral hip intra-articular injection   Provider-requested follow-up: Return in about 2 weeks (around 04/20/2018) for Procedure.  Future Appointments  Date Time Provider [place]  04/13/2018  7:30 AM [facility] V[facility] 1 [facility] None  04/13/2018  8:30 AM Dew, Erskine Squibb, MD [facility] None  04/23/2018  1:00 PM [facility] V[facility] 1 [facility] None   04/23/2018  2:00 PM Dew, Erskine Squibb, MD [facility] None    Primary Care Physician: Tamara Kayser, MD Location: [facility] Note by: Tamara Finley, M.D, Date: 04/06/2018; Time: 2:29 PM  Patient Instructions   Pt to stop 325 mg 7 days prior and take 81 mg instead Epidural Steroid Injection An epidural steroid injection is given to relieve pain in your neck, back, or legs that is caused by the irritation or swelling of a nerve root. This procedure involves injecting a steroid and numbing medicine (anesthetic) into the epidural space. The epidural space is the space between the outer covering of your spinal cord and the bones that  form your backbone (vertebra).  LET [facility] CARE PROVIDER KNOW ABOUT:   Any allergies you have.  All medicines you are taking, including vitamins, herbs, eye drops, creams, and over-the-counter medicines such as aspirin.  Previous problems you or members of your family have had with the use of anesthetics.  Any blood disorders or blood clotting disorders you have.  Previous surgeries you have had.  Medical conditions you have.  RISKS AND COMPLICATIONS Generally, this is a safe procedure. However, as with any procedure, complications can occur. Possible complications of epidural steroid injection include:  Headache.  Bleeding.  Infection.  Allergic reaction to the medicines.  Damage to your nerves. The response to this procedure depends on the underlying cause of the pain and its duration. People who have long-term (chronic) pain are less likely to benefit from epidural steroids than are those people whose pain comes on strong and suddenly.  BEFORE THE PROCEDURE   Ask your health care provider about changing or stopping your regular medicines. You may be advised to stop taking blood-thinning medicines a few days before the procedure.  You may be given medicines to reduce anxiety.  Arrange for someone to take you home  after the procedure.  PROCEDURE   You will remain awake during the procedure. You may receive medicine to make you relaxed.  You will be asked to lie on your stomach.  The injection site will be cleaned.  The injection site will be numbed with a medicine (local anesthetic).  A needle will be injected through your skin into the epidural space.  Your health care provider will use an X-ray machine to ensure that the steroid is delivered closest to the affected nerve. You may have minimal discomfort at this time.  Once the needle is in the right position, the local anesthetic and the steroid will be injected into the epidural space.  The needle will then be removed and a bandage will be applied to the injection site.  AFTER THE PROCEDURE   You may be monitored for a short time before you go home.  You may feel weakness or numbness in your arm or leg, which disappears within hours.  You may be allowed to eat, drink, and take your regular medicine.  You may have soreness at the site of the injection.   This information is not intended to replace advice given to you by your health care provider. Make sure you discuss any questions you have with your health care provider.   Document Released: 11/25/2007 Document Revised: 04/20/2013 Document Reviewed: 02/04/2013 Elsevier Interactive Patient Education 2016 [place]  What are the risk, side effects and possible complications? Generally speaking, most procedures are safe.  However, with any procedure there are risks, side effects, and the possibility of complications.  The risks and complications are dependent upon the sites that are lesioned, or the type of nerve block to be performed.  The closer the procedure is to the spine, the more serious the risks are.  Great care is taken when placing the radio frequency needles, block needles or lesioning probes, but sometimes complications can  occur. Infection: Any time there is an injection through the skin, there is a risk of infection.  This is why sterile conditions are used for these blocks. There are four possible types of infection: 1. Localized skin infection. 2. Central Nervous System Infection: This can be in the form of Meningitis, which can be deadly. 3. Epidural Infections:  This can be in the form of an epidural abscess, which can cause pressure inside of the spine, causing compression of the spinal cord with subsequent paralysis. This would require an emergency surgery to decompress, and there are no guarantees that the patient would recover from the paralysis. 4. Discitis: This is an infection of the intervertebral discs. It occurs in about 1% of discography procedures. It is difficult to treat and it may lead to surgery. Pain: the needles have to go through skin and soft tissues, will cause soreness. Damage to internal structures:  The nerves to be lesioned may be near blood vessels or other nerves which can be potentially damaged. Bleeding: Bleeding is more common if the patient is taking blood thinners such as  aspirin, Coumadin, Ticiid, Plavix, etc., or if he/she have some genetic predisposition such as hemophilia. Bleeding into the spinal canal can cause compression of the spinal  cord with subsequent paralysis.  This would require an emergency surgery to decompress and there are no guarantees that the patient would recover from the paralysis. Pneumothorax: Puncturing of a lung is a possibility, every time a needle is introduced in the area of the chest or upper back.  Pneumothorax refers to free air around the collapsed lung(s), inside of the thoracic cavity (chest cavity).  Another two possible complications related to a similar event would include: Hemothorax and Chylothorax. These are variations of the Pneumothorax, where instead of air around the collapsed lung(s), you may have blood or chyle, respectively. Spinal  headaches: They may occur with any procedures in the area of the spine. Persistent CSF (Cerebro-Spinal Fluid) leakage: This is a rare problem, but may occur with prolonged intrathecal or epidural catheters either due to the formation of a fistulous track or a dural tear. Nerve damage: By working so close to the spinal cord, there is always a possibility of nerve damage, which could be as serious as a permanent spinal cord injury with paralysis. Death: Although rare, severe deadly allergic reactions known as "Anaphylactic reaction" can occur to any of the medications used. Worsening of the symptoms: We can always make thing worse.  What are the chances of something like this happening? Chances of any of this occuring are extremely low.  By statistics, you have more of a chance of getting killed in a motor vehicle accident: while driving to the hospital than any of the above occurring .  Nevertheless, you should be aware that they are possibilities.  In general, it is similar to taking a shower.  Everybody knows that you can slip, hit your head and get killed.  Does that mean that you should not shower again?  Nevertheless always keep in mind that statistics do not mean anything if you happen to be on the wrong side of them.  Even if a procedure has a 1 (one) in a 1,000,000 (million) chance of going wrong, it you happen to be that one..Also, keep in mind that by statistics, you have more of a chance of having something go wrong when taking medications.  Who should not have this procedure? If you are on a blood thinning medication (e.g. Coumadin, Plavix, see list of "Blood Thinners"), or if you have an active infection going on, you should not have the procedure.  If you are taking any blood thinners, please inform your physician.  Preparing for your procedure: 1. Do not eat or drink anything at least eight (8) hours prior to the procedure. 2. Bring a driver with you .  It cannot be a taxi. 3. Come  accompanied by an adult that can drive you back, and that is strong enough to help you if your legs get weak or numb from the local anesthetic. 4. Take all of your medicines the morning of the procedure with just enough water to swallow them. 5. If you have diabetes, make sure that you are scheduled to have your procedure done first thing in the morning, whenever possible. 6. If you have diabetes, take only half of your insulin dose and notify our nurse that you have done so as soon as you arrive at the clinic. 7. If you are diabetic, but only take blood sugar pills (oral hypoglycemic), then do not take them on the morning of your procedure.  You may take them after you have had the procedure. 8. Do not take aspirin or any aspirin-containing medications, at least eleven (11) days prior to the procedure.  They may prolong bleeding. 9. Wear loose fitting clothing that may be easy to take off and that you would not mind if it got stained with Betadine or blood. 10. Do not wear any jewelry or perfume 11. Remove any nail coloring.  It will interfere with some of our monitoring equipment. 12. If you take Metformin for your diabetes, stop it 48 hours prior to the procedure.  NOTE: Remember that this is not meant to be interpreted as a complete list of all possible complications.  Unforeseen problems may occur.  BLOOD THINNERS The following drugs contain aspirin or other products, which can cause increased bleeding during surgery and should not be taken for 2 weeks prior to and 1 week after surgery.  If you should need take something for relief of minor pain, you may take acetaminophen which is found in Tylenol,m Datril, Anacin-3 and Panadol. It is not blood thinner. The products listed below are.  Do not take any of the products listed below in addition to any listed on your instruction sheet.  A.P.C or A.P.C with Codeine Codeine Phosphate Capsules #3 Ibuprofen Ridaura  ABC compound Congesprin Imuran  rimadil  Advil Cope Indocin Robaxisal  Alka-Seltzer Effervescent Pain Reliever and Antacid Coricidin or Coricidin-D  Indomethacin Rufen  Alka-Seltzer plus Cold Medicine Cosprin Ketoprofen S-A-C Tablets  Anacin Analgesic Tablets or Capsules Coumadin Korlgesic Salflex  Anacin Extra Strength Analgesic tablets or capsules CP-2 Tablets Lanoril Salicylate  Anaprox Cuprimine Capsules Levenox Salocol  Anexsia-D Dalteparin Magan Salsalate  Anodynos Darvon compound Magnesium Salicylate Sine-off  Ansaid Dasin Capsules Magsal Sodium Salicylate  Anturane Depen Capsules Marnal Soma  APF Arthritis pain formula Dewitt's Pills Measurin Stanback  Argesic Dia-Gesic Meclofenamic Sulfinpyrazone  Arthritis Bayer Timed Release Aspirin Diclofenac Meclomen Sulindac  Arthritis pain formula Anacin Dicumarol Medipren Supac  Analgesic (Safety coated) Arthralgen Diffunasal Mefanamic Suprofen  Arthritis Strength Bufferin Dihydrocodeine Mepro Compound Suprol  Arthropan liquid Dopirydamole Methcarbomol with Aspirin Synalgos  ASA tablets/Enseals Disalcid Micrainin Tagament  Ascriptin Doan's Midol Talwin  Ascriptin A/D Dolene Mobidin Tanderil  Ascriptin Extra Strength Dolobid Moblgesic Ticlid  Ascriptin with Codeine Doloprin or Doloprin with Codeine Momentum Tolectin  Asperbuf Duoprin Mono-gesic Trendar  Aspergum Duradyne Motrin or Motrin IB Triminicin  Aspirin plain, buffered or enteric coated Durasal Myochrisine Trigesic  Aspirin Suppositories Easprin Nalfon Trillsate  Aspirin with Codeine Ecotrin Regular or Extra Strength Naprosyn Uracel  Atromid-S Efficin Naproxen Ursinus  Auranofin Capsules Elmiron Neocylate Vanquish  Axotal Emagrin Norgesic Verin  Azathioprine Empirin or Empirin with Codeine Normiflo Vitamin E  Azolid Emprazil Nuprin Voltaren  Bayer Aspirin plain, buffered or children's or timed BC Tablets or powders Encaprin Orgaran Warfarin Sodium  Buff-a-Comp Enoxaparin Orudis Zorpin  Buff-a-Comp with  Codeine Equegesic Os-Cal-Gesic   Buffaprin Excedrin plain, buffered or Extra Strength Oxalid   Bufferin Arthritis Strength Feldene Oxphenbutazone   Bufferin plain or Extra Strength Feldene Capsules Oxycodone with Aspirin   Bufferin with Codeine Fenoprofen Fenoprofen Pabalate or Pabalate-SF   Buffets II Flogesic Panagesic   Buffinol plain or Extra Strength Florinal or Florinal with Codeine Panwarfarin   Buf-Tabs Flurbiprofen Penicillamine   Butalbital Compound Four-way cold tablets Penicillin   Butazolidin Fragmin Pepto-Bismol   Carbenicillin Geminisyn Percodan   Carna Arthritis Reliever Geopen Persantine   Carprofen Gold's salt Persistin   Chloramphenicol Goody's Phenylbutazone   Chloromycetin Haltrain Piroxlcam   Clmetidine heparin Plaquenil   Cllnoril Hyco-pap Ponstel   Clofibrate Hydroxy chloroquine Propoxyphen         Before stopping any of these medications, be sure to consult the physician who ordered them.  Some, such as Coumadin (Warfarin) are ordered to prevent or treat serious conditions such as "deep thrombosis", "pumonary embolisms", and other heart problems.  The amount of time that you may need off of the medication may also vary with the medication and the reason for which you were taking it.  If you are taking any of these medications, please make sure you notify your pain physician before you undergo any procedures.Preparing for your procedure (without sedation) Instructions: . Oral Intake: Do not eat or drink anything for at least 3 hours prior to your procedure. . Transportation: Unless otherwise stated by your physician, you may drive yourself after the procedure. . Blood Pressure Medicine: Take your blood pressure medicine with a sip of water the morning of the procedure. . Insulin: Take only  of your normal insulin dose. . Preventing infections: Shower with an antibacterial soap the morning of your procedure. . Build-up your immune system: Take 1000 mg of Vitamin C  with every meal (3 times a day) the day prior to your procedure. . Pregnancy: If you are pregnant, call and cancel the procedure. . Sickness: If you have a cold, fever, or any active infections, call and cancel the procedure. . Arrival: You must be in the facility at least 30 minutes prior to your scheduled procedure. . Children: Do not bring any children with you. . Dress appropriately: Bring dark clothing that you would not mind if they get stained. . Valuables: Do not bring any jewelry or valuables. Procedure appointments are reserved for interventional treatments only. Marland Kitchen No Prescription Refills. . No medication changes will be discussed during procedure appointments. No disability issues will be discussed.

## 2018-04-06 NOTE — Patient Instructions (Addendum)
Pt to stop 325 mg 7 days prior and take 81 mg instead Epidural Steroid Injection An epidural steroid injection is given to relieve pain in your neck, back, or legs that is caused by the irritation or swelling of a nerve root. This procedure involves injecting a steroid and numbing medicine (anesthetic) into the epidural space. The epidural space is the space between the outer covering of your spinal cord and the bones that form your backbone (vertebra).  LET [facility] CARE PROVIDER KNOW ABOUT:   Any allergies you have.  All medicines you are taking, including vitamins, herbs, eye drops, creams, and over-the-counter medicines such as aspirin.  Previous problems you or members of your family have had with the use of anesthetics.  Any blood disorders or blood clotting disorders you have.  Previous surgeries you have had.  Medical conditions you have.  RISKS AND COMPLICATIONS Generally, this is a safe procedure. However, as with any procedure, complications can occur. Possible complications of epidural steroid injection include:  Headache.  Bleeding.  Infection.  Allergic reaction to the medicines.  Damage to your nerves. The response to this procedure depends on the underlying cause of the pain and its duration. People who have long-term (chronic) pain are less likely to benefit from epidural steroids than are those people whose pain comes on strong and suddenly.  BEFORE THE PROCEDURE   Ask your health care provider about changing or stopping your regular medicines. You may be advised to stop taking blood-thinning medicines a few days before the procedure.  You may be given medicines to reduce anxiety.  Arrange for someone to take you home after the procedure.  PROCEDURE   You will remain awake during the procedure. You may receive medicine to make you relaxed.  You will be asked to lie on your stomach.  The injection site will be cleaned.  The injection site will be  numbed with a medicine (local anesthetic).  A needle will be injected through your skin into the epidural space.  Your health care provider will use an X-ray machine to ensure that the steroid is delivered closest to the affected nerve. You may have minimal discomfort at this time.  Once the needle is in the right position, the local anesthetic and the steroid will be injected into the epidural space.  The needle will then be removed and a bandage will be applied to the injection site.  AFTER THE PROCEDURE   You may be monitored for a short time before you go home.  You may feel weakness or numbness in your arm or leg, which disappears within hours.  You may be allowed to eat, drink, and take your regular medicine.  You may have soreness at the site of the injection.   This information is not intended to replace advice given to you by your health care provider. Make sure you discuss any questions you have with your health care provider.   Document Released: 11/25/2007 Document Revised: 04/20/2013 Document Reviewed: 02/04/2013 Elsevier Interactive Patient Education 2016 [place]  What are the risk, side effects and possible complications? Generally speaking, most procedures are safe.  However, with any procedure there are risks, side effects, and the possibility of complications.  The risks and complications are dependent upon the sites that are lesioned, or the type of nerve block to be performed.  The closer the procedure is to the spine, the more serious the risks are.  Great care is taken  when placing the radio frequency needles, block needles or lesioning probes, but sometimes complications can occur. Infection: Any time there is an injection through the skin, there is a risk of infection.  This is why sterile conditions are used for these blocks. There are four possible types of infection: 1. Localized skin infection. 2. Central Nervous  System Infection: This can be in the form of Meningitis, which can be deadly. 3. Epidural Infections: This can be in the form of an epidural abscess, which can cause pressure inside of the spine, causing compression of the spinal cord with subsequent paralysis. This would require an emergency surgery to decompress, and there are no guarantees that the patient would recover from the paralysis. 4. Discitis: This is an infection of the intervertebral discs. It occurs in about 1% of discography procedures. It is difficult to treat and it may lead to surgery. Pain: the needles have to go through skin and soft tissues, will cause soreness. Damage to internal structures:  The nerves to be lesioned may be near blood vessels or other nerves which can be potentially damaged. Bleeding: Bleeding is more common if the patient is taking blood thinners such as  aspirin, Coumadin, Ticiid, Plavix, etc., or if he/she have some genetic predisposition such as hemophilia. Bleeding into the spinal canal can cause compression of the spinal  cord with subsequent paralysis.  This would require an emergency surgery to decompress and there are no guarantees that the patient would recover from the paralysis. Pneumothorax: Puncturing of a lung is a possibility, every time a needle is introduced in the area of the chest or upper back.  Pneumothorax refers to free air around the collapsed lung(s), inside of the thoracic cavity (chest cavity).  Another two possible complications related to a similar event would include: Hemothorax and Chylothorax. These are variations of the Pneumothorax, where instead of air around the collapsed lung(s), you may have blood or chyle, respectively. Spinal headaches: They may occur with any procedures in the area of the spine. Persistent CSF (Cerebro-Spinal Fluid) leakage: This is a rare problem, but may occur with prolonged intrathecal or epidural catheters either due to the formation of a fistulous track  or a dural tear. Nerve damage: By working so close to the spinal cord, there is always a possibility of nerve damage, which could be as serious as a permanent spinal cord injury with paralysis. Death: Although rare, severe deadly allergic reactions known as "Anaphylactic reaction" can occur to any of the medications used. Worsening of the symptoms: We can always make thing worse.  What are the chances of something like this happening? Chances of any of this occuring are extremely low.  By statistics, you have more of a chance of getting killed in a motor vehicle accident: while driving to the hospital than any of the above occurring .  Nevertheless, you should be aware that they are possibilities.  In general, it is similar to taking a shower.  Everybody knows that you can slip, hit your head and get killed.  Does that mean that you should not shower again?  Nevertheless always keep in mind that statistics do not mean anything if you happen to be on the wrong side of them.  Even if a procedure has a 1 (one) in a 1,000,000 (million) chance of going wrong, it you happen to be that one..Also, keep in mind that by statistics, you have more of a chance of having something go wrong when taking medications.  Who  should not have this procedure? If you are on a blood thinning medication (e.g. Coumadin, Plavix, see list of "Blood Thinners"), or if you have an active infection going on, you should not have the procedure.  If you are taking any blood thinners, please inform your physician.  Preparing for your procedure: 1. Do not eat or drink anything at least eight (8) hours prior to the procedure. 2. Bring a driver with you .  It cannot be a taxi. 3. Come accompanied by an adult that can drive you back, and that is strong enough to help you if your legs get weak or numb from the local anesthetic. 4. Take all of your medicines the morning of the procedure with just enough water to swallow them. 5. If you have  diabetes, make sure that you are scheduled to have your procedure done first thing in the morning, whenever possible. 6. If you have diabetes, take only half of your insulin dose and notify our nurse that you have done so as soon as you arrive at the clinic. 7. If you are diabetic, but only take blood sugar pills (oral hypoglycemic), then do not take them on the morning of your procedure.  You may take them after you have had the procedure. 8. Do not take aspirin or any aspirin-containing medications, at least eleven (11) days prior to the procedure.  They may prolong bleeding. 9. Wear loose fitting clothing that may be easy to take off and that you would not mind if it got stained with Betadine or blood. 10. Do not wear any jewelry or perfume 11. Remove any nail coloring.  It will interfere with some of our monitoring equipment. 12. If you take Metformin for your diabetes, stop it 48 hours prior to the procedure.  NOTE: Remember that this is not meant to be interpreted as a complete list of all possible complications.  Unforeseen problems may occur.  BLOOD THINNERS The following drugs contain aspirin or other products, which can cause increased bleeding during surgery and should not be taken for 2 weeks prior to and 1 week after surgery.  If you should need take something for relief of minor pain, you may take acetaminophen which is found in Tylenol,m Datril, Anacin-3 and Panadol. It is not blood thinner. The products listed below are.  Do not take any of the products listed below in addition to any listed on your instruction sheet.  A.P.C or A.P.C with Codeine Codeine Phosphate Capsules #3 Ibuprofen Ridaura  ABC compound Congesprin Imuran rimadil  Advil Cope Indocin Robaxisal  Alka-Seltzer Effervescent Pain Reliever and Antacid Coricidin or Coricidin-D  Indomethacin Rufen  Alka-Seltzer plus Cold Medicine Cosprin Ketoprofen S-A-C Tablets  Anacin Analgesic Tablets or Capsules Coumadin Korlgesic  Salflex  Anacin Extra Strength Analgesic tablets or capsules CP-2 Tablets Lanoril Salicylate  Anaprox Cuprimine Capsules Levenox Salocol  Anexsia-D Dalteparin Magan Salsalate  Anodynos Darvon compound Magnesium Salicylate Sine-off  Ansaid Dasin Capsules Magsal Sodium Salicylate  Anturane Depen Capsules Marnal Soma  APF Arthritis pain formula Dewitt's Pills Measurin Stanback  Argesic Dia-Gesic Meclofenamic Sulfinpyrazone  Arthritis Bayer Timed Release Aspirin Diclofenac Meclomen Sulindac  Arthritis pain formula Anacin Dicumarol Medipren Supac  Analgesic (Safety coated) Arthralgen Diffunasal Mefanamic Suprofen  Arthritis Strength Bufferin Dihydrocodeine Mepro Compound Suprol  Arthropan liquid Dopirydamole Methcarbomol with Aspirin Synalgos  ASA tablets/Enseals Disalcid Micrainin Tagament  Ascriptin Doan's Midol Talwin  Ascriptin A/D Dolene Mobidin Tanderil  Ascriptin Extra Strength Dolobid Moblgesic Ticlid  Ascriptin with Codeine Doloprin or  Doloprin with Codeine Momentum Tolectin  Asperbuf Duoprin Mono-gesic Trendar  Aspergum Duradyne Motrin or Motrin IB Triminicin  Aspirin plain, buffered or enteric coated Durasal Myochrisine Trigesic  Aspirin Suppositories Easprin Nalfon Trillsate  Aspirin with Codeine Ecotrin Regular or Extra Strength Naprosyn Uracel  Atromid-S Efficin Naproxen Ursinus  Auranofin Capsules Elmiron Neocylate Vanquish  Axotal Emagrin Norgesic Verin  Azathioprine Empirin or Empirin with Codeine Normiflo Vitamin E  Azolid Emprazil Nuprin Voltaren  Bayer Aspirin plain, buffered or children's or timed BC Tablets or powders Encaprin Orgaran Warfarin Sodium  Buff-a-Comp Enoxaparin Orudis Zorpin  Buff-a-Comp with Codeine Equegesic Os-Cal-Gesic   Buffaprin Excedrin plain, buffered or Extra Strength Oxalid   Bufferin Arthritis Strength Feldene Oxphenbutazone   Bufferin plain or Extra Strength Feldene Capsules Oxycodone with Aspirin   Bufferin with Codeine Fenoprofen  Fenoprofen Pabalate or Pabalate-SF   Buffets II Flogesic Panagesic   Buffinol plain or Extra Strength Florinal or Florinal with Codeine Panwarfarin   Buf-Tabs Flurbiprofen Penicillamine   Butalbital Compound Four-way cold tablets Penicillin   Butazolidin Fragmin Pepto-Bismol   Carbenicillin Geminisyn Percodan   Carna Arthritis Reliever Geopen Persantine   Carprofen Gold's salt Persistin   Chloramphenicol Goody's Phenylbutazone   Chloromycetin Haltrain Piroxlcam   Clmetidine heparin Plaquenil   Cllnoril Hyco-pap Ponstel   Clofibrate Hydroxy chloroquine Propoxyphen         Before stopping any of these medications, be sure to consult the physician who ordered them.  Some, such as Coumadin (Warfarin) are ordered to prevent or treat serious conditions such as "deep thrombosis", "pumonary embolisms", and other heart problems.  The amount of time that you may need off of the medication may also vary with the medication and the reason for which you were taking it.  If you are taking any of these medications, please make sure you notify your pain physician before you undergo any procedures.Preparing for your procedure (without sedation) Instructions: . Oral Intake: Do not eat or drink anything for at least 3 hours prior to your procedure. . Transportation: Unless otherwise stated by your physician, you may drive yourself after the procedure. . Blood Pressure Medicine: Take your blood pressure medicine with a sip of water the morning of the procedure. . Insulin: Take only  of your normal insulin dose. . Preventing infections: Shower with an antibacterial soap the morning of your procedure. . Build-up your immune system: Take 1000 mg of Vitamin C with every meal (3 times a day) the day prior to your procedure. . Pregnancy: If you are pregnant, call and cancel the procedure. . Sickness: If you have a cold, fever, or any active infections, call and cancel the procedure. . Arrival: You must be in the  facility at least 30 minutes prior to your scheduled procedure. . Children: Do not bring any children with you. . Dress appropriately: Bring dark clothing that you would not mind if they get stained. . Valuables: Do not bring any jewelry or valuables. Procedure appointments are reserved for interventional treatments only. Marland Kitchen No Prescription Refills. . No medication changes will be discussed during procedure appointments. No disability issues will be discussed.

## 2018-04-06 NOTE — Progress Notes (Signed)
Safety precautions to be maintained throughout the outpatient stay will include: orient to surroundings, keep bed in low position, maintain call bell within reach at all times, provide assistance with transfer out of bed and ambulation.  

## 2018-04-13 ENCOUNTER — Ambulatory Visit (INDEPENDENT_AMBULATORY_CARE_PROVIDER_SITE_OTHER): Payer: Medicare HMO | Admitting: Vascular Surgery

## 2018-04-13 ENCOUNTER — Encounter (INDEPENDENT_AMBULATORY_CARE_PROVIDER_SITE_OTHER): Payer: Self-pay | Admitting: Vascular Surgery

## 2018-04-13 ENCOUNTER — Ambulatory Visit (INDEPENDENT_AMBULATORY_CARE_PROVIDER_SITE_OTHER): Payer: Medicare HMO

## 2018-04-13 VITALS — BP 181/81 | HR 80 | Resp 16 | Ht 64.0 in | Wt 179.0 lb

## 2018-04-13 DIAGNOSIS — E119 Type 2 diabetes mellitus without complications: Secondary | ICD-10-CM

## 2018-04-13 DIAGNOSIS — I150 Renovascular hypertension: Secondary | ICD-10-CM

## 2018-04-13 DIAGNOSIS — I701 Atherosclerosis of renal artery: Secondary | ICD-10-CM | POA: Diagnosis not present

## 2018-04-13 DIAGNOSIS — Z9889 Other specified postprocedural states: Secondary | ICD-10-CM | POA: Diagnosis not present

## 2018-04-13 DIAGNOSIS — E785 Hyperlipidemia, unspecified: Secondary | ICD-10-CM | POA: Diagnosis not present

## 2018-04-13 DIAGNOSIS — E1159 Type 2 diabetes mellitus with other circulatory complications: Secondary | ICD-10-CM

## 2018-04-13 NOTE — Assessment & Plan Note (Signed)
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

## 2018-04-13 NOTE — Assessment & Plan Note (Signed)
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

## 2018-04-13 NOTE — Progress Notes (Signed)
MRN : [id number]  Tamara Finley is a 77 y.o. ([birth year]) female who presents with chief complaint of  Chief Complaint  Patient presents with  . Follow-up    ultrasound  .  History of Present Illness: Patient returns today in follow up of renal artery stenosis.  She saw her nephrologist yesterday but does not know the results of her renal function labs.  She says her blood pressure control has been good.  She denies any new symptoms.  Duplex today shows an atrophic right kidney which is stable.  Her left renal artery stent has normal velocities in the left kidney remains greater than 11 cm in size.  Current Outpatient Medications  Medication Sig Dispense Refill  . acetaminophen (TYLENOL) 500 MG tablet Take by mouth.    Marland Kitchen albuterol (PROVENTIL HFA;VENTOLIN HFA) 108 (90 BASE) MCG/ACT inhaler Inhale 2 puffs into the lungs every 6 (six) hours as needed for wheezing or shortness of breath. Reported on 12/31/2015    . anastrozole (ARIMIDEX) 1 MG tablet Take by mouth.    . Ascorbic Acid (VITAMIN C) POWD Take by mouth.    Marland Kitchen aspirin 81 MG chewable tablet Chew 81 mg by mouth daily.    Marland Kitchen atorvastatin (LIPITOR) 40 MG tablet Take by mouth.    . Calcium Carbonate-Vitamin D (CALCIUM 600+D) 600-400 MG-UNIT tablet Take 1 tablet by mouth 2 (two) times daily with a meal.    . cholecalciferol (VITAMIN D) 1000 units tablet Take 2,000 Units by mouth daily. Reported on 12/31/2015    . fluticasone (FLONASE) 50 MCG/ACT nasal spray Place 1-2 sprays into both nostrils daily as needed for rhinitis.    . furosemide (LASIX) 40 MG tablet Take 1 tablet by mouth 1 day or 1 dose.    . gabapentin (NEURONTIN) 300 MG capsule Take 300 mg by mouth 3 (three) times daily.    . hydrALAZINE (APRESOLINE) 25 MG tablet Take 25 mg by mouth 3 (three) times daily.    Marland Kitchen levothyroxine (SYNTHROID, LEVOTHROID) 88 MCG tablet Take 88 mcg by mouth daily before breakfast.    . mirtazapine (REMERON) 15 MG tablet TAKE 1 TABLET BY MOUTH EVERY DAY  AT NIGHT  3  . pantoprazole (PROTONIX) 40 MG tablet Take 40 mg by mouth daily.    . polyethylene glycol powder (GLYCOLAX/MIRALAX) powder Take by mouth.    . potassium chloride SA (K-DUR,KLOR-CON) 20 MEQ tablet Take 20 mEq by mouth as directed.    Marland Kitchen amLODipine (NORVASC) 5 MG tablet Take 5 mg by mouth daily.    . clopidogrel (PLAVIX) 75 MG tablet Take 75 mg by mouth daily.    . felodipine (PLENDIL) 10 MG 24 hr tablet Take 10 mg by mouth daily. Reported on 12/31/2015    . gabapentin (NEURONTIN) 300 MG capsule Take 1 capsule by mouth 1 day or 1 dose.    Marland Kitchen GAVILYTE-N WITH FLAVOR PACK 420 g solution     . hydrochlorothiazide (HYDRODIURIL) 25 MG tablet Take 25 mg by mouth daily.    . magnesium 30 MG tablet Take 250 mg by mouth daily.    . metoprolol succinate (TOPROL-XL) 50 MG 24 hr tablet Take 50 mg by mouth daily. Reported on 12/31/2015    . omega-3 acid ethyl esters (LOVAZA) 1 g capsule Take 1 g by mouth daily.    Marland Kitchen oxyCODONE (OXY IR/ROXICODONE) 5 MG immediate release tablet Take by mouth.    . PEG 3350-KCl-NaBcb-NaCl-NaSulf (PEG-3350/ELECTROLYTES PO) Take 4,000 mLs by mouth as directed.    Marland Kitchen  simvastatin (ZOCOR) 80 MG tablet Take 80 mg by mouth at bedtime. Reported on 12/31/2015    . sodium chloride (OCEAN) 0.65 % SOLN nasal spray Place 1-2 sprays into both nostrils as needed for congestion.    . traMADol (ULTRAM) 50 MG tablet Take 50 mg by mouth every 6 (six) hours as needed for moderate pain.     . vitamin B-12 (CYANOCOBALAMIN) 1000 MCG tablet Take 1 tablet by mouth 1 day or 1 dose.     No current facility-administered medications for this visit.     Past Medical History:  Diagnosis Date  . Abdominal aortic aneurysm (AAA) ([place]) 2000's  . Age related entropion, unspecified laterality   . Anxiety   . Anxiety and depression   . Bilateral renal artery stenosis ([place])   . Breast calcification, left   . Cancer ([place]) 10/20/2016   breast cancer  . Carotid artery thrombosis, bilateral   . Carotid  stenosis   . Chronic bronchitis ([place])   . Chronic insomnia   . COPD (chronic obstructive pulmonary disease) ([place])   . Coronary artery disease   . CRI (chronic renal insufficiency), stage 3 (moderate) (HCC)   . Depression   . Diabetes mellitus without complication ([place])   . GERD (gastroesophageal reflux disease)   . Heart palpitations   . History of AAA (abdominal aortic aneurysm) repair   . History of colon polyps   . History of TIA (transient ischemic attack)   . Hypercholesterolemia   . Hypertension   . Hypothyroidism   . Migraine headache   . Osteoarthritis   . Osteoporosis   . Peripheral arterial disease ([place])   . Pulmonary scarring   . Renal disorder   . SOB (shortness of breath)   . Stenosis of left subclavian artery (HCC)   . Stroke ([place])   . Thyroid disease   . TIA (transient ischemic attack)   . Type 2 diabetes mellitus with vascular disease [facility])     Past Surgical History:  Procedure Laterality Date  . ABDOMINAL AORTIC ANEURYSM REPAIR    . BACK SURGERY    . BREAST BIOPSY Left 09/03/2016   benign  . BREAST BIOPSY Left 09/25/2016   path pending  . BREAST EXCISIONAL BIOPSY Left 2010  . BREAST EXCISIONAL BIOPSY Left 07/01/2006  . BRONCHOSCOPY    . CARDIAC SURGERY    . CAROTID PTA/STENT INTERVENTION    . COLONOSCOPY    . COLONOSCOPY WITH PROPOFOL N/A 06/15/2017   Procedure: COLONOSCOPY WITH PROPOFOL;  Surgeon: Lollie Sails, MD;  Location: [facility] ENDOSCOPY;  Service: Endoscopy;  Laterality: N/A;  . EYE SURGERY    . LAMINECTOMY  07/25/2013  . MASTECTOMY    . MEDIASTINOSCOPY    . PERIPHERAL VASCULAR CATHETERIZATION N/A 12/05/2015   Procedure: Renal Angiography;  Surgeon: Katha Cabal, MD;  Location: [facility] CV LAB;  Service: Cardiovascular;  Laterality: N/A;  . PERIPHERAL VASCULAR CATHETERIZATION Left 12/31/2015   Procedure: Carotid PTA/Stent Intervention;  Surgeon: Algernon Huxley, MD;  Location: [facility];  Service: Cardiovascular;   Laterality: Left;  . THORACOSCOPY  12/16/2016    Social History  Substance Use Topics  . Smoking status: Former Smoker    Packs/day: 2.00    Years: 50.00    Types: Cigarettes    Quit date: 06/01/2013  . Smokeless tobacco: Never Used  . Alcohol use No    Family History      Family History  Problem Relation Age of Onset  .  CVA Mother   . Hypertension Mother   . Stroke Mother   . Epilepsy Mother   . Dementia Father   . Stroke Father   . Coronary artery disease Father   . Hypertension Father   . Ovarian cancer Sister   . Cancer Brother   . Diabetes Brother          Allergies  Allergen Reactions  . Benadryl [Diphenhydramine Hcl (Sleep)] Other (See Comments)    BAD DREAMS  . Diphenhydramine Hcl Other (See Comments)    Makes her "hyper"  . Alprazolam Rash    "bad dreams"     REVIEW OF SYSTEMS (Negative unless checked)  Constitutional: [] Weight loss  [] Fever  [] Chills Cardiac: [] Chest pain   [] Chest pressure   [] Palpitations   [] Shortness of breath when laying flat   [] Shortness of breath at rest   [x] Shortness of breath with exertion. Vascular:  [] Pain in legs with walking   [] Pain in legs at rest   [] Pain in legs when laying flat   [] Claudication   [] Pain in feet when walking  [] Pain in feet at rest  [] Pain in feet when laying flat   [] History of DVT   [] Phlebitis   [x] Swelling in legs   [] Varicose veins   [] Non-healing ulcers Pulmonary:   [] Uses home oxygen   [] Productive cough   [] Hemoptysis   [] Wheeze  [x] COPD   [] Asthma Neurologic:  [] Dizziness  [] Blackouts   [] Seizures   [] History of stroke   [x] History of TIA  [] Aphasia   [] Temporary blindness   [] Dysphagia   [] Weakness or numbness in arms   [] Weakness or numbness in legs Musculoskeletal:  [x] Arthritis   [] Joint swelling   [] Joint pain   [] Low back pain Hematologic:  [] Easy bruising  [] Easy bleeding   [] Hypercoagulable state   [] Anemic   Gastrointestinal:  [] Blood in stool    [] Vomiting blood  [] Gastroesophageal reflux/heartburn   [] Abdominal pain Genitourinary:  [x] Chronic kidney disease   [] Difficult urination  [] Frequent urination  [] Burning with urination   [] Hematuria Skin:  [] Rashes   [] Ulcers   [] Wounds Psychological:  [] History of anxiety   []  History of major depression.   Physical Examination  BP (!) 181/81   Pulse 80   Resp 16   Ht 5\' 4"  (1.626 m)   Wt 179 lb (81.2 kg)   BMI 30.73 kg/m  Gen:  WD/WN, NAD Head: [place]/AT, No temporalis wasting. Ear/Nose/Throat: Hearing grossly intact, nares w/o erythema or drainage Eyes: Conjunctiva clear. Sclera non-icteric Neck: Supple.  Trachea midline Pulmonary:  Good air movement, no use of accessory muscles.  Cardiac: RRR, no JVD Vascular:  Vessel Right Left  Radial Palpable Palpable                                   Gastrointestinal: soft, non-tender/non-distended.  Musculoskeletal: M/S 5/5 throughout.  No deformity or atrophy.  1+ bilateral lower extremity edema.  Walking with a cane Neurologic: Sensation grossly intact in extremities.  Symmetrical.  Speech is fluent.  Psychiatric: Judgment intact, Mood & affect appropriate for pt's clinical situation. Dermatologic: No rashes or ulcers noted.  No cellulitis or open wounds.       Labs No results found for this or any previous visit (from the past 2160 hour(s)).  Radiology No results found.  Assessment/Plan  Type 2 diabetes mellitus with vascular disease (HCC) blood glucose control important in reducing the progression of atherosclerotic  disease. Also, involved in wound healing. On appropriate medications.   Hyperlipidemia, unspecified lipid control important in reducing the progression of atherosclerotic disease. Continue statin therapy  History of AAA (abdominal aortic aneurysm) repair Aortobiiliac graft appears patent by duplex  Renovascular hypertension, malignant Blood pressure control is good after left renal artery stent  placement a few years ago  Renal artery stenosis (HCC) Duplex today shows an atrophic right kidney which is stable.  Her left renal artery stent has normal velocities in the left kidney remains greater than 11 cm in size. Continue current medical regimen.  Follow-up with her nephrologist for renal function evaluation.  Recheck in 1 year    Leotis Pain, MD  04/13/2018 9:20 AM    This note was created with Dragon medical transcription system.  Any errors from dictation are purely unintentional

## 2018-04-13 NOTE — Assessment & Plan Note (Signed)
Duplex today shows an atrophic right kidney which is stable.  Her left renal artery stent has normal velocities in the left kidney remains greater than 11 cm in size. Continue current medical regimen.  Follow-up with her nephrologist for renal function evaluation.  Recheck in 1 year

## 2018-04-15 ENCOUNTER — Ambulatory Visit: Payer: Medicare HMO | Admitting: Student in an Organized Health Care Education/Training Program

## 2018-04-23 ENCOUNTER — Ambulatory Visit (INDEPENDENT_AMBULATORY_CARE_PROVIDER_SITE_OTHER): Payer: Medicare HMO | Admitting: Vascular Surgery

## 2018-04-23 ENCOUNTER — Encounter (INDEPENDENT_AMBULATORY_CARE_PROVIDER_SITE_OTHER): Payer: Medicare HMO

## 2018-04-26 ENCOUNTER — Ambulatory Visit (HOSPITAL_BASED_OUTPATIENT_CLINIC_OR_DEPARTMENT_OTHER): Payer: Medicare HMO | Admitting: Student in an Organized Health Care Education/Training Program

## 2018-04-26 ENCOUNTER — Ambulatory Visit
Admission: RE | Admit: 2018-04-26 | Discharge: 2018-04-26 | Disposition: A | Discharge status: Home / Self Care | Payer: Medicare HMO | Source: Ambulatory Visit | Attending: Student in an Organized Health Care Education/Training Program | Admitting: Student in an Organized Health Care Education/Training Program

## 2018-04-26 ENCOUNTER — Encounter: Payer: Self-pay | Admitting: Student in an Organized Health Care Education/Training Program

## 2018-04-26 DIAGNOSIS — Z79890 Hormone replacement therapy (postmenopausal): Secondary | ICD-10-CM | POA: Diagnosis not present

## 2018-04-26 DIAGNOSIS — Z79899 Other long term (current) drug therapy: Secondary | ICD-10-CM | POA: Diagnosis not present

## 2018-04-26 DIAGNOSIS — Z888 Allergy status to other drugs, medicaments and biological substances status: Secondary | ICD-10-CM | POA: Diagnosis not present

## 2018-04-26 DIAGNOSIS — Z7982 Long term (current) use of aspirin: Secondary | ICD-10-CM | POA: Diagnosis not present

## 2018-04-26 DIAGNOSIS — M5416 Radiculopathy, lumbar region: Secondary | ICD-10-CM

## 2018-04-26 DIAGNOSIS — Z7902 Long term (current) use of antithrombotics/antiplatelets: Secondary | ICD-10-CM | POA: Diagnosis not present

## 2018-04-26 DIAGNOSIS — Z8679 Personal history of other diseases of the circulatory system: Secondary | ICD-10-CM | POA: Diagnosis not present

## 2018-04-26 DIAGNOSIS — Z79891 Long term (current) use of opiate analgesic: Secondary | ICD-10-CM | POA: Insufficient documentation

## 2018-04-26 DIAGNOSIS — Z7951 Long term (current) use of inhaled steroids: Secondary | ICD-10-CM | POA: Diagnosis not present

## 2018-04-26 DIAGNOSIS — M545 Low back pain: Secondary | ICD-10-CM | POA: Diagnosis present

## 2018-04-26 DIAGNOSIS — Z79811 Long term (current) use of aromatase inhibitors: Secondary | ICD-10-CM | POA: Insufficient documentation

## 2018-04-26 MED ORDER — LIDOCAINE HCL 2 % IJ SOLN
INTRAMUSCULAR | Status: AC
  Filled 2018-04-26: qty 20

## 2018-04-26 MED ORDER — SODIUM CHLORIDE 0.9% FLUSH
2.0000 mL | Freq: Once | INTRAVENOUS | Status: AC
  Administered 2018-04-26: 2 mL

## 2018-04-26 MED ORDER — DEXAMETHASONE SODIUM PHOSPHATE 10 MG/ML IJ SOLN
INTRAMUSCULAR | Status: AC
  Filled 2018-04-26: qty 1

## 2018-04-26 MED ORDER — LIDOCAINE HCL 2 % IJ SOLN
10.0000 mL | Freq: Once | INTRAMUSCULAR | Status: AC
  Administered 2018-04-26: 200 mg

## 2018-04-26 MED ORDER — DEXAMETHASONE SODIUM PHOSPHATE 10 MG/ML IJ SOLN
10.0000 mg | Freq: Once | INTRAMUSCULAR | Status: AC
  Administered 2018-04-26: 10 mg

## 2018-04-26 MED ORDER — FENTANYL CITRATE (PF) 100 MCG/2ML IJ SOLN
INTRAMUSCULAR | Status: AC
  Filled 2018-04-26: qty 2

## 2018-04-26 MED ORDER — ROPIVACAINE HCL 2 MG/ML IJ SOLN
INTRAMUSCULAR | Status: AC
  Filled 2018-04-26: qty 10

## 2018-04-26 MED ORDER — SODIUM CHLORIDE 0.9 % IJ SOLN
INTRAMUSCULAR | Status: AC
  Filled 2018-04-26: qty 10

## 2018-04-26 MED ORDER — IOPAMIDOL (ISOVUE-M 200) INJECTION 41%
INTRAMUSCULAR | Status: AC
  Filled 2018-04-26: qty 10

## 2018-04-26 MED ORDER — IOPAMIDOL (ISOVUE-M 200) INJECTION 41%
10.0000 mL | Freq: Once | INTRAMUSCULAR | Status: AC
  Administered 2018-04-26: 10 mL via EPIDURAL

## 2018-04-26 MED ORDER — ROPIVACAINE HCL 2 MG/ML IJ SOLN: 2.0000 mL | Freq: Once | INTRAMUSCULAR | Status: DC

## 2018-04-26 NOTE — Progress Notes (Signed)
Safety precautions to be maintained throughout the outpatient stay will include: orient to surroundings, keep bed in low position, maintain call bell within reach at all times, provide assistance with transfer out of bed and ambulation.  

## 2018-04-26 NOTE — Progress Notes (Signed)
Patient's Name: Tamara Finley  MRN: [id number]  Referring Provider: Gillis Santa, MD  DOB: Jun 24, 1941  PCP: Ezequiel Kayser, MD  DOS: 04/26/2018  Note by: Gillis Santa, MD  Service setting: Ambulatory outpatient  Specialty: Interventional Pain Management  Patient type: Established  Location: [facility] (AMB) Pain Management Facility  Visit type: Interventional Procedure   Primary Reason for Visit: Interventional Pain Management Treatment. CC: Back Pain (lower bilateral)  Procedure:          Anesthesia, Analgesia, Anxiolysis:  Type: Diagnostic Epidural Steroid Injection Region: Caudal Level: Sacrococcygeal   Laterality: Midline       Type: Local Anesthesia Indication(s): Analgesia         Route: Infiltration ([place]/[facility]) IV Access: Declined Sedation: Declined  Local Anesthetic: Lidocaine 1-2%   Indications: 1. Lumbar radiculopathy    Pain Score: Pre-procedure: 8 (walking 8, sitting 5)/10 Post-procedure: 0-No pain/10  Pre-op Assessment:  Tamara Finley is a 77 y.o. (year old), female patient, seen today for interventional treatment. She  has a past surgical history that includes Cardiac surgery; Abdominal aortic aneurysm repair; Cardiac catheterization (N/A, 12/05/2015); Cardiac catheterization (Left, 12/31/2015); Breast excisional biopsy (Left, 2010); Breast excisional biopsy (Left, 07/01/2006); Breast biopsy (Left, 09/03/2016); Breast biopsy (Left, 09/25/2016); Bronchoscopy; CAROTID PTA/STENT INTERVENTION; Colonoscopy; Eye surgery; Laminectomy (07/25/2013); Back surgery; Mediastinoscopy; Mastectomy; Thoracoscopy (12/16/2016); and Colonoscopy with propofol (N/A, 06/15/2017). Tamara Finley has a current medication list which includes the following prescription(s): acetaminophen, albuterol, amlodipine, anastrozole, vitamin c, aspirin, atorvastatin, calcium carbonate-vitamin d, cholecalciferol, fluticasone, furosemide, gabapentin, hydralazine, hydrochlorothiazide, levothyroxine, metoprolol succinate, mirtazapine,  pantoprazole, polyethylene glycol powder, simvastatin, sodium chloride, vitamin b-12, vitamin d (ergocalciferol), clopidogrel, felodipine, gabapentin, gavilyte-n with flavor pack, magnesium, omega-3 acid ethyl esters, oxycodone, peg [id number]-[id number], potassium chloride sa, and tramadol, and the following Facility-Administered Medications: ropivacaine (pf) 2 mg/ml (0.2%). Her primarily concern today is the Back Pain (lower bilateral)  Initial Vital Signs:  Pulse/HCG Rate: 85  Temp: 97.7 F (36.5 C) Resp: 16 BP: (!) 176/92 SpO2: 99 %  BMI: Estimated body mass index is 30.49 kg/m as calculated from the following:   Height as of this encounter: 5' 4.25" (1.632 m).   Weight as of this encounter: 179 lb (81.2 kg).  Risk Assessment: Allergies: Reviewed. She is allergic to benadryl [diphenhydramine hcl (sleep)]; diphenhydramine hcl; and alprazolam.  Allergy Precautions: None required Coagulopathies: Reviewed. None identified.  Blood-thinner therapy: None at this time Active Infection(s): Reviewed. None identified. Tamara Finley is afebrile  Site Confirmation: Tamara Finley was asked to confirm the procedure and laterality before marking the site Procedure checklist: Completed Consent: Before the procedure and under the influence of no sedative(s), amnesic(s), or anxiolytics, the patient was informed of the treatment options, risks and possible complications. To fulfill our ethical and legal obligations, as recommended by the American Medical Association's Code of Ethics, I have informed the patient of my clinical impression; the nature and purpose of the treatment or procedure; the risks, benefits, and possible complications of the intervention; the alternatives, including doing nothing; the risk(s) and benefit(s) of the alternative treatment(s) or procedure(s); and the risk(s) and benefit(s) of doing nothing. The patient was provided information about the general risks and possible  complications associated with the procedure. These may include, but are not limited to: failure to achieve desired goals, infection, bleeding, organ or nerve damage, allergic reactions, paralysis, and death. In addition, the patient was informed of those risks and complications associated to Spine-related procedures, such as failure to decrease pain; infection (i.e.: Meningitis, epidural or intraspinal  abscess); bleeding (i.e.: epidural hematoma, subarachnoid hemorrhage, or any other type of intraspinal or peri-dural bleeding); organ or nerve damage (i.e.: Any type of peripheral nerve, nerve root, or spinal cord injury) with subsequent damage to sensory, motor, and/or autonomic systems, resulting in permanent pain, numbness, and/or weakness of one or several areas of the body; allergic reactions; (i.e.: anaphylactic reaction); and/or death. Furthermore, the patient was informed of those risks and complications associated with the medications. These include, but are not limited to: allergic reactions (i.e.: anaphylactic or anaphylactoid reaction(s)); adrenal axis suppression; blood sugar elevation that in diabetics may result in ketoacidosis or comma; water retention that in patients with history of congestive heart failure may result in shortness of breath, pulmonary edema, and decompensation with resultant heart failure; weight gain; swelling or edema; medication-induced neural toxicity; particulate matter embolism and blood vessel occlusion with resultant organ, and/or nervous system infarction; and/or aseptic necrosis of one or more joints. Finally, the patient was informed that Medicine is not an exact science; therefore, there is also the possibility of unforeseen or unpredictable risks and/or possible complications that may result in a catastrophic outcome. The patient indicated having understood very clearly. We have given the patient no guarantees and we have made no promises. Enough time was given to the  patient to ask questions, all of which were answered to the patient's satisfaction. Tamara Finley has indicated that she wanted to continue with the procedure. Attestation: I, the ordering provider, attest that I have discussed with the patient the benefits, risks, side-effects, alternatives, likelihood of achieving goals, and potential problems during recovery for the procedure that I have provided informed consent. Date  Time: 04/26/2018  9:20 AM  Pre-Procedure Preparation:  Monitoring: As per clinic protocol. Respiration, ETCO2, SpO2, BP, heart rate and rhythm monitor placed and checked for adequate function Safety Precautions: Patient was assessed for positional comfort and pressure points before starting the procedure. Time-out: I initiated and conducted the "Time-out" before starting the procedure, as per protocol. The patient was asked to participate by confirming the accuracy of the "Time Out" information. Verification of the correct person, site, and procedure were performed and confirmed by me, the nursing staff, and the patient. "Time-out" conducted as per Joint Commission's Universal Protocol (UP.01.01.01). Time: 1018  Description of Procedure:          Position: Prone Target Area: Caudal Epidural Canal. Approach: Midline approach. Area Prepped: Entire Posterior Sacrococcygeal Region Prepping solution: ChloraPrep (2% chlorhexidine gluconate and 70% isopropyl alcohol) Safety Precautions: Aspiration looking for blood return was conducted prior to all injections. At no point did we inject any substances, as a needle was being advanced. No attempts were made at seeking any paresthesias. Safe injection practices and needle disposal techniques used. Medications properly checked for expiration dates. SDV (single dose vial) medications used. Description of the Procedure: Protocol guidelines were followed. The patient was placed in position over the fluoroscopy table. The target area was identified  and the area prepped in the usual manner. Skin & deeper tissues infiltrated with local anesthetic. Appropriate amount of time allowed to pass for local anesthetics to take effect. The procedure needles were then advanced to the target area. Proper needle placement secured. Negative aspiration confirmed. Solution injected in intermittent fashion, asking for systemic symptoms every 0.5cc of injectate. The needles were then removed and the area cleansed, making sure to leave some of the prepping solution back to take advantage of its long term bactericidal properties. Vitals:   04/26/18 0939 04/26/18 1010  04/26/18 1020 04/26/18 1029  BP: (!) 176/92 (!) 159/86 (!) 157/72 (!) 160/74  Pulse: 85 90 91 90  Resp: 16 17 15 16   Temp: 97.7 F (36.5 C)     TempSrc: Oral     SpO2: 99% 93% 98% 97%  Weight: 179 lb (81.2 kg)     Height: 5' 4.25" (1.632 m)       Start Time: 1018 hrs. End Time: 1023 hrs. Materials:  Needle(s) Type: Epidural needle Gauge: 17G Length: 3.5-in Medication(s): Please see orders for medications and dosing details. 7 cc solution made a 5 cc of preservative-free saline, 1 cc of 0.2% ropivacaine, 1 cc of Decadron 10 mg/cc Imaging Guidance (Spinal):          Type of Imaging Technique: Fluoroscopy Guidance (Spinal) Indication(s): Assistance in needle guidance and placement for procedures requiring needle placement in or near specific anatomical locations not easily accessible without such assistance. Exposure Time: Please see nurses notes. Contrast: Before injecting any contrast, we confirmed that the patient did not have an allergy to iodine, shellfish, or radiological contrast. Once satisfactory needle placement was completed at the desired level, radiological contrast was injected. Contrast injected under live fluoroscopy. No contrast complications. See chart for type and volume of contrast used. Fluoroscopic Guidance: I was personally present during the use of fluoroscopy. "Tunnel  Vision Technique" used to obtain the best possible view of the target area. Parallax error corrected before commencing the procedure. "Direction-depth-direction" technique used to introduce the needle under continuous pulsed fluoroscopy. Once target was reached, antero-posterior, oblique, and lateral fluoroscopic projection used confirm needle placement in all planes. Images permanently stored in EMR. Interpretation: I personally interpreted the imaging intraoperatively. Adequate needle placement confirmed in multiple planes. Appropriate spread of contrast into desired area was observed. No evidence of afferent or efferent intravascular uptake. No intrathecal or subarachnoid spread observed. Permanent images saved into the patient's record.  Antibiotic Prophylaxis:   Anti-infectives (From admission, onward)   None     Indication(s): None identified  Post-operative Assessment:  Post-procedure Vital Signs:  Pulse/HCG Rate: 90  Temp: 97.7 F (36.5 C) Resp: 16 BP: (!) 160/74 SpO2: 97 %  EBL: None  Complications: No immediate post-treatment complications observed by team, or reported by patient.  Note: The patient tolerated the entire procedure well. A repeat set of vitals were taken after the procedure and the patient was kept under observation following institutional policy, for this type of procedure. Post-procedural neurological assessment was performed, showing return to baseline, prior to discharge. The patient was provided with post-procedure discharge instructions, including a section on how to identify potential problems. Should any problems arise concerning this procedure, the patient was given instructions to immediately contact us, at any time, without hesitation. In any case, we plan to contact the patient by telephone for a follow-up status report regarding this interventional procedure.  Comments:  No additional relevant information.  Plan of Care   Imaging Orders     DG  C-Arm 1-60 Min-No Report Procedure Orders    No procedure(s) ordered today    Medications ordered for procedure: Meds ordered this encounter  Medications  . iopamidol (ISOVUE-M) 41 % intrathecal injection 10 mL  . ropivacaine (PF) 2 mg/mL (0.2%) (NAROPIN) injection 2 mL  . sodium chloride flush (NS) 0.9 % injection 2 mL  . lidocaine (XYLOCAINE) 2 % (with pres) injection 200 mg  . dexamethasone (DECADRON) injection 10 mg   Medications administered: We administered iopamidol, sodium chloride flush, lidocaine, and  dexamethasone.  See the medical record for exact dosing, route, and time of administration.  New Prescriptions   No medications on file   Disposition: Discharge home  Discharge Date & Time: 04/26/2018; 1032 hrs.   Physician-requested Follow-up: Return in about 24 days (around 05/20/2018) for Post Procedure Evaluation.  Future Appointments  Date Time Provider [place]  05/20/2018 10:00 AM Gillis Santa, MD [facility] None  07/27/2018  9:30 AM [facility] None  07/27/2018 10:30 AM Dew, Erskine Squibb, MD [facility] None  04/15/2019  8:30 AM [facility] None  04/15/2019  9:30 AM Dew, Erskine Squibb, MD [facility] None   Primary Care Physician: Ezequiel Kayser, MD Location: [facility] Note by: Gillis Santa, MD Date: 04/26/2018; Time: 1:10 PM  Disclaimer:  Medicine is not an exact science. The only guarantee in medicine is that nothing is guaranteed. It is important to note that the decision to proceed with this intervention was based on the information collected from the patient. The Data and conclusions were drawn from the patient's questionnaire, the interview, and the physical examination. Because the information was provided in large part by the patient, it cannot be guaranteed that it has not been purposely or unconsciously manipulated. Every effort has been made to obtain as much relevant data as possible for this evaluation. It is  important to note that the conclusions that lead to this procedure are derived in large part from the available data. Always take into account that the treatment will also be dependent on availability of resources and existing treatment guidelines, considered by other Pain Management Practitioners as being common knowledge and practice, at the time of the intervention. For Medico-Legal purposes, it is also important to point out that variation in procedural techniques and pharmacological choices are the acceptable norm. The indications, contraindications, technique, and results of the above procedure should only be interpreted and judged by a Board-Certified Interventional Pain Specialist with extensive familiarity and expertise in the same exact procedure and technique.

## 2018-04-26 NOTE — Patient Instructions (Signed)
____________________________________________________________________________________________  Pain Scale  Introduction: The pain score used by this practice is the Verbal Numerical Rating Scale (VNRS-11). This is an 11-point scale. It is for adults and children 10 years or older. There are significant differences in how the pain score is reported, used, and applied. Forget everything you learned in the past and learn this scoring system.  General Information: The scale should reflect your current level of pain. Unless you are specifically asked for the level of your worst pain, or your average pain. If you are asked for one of these two, then it should be understood that it is over the past 24 hours.  Basic Activities of Daily Living (ADL): Personal hygiene, dressing, eating, transferring, and using restroom.  Instructions: Most patients tend to report their level of pain as a combination of two factors, their physical pain and their psychosocial pain. This last one is also known as "suffering" and it is reflection of how physical pain affects you socially and psychologically. From now on, report them separately. From this point on, when asked to report your pain level, report only your physical pain. Use the following table for reference.  Pain Clinic Pain Levels (0-5/10)  Pain Level Score  Description  No Pain 0   Mild pain 1 Nagging, annoying, but does not interfere with basic activities of daily living (ADL). Patients are able to eat, bathe, get dressed, toileting (being able to get on and off the toilet and perform personal hygiene functions), transfer (move in and out of bed or a chair without assistance), and maintain continence (able to control bladder and bowel functions). Blood pressure and heart rate are unaffected. A normal heart rate for a healthy adult ranges from 60 to 100 bpm (beats per minute).   Mild to moderate pain 2 Noticeable and distracting. Impossible to hide from other  people. More frequent flare-ups. Still possible to adapt and function close to normal. It can be very annoying and may have occasional stronger flare-ups. With discipline, patients may get used to it and adapt.   Moderate pain 3 Interferes significantly with activities of daily living (ADL). It becomes difficult to feed, bathe, get dressed, get on and off the toilet or to perform personal hygiene functions. Difficult to get in and out of bed or a chair without assistance. Very distracting. With effort, it can be ignored when deeply involved in activities.   Moderately severe pain 4 Impossible to ignore for more than a few minutes. With effort, patients may still be able to manage work or participate in some social activities. Very difficult to concentrate. Signs of autonomic nervous system discharge are evident: dilated pupils (mydriasis); mild sweating (diaphoresis); sleep interference. Heart rate becomes elevated (>115 bpm). Diastolic blood pressure (lower number) rises above 100 mmHg. Patients find relief in laying down and not moving.   Severe pain 5 Intense and extremely unpleasant. Associated with frowning face and frequent crying. Pain overwhelms the senses.  Ability to do any activity or maintain social relationships becomes significantly limited. Conversation becomes difficult. Pacing back and forth is common, as getting into a comfortable position is nearly impossible. Pain wakes you up from deep sleep. Physical signs will be obvious: pupillary dilation; increased sweating; goosebumps; brisk reflexes; cold, clammy hands and feet; nausea, vomiting or dry heaves; loss of appetite; significant sleep disturbance with inability to fall asleep or to remain asleep. When persistent, significant weight loss is observed due to the complete loss of appetite and sleep deprivation.  Blood   pressure and heart rate becomes significantly elevated. Caution: If elevated blood pressure triggers a pounding headache  associated with blurred vision, then the patient should immediately seek attention at an urgent or emergency care unit, as these may be signs of an impending stroke.    Emergency Department Pain Levels (6-10/10)  Emergency Room Pain 6 Severely limiting. Requires emergency care and should not be seen or managed at an outpatient pain management facility. Communication becomes difficult and requires great effort. Assistance to reach the emergency department may be required. Facial flushing and profuse sweating along with potentially dangerous increases in heart rate and blood pressure will be evident.   Distressing pain 7 Self-care is very difficult. Assistance is required to transport, or use restroom. Assistance to reach the emergency department will be required. Tasks requiring coordination, such as bathing and getting dressed become very difficult.   Disabling pain 8 Self-care is no longer possible. At this level, pain is disabling. The individual is unable to do even the most "basic" activities such as walking, eating, bathing, dressing, transferring to a bed, or toileting. Fine motor skills are lost. It is difficult to think clearly.   Incapacitating pain 9 Pain becomes incapacitating. Thought processing is no longer possible. Difficult to remember your own name. Control of movement and coordination are lost.   The worst pain imaginable 10 At this level, most patients pass out from pain. When this level is reached, collapse of the autonomic nervous system occurs, leading to a sudden drop in blood pressure and heart rate. This in turn results in a temporary and dramatic drop in blood flow to the brain, leading to a loss of consciousness. Fainting is one of the body's self defense mechanisms. Passing out puts the brain in a calmed state and causes it to shut down for a while, in order to begin the healing process.    Summary: 1. Refer to this scale when providing us with your pain level. 2. Be  accurate and careful when reporting your pain level. This will help with your care. 3. Over-reporting your pain level will lead to loss of credibility. 4. Even a level of 1/10 means that there is pain and will be treated at our facility. 5. High, inaccurate reporting will be documented as "Symptom Exaggeration", leading to loss of credibility and suspicions of possible secondary gains such as obtaining more narcotics, or wanting to appear disabled, for fraudulent reasons. 6. Only pain levels of 5 or below will be seen at our facility. 7. Pain levels of 6 and above will be sent to the Emergency Department and the appointment cancelled. ____________________________________________________________________________________________   ____________________________________________________________________________________________  Preparing for your procedure (without sedation)  Instructions: . Oral Intake: Do not eat or drink anything for at least 3 hours prior to your procedure. . Transportation: Unless otherwise stated by your physician, you may drive yourself after the procedure. . Blood Pressure Medicine: Take your blood pressure medicine with a sip of water the morning of the procedure. . Blood thinners: Notify our staff if you are taking any blood thinners. Depending on which one you take, there will be specific instructions on how and when to stop it. . Diabetics on insulin: Notify the staff so that you can be scheduled 1st case in the morning. If your diabetes requires high dose insulin, take only  of your normal insulin dose the morning of the procedure and notify the staff that you have done so. . Preventing infections: Shower with an antibacterial soap the   morning of your procedure.  . Build-up your immune system: Take 1000 mg of Vitamin C with every meal (3 times a day) the day prior to your procedure. . Antibiotics: Inform the staff if you have a condition or reason that requires you to take  antibiotics before dental procedures. . Pregnancy: If you are pregnant, call and cancel the procedure. . Sickness: If you have a cold, fever, or any active infections, call and cancel the procedure. . Arrival: You must be in the facility at least 30 minutes prior to your scheduled procedure. . Children: Do not bring any children with you. . Dress appropriately: Bring dark clothing that you would not mind if they get stained. . Valuables: Do not bring any jewelry or valuables.  Procedure appointments are reserved for interventional treatments only. . No Prescription Refills. . No medication changes will be discussed during procedure appointments. . No disability issues will be discussed.  Reasons to call and reschedule or cancel your procedure: (Following these recommendations will minimize the risk of a serious complication.) . Surgeries: Avoid having procedures within 2 weeks of any surgery. (Avoid for 2 weeks before or after any surgery). . Flu Shots: Avoid having procedures within 2 weeks of a flu shots or . (Avoid for 2 weeks before or after immunizations). . Barium: Avoid having a procedure within 7-10 days after having had a radiological study involving the use of radiological contrast. (Myelograms, Barium swallow or enema study). . Heart attacks: Avoid any elective procedures or surgeries for the initial 6 months after a "Myocardial Infarction" (Heart Attack). . Blood thinners: It is imperative that you stop these medications before procedures. Let us know if you if you take any blood thinner.  . Infection: Avoid procedures during or within two weeks of an infection (including chest colds or gastrointestinal problems). Symptoms associated with infections include: Localized redness, fever, chills, night sweats or profuse sweating, burning sensation when voiding, cough, congestion, stuffiness, runny nose, sore throat, diarrhea, nausea, vomiting, cold or Flu symptoms, recent or current  infections. It is specially important if the infection is over the area that we intend to treat. . Heart and lung problems: Symptoms that may suggest an active cardiopulmonary problem include: cough, chest pain, breathing difficulties or shortness of breath, dizziness, ankle swelling, uncontrolled high or unusually low blood pressure, and/or palpitations. If you are experiencing any of these symptoms, cancel your procedure and contact your primary care physician for an evaluation.  Remember:  Regular Business hours are:  Monday to Thursday 8:00 AM to 4:00 PM  Provider's Schedule: Francisco Naveira, MD:  Procedure days: Tuesday and Thursday 7:30 AM to 4:00 PM  Bilal Lateef, MD:  Procedure days: Monday and Wednesday 7:30 AM to 4:00 PM ____________________________________________________________________________________________    

## 2018-04-27 ENCOUNTER — Telehealth: Payer: Self-pay | Admitting: *Deleted

## 2018-04-27 NOTE — Telephone Encounter (Signed)
Attempted to call for post procedure follow-up. No answer, unable to leave a message. 

## 2018-05-18 ENCOUNTER — Ambulatory Visit
Admission: RE | Admit: 2018-05-18 | Discharge: 2018-05-18 | Disposition: A | Discharge status: Home / Self Care | Payer: Medicare HMO | Source: Ambulatory Visit | Attending: Student in an Organized Health Care Education/Training Program | Admitting: Student in an Organized Health Care Education/Training Program

## 2018-05-18 DIAGNOSIS — M8580 Other specified disorders of bone density and structure, unspecified site: Secondary | ICD-10-CM | POA: Diagnosis not present

## 2018-05-18 DIAGNOSIS — M25551 Pain in right hip: Secondary | ICD-10-CM

## 2018-05-18 DIAGNOSIS — M5136 Other intervertebral disc degeneration, lumbar region: Secondary | ICD-10-CM | POA: Insufficient documentation

## 2018-05-18 DIAGNOSIS — M25552 Pain in left hip: Secondary | ICD-10-CM | POA: Diagnosis not present

## 2018-05-18 DIAGNOSIS — M8938 Hypertrophy of bone, other site: Secondary | ICD-10-CM | POA: Insufficient documentation

## 2018-05-18 DIAGNOSIS — R937 Abnormal findings on diagnostic imaging of other parts of musculoskeletal system: Secondary | ICD-10-CM | POA: Insufficient documentation

## 2018-05-18 DIAGNOSIS — M4807 Spinal stenosis, lumbosacral region: Secondary | ICD-10-CM | POA: Insufficient documentation

## 2018-05-18 DIAGNOSIS — Z8781 Personal history of (healed) traumatic fracture: Secondary | ICD-10-CM | POA: Diagnosis not present

## 2018-05-20 ENCOUNTER — Ambulatory Visit: Payer: Medicare HMO | Admitting: Student in an Organized Health Care Education/Training Program

## 2018-05-25 ENCOUNTER — Encounter: Payer: Self-pay | Admitting: Student in an Organized Health Care Education/Training Program

## 2018-05-25 ENCOUNTER — Encounter (INDEPENDENT_AMBULATORY_CARE_PROVIDER_SITE_OTHER): Payer: Self-pay

## 2018-05-25 ENCOUNTER — Ambulatory Visit
Payer: Medicare HMO | Attending: Student in an Organized Health Care Education/Training Program | Admitting: Student in an Organized Health Care Education/Training Program

## 2018-05-25 ENCOUNTER — Other Ambulatory Visit: Payer: Self-pay

## 2018-05-25 VITALS — BP 165/75 | HR 87 | Temp 98.0°F | Resp 18 | Ht 63.0 in | Wt 173.0 lb

## 2018-05-25 DIAGNOSIS — Z888 Allergy status to other drugs, medicaments and biological substances status: Secondary | ICD-10-CM | POA: Insufficient documentation

## 2018-05-25 DIAGNOSIS — M48061 Spinal stenosis, lumbar region without neurogenic claudication: Secondary | ICD-10-CM | POA: Insufficient documentation

## 2018-05-25 DIAGNOSIS — M79604 Pain in right leg: Secondary | ICD-10-CM | POA: Diagnosis present

## 2018-05-25 DIAGNOSIS — M5441 Lumbago with sciatica, right side: Secondary | ICD-10-CM | POA: Insufficient documentation

## 2018-05-25 DIAGNOSIS — J449 Chronic obstructive pulmonary disease, unspecified: Secondary | ICD-10-CM | POA: Insufficient documentation

## 2018-05-25 DIAGNOSIS — K219 Gastro-esophageal reflux disease without esophagitis: Secondary | ICD-10-CM | POA: Insufficient documentation

## 2018-05-25 DIAGNOSIS — M5442 Lumbago with sciatica, left side: Secondary | ICD-10-CM | POA: Diagnosis not present

## 2018-05-25 DIAGNOSIS — N183 Chronic kidney disease, stage 3 (moderate): Secondary | ICD-10-CM | POA: Insufficient documentation

## 2018-05-25 DIAGNOSIS — I714 Abdominal aortic aneurysm, without rupture: Secondary | ICD-10-CM | POA: Diagnosis not present

## 2018-05-25 DIAGNOSIS — G894 Chronic pain syndrome: Secondary | ICD-10-CM | POA: Insufficient documentation

## 2018-05-25 DIAGNOSIS — M79605 Pain in left leg: Secondary | ICD-10-CM | POA: Insufficient documentation

## 2018-05-25 DIAGNOSIS — I129 Hypertensive chronic kidney disease with stage 1 through stage 4 chronic kidney disease, or unspecified chronic kidney disease: Secondary | ICD-10-CM | POA: Diagnosis not present

## 2018-05-25 DIAGNOSIS — F329 Major depressive disorder, single episode, unspecified: Secondary | ICD-10-CM | POA: Diagnosis not present

## 2018-05-25 DIAGNOSIS — Z79899 Other long term (current) drug therapy: Secondary | ICD-10-CM | POA: Diagnosis not present

## 2018-05-25 DIAGNOSIS — F419 Anxiety disorder, unspecified: Secondary | ICD-10-CM | POA: Insufficient documentation

## 2018-05-25 DIAGNOSIS — E039 Hypothyroidism, unspecified: Secondary | ICD-10-CM | POA: Diagnosis not present

## 2018-05-25 DIAGNOSIS — M1611 Unilateral primary osteoarthritis, right hip: Secondary | ICD-10-CM | POA: Diagnosis not present

## 2018-05-25 DIAGNOSIS — E1122 Type 2 diabetes mellitus with diabetic chronic kidney disease: Secondary | ICD-10-CM | POA: Insufficient documentation

## 2018-05-25 DIAGNOSIS — Z79890 Hormone replacement therapy (postmenopausal): Secondary | ICD-10-CM | POA: Diagnosis not present

## 2018-05-25 DIAGNOSIS — I701 Atherosclerosis of renal artery: Secondary | ICD-10-CM | POA: Insufficient documentation

## 2018-05-25 DIAGNOSIS — M533 Sacrococcygeal disorders, not elsewhere classified: Secondary | ICD-10-CM | POA: Insufficient documentation

## 2018-05-25 DIAGNOSIS — M47816 Spondylosis without myelopathy or radiculopathy, lumbar region: Secondary | ICD-10-CM | POA: Insufficient documentation

## 2018-05-25 DIAGNOSIS — Z87891 Personal history of nicotine dependence: Secondary | ICD-10-CM | POA: Diagnosis not present

## 2018-05-25 DIAGNOSIS — Z7982 Long term (current) use of aspirin: Secondary | ICD-10-CM | POA: Diagnosis not present

## 2018-05-25 DIAGNOSIS — M5136 Other intervertebral disc degeneration, lumbar region: Secondary | ICD-10-CM | POA: Diagnosis not present

## 2018-05-25 DIAGNOSIS — Z8673 Personal history of transient ischemic attack (TIA), and cerebral infarction without residual deficits: Secondary | ICD-10-CM | POA: Insufficient documentation

## 2018-05-25 DIAGNOSIS — I6523 Occlusion and stenosis of bilateral carotid arteries: Secondary | ICD-10-CM | POA: Diagnosis not present

## 2018-05-25 DIAGNOSIS — Z9889 Other specified postprocedural states: Secondary | ICD-10-CM | POA: Insufficient documentation

## 2018-05-25 DIAGNOSIS — G8929 Other chronic pain: Secondary | ICD-10-CM

## 2018-05-25 NOTE — Progress Notes (Signed)
Safety precautions to be maintained throughout the outpatient stay will include: orient to surroundings, keep bed in low position, maintain call bell within reach at all times, provide assistance with transfer out of bed and ambulation.  

## 2018-05-25 NOTE — Progress Notes (Signed)
Patient's Name: Tamara Finley  MRN: [id number]  Referring Provider: Ezequiel Kayser, MD  DOB: December 19, 1940  PCP: Ezequiel Kayser, MD  DOS: 05/25/2018  Note by: Gillis Santa, MD  Service setting: Ambulatory outpatient  Specialty: Interventional Pain Management  Location: [facility] (AMB) Pain Management Facility    Patient type: Established   Primary Reason(s) for Visit: Encounter for post-procedure evaluation of chronic illness with mild to moderate exacerbation CC: Leg Pain (bilateral,right worse than left)  HPI  Tamara Finley is a 77 y.o. year old, female patient, who comes today for a post-procedure evaluation. She has TIA (transient ischemic attack); Renovascular hypertension, malignant; Malignant hypertension; Carotid stenosis; Adenomatous colon polyp; Anxiety and depression; Breast calcification, left; Carotid atherosclerosis, bilateral; Chronic insomnia; COPD (chronic obstructive pulmonary disease) (HCC); CRI (chronic renal insufficiency), stage 3 (moderate) ([place]); Elevated blood sugar; GERD (gastroesophageal reflux disease); Heart palpitations; History of AAA (abdominal aortic aneurysm) repair; History of TIA (transient ischemic attack); Hyperlipidemia, unspecified; Hypertension; Hypothyroidism; Left breast mass; Low back pain radiating to both legs; Lumbar stenosis; Migraine headache; Osteoarthritis; Osteoporosis, postmenopausal; Perennial allergic rhinitis; Peripheral arterial disease ([place]); Primary localized osteoarthrosis, pelvic region and thigh; Primary osteoarthritis of right hip; Right hip pain; Sacral back pain; SOB (shortness of breath); Stenosis of left subclavian artery ([place]); Type 2 diabetes mellitus with vascular disease ([place]); ERRONEOUS ENCOUNTER--DISREGARD; Primary cancer of upper inner quadrant of left female breast ([place]); and Renal artery stenosis (HCC) on their problem list. Her primarily concern today is the Leg Pain (bilateral,right worse than left)  Pain Assessment: Location: Right, Left  Leg Radiating: radiates down the back of legs to feet Onset: More than a month ago Duration: Chronic pain Quality: Aching, Sharp, Dull Severity: 8 /10 (subjective, self-reported pain score)  Note: Reported level is compatible with observation.                         When using our objective Pain Scale, levels between 6 and 10/10 are said to belong in an emergency room, as it progressively worsens from a 6/10, described as severely limiting, requiring emergency care not usually available at an outpatient pain management facility. At a 6/10 level, communication becomes difficult and requires great effort. Assistance to reach the emergency department may be required. Facial flushing and profuse sweating along with potentially dangerous increases in heart rate and blood pressure will be evident. Effect on ADL: limits activities Timing: Constant Modifying factors: laying down helps sometimes BP: (!) 165/75  HR: 87  Tamara Finley comes in today for post-procedure evaluation after the treatment done on 04/27/2018.  Further details on both, my assessment(s), as well as the proposed treatment plan, please see below.  Post-Procedure Assessment  04/26/2018 Procedure: caudal ESI Pre-procedure pain score:  8/10 Post-procedure pain score: 0/10         Influential Factors: BMI: 30.65 kg/m Intra-procedural challenges: None observed.         Assessment challenges: None detected.              Reported side-effects: None.        Post-procedural adverse reactions or complications: None reported         Sedation: Please see nurses note. When no sedatives are used, the analgesic levels obtained are directly associated to the effectiveness of the local anesthetics. However, when sedation is provided, the level of analgesia obtained during the initial 1 hour following the intervention, is believed to be the result of a combination of factors. These factors  may include, but are not limited to: 1. The effectiveness  of the local anesthetics used. 2. The effects of the analgesic(s) and/or anxiolytic(s) used. 3. The degree of discomfort experienced by the patient at the time of the procedure. 4. The patients ability and reliability in recalling and recording the events. 5. The presence and influence of possible secondary gains and/or psychosocial factors. Reported result: Relief experienced during the 1st hour after the procedure: 50 % (Ultra-Short Term Relief)            Interpretative annotation: Clinically appropriate result. Analgesia during this period is likely to be Local Anesthetic and/or IV Sedative (Analgesic/Anxiolytic) related.          Effects of local anesthetic: The analgesic effects attained during this period are directly associated to the localized infiltration of local anesthetics and therefore cary significant diagnostic value as to the etiological location, or anatomical origin, of the pain. Expected duration of relief is directly dependent on the pharmacodynamics of the local anesthetic used. Long-acting (4-6 hours) anesthetics used.  Reported result: Relief during the next 4 to 6 hour after the procedure: 50 % (Short-Term Relief)            Interpretative annotation: Clinically appropriate result. Analgesia during this period is likely to be Local Anesthetic-related.          Long-term benefit: Defined as the period of time past the expected duration of local anesthetics (1 hour for short-acting and 4-6 hours for long-acting). With the possible exception of prolonged sympathetic blockade from the local anesthetics, benefits during this period are typically attributed to, or associated with, other factors such as analgesic sensory neuropraxia, antiinflammatory effects, or beneficial biochemical changes provided by agents other than the local anesthetics.  Reported result: Extended relief following procedure: 0 % (Long-Term Relief)            Interpretative annotation: Clinically possible  results. No benefit. No long-term benefit attained. No significant inflammatory component detected.          Current benefits: Defined as reported results that persistent at this point in time.   Analgesia: 0-25 %            Function: No benefit ROM: No benefit Interpretative annotation: No benefit. Therapeutic failure. Results would argue against repeating therapy.          Interpretation: Results would suggest failure of therapy in achieving desired goal(s).                  Plan:  Please see "Plan of Care" for details.                Laboratory Chemistry  Inflammation Markers (CRP: Acute Phase) (ESR: Chronic Phase) Lab Results  Component Value Date   ESRSEDRATE 23 10/12/2015                         Rheumatology Markers No results found for: RF, ANA, LABURIC, URICUR, LYMEIGGIGMAB, LYMEABIGMQN, HLAB27                      Renal Function Markers Lab Results  Component Value Date   BUN 9 01/01/2016   CREATININE 1.03 (H) 01/01/2016   GFRAA >60 01/01/2016   GFRNONAA 53 (L) 01/01/2016                             Hepatic Function Markers Lab Results  Component Value Date   AST 28 10/17/2015   ALT 17 10/17/2015   ALBUMIN 3.7 10/17/2015   ALKPHOS 64 10/17/2015                        Electrolytes Lab Results  Component Value Date   NA 139 01/01/2016   K 3.5 01/01/2016   CL 106 01/01/2016   CALCIUM 8.2 (L) 01/01/2016                        Neuropathy Markers Lab Results  Component Value Date   HGBA1C 5.3 10/17/2015                        CNS Tests No results found for: COLORCSF, APPEARCSF, RBCCOUNTCSF, WBCCSF, POLYSCSF, LYMPHSCSF, EOSCSF, PROTEINCSF, GLUCCSF, JCVIRUS, CSFOLI, IGGCSF                      Bone Pathology Markers No results found for: VD25OH, [id number], [id number], [id number], 25OHVITD1, 25OHVITD2, 25OHVITD3, TESTOFREE, TESTOSTERONE                       Coagulation Parameters Lab Results  Component Value Date   INR 1.04 10/17/2015   LABPROT 13.8  10/17/2015   APTT 29 10/17/2015   PLT 241 01/01/2016                        Cardiovascular Markers Lab Results  Component Value Date   TROPONINI 0.05 (H) 10/18/2015   HGB 11.2 (L) 01/01/2016   HCT 33.9 (L) 01/01/2016                         CA Markers No results found for: CEA, CA125, LABCA2                      Note: Lab results reviewed.  Recent Diagnostic Imaging Results  DG HIP UNILAT W OR W/O PELVIS 2-3 VIEWS RIGHT CLINICAL DATA:  Hip pain  EXAM: DG HIP (WITH OR WITHOUT PELVIS) 2-3V RIGHT  COMPARISON:  None.  FINDINGS: Moderate lateral spurring of the femoral head and of the acetabulum. Preserved articular cartilage space.  Lower lumbar degenerative disc disease with loss of disc height at L5-S1.  Deformity left inferior pubic ramus and probably the left pubic body suggesting subacute fractures. No discrete fracture involving the right hip.  IMPRESSION: 1. Deformity in the left pubic body and left inferior pubic ramus compatible with fractures which could be subacute or chronic. There is some callus formation in this vicinity. 2. Moderate degenerative spurring of the right femoral head and acetabular margin without significant loss of articular space.  Electronically Signed   By: Van Clines M.D.   On: 05/19/2018 08:37 DG Si Joints CLINICAL DATA:  Low back pain and hip pain  EXAM: BILATERAL SACROILIAC JOINTS - 3+ VIEW  COMPARISON:  MRI of the pelvis dated 12/22/2013.  FINDINGS: No significant erosions or sclerosis along the SI joints.  There is deformity of the left inferior pubic ramus compatible with fracture. Given the marginal sclerosis is probably subacute. This also implies a potential fracture of the superior pubic ramus although this is less well-defined.  Bony demineralization is present. Preserved articular space in the hips.  Lower lumbar degenerative disc disease with loss of disc height at L4-5 and L5-S1.  IMPRESSION: 1.  Probably subacute fracture of the left inferior pubic ramus. Superior pubic ramus involvement is implied but poorly seen. There is some potential mild superior surface irregularity of the pubis on the left which may also indicate fracture. Commonly such fracture pattern would also include a fracture elsewhere in the pelvis, such as involving the sacrum, but no definite sacral fracture is appreciated on today's conventional radiographs. These fractures could be further assessed with CT or MRI if clinically warranted. 2. Lower lumbar degenerative disc disease. 3. No significant degree of sacroiliitis ([place] criteria grade 0). 4. Bony demineralization.  Electronically Signed   By: Van Clines M.D.   On: 05/19/2018 08:35 DG Lumbar Spine Complete W/Bend CLINICAL DATA:  Lumbar degenerative disc disease.  EXAM: LUMBAR SPINE - COMPLETE WITH BENDING VIEWS  COMPARISON:  MRI of the lumbar spine 11/26/2012.  FINDINGS: Five non rib-bearing lumbar type vertebral bodies are present.  The inferior endplate compression fracture at L3 results in 25% loss of height relative to the adjacent L2 vertebral body. There is some retropulsion of bone at the L3-4 level. This likely results in significant central canal stenosis. Slight retrolisthesis at L3-4 is new, measuring up to 3 mm. AP alignment is otherwise anatomic. There straightening of the normal lumbar lordosis.  There is no abnormal motion during flexion or extension.  Chronic endplate changes and spurring are present at L4-5 and L5-S1. Facet hypertrophy is worse at L5-S1. Patient status post L4 and L5 laminectomies.  Partial fusion is noted at the SI joints bilaterally.  Atherosclerotic changes are present in the aorta. A left renal stent is noted.  IMPRESSION: 1. Laminectomies at L4 and L5. 2. Adjacent level compression fracture along the inferior endplate of L3 is likely remote. 3. Retropulsed bone at L3-4 likely  contributes to significant central canal stenosis, new from prior exams. 4. No abnormal motion with flexion or extension. No significant listhesis of greater than 4 mm. 5. Facet hypertrophy is greatest at L5-S1 and contributes to foraminal narrowing.  Electronically Signed   By: San Morelle M.D.   On: 05/19/2018 08:33 DG HIP UNILAT W OR W/O PELVIS 2-3 VIEWS LEFT CLINICAL DATA:  Left hip pain.  EXAM: DG HIP (WITH OR WITHOUT PELVIS) 2-3V LEFT  COMPARISON:  None.  FINDINGS: There is no evidence of hip fracture or dislocation. There is no evidence of hip arthropathy. Old fracture deformity is seen involving the left inferior pubic ramus. Generalized osteopenia also noted.  IMPRESSION: No acute findings.  Old fracture deformity of left inferior pubic ramus.  Osteopenia.  Electronically Signed   By: Earle Gell M.D.   On: 05/19/2018 08:30  Complexity Note: Imaging results reviewed. Results shared with Tamara Finley, using Layman's terms. Today I personally and independently reviewed the study images pertinent to Tamara Finley's problem.            I have personally examined the images and I agree with the reported  findings. I find no additional pain-related pathology to add to the report.  Meds   Current Outpatient Medications:  .  acetaminophen (TYLENOL) 500 MG tablet, Take by mouth., Disp: , Rfl:  .  albuterol (PROVENTIL HFA;VENTOLIN HFA) 108 (90 BASE) MCG/ACT inhaler, Inhale 2 puffs into the lungs every 6 (six) hours as needed for wheezing or shortness of breath. Reported on 12/31/2015, Disp: , Rfl:  .  amLODipine (NORVASC) 5 MG tablet, Take 5 mg by mouth daily., Disp: , Rfl:  .  anastrozole (ARIMIDEX) 1 MG  tablet, Take 1 mg by mouth daily. , Disp: , Rfl:  .  Ascorbic Acid (VITAMIN C) POWD, Take 1 application by mouth daily. , Disp: , Rfl:  .  aspirin 81 MG chewable tablet, Chew 81 mg by mouth daily., Disp: , Rfl:  .  cholecalciferol (VITAMIN D) 1000 units tablet, Take  2,000 Units by mouth daily. Reported on 12/31/2015, Disp: , Rfl:  .  fluticasone (FLONASE) 50 MCG/ACT nasal spray, Place 1-2 sprays into both nostrils daily as needed for rhinitis., Disp: , Rfl:  .  furosemide (LASIX) 40 MG tablet, Take 1 tablet by mouth 1 day or 1 dose., Disp: , Rfl:  .  gabapentin (NEURONTIN) 300 MG capsule, Take 300 mg by mouth 3 (three) times daily., Disp: , Rfl:  .  hydrALAZINE (APRESOLINE) 25 MG tablet, Take 25 mg by mouth 3 (three) times daily., Disp: , Rfl:  .  hydrochlorothiazide (HYDRODIURIL) 25 MG tablet, Take 25 mg by mouth daily., Disp: , Rfl:  .  levothyroxine (SYNTHROID, LEVOTHROID) 88 MCG tablet, Take 88 mcg by mouth daily before breakfast., Disp: , Rfl:  .  magnesium 30 MG tablet, Take 250 mg by mouth daily., Disp: , Rfl:  .  metoprolol succinate (TOPROL-XL) 50 MG 24 hr tablet, Take 50 mg by mouth daily. Reported on 12/31/2015, Disp: , Rfl:  .  mirtazapine (REMERON) 15 MG tablet, TAKE 1 TABLET BY MOUTH EVERY DAY AT NIGHT, Disp: , Rfl: 3 .  pantoprazole (PROTONIX) 40 MG tablet, Take 40 mg by mouth daily., Disp: , Rfl:  .  polyethylene glycol powder (GLYCOLAX/MIRALAX) powder, Take 0.5 Containers by mouth as needed. , Disp: , Rfl:  .  potassium chloride SA (K-DUR,KLOR-CON) 20 MEQ tablet, Take 20 mEq by mouth as directed., Disp: , Rfl:  .  simvastatin (ZOCOR) 80 MG tablet, Take 80 mg by mouth at bedtime. Reported on 12/31/2015, Disp: , Rfl:  .  sodium chloride (OCEAN) 0.65 % SOLN nasal spray, Place 1-2 sprays into both nostrils as needed for congestion., Disp: , Rfl:  .  vitamin B-12 (CYANOCOBALAMIN) 1000 MCG tablet, Take 1 tablet by mouth 1 day or 1 dose., Disp: , Rfl:  .  Vitamin D, Ergocalciferol, (DRISDOL) 50000 units CAPS capsule, Take 1 capsule by mouth once a week., Disp: , Rfl:  .  atorvastatin (LIPITOR) 40 MG tablet, Take 40 mg by mouth daily at 6 PM. , Disp: , Rfl:  .  Calcium Carbonate-Vitamin D (CALCIUM 600+D) 600-400 MG-UNIT tablet, Take 1 tablet by mouth 2  (two) times daily with a meal., Disp: , Rfl:  .  clopidogrel (PLAVIX) 75 MG tablet, Take 75 mg by mouth daily., Disp: , Rfl:  .  felodipine (PLENDIL) 10 MG 24 hr tablet, Take 10 mg by mouth daily. Reported on 12/31/2015, Disp: , Rfl:  .  gabapentin (NEURONTIN) 300 MG capsule, Take 1 capsule by mouth 1 day or 1 dose., Disp: , Rfl:  .  GAVILYTE-N WITH FLAVOR PACK 420 g solution, , Disp: , Rfl:  .  omega-3 acid ethyl esters (LOVAZA) 1 g capsule, Take 1 g by mouth daily., Disp: , Rfl:  .  oxyCODONE (OXY IR/ROXICODONE) 5 MG immediate release tablet, Take by mouth., Disp: , Rfl:  .  PEG 3350-KCl-NaBcb-NaCl-NaSulf (PEG-3350/ELECTROLYTES PO), Take 4,000 mLs by mouth as directed., Disp: , Rfl:  .  traMADol (ULTRAM) 50 MG tablet, Take 50 mg by mouth every 6 (six) hours as needed for moderate pain. , Disp: , Rfl:   ROS  Constitutional:  Denies any fever or chills Gastrointestinal: No reported hemesis, hematochezia, vomiting, or acute GI distress Musculoskeletal: Denies any acute onset joint swelling, redness, loss of ROM, or weakness Neurological: No reported episodes of acute onset apraxia, aphasia, dysarthria, agnosia, amnesia, paralysis, loss of coordination, or loss of consciousness  Allergies  Tamara Finley is allergic to benadryl [diphenhydramine hcl (sleep)]; diphenhydramine hcl; and alprazolam.  PFSH  Drug: Tamara Finley  reports that she does not use drugs. Alcohol:  reports that she does not drink alcohol. Tobacco:  reports that she quit smoking about 4 years ago. Her smoking use included cigarettes. She has a 100.00 pack-year smoking history. She has never used smokeless tobacco. Medical:  has a past medical history of Abdominal aortic aneurysm (AAA) ([place]) (2000's), Age related entropion, unspecified laterality, Anxiety, Anxiety and depression, Bilateral renal artery stenosis ([place]), Breast calcification, left, Cancer ([place]) (10/20/2016), Carotid artery thrombosis, bilateral, Carotid stenosis, Chronic  bronchitis (HCC), Chronic insomnia, COPD (chronic obstructive pulmonary disease) ([place]), Coronary artery disease, CRI (chronic renal insufficiency), stage 3 (moderate) ([place]), Depression, Diabetes mellitus without complication ([place]), GERD (gastroesophageal reflux disease), Heart palpitations, History of AAA (abdominal aortic aneurysm) repair, History of colon polyps, History of TIA (transient ischemic attack), Hypercholesterolemia, Hypertension, Hypothyroidism, Migraine headache, Osteoarthritis, Osteoporosis, Peripheral arterial disease ([place]), Pulmonary scarring, Renal disorder, SOB (shortness of breath), Stenosis of left subclavian artery ([place]), Stroke ([place]), Thyroid disease, TIA (transient ischemic attack), and Type 2 diabetes mellitus with vascular disease ([place]). Surgical: Tamara Finley  has a past surgical history that includes Cardiac surgery; Abdominal aortic aneurysm repair; Cardiac catheterization (N/A, 12/05/2015); Cardiac catheterization (Left, 12/31/2015); Breast excisional biopsy (Left, 2010); Breast excisional biopsy (Left, 07/01/2006); Breast biopsy (Left, 09/03/2016); Breast biopsy (Left, 09/25/2016); Bronchoscopy; CAROTID PTA/STENT INTERVENTION; Colonoscopy; Eye surgery; Laminectomy (07/25/2013); Back surgery; Mediastinoscopy; Mastectomy; Thoracoscopy (12/16/2016); and Colonoscopy with propofol (N/A, 06/15/2017). Family: family history includes CVA in her mother; Cancer in her brother; Coronary artery disease in her father; Dementia in her father; Diabetes in her brother; Epilepsy in her mother; Hypertension in her father and mother; Ovarian cancer in her sister; Stroke in her father and mother.  Constitutional Exam  General appearance: Well nourished, well developed, and well hydrated. In no apparent acute distress Vitals:   05/25/18 1440  BP: (!) 165/75  Pulse: 87  Resp: 18  Temp: 98 F (36.7 C)  SpO2: 96%  Weight: 173 lb (78.5 kg)  Height: 5' 3"  (1.6 m)   BMI Assessment: Estimated body  mass index is 30.65 kg/m as calculated from the following:   Height as of this encounter: 5' 3"  (1.6 m).   Weight as of this encounter: 173 lb (78.5 kg).  BMI interpretation table: BMI level Category Range association with higher incidence of chronic pain  <18 kg/m2 Underweight   18.5-24.9 kg/m2 Ideal body weight   25-29.9 kg/m2 Overweight Increased incidence by 20%  30-34.9 kg/m2 Obese (Class I) Increased incidence by 68%  35-39.9 kg/m2 Severe obesity (Class II) Increased incidence by 136%  >40 kg/m2 Extreme obesity (Class III) Increased incidence by 254%   Patient's current BMI Ideal Body weight  Body mass index is 30.65 kg/m. Ideal body weight: 52.4 kg (115 lb 8.3 oz) Adjusted ideal body weight: 62.8 kg (138 lb 8.2 oz)   BMI Readings from Last 4 Encounters:  05/25/18 30.65 kg/m  04/26/18 30.49 kg/m  04/13/18 30.73 kg/m  04/06/18 30.15 kg/m   Wt Readings from Last 4 Encounters:  05/25/18 173 lb (78.5 kg)  04/26/18 179 lb (81.2 kg)  04/13/18 179 lb (81.2 kg)  04/06/18 177 lb (80.3 kg)  Psych/Mental status: Alert, oriented x 3 (person, place, & time)       Eyes: PERLA Respiratory: No evidence of acute respiratory distress  Cervical Spine Area Exam  Skin & Axial Inspection: No masses, redness, edema, swelling, or associated skin lesions Alignment: Symmetrical Functional ROM: Unrestricted ROM      Stability: No instability detected Muscle Tone/Strength: Functionally intact. No obvious neuro-muscular anomalies detected. Sensory (Neurological): Unimpaired Palpation: No palpable anomalies              Upper Extremity (UE) Exam    Side: Right upper extremity  Side: Left upper extremity  Skin & Extremity Inspection: Skin color, temperature, and hair growth are WNL. No peripheral edema or cyanosis. No masses, redness, swelling, asymmetry, or associated skin lesions. No contractures.  Skin & Extremity Inspection: Skin color, temperature, and hair growth are WNL. No peripheral  edema or cyanosis. No masses, redness, swelling, asymmetry, or associated skin lesions. No contractures.  Functional ROM: Unrestricted ROM          Functional ROM: Unrestricted ROM          Muscle Tone/Strength: Functionally intact. No obvious neuro-muscular anomalies detected.  Muscle Tone/Strength: Functionally intact. No obvious neuro-muscular anomalies detected.  Sensory (Neurological): Unimpaired          Sensory (Neurological): Unimpaired          Palpation: No palpable anomalies              Palpation: No palpable anomalies              Provocative Test(s):  Phalen's test: deferred Tinel's test: deferred Apley's scratch test (touch opposite shoulder):  Action 1 (Across chest): deferred Action 2 (Overhead): deferred Action 3 (LB reach): deferred   Provocative Test(s):  Phalen's test: deferred Tinel's test: deferred Apley's scratch test (touch opposite shoulder):  Action 1 (Across chest): deferred Action 2 (Overhead): deferred Action 3 (LB reach): deferred    Thoracic Spine Area Exam  Skin & Axial Inspection: No masses, redness, or swelling Alignment: Symmetrical Functional ROM: Unrestricted ROM Stability: No instability detected Muscle Tone/Strength: Functionally intact. No obvious neuro-muscular anomalies detected. Sensory (Neurological): Unimpaired Muscle strength & Tone: No palpable anomalies  Lumbar Spine Area Exam  Skin & Axial Inspection: Well healed scar from previous spine surgery detected Alignment: Symmetrical Functional ROM: Decreased ROM affecting both sides Stability: No instability detected Muscle Tone/Strength: Functionally intact. No obvious neuro-muscular anomalies detected. Sensory (Neurological): Musculoskeletal pain pattern Palpation: No palpable anomalies       Provocative Tests: Hyperextension/rotation test: (+) bilaterally for facet joint pain. Lumbar quadrant test (Kemp's test): (+) bilaterally for facet joint pain. Lateral bending test: (+) due  to pain. Patrick's Maneuver: (+) for bilateral S-I arthralgia             FABER test: deferred today                   S-I anterior distraction/compression test: deferred today         S-I lateral compression test: deferred today         S-I Thigh-thrust test: deferred today         S-I Gaenslen's test: deferred today          Gait & Posture Assessment  Ambulation: Patient ambulates using a walker Gait: Limited. Using assistive device to ambulate Posture: Difficulty standing up straight, due to pain   Lower Extremity Exam  Side: Right lower extremity  Side: Left lower extremity  Stability: No instability observed          Stability: No instability observed          Skin & Extremity Inspection: Skin color, temperature, and hair growth are WNL. No peripheral edema or cyanosis. No masses, redness, swelling, asymmetry, or associated skin lesions. No contractures.  Skin & Extremity Inspection: Skin color, temperature, and hair growth are WNL. No peripheral edema or cyanosis. No masses, redness, swelling, asymmetry, or associated skin lesions. No contractures.  Functional ROM: Unrestricted ROM                  Functional ROM: Unrestricted ROM                  Muscle Tone/Strength: Functionally intact. No obvious neuro-muscular anomalies detected.  Muscle Tone/Strength: Functionally intact. No obvious neuro-muscular anomalies detected.  Sensory (Neurological): Unimpaired  Sensory (Neurological): Unimpaired  Palpation: No palpable anomalies  Palpation: No palpable anomalies   Assessment  Primary Diagnosis & Pertinent Problem List: The primary encounter diagnosis was Lumbar facet arthropathy (L4/L5 and L5/S1). Diagnoses of Lumbar degenerative disc disease, Lumbar spondylosis, Chronic bilateral low back pain with bilateral sciatica, and Chronic pain syndrome were also pertinent to this visit.  Status Diagnosis  Having a Flare-up Persistent Persistent 1. Lumbar facet arthropathy (L4/L5 and  L5/S1)   2. Lumbar degenerative disc disease   3. Lumbar spondylosis   4. Chronic bilateral low back pain with bilateral sciatica   5. Chronic pain syndrome     General Recommendations: The pain condition that the patient suffers from is best treated with a multidisciplinary approach that involves an increase in physical activity to prevent de-conditioning and worsening of the pain cycle, as well as psychological counseling (formal and/or informal) to address the co-morbid psychological affects of pain. Treatment will often involve judicious use of pain medications and interventional procedures to decrease the pain, allowing the patient to participate in the physical activity that will ultimately produce long-lasting pain reductions. The goal of the multidisciplinary approach is to return the patient to a higher level of overall function and to restore their ability to perform activities of daily living.  Patient is a very pleasant 77 year old female with a history of chronic kidney disease, COPD, bilateral renal artery stenosis, osteoarthritis, abdominal aortic aneurysm status post repair in 2010, depression who presents with axial low back pain that radiates down both of her legs and a posterior lateral distribution stopping usually at her calves.  This is likely secondary to lumbar radiculopathy, lumbar degenerative disc disease, lumbar facet arthropathy.  Patient follows up status post caudal ESI which was not effective for her symptoms.  We reviewed her lumbar spine x-rays as well as her SI joint x-rays which showed SI joint arthralgia, history of pubic ramus fracture, L3 compression fracture along with L5-S1 facet pathology.  We discussed diagnostic lumbar facet medial branch nerve blocks.  SIGRID SCHWEBACH has a history of greater than 3 months of moderate to severe pain which is resulted in functional impairment.  The patient has tried various conservative therapeutic options such as NSAIDs,  Tylenol, muscle relaxants, physical therapy which was inadequately effective.  Patient's pain is predominantly axial with physical exam findings suggestive of facet arthropathy.  Lumbar facet medial branch nerve blocks were discussed with the patient.  Risks and benefits were reviewed.  Patient would like to proceed with bilateral medial branch nerve block @ L4/L5 and L5/S1 (below  her previous surgery).   Plan of Care   Lab-work, procedure(s), and/or referral(s): Orders Placed This Encounter  Procedures  . LUMBAR FACET(MEDIAL BRANCH NERVE BLOCK) [facility]   Time Note: Greater than 50% of the 25 minute(s) of face-to-face time spent with Tamara Finley, was spent in counseling/coordination of care regarding: Tamara Finley primary cause of pain, the results of her recent test(s), the significance of each one oth the test(s) anomalies and it's corresponding characteristic pain pattern(s), the treatment plan, treatment alternatives, the risks and possible complications of proposed treatment, going over the informed consent, the results, interpretation and significance of  her recent diagnostic interventional treatment(s), realistic expectations and the goals of pain management (increased in functionality).  Provider-requested follow-up: Return in about 2 weeks (around 06/08/2018) for Procedure.  Future Appointments  Date Time Provider [place]  07/27/2018  9:30 AM [facility] 1 [facility] None  07/27/2018 10:30 AM Dew, Erskine Squibb, MD [facility] None  04/15/2019  8:30 AM [facility] 2 [facility] None  04/15/2019  9:30 AM Dew, Erskine Squibb, MD [facility] None    Primary Care Physician: Ezequiel Kayser, MD Location: [facility] Note by: Gillis Santa, M.D Date: 05/25/2018; Time: 4:22 PM  Patient Instructions     Stop aspirin 325 mg 7 days prior to procedure and take Aspirin 81 mg instead. Pre procedure instructions given with sedation.        Preparing for Procedure with  Sedation Instructions: . Oral Intake: Do not eat or drink anything for at least 8 hours prior to your procedure. . Transportation: Public transportation is not allowed. Bring an adult driver. The driver must be physically present in our waiting room before any procedure can be started. Marland Kitchen Physical Assistance: Bring an adult capable of physically assisting you, in the event you need help. . Blood Pressure Medicine: Take your blood pressure medicine with a sip of water the morning of the procedure. . Insulin: Take only  of your normal insulin dose. . Preventing infections: Shower with an antibacterial soap the morning of your procedure. . Build-up your immune system: Take 1000 mg of Vitamin C with every meal (3 times a day) the day prior to your procedure. . Pregnancy: If you are pregnant, call and cancel the procedure. . Sickness: If you have a cold, fever, or any active infections, call and cancel the procedure. . Arrival: You must be in the facility at least 30 minutes prior to your scheduled procedure. . Children: Do not bring children with you. . Dress appropriately: Bring dark clothing that you would not mind if they get stained. . Valuables: Do not bring any jewelry or valuables. Procedure appointments are reserved for interventional treatments only. Marland Kitchen No Prescription Refills. . No medication changes will be discussed during procedure appointments. . No disability issues will be discussed.

## 2018-05-25 NOTE — Patient Instructions (Signed)
    Stop aspirin 325 mg 7 days prior to procedure and take Aspirin 81 mg instead. Pre procedure instructions given with sedation.        Preparing for Procedure with Sedation Instructions: . Oral Intake: Do not eat or drink anything for at least 8 hours prior to your procedure. . Transportation: Public transportation is not allowed. Bring an adult driver. The driver must be physically present in our waiting room before any procedure can be started. Marland Kitchen Physical Assistance: Bring an adult capable of physically assisting you, in the event you need help. . Blood Pressure Medicine: Take your blood pressure medicine with a sip of water the morning of the procedure. . Insulin: Take only  of your normal insulin dose. . Preventing infections: Shower with an antibacterial soap the morning of your procedure. . Build-up your immune system: Take 1000 mg of Vitamin C with every meal (3 times a day) the day prior to your procedure. . Pregnancy: If you are pregnant, call and cancel the procedure. . Sickness: If you have a cold, fever, or any active infections, call and cancel the procedure. . Arrival: You must be in the facility at least 30 minutes prior to your scheduled procedure. . Children: Do not bring children with you. . Dress appropriately: Bring dark clothing that you would not mind if they get stained. . Valuables: Do not bring any jewelry or valuables. Procedure appointments are reserved for interventional treatments only. Marland Kitchen No Prescription Refills. . No medication changes will be discussed during procedure appointments. . No disability issues will be discussed.

## 2018-06-08 ENCOUNTER — Ambulatory Visit
Payer: Medicare HMO | Attending: Student in an Organized Health Care Education/Training Program | Admitting: Student in an Organized Health Care Education/Training Program

## 2018-06-08 ENCOUNTER — Encounter: Payer: Self-pay | Admitting: Student in an Organized Health Care Education/Training Program

## 2018-06-08 ENCOUNTER — Other Ambulatory Visit: Payer: Self-pay

## 2018-06-08 VITALS — BP 176/85 | HR 89 | Temp 97.8°F | Resp 16 | Ht 64.0 in | Wt 177.0 lb

## 2018-06-08 DIAGNOSIS — Z87891 Personal history of nicotine dependence: Secondary | ICD-10-CM | POA: Diagnosis not present

## 2018-06-08 DIAGNOSIS — E1122 Type 2 diabetes mellitus with diabetic chronic kidney disease: Secondary | ICD-10-CM | POA: Diagnosis not present

## 2018-06-08 DIAGNOSIS — Z8673 Personal history of transient ischemic attack (TIA), and cerebral infarction without residual deficits: Secondary | ICD-10-CM | POA: Insufficient documentation

## 2018-06-08 DIAGNOSIS — Z79890 Hormone replacement therapy (postmenopausal): Secondary | ICD-10-CM | POA: Insufficient documentation

## 2018-06-08 DIAGNOSIS — G43909 Migraine, unspecified, not intractable, without status migrainosus: Secondary | ICD-10-CM | POA: Diagnosis not present

## 2018-06-08 DIAGNOSIS — M545 Low back pain: Secondary | ICD-10-CM | POA: Diagnosis present

## 2018-06-08 DIAGNOSIS — F329 Major depressive disorder, single episode, unspecified: Secondary | ICD-10-CM | POA: Diagnosis not present

## 2018-06-08 DIAGNOSIS — E039 Hypothyroidism, unspecified: Secondary | ICD-10-CM | POA: Insufficient documentation

## 2018-06-08 DIAGNOSIS — M810 Age-related osteoporosis without current pathological fracture: Secondary | ICD-10-CM | POA: Insufficient documentation

## 2018-06-08 DIAGNOSIS — F5104 Psychophysiologic insomnia: Secondary | ICD-10-CM | POA: Insufficient documentation

## 2018-06-08 DIAGNOSIS — M533 Sacrococcygeal disorders, not elsewhere classified: Secondary | ICD-10-CM | POA: Insufficient documentation

## 2018-06-08 DIAGNOSIS — M1990 Unspecified osteoarthritis, unspecified site: Secondary | ICD-10-CM | POA: Diagnosis not present

## 2018-06-08 DIAGNOSIS — Z86010 Personal history of colonic polyps: Secondary | ICD-10-CM | POA: Insufficient documentation

## 2018-06-08 DIAGNOSIS — N183 Chronic kidney disease, stage 3 (moderate): Secondary | ICD-10-CM | POA: Insufficient documentation

## 2018-06-08 DIAGNOSIS — G894 Chronic pain syndrome: Secondary | ICD-10-CM | POA: Diagnosis not present

## 2018-06-08 DIAGNOSIS — Z79899 Other long term (current) drug therapy: Secondary | ICD-10-CM | POA: Diagnosis not present

## 2018-06-08 DIAGNOSIS — I701 Atherosclerosis of renal artery: Secondary | ICD-10-CM | POA: Insufficient documentation

## 2018-06-08 DIAGNOSIS — Z8249 Family history of ischemic heart disease and other diseases of the circulatory system: Secondary | ICD-10-CM | POA: Diagnosis not present

## 2018-06-08 DIAGNOSIS — Z7951 Long term (current) use of inhaled steroids: Secondary | ICD-10-CM | POA: Diagnosis not present

## 2018-06-08 DIAGNOSIS — I2510 Atherosclerotic heart disease of native coronary artery without angina pectoris: Secondary | ICD-10-CM | POA: Diagnosis not present

## 2018-06-08 DIAGNOSIS — C50212 Malignant neoplasm of upper-inner quadrant of left female breast: Secondary | ICD-10-CM | POA: Insufficient documentation

## 2018-06-08 DIAGNOSIS — F419 Anxiety disorder, unspecified: Secondary | ICD-10-CM | POA: Diagnosis not present

## 2018-06-08 DIAGNOSIS — I700 Atherosclerosis of aorta: Secondary | ICD-10-CM | POA: Diagnosis not present

## 2018-06-08 DIAGNOSIS — I129 Hypertensive chronic kidney disease with stage 1 through stage 4 chronic kidney disease, or unspecified chronic kidney disease: Secondary | ICD-10-CM | POA: Diagnosis not present

## 2018-06-08 DIAGNOSIS — E785 Hyperlipidemia, unspecified: Secondary | ICD-10-CM | POA: Insufficient documentation

## 2018-06-08 DIAGNOSIS — Z7982 Long term (current) use of aspirin: Secondary | ICD-10-CM | POA: Insufficient documentation

## 2018-06-08 DIAGNOSIS — J449 Chronic obstructive pulmonary disease, unspecified: Secondary | ICD-10-CM | POA: Insufficient documentation

## 2018-06-08 DIAGNOSIS — M47816 Spondylosis without myelopathy or radiculopathy, lumbar region: Secondary | ICD-10-CM | POA: Diagnosis not present

## 2018-06-08 DIAGNOSIS — K219 Gastro-esophageal reflux disease without esophagitis: Secondary | ICD-10-CM | POA: Insufficient documentation

## 2018-06-08 DIAGNOSIS — E7800 Pure hypercholesterolemia, unspecified: Secondary | ICD-10-CM | POA: Insufficient documentation

## 2018-06-08 MED ORDER — ORPHENADRINE CITRATE 30 MG/ML IJ SOLN
30.0000 mg | Freq: Once | INTRAMUSCULAR | Status: AC
  Administered 2018-06-08: 30 mg via INTRAMUSCULAR
  Filled 2018-06-08: qty 2

## 2018-06-08 MED ORDER — KETOROLAC TROMETHAMINE 30 MG/ML IJ SOLN
30.0000 mg | Freq: Once | INTRAMUSCULAR | Status: AC
  Administered 2018-06-08: 30 mg via INTRAMUSCULAR
  Filled 2018-06-08: qty 1

## 2018-06-08 NOTE — Patient Instructions (Signed)
GENERAL RISKS AND COMPLICATIONS  What are the risk, side effects and possible complications? Generally speaking, most procedures are safe.  However, with any procedure there are risks, side effects, and the possibility of complications.  The risks and complications are dependent upon the sites that are lesioned, or the type of nerve block to be performed.  The closer the procedure is to the spine, the more serious the risks are.  Great care is taken when placing the radio frequency needles, block needles or lesioning probes, but sometimes complications can occur. 1. Infection: Any time there is an injection through the skin, there is a risk of infection.  This is why sterile conditions are used for these blocks.  There are four possible types of infection. 1. Localized skin infection. 2. Central Nervous System Infection-This can be in the form of Meningitis, which can be deadly. 3. Epidural Infections-This can be in the form of an epidural abscess, which can cause pressure inside of the spine, causing compression of the spinal cord with subsequent paralysis. This would require an emergency surgery to decompress, and there are no guarantees that the patient would recover from the paralysis. 4. Discitis-This is an infection of the intervertebral discs.  It occurs in about 1% of discography procedures.  It is difficult to treat and it may lead to surgery.        2. Pain: the needles have to go through skin and soft tissues, will cause soreness.       3. Damage to internal structures:  The nerves to be lesioned may be near blood vessels or    other nerves which can be potentially damaged.       4. Bleeding: Bleeding is more common if the patient is taking blood thinners such as  aspirin, Coumadin, Ticiid, Plavix, etc., or if he/she have some genetic predisposition  such as hemophilia. Bleeding into the spinal canal can cause compression of the spinal  cord with subsequent paralysis.  This would require an  emergency surgery to  decompress and there are no guarantees that the patient would recover from the  paralysis.       5. Pneumothorax:  Puncturing of a lung is a possibility, every time a needle is introduced in  the area of the chest or upper back.  Pneumothorax refers to free air around the  collapsed lung(s), inside of the thoracic cavity (chest cavity).  Another two possible  complications related to a similar event would include: Hemothorax and Chylothorax.   These are variations of the Pneumothorax, where instead of air around the collapsed  lung(s), you may have blood or chyle, respectively.       6. Spinal headaches: They may occur with any procedures in the area of the spine.       7. Persistent CSF (Cerebro-Spinal Fluid) leakage: This is a rare problem, but may occur  with prolonged intrathecal or epidural catheters either due to the formation of a fistulous  track or a dural tear.       8. Nerve damage: By working so close to the spinal cord, there is always a possibility of  nerve damage, which could be as serious as a permanent spinal cord injury with  paralysis.       9. Death:  Although rare, severe deadly allergic reactions known as "Anaphylactic  reaction" can occur to any of the medications used.      10. Worsening of the symptoms:  We can always make thing worse.    What are the chances of something like this happening? Chances of any of this occuring are extremely low.  By statistics, you have more of a chance of getting killed in a motor vehicle accident: while driving to the hospital than any of the above occurring .  Nevertheless, you should be aware that they are possibilities.  In general, it is similar to taking a shower.  Everybody knows that you can slip, hit your head and get killed.  Does that mean that you should not shower again?  Nevertheless always keep in mind that statistics do not mean anything if you happen to be on the wrong side of them.  Even if a procedure has a 1  (one) in a 1,000,000 (million) chance of going wrong, it you happen to be that one..Also, keep in mind that by statistics, you have more of a chance of having something go wrong when taking medications.  Who should not have this procedure? If you are on a blood thinning medication (e.g. Coumadin, Plavix, see list of "Blood Thinners"), or if you have an active infection going on, you should not have the procedure.  If you are taking any blood thinners, please inform your physician.  How should I prepare for this procedure?  Do not eat or drink anything at least six hours prior to the procedure.  Bring a driver with you .  It cannot be a taxi.  Come accompanied by an adult that can drive you back, and that is strong enough to help you if your legs get weak or numb from the local anesthetic.  Take all of your medicines the morning of the procedure with just enough water to swallow them.  If you have diabetes, make sure that you are scheduled to have your procedure done first thing in the morning, whenever possible.  If you have diabetes, take only half of your insulin dose and notify our nurse that you have done so as soon as you arrive at the clinic.  If you are diabetic, but only take blood sugar pills (oral hypoglycemic), then do not take them on the morning of your procedure.  You may take them after you have had the procedure.  Do not take aspirin or any aspirin-containing medications, at least eleven (11) days prior to the procedure.  They may prolong bleeding.  Wear loose fitting clothing that may be easy to take off and that you would not mind if it got stained with Betadine or blood.  Do not wear any jewelry or perfume  Remove any nail coloring.  It will interfere with some of our monitoring equipment.  NOTE: Remember that this is not meant to be interpreted as a complete list of all possible complications.  Unforeseen problems may occur.  BLOOD THINNERS The following drugs  contain aspirin or other products, which can cause increased bleeding during surgery and should not be taken for 2 weeks prior to and 1 week after surgery.  If you should need take something for relief of minor pain, you may take acetaminophen which is found in Tylenol,m Datril, Anacin-3 and Panadol. It is not blood thinner. The products listed below are.  Do not take any of the products listed below in addition to any listed on your instruction sheet.  A.P.C or A.P.C with Codeine Codeine Phosphate Capsules #3 Ibuprofen Ridaura  ABC compound Congesprin Imuran rimadil  Advil Cope Indocin Robaxisal  Alka-Seltzer Effervescent Pain Reliever and Antacid Coricidin or Coricidin-D  Indomethacin Rufen    Alka-Seltzer plus Cold Medicine Cosprin Ketoprofen S-A-C Tablets  Anacin Analgesic Tablets or Capsules Coumadin Korlgesic Salflex  Anacin Extra Strength Analgesic tablets or capsules CP-2 Tablets Lanoril Salicylate  Anaprox Cuprimine Capsules Levenox Salocol  Anexsia-D Dalteparin Magan Salsalate  Anodynos Darvon compound Magnesium Salicylate Sine-off  Ansaid Dasin Capsules Magsal Sodium Salicylate  Anturane Depen Capsules Marnal Soma  APF Arthritis pain formula Dewitt's Pills Measurin Stanback  Argesic Dia-Gesic Meclofenamic Sulfinpyrazone  Arthritis Bayer Timed Release Aspirin Diclofenac Meclomen Sulindac  Arthritis pain formula Anacin Dicumarol Medipren Supac  Analgesic (Safety coated) Arthralgen Diffunasal Mefanamic Suprofen  Arthritis Strength Bufferin Dihydrocodeine Mepro Compound Suprol  Arthropan liquid Dopirydamole Methcarbomol with Aspirin Synalgos  ASA tablets/Enseals Disalcid Micrainin Tagament  Ascriptin Doan's Midol Talwin  Ascriptin A/D Dolene Mobidin Tanderil  Ascriptin Extra Strength Dolobid Moblgesic Ticlid  Ascriptin with Codeine Doloprin or Doloprin with Codeine Momentum Tolectin  Asperbuf Duoprin Mono-gesic Trendar  Aspergum Duradyne Motrin or Motrin IB Triminicin  Aspirin  plain, buffered or enteric coated Durasal Myochrisine Trigesic  Aspirin Suppositories Easprin Nalfon Trillsate  Aspirin with Codeine Ecotrin Regular or Extra Strength Naprosyn Uracel  Atromid-S Efficin Naproxen Ursinus  Auranofin Capsules Elmiron Neocylate Vanquish  Axotal Emagrin Norgesic Verin  Azathioprine Empirin or Empirin with Codeine Normiflo Vitamin E  Azolid Emprazil Nuprin Voltaren  Bayer Aspirin plain, buffered or children's or timed BC Tablets or powders Encaprin Orgaran Warfarin Sodium  Buff-a-Comp Enoxaparin Orudis Zorpin  Buff-a-Comp with Codeine Equegesic Os-Cal-Gesic   Buffaprin Excedrin plain, buffered or Extra Strength Oxalid   Bufferin Arthritis Strength Feldene Oxphenbutazone   Bufferin plain or Extra Strength Feldene Capsules Oxycodone with Aspirin   Bufferin with Codeine Fenoprofen Fenoprofen Pabalate or Pabalate-SF   Buffets II Flogesic Panagesic   Buffinol plain or Extra Strength Florinal or Florinal with Codeine Panwarfarin   Buf-Tabs Flurbiprofen Penicillamine   Butalbital Compound Four-way cold tablets Penicillin   Butazolidin Fragmin Pepto-Bismol   Carbenicillin Geminisyn Percodan   Carna Arthritis Reliever Geopen Persantine   Carprofen Gold's salt Persistin   Chloramphenicol Goody's Phenylbutazone   Chloromycetin Haltrain Piroxlcam   Clmetidine heparin Plaquenil   Cllnoril Hyco-pap Ponstel   Clofibrate Hydroxy chloroquine Propoxyphen         Before stopping any of these medications, be sure to consult the physician who ordered them.  Some, such as Coumadin (Warfarin) are ordered to prevent or treat serious conditions such as "deep thrombosis", "pumonary embolisms", and other heart problems.  The amount of time that you may need off of the medication may also vary with the medication and the reason for which you were taking it.  If you are taking any of these medications, please make sure you notify your pain physician before you undergo any  procedures.         Moderate Conscious Sedation, Adult Sedation is the use of medicines to promote relaxation and relieve discomfort and anxiety. Moderate conscious sedation is a type of sedation. Under moderate conscious sedation, you are less alert than normal, but you are still able to respond to instructions, touch, or both. Moderate conscious sedation is used during short medical and dental procedures. It is milder than deep sedation, which is a type of sedation under which you cannot be easily woken up. It is also milder than general anesthesia, which is the use of medicines to make you unconscious. Moderate conscious sedation allows you to return to your regular activities sooner. Tell a health care provider about:  Any allergies you have.  All medicines you are  taking, including vitamins, herbs, eye drops, creams, and over-the-counter medicines.  Use of steroids (by mouth or creams).  Any problems you or family members have had with sedatives and anesthetic medicines.  Any blood disorders you have.  Any surgeries you have had.  Any medical conditions you have, such as sleep apnea.  Whether you are pregnant or may be pregnant.  Any use of cigarettes, alcohol, marijuana, or street drugs. What are the risks? Generally, this is a safe procedure. However, problems may occur, including:  Getting too much medicine (oversedation).  Nausea.  Allergic reaction to medicines.  Trouble breathing. If this happens, a breathing tube may be used to help with breathing. It will be removed when you are awake and breathing on your own.  Heart trouble.  Lung trouble.  What happens before the procedure? Staying hydrated Follow instructions from your health care provider about hydration, which may include:  Up to 2 hours before the procedure - you may continue to drink clear liquids, such as water, clear fruit juice, black coffee, and plain tea.  Eating and drinking  restrictions Follow instructions from your health care provider about eating and drinking, which may include:  8 hours before the procedure - stop eating heavy meals or foods such as meat, fried foods, or fatty foods.  6 hours before the procedure - stop eating light meals or foods, such as toast or cereal.  6 hours before the procedure - stop drinking milk or drinks that contain milk.  2 hours before the procedure - stop drinking clear liquids.  Medicine  Ask your health care provider about:  Changing or stopping your regular medicines. This is especially important if you are taking diabetes medicines or blood thinners.  Taking medicines such as aspirin and ibuprofen. These medicines can thin your blood. Do not take these medicines before your procedure if your health care provider instructs you not to.  Tests and exams  You will have a physical exam.  You may have blood tests done to show: ? How well your kidneys and liver are working. ? How well your blood can clot. General instructions  Plan to have someone take you home from the hospital or clinic.  If you will be going home right after the procedure, plan to have someone with you for 24 hours. What happens during the procedure?  An IV tube will be inserted into one of your veins.  Medicine to help you relax (sedative) will be given through the IV tube.  The medical or dental procedure will be performed. What happens after the procedure?  Your blood pressure, heart rate, breathing rate, and blood oxygen level will be monitored often until the medicines you were given have worn off.  Do not drive for 24 hours. This information is not intended to replace advice given to you by your health care provider. Make sure you discuss any questions you have with your health care provider. Document Released: 05/13/2001 Document Revised: 01/22/2016 Document Reviewed: 12/08/2015 Elsevier Interactive Patient Education  2018  [place]   Facet Blocks Patient Information  Description: The facets are joints in the spine between the vertebrae.  Like any joints in the body, facets can become irritated and painful.  Arthritis can also effect the facets.  By injecting steroids and local anesthetic in and around these joints, we can temporarily block the nerve supply to them.  Steroids act directly on irritated nerves and tissues to reduce selling and inflammation which often leads to  decreased pain.  Facet blocks may be done anywhere along the spine from the neck to the low back depending upon the location of your pain.   After numbing the skin with local anesthetic (like Novocaine), a small needle is passed onto the facet joints under x-ray guidance.  You may experience a sensation of pressure while this is being done.  The entire block usually lasts about 15-25 minutes.   Conditions which may be treated by facet blocks:   Low back/buttock pain  Neck/shoulder pain  Certain types of headaches  Preparation for the injection:  1. Do not eat any solid food or dairy products within 8 hours of your appointment. 2. You may drink clear liquid up to 3 hours before appointment.  Clear liquids include water, black coffee, juice or soda.  No milk or cream please. 3. You may take your regular medication, including pain medications, with a sip of water before your appointment.  Diabetics should hold regular insulin (if taken separately) and take 1/2 normal NPH dose the morning of the procedure.  Carry some sugar containing items with you to your appointment. 4. A driver must accompany you and be prepared to drive you home after your procedure. 5. Bring all your current medications with you. 6. An IV may be inserted and sedation may be given at the discretion of the physician. 7. A blood pressure cuff, EKG and other monitors will often be applied during the procedure.  Some patients may need to have extra oxygen administered  for a short period. 8. You will be asked to provide medical information, including your allergies and medications, prior to the procedure.  We must know immediately if you are taking blood thinners (like Coumadin/Warfarin) or if you are allergic to IV iodine contrast (dye).  We must know if you could possible be pregnant.  Possible side-effects:   Bleeding from needle site  Infection (rare, may require surgery)  Nerve injury (rare)  Numbness & tingling (temporary)  Difficulty urinating (rare, temporary)  Spinal headache (a headache worse with upright posture)  Light-headedness (temporary)  Pain at injection site (serveral days)  Decreased blood pressure (rare, temporary)  Weakness in arm/leg (temporary)  Pressure sensation in back/neck (temporary)   Call if you experience:   Fever/chills associated with headache or increased back/neck pain  Headache worsened by an upright position  New onset, weakness or numbness of an extremity below the injection site  Hives or difficulty breathing (go to the emergency room)  Inflammation or drainage at the injection site(s)  Severe back/neck pain greater than usual  New symptoms which are concerning to you  Please note:  Although the local anesthetic injected can often make your back or neck feel good for several hours after the injection, the pain will likely return. It takes 3-7 days for steroids to work.  You may not notice any pain relief for at least one week.  If effective, we will often do a series of 2-3 injections spaced 3-6 weeks apart to maximally decrease your pain.  After the initial series, you may be a candidate for a more permanent nerve block of the facets.  If you have any questions, please call #[phone]) [facility]

## 2018-06-08 NOTE — Progress Notes (Signed)
Safety precautions to be maintained throughout the outpatient stay will include: orient to surroundings, keep bed in low position, maintain call bell within reach at all times, provide assistance with transfer out of bed and ambulation.  

## 2018-06-08 NOTE — Progress Notes (Signed)
Patient's Name: Tamara Finley  MRN: [id number]  Referring Provider: Ezequiel Kayser, MD  DOB: [birth year]  PCP: Ezequiel Kayser, MD  DOS: 06/08/2018  Note by: Gillis Santa, MD  Service setting: Ambulatory outpatient  Specialty: Interventional Pain Management  Location: [facility] (AMB) Pain Management Facility    Patient type: Established   Primary Reason(s) for Visit: Evaluation of chronic illnesses with exacerbation, or progression (Level of risk: moderate) CC: Back Pain (lower) and Leg Pain (bilaterally)  HPI  Tamara Finley is a 77 y.o. year old, female patient, who comes today for a follow-up evaluation. She has TIA (transient ischemic attack); Renovascular hypertension, malignant; Malignant hypertension; Carotid stenosis; Adenomatous colon polyp; Anxiety and depression; Breast calcification, left; Carotid atherosclerosis, bilateral; Chronic insomnia; COPD (chronic obstructive pulmonary disease) (HCC); CRI (chronic renal insufficiency), stage 3 (moderate) ([place]); Elevated blood sugar; GERD (gastroesophageal reflux disease); Heart palpitations; History of AAA (abdominal aortic aneurysm) repair; History of TIA (transient ischemic attack); Hyperlipidemia, unspecified; Hypertension; Hypothyroidism; Left breast mass; Low back pain radiating to both legs; Lumbar stenosis; Migraine headache; Osteoarthritis; Osteoporosis, postmenopausal; Perennial allergic rhinitis; Peripheral arterial disease ([place]); Primary localized osteoarthrosis, pelvic region and thigh; Primary osteoarthritis of right hip; Right hip pain; Sacral back pain; SOB (shortness of breath); Stenosis of left subclavian artery ([place]); Type 2 diabetes mellitus with vascular disease ([place]); ERRONEOUS ENCOUNTER--DISREGARD; Primary cancer of upper inner quadrant of left female breast ([place]); and Renal artery stenosis ([place]) on their problem list. Tamara Finley was last seen on 05/25/2018. Her primarily concern today is the Back Pain (lower) and Leg Pain (bilaterally)  Pain  Assessment: Location: Lower Back Radiating: through buttocks and down legs to ankles bilaterally; worse on right side Onset: More than a month ago Duration: Chronic pain Quality: Constant, Aching, Dull Severity: 6 /10 (subjective, self-reported pain score)  Note: Reported level is inconsistent with clinical observations. Clinically the patient looks like a 3/10 A 3/10 is viewed as "Moderate" and described as significantly interfering with activities of daily living (ADL). It becomes difficult to feed, bathe, get dressed, get on and off the toilet or to perform personal hygiene functions. Difficult to get in and out of bed or a chair without assistance. Very distracting. With effort, it can be ignored when deeply involved in activities. Information on the proper use of the pain scale provided to the patient today. When using our objective Pain Scale, levels between 6 and 10/10 are said to belong in an emergency room, as it progressively worsens from a 6/10, described as severely limiting, requiring emergency care not usually available at an outpatient pain management facility. At a 6/10 level, communication becomes difficult and requires great effort. Assistance to reach the emergency department may be required. Facial flushing and profuse sweating along with potentially dangerous increases in heart rate and blood pressure will be evident. Effect on ADL: limits activities Timing: Constant Modifying factors: rest BP: (!) 176/85  HR: 89  Further details on both, my assessment(s), as well as the proposed treatment plan, please see below.  Patient follows up endorsing worsening axial low back pain.  This pain is worse with standing up straight, lumbar extension and lateral rotation.  Pain radiates to superior buttocks.  This is consistent with a facet referral pattern.  Laboratory Chemistry  Inflammation Markers (CRP: Acute Phase) (ESR: Chronic Phase) Lab Results  Component Value Date   ESRSEDRATE  23 10/12/2015  Rheumatology Markers No results found for: RF, ANA, LABURIC, URICUR, LYMEIGGIGMAB, LYMEABIGMQN, HLAB27                      Renal Function Markers Lab Results  Component Value Date   BUN 9 01/01/2016   CREATININE 1.03 (H) 01/01/2016   GFRAA >60 01/01/2016   GFRNONAA 53 (L) 01/01/2016                             Hepatic Function Markers Lab Results  Component Value Date   AST 28 10/17/2015   ALT 17 10/17/2015   ALBUMIN 3.7 10/17/2015   ALKPHOS 64 10/17/2015                        Electrolytes Lab Results  Component Value Date   NA 139 01/01/2016   K 3.5 01/01/2016   CL 106 01/01/2016   CALCIUM 8.2 (L) 01/01/2016                        Neuropathy Markers Lab Results  Component Value Date   HGBA1C 5.3 10/17/2015                        CNS Tests No results found for: COLORCSF, APPEARCSF, RBCCOUNTCSF, WBCCSF, POLYSCSF, LYMPHSCSF, EOSCSF, PROTEINCSF, GLUCCSF, JCVIRUS, CSFOLI, IGGCSF                      Bone Pathology Markers No results found for: VD25OH, VD125OH2TOT, [id number], [id number], 25OHVITD1, 25OHVITD2, 25OHVITD3, TESTOFREE, TESTOSTERONE                       Coagulation Parameters Lab Results  Component Value Date   INR 1.04 10/17/2015   LABPROT 13.8 10/17/2015   APTT 29 10/17/2015   PLT 241 01/01/2016                        Cardiovascular Markers Lab Results  Component Value Date   TROPONINI 0.05 (H) 10/18/2015   HGB 11.2 (L) 01/01/2016   HCT 33.9 (L) 01/01/2016                         CA Markers No results found for: CEA, CA125, LABCA2                      Note: Lab results reviewed.  Recent Diagnostic Imaging Review  Lumbar DG Bending views:  Results for orders placed during the hospital encounter of 05/18/18  DG Lumbar Spine Complete W/Bend   Narrative CLINICAL DATA:  Lumbar degenerative disc disease.  EXAM: LUMBAR SPINE - COMPLETE WITH BENDING VIEWS  COMPARISON:  MRI of the lumbar spine  11/26/2012.  FINDINGS: Five non rib-bearing lumbar type vertebral bodies are present.  The inferior endplate compression fracture at L3 results in 25% loss of height relative to the adjacent L2 vertebral body. There is some retropulsion of bone at the L3-4 level. This likely results in significant central canal stenosis. Slight retrolisthesis at L3-4 is new, measuring up to 3 mm. AP alignment is otherwise anatomic. There straightening of the normal lumbar lordosis.  There is no abnormal motion during flexion or extension.  Chronic endplate changes and spurring are present at L4-5 and L5-S1. Facet hypertrophy is worse at  L5-S1. Patient status post L4 and L5 laminectomies.  Partial fusion is noted at the SI joints bilaterally.  Atherosclerotic changes are present in the aorta. A left renal stent is noted.  IMPRESSION: 1. Laminectomies at L4 and L5. 2. Adjacent level compression fracture along the inferior endplate of L3 is likely remote. 3. Retropulsed bone at L3-4 likely contributes to significant central canal stenosis, new from prior exams. 4. No abnormal motion with flexion or extension. No significant listhesis of greater than 4 mm. 5. Facet hypertrophy is greatest at L5-S1 and contributes to foraminal narrowing.   Electronically Signed   By: San Morelle M.D.   On: 05/19/2018 08:33      Sacroiliac Joint Imaging: Sacroiliac Joint DG:  Results for orders placed during the hospital encounter of 05/18/18  DG Si Joints   Narrative CLINICAL DATA:  Low back pain and hip pain  EXAM: BILATERAL SACROILIAC JOINTS - 3+ VIEW  COMPARISON:  MRI of the pelvis dated 12/22/2013.  FINDINGS: No significant erosions or sclerosis along the SI joints.  There is deformity of the left inferior pubic ramus compatible with fracture. Given the marginal sclerosis is probably subacute. This also implies a potential fracture of the superior pubic ramus although this is less  well-defined.  Bony demineralization is present. Preserved articular space in the hips.  Lower lumbar degenerative disc disease with loss of disc height at L4-5 and L5-S1.  IMPRESSION: 1. Probably subacute fracture of the left inferior pubic ramus. Superior pubic ramus involvement is implied but poorly seen. There is some potential mild superior surface irregularity of the pubis on the left which may also indicate fracture. Commonly such fracture pattern would also include a fracture elsewhere in the pelvis, such as involving the sacrum, but no definite sacral fracture is appreciated on today's conventional radiographs. These fractures could be further assessed with CT or MRI if clinically warranted. 2. Lower lumbar degenerative disc disease. 3. No significant degree of sacroiliitis ([place] criteria grade 0). 4. Bony demineralization.   Electronically Signed   By: Van Clines M.D.   On: 05/19/2018 08:35    Hip-R DG 2-3 views:  Results for orders placed during the hospital encounter of 05/18/18  DG HIP UNILAT W OR W/O PELVIS 2-3 VIEWS RIGHT   Narrative CLINICAL DATA:  Hip pain  EXAM: DG HIP (WITH OR WITHOUT PELVIS) 2-3V RIGHT  COMPARISON:  None.  FINDINGS: Moderate lateral spurring of the femoral head and of the acetabulum. Preserved articular cartilage space.  Lower lumbar degenerative disc disease with loss of disc height at L5-S1.  Deformity left inferior pubic ramus and probably the left pubic body suggesting subacute fractures. No discrete fracture involving the right hip.  IMPRESSION: 1. Deformity in the left pubic body and left inferior pubic ramus compatible with fractures which could be subacute or chronic. There is some callus formation in this vicinity. 2. Moderate degenerative spurring of the right femoral head and acetabular margin without significant loss of articular space.   Electronically Signed   By: Van Clines M.D.   On:  05/19/2018 08:37    Hip-L DG 2-3 views:  Results for orders placed during the hospital encounter of 05/18/18  DG HIP UNILAT W OR W/O PELVIS 2-3 VIEWS LEFT   Narrative CLINICAL DATA:  Left hip pain.  EXAM: DG HIP (WITH OR WITHOUT PELVIS) 2-3V LEFT  COMPARISON:  None.  FINDINGS: There is no evidence of hip fracture or dislocation. There is no evidence of hip arthropathy.  Old fracture deformity is seen involving the left inferior pubic ramus. Generalized osteopenia also noted.  IMPRESSION: No acute findings.  Old fracture deformity of left inferior pubic ramus.  Osteopenia.   Electronically Signed   By: Earle Gell M.D.   On: 05/19/2018 08:30    Complexity Note: Imaging results reviewed. Results shared with Tamara Finley, using Layman's terms.                         Meds   Current Outpatient Medications:  .  acetaminophen (TYLENOL) 500 MG tablet, Take by mouth., Disp: , Rfl:  .  albuterol (PROVENTIL HFA;VENTOLIN HFA) 108 (90 BASE) MCG/ACT inhaler, Inhale 2 puffs into the lungs every 6 (six) hours as needed for wheezing or shortness of breath. Reported on 12/31/2015, Disp: , Rfl:  .  amLODipine (NORVASC) 5 MG tablet, Take 5 mg by mouth daily., Disp: , Rfl:  .  anastrozole (ARIMIDEX) 1 MG tablet, Take 1 mg by mouth daily. , Disp: , Rfl:  .  Ascorbic Acid (VITAMIN C) POWD, Take 1 application by mouth daily. , Disp: , Rfl:  .  aspirin 81 MG chewable tablet, Chew 81 mg by mouth daily., Disp: , Rfl:  .  atorvastatin (LIPITOR) 40 MG tablet, Take 40 mg by mouth daily at 6 PM. , Disp: , Rfl:  .  Calcium Carbonate-Vitamin D (CALCIUM 600+D) 600-400 MG-UNIT tablet, Take 1 tablet by mouth 2 (two) times daily with a meal., Disp: , Rfl:  .  cholecalciferol (VITAMIN D) 1000 units tablet, Take 2,000 Units by mouth daily. Reported on 12/31/2015, Disp: , Rfl:  .  felodipine (PLENDIL) 10 MG 24 hr tablet, Take 10 mg by mouth daily. Reported on 12/31/2015, Disp: , Rfl:  .  fluticasone (FLONASE) 50  MCG/ACT nasal spray, Place 1-2 sprays into both nostrils daily as needed for rhinitis., Disp: , Rfl:  .  furosemide (LASIX) 40 MG tablet, Take 1 tablet by mouth 1 day or 1 dose., Disp: , Rfl:  .  gabapentin (NEURONTIN) 300 MG capsule, Take 1 capsule by mouth 1 day or 1 dose., Disp: , Rfl:  .  gabapentin (NEURONTIN) 300 MG capsule, Take 300 mg by mouth 3 (three) times daily., Disp: , Rfl:  .  GAVILYTE-N WITH FLAVOR PACK 420 g solution, , Disp: , Rfl:  .  hydrALAZINE (APRESOLINE) 25 MG tablet, Take 25 mg by mouth 3 (three) times daily., Disp: , Rfl:  .  hydrochlorothiazide (HYDRODIURIL) 25 MG tablet, Take 25 mg by mouth daily., Disp: , Rfl:  .  levothyroxine (SYNTHROID, LEVOTHROID) 88 MCG tablet, Take 88 mcg by mouth daily before breakfast., Disp: , Rfl:  .  magnesium 30 MG tablet, Take 250 mg by mouth daily., Disp: , Rfl:  .  metoprolol succinate (TOPROL-XL) 50 MG 24 hr tablet, Take 50 mg by mouth daily. Reported on 12/31/2015, Disp: , Rfl:  .  mirtazapine (REMERON) 15 MG tablet, TAKE 1 TABLET BY MOUTH EVERY DAY AT NIGHT, Disp: , Rfl: 3 .  omega-3 acid ethyl esters (LOVAZA) 1 g capsule, Take 1 g by mouth daily., Disp: , Rfl:  .  oxyCODONE (OXY IR/ROXICODONE) 5 MG immediate release tablet, Take by mouth., Disp: , Rfl:  .  pantoprazole (PROTONIX) 40 MG tablet, Take 40 mg by mouth daily., Disp: , Rfl:  .  PEG 3350-KCl-NaBcb-NaCl-NaSulf (PEG-3350/ELECTROLYTES PO), Take 4,000 mLs by mouth as directed., Disp: , Rfl:  .  polyethylene glycol powder (GLYCOLAX/MIRALAX) powder, Take 0.5  Containers by mouth as needed. , Disp: , Rfl:  .  potassium chloride SA (K-DUR,KLOR-CON) 20 MEQ tablet, Take 20 mEq by mouth as directed., Disp: , Rfl:  .  simvastatin (ZOCOR) 80 MG tablet, Take 80 mg by mouth at bedtime. Reported on 12/31/2015, Disp: , Rfl:  .  sodium chloride (OCEAN) 0.65 % SOLN nasal spray, Place 1-2 sprays into both nostrils as needed for congestion., Disp: , Rfl:  .  traMADol (ULTRAM) 50 MG tablet, Take 50 mg  by mouth every 6 (six) hours as needed for moderate pain. , Disp: , Rfl:  .  vitamin B-12 (CYANOCOBALAMIN) 1000 MCG tablet, Take 1 tablet by mouth 1 day or 1 dose., Disp: , Rfl:  .  Vitamin D, Ergocalciferol, (DRISDOL) 50000 units CAPS capsule, Take 1 capsule by mouth once a week., Disp: , Rfl:  .  clopidogrel (PLAVIX) 75 MG tablet, Take 75 mg by mouth daily., Disp: , Rfl:   Current Facility-Administered Medications:  .  ketorolac (TORADOL) 30 MG/ML injection 30 mg, 30 mg, Intramuscular, Once, Joseeduardo Brix, MD .  orphenadrine (NORFLEX) injection 30 mg, 30 mg, Intramuscular, Once, Ennis Heavner, MD  ROS  Constitutional: Denies any fever or chills Gastrointestinal: No reported hemesis, hematochezia, vomiting, or acute GI distress Musculoskeletal: Denies any acute onset joint swelling, redness, loss of ROM, or weakness Neurological: No reported episodes of acute onset apraxia, aphasia, dysarthria, agnosia, amnesia, paralysis, loss of coordination, or loss of consciousness  Allergies  Tamara Finley is allergic to benadryl [diphenhydramine hcl (sleep)]; diphenhydramine hcl; and alprazolam.  PFSH  Drug: Tamara Finley  reports that she does not use drugs. Alcohol:  reports that she does not drink alcohol. Tobacco:  reports that she quit smoking about 5 years ago. Her smoking use included cigarettes. She has a 100.00 pack-year smoking history. She has never used smokeless tobacco. Medical:  has a past medical history of Abdominal aortic aneurysm (AAA) ([place]) (2000's), Age related entropion, unspecified laterality, Anxiety, Anxiety and depression, Bilateral renal artery stenosis ([place]), Breast calcification, left, Cancer ([place]) (10/20/2016), Carotid artery thrombosis, bilateral, Carotid stenosis, Chronic bronchitis (HCC), Chronic insomnia, COPD (chronic obstructive pulmonary disease) ([place]), Coronary artery disease, CRI (chronic renal insufficiency), stage 3 (moderate) ([place]), Depression, Diabetes mellitus  without complication ([place]), GERD (gastroesophageal reflux disease), Heart palpitations, History of AAA (abdominal aortic aneurysm) repair, History of colon polyps, History of TIA (transient ischemic attack), Hypercholesterolemia, Hypertension, Hypothyroidism, Migraine headache, Osteoarthritis, Osteoporosis, Peripheral arterial disease ([place]), Pulmonary scarring, Renal disorder, SOB (shortness of breath), Stenosis of left subclavian artery ([place]), Stroke ([place]), Thyroid disease, TIA (transient ischemic attack), and Type 2 diabetes mellitus with vascular disease ([place]). Surgical: Tamara Finley  has a past surgical history that includes Cardiac surgery; Abdominal aortic aneurysm repair; Cardiac catheterization (N/A, 12/05/2015); Cardiac catheterization (Left, 12/31/2015); Breast excisional biopsy (Left, 2010); Breast excisional biopsy (Left, 07/01/2006); Breast biopsy (Left, 09/03/2016); Breast biopsy (Left, 09/25/2016); Bronchoscopy; CAROTID PTA/STENT INTERVENTION; Colonoscopy; Eye surgery; Laminectomy (07/25/2013); Back surgery; Mediastinoscopy; Mastectomy; Thoracoscopy (12/16/2016); and Colonoscopy with propofol (N/A, 06/15/2017). Family: family history includes CVA in her mother; Cancer in her brother; Coronary artery disease in her father; Dementia in her father; Diabetes in her brother; Epilepsy in her mother; Hypertension in her father and mother; Ovarian cancer in her sister; Stroke in her father and mother.  Constitutional Exam  General appearance: Well nourished, well developed, and well hydrated. In no apparent acute distress Vitals:   06/08/18 1005  BP: (!) 176/85  Pulse: 89  Resp: 16  Temp: 97.8 F (  36.6 C)  TempSrc: Oral  SpO2: 100%  Weight: 177 lb (80.3 kg)  Height: 5' 4" (1.626 m)   BMI Assessment: Estimated body mass index is 30.38 kg/m as calculated from the following:   Height as of this encounter: 5' 4" (1.626 m).   Weight as of this encounter: 177 lb (80.3 kg).  BMI interpretation  table: BMI level Category Range association with higher incidence of chronic pain  <18 kg/m2 Underweight   18.5-24.9 kg/m2 Ideal body weight   25-29.9 kg/m2 Overweight Increased incidence by 20%  30-34.9 kg/m2 Obese (Class I) Increased incidence by 68%  35-39.9 kg/m2 Severe obesity (Class II) Increased incidence by 136%  >40 kg/m2 Extreme obesity (Class III) Increased incidence by 254%   Patient's current BMI Ideal Body weight  Body mass index is 30.38 kg/m. Ideal body weight: 54.7 kg (120 lb 9.5 oz) Adjusted ideal body weight: 64.9 kg (143 lb 2.5 oz)   BMI Readings from Last 4 Encounters:  06/08/18 30.38 kg/m  05/25/18 30.65 kg/m  04/26/18 30.49 kg/m  04/13/18 30.73 kg/m   Wt Readings from Last 4 Encounters:  06/08/18 177 lb (80.3 kg)  05/25/18 173 lb (78.5 kg)  04/26/18 179 lb (81.2 kg)  04/13/18 179 lb (81.2 kg)  Psych/Mental status: Alert, oriented x 3 (person, place, & time)       Eyes: PERLA Respiratory: No evidence of acute respiratory distress  Cervical Spine Area Exam  Skin & Axial Inspection: No masses, redness, edema, swelling, or associated skin lesions Alignment: Symmetrical Functional ROM: Unrestricted ROM      Stability: No instability detected Muscle Tone/Strength: Functionally intact. No obvious neuro-muscular anomalies detected. Sensory (Neurological): Unimpaired Palpation: No palpable anomalies              Upper Extremity (UE) Exam    Side: Right upper extremity  Side: Left upper extremity  Skin & Extremity Inspection: Skin color, temperature, and hair growth are WNL. No peripheral edema or cyanosis. No masses, redness, swelling, asymmetry, or associated skin lesions. No contractures.  Skin & Extremity Inspection: Skin color, temperature, and hair growth are WNL. No peripheral edema or cyanosis. No masses, redness, swelling, asymmetry, or associated skin lesions. No contractures.  Functional ROM: Unrestricted ROM          Functional ROM: Unrestricted  ROM          Muscle Tone/Strength: Functionally intact. No obvious neuro-muscular anomalies detected.  Muscle Tone/Strength: Functionally intact. No obvious neuro-muscular anomalies detected.  Sensory (Neurological): Unimpaired          Sensory (Neurological): Unimpaired          Palpation: No palpable anomalies              Palpation: No palpable anomalies              Provocative Test(s):  Phalen's test: deferred Tinel's test: deferred Apley's scratch test (touch opposite shoulder):  Action 1 (Across chest): deferred Action 2 (Overhead): deferred Action 3 (LB reach): deferred   Provocative Test(s):  Phalen's test: deferred Tinel's test: deferred Apley's scratch test (touch opposite shoulder):  Action 1 (Across chest): deferred Action 2 (Overhead): deferred Action 3 (LB reach): deferred    Thoracic Spine Area Exam  Skin & Axial Inspection: No masses, redness, or swelling Alignment: Symmetrical Functional ROM: Unrestricted ROM Stability: No instability detected Muscle Tone/Strength: Functionally intact. No obvious neuro-muscular anomalies detected. Sensory (Neurological): Unimpaired Muscle strength & Tone: No palpable anomalies  Lumbar Spine Area Exam  Skin &  Axial Inspection: Well healed scar from previous spine surgery detected Alignment: Symmetrical Functional ROM: Decreased ROM affecting both sides Stability: No instability detected Muscle Tone/Strength: Functionally intact. No obvious neuro-muscular anomalies detected. Sensory (Neurological): Musculoskeletal pain pattern Palpation: No palpable anomalies       Provocative Tests: Hyperextension/rotation test: (+) bilaterally for facet joint pain. Lumbar quadrant test (Kemp's test): (+) bilaterally for facet joint pain. Lateral bending test: (+) due to pain. Patrick's Maneuver: deferred today                   FABER test: deferred today                   S-I anterior distraction/compression test: deferred today          S-I lateral compression test: deferred today         S-I Thigh-thrust test: deferred today         S-I Gaenslen's test: deferred today          Gait & Posture Assessment  Ambulation: Patient ambulates using a cane Gait: Antalgic gait (limping) Posture: Difficulty standing up straight, due to pain   Lower Extremity Exam    Side: Right lower extremity  Side: Left lower extremity  Stability: No instability observed          Stability: No instability observed          Skin & Extremity Inspection: Skin color, temperature, and hair growth are WNL. No peripheral edema or cyanosis. No masses, redness, swelling, asymmetry, or associated skin lesions. No contractures.  Skin & Extremity Inspection: Skin color, temperature, and hair growth are WNL. No peripheral edema or cyanosis. No masses, redness, swelling, asymmetry, or associated skin lesions. No contractures.  Functional ROM: Unrestricted ROM                  Functional ROM: Unrestricted ROM                  Muscle Tone/Strength: Functionally intact. No obvious neuro-muscular anomalies detected.  Muscle Tone/Strength: Functionally intact. No obvious neuro-muscular anomalies detected.  Sensory (Neurological): Unimpaired  Sensory (Neurological): Unimpaired  Palpation: No palpable anomalies  Palpation: No palpable anomalies   Assessment  Primary Diagnosis & Pertinent Problem List: The primary encounter diagnosis was Lumbar facet arthropathy (L4/L5 and L5/S1). Diagnoses of Lumbar spondylosis and Chronic pain syndrome were also pertinent to this visit.  Status Diagnosis  Worsening Worsening Worsening 1. Lumbar facet arthropathy (L4/L5 and L5/S1)   2. Lumbar spondylosis   3. Chronic pain syndrome      General Recommendations: The pain condition that the patient suffers from is best treated with a multidisciplinary approach that involves an increase in physical activity to prevent de-conditioning and worsening of the pain cycle, as well as  psychological counseling (formal and/or informal) to address the co-morbid psychological affects of pain. Treatment will often involve judicious use of pain medications and interventional procedures to decrease the pain, allowing the patient to participate in the physical activity that will ultimately produce long-lasting pain reductions. The goal of the multidisciplinary approach is to return the patient to a higher level of overall function and to restore their ability to perform activities of daily living.  NASREEN Finley has a history of greater than 3 months of moderate to severe pain which is resulted in functional impairment.  The patient has tried various conservative therapeutic options such as NSAIDs, Tylenol, muscle relaxants, physical therapy which was inadequately effective.  Patient's pain is predominantly axial with physical exam findings suggestive of facet arthropathy.  Patient has pain with lumbar extension and lateral rotation.  Furthermore the patient has MRI radiographic evidence of moderate to severe facet hypertrophy resulting in facet arthropathy referral pattern at L4-L5 and L5-S1.  Lumbar facet medial branch nerve blocks were discussed with the patient.  Risks and benefits were reviewed.  Patient would like to proceed with bilateral L4, L5 medial branch nerve block.  Given the patient's severe pain, we discussed intramuscular Norflex and Toradol today.  Patient's creatinine function from her lab work on 09/15/2017 was within normal limits creatinine equals 1.0.  Patient is not on any other nephrotoxic agents.  I recommended she continue her p.o. fluid intake.  We will plan for intramuscular Norflex and Toradol, 30 mg respectively.  Plan: -IM Norflex and Toradol as below -Lumbar Facet medial branch nerve blocks at L4-L5 and L5-S1 lumbar spondylosis and lumbar facet arthropathy.   Plan of Care  Pharmacotherapy (Medications Ordered): Meds ordered this encounter  Medications  .  orphenadrine (NORFLEX) injection 30 mg  . ketorolac (TORADOL) 30 MG/ML injection 30 mg   Lab-work, procedure(s), and/or referral(s): Orders Placed This Encounter  Procedures  . LUMBAR FACET(MEDIAL BRANCH NERVE BLOCK) MBNB   Provider-requested follow-up: Return in about 2 weeks (around 06/22/2018) for Procedure.  Time Note: Greater than 50% of the 25 minute(s) of face-to-face time spent with Tamara Finley, was spent in counseling/coordination of care regarding: Tamara Finley primary cause of pain, the treatment plan, treatment alternatives, the risks and possible complications of proposed treatment, going over the informed consent, the results, interpretation and significance of  her recent diagnostic interventional treatment(s), realistic expectations and the goals of pain management (increased in functionality).  Future Appointments  Date Time Provider [place]  07/27/2018  9:30 AM [facility] None  07/27/2018 10:30 AM Dew, Erskine Squibb, MD [facility] None  04/15/2019  8:30 AM [facility] None  04/15/2019  9:30 AM Dew, Erskine Squibb, MD [facility] None    Primary Care Physician: Ezequiel Kayser, MD Location: [facility] Note by: Gillis Santa, M.D Date: 06/08/2018; Time: 10:35 AM  There are no Patient Instructions on file for this visit.

## 2018-06-23 ENCOUNTER — Ambulatory Visit
Admission: RE | Admit: 2018-06-23 | Discharge: 2018-06-23 | Disposition: A | Discharge status: Home / Self Care | Payer: Medicare HMO | Source: Ambulatory Visit | Attending: Student in an Organized Health Care Education/Training Program | Admitting: Student in an Organized Health Care Education/Training Program

## 2018-06-23 ENCOUNTER — Encounter: Payer: Self-pay | Admitting: Student in an Organized Health Care Education/Training Program

## 2018-06-23 ENCOUNTER — Other Ambulatory Visit: Payer: Self-pay

## 2018-06-23 ENCOUNTER — Ambulatory Visit (HOSPITAL_BASED_OUTPATIENT_CLINIC_OR_DEPARTMENT_OTHER): Payer: Medicare HMO | Admitting: Student in an Organized Health Care Education/Training Program

## 2018-06-23 VITALS — BP 177/85 | HR 84 | Temp 97.6°F | Resp 14 | Ht 64.0 in | Wt 170.0 lb

## 2018-06-23 DIAGNOSIS — Z79899 Other long term (current) drug therapy: Secondary | ICD-10-CM | POA: Diagnosis not present

## 2018-06-23 DIAGNOSIS — Z7982 Long term (current) use of aspirin: Secondary | ICD-10-CM | POA: Diagnosis not present

## 2018-06-23 DIAGNOSIS — M47816 Spondylosis without myelopathy or radiculopathy, lumbar region: Secondary | ICD-10-CM

## 2018-06-23 DIAGNOSIS — Z79890 Hormone replacement therapy (postmenopausal): Secondary | ICD-10-CM | POA: Diagnosis not present

## 2018-06-23 DIAGNOSIS — M545 Low back pain: Secondary | ICD-10-CM | POA: Diagnosis present

## 2018-06-23 MED ORDER — DEXAMETHASONE SODIUM PHOSPHATE 10 MG/ML IJ SOLN
INTRAMUSCULAR | Status: AC
  Filled 2018-06-23: qty 1

## 2018-06-23 MED ORDER — FENTANYL CITRATE (PF) 100 MCG/2ML IJ SOLN
25.0000 ug | INTRAMUSCULAR | Status: DC | PRN
  Administered 2018-06-23: 25 ug via INTRAVENOUS

## 2018-06-23 MED ORDER — LACTATED RINGERS IV SOLN
1000.0000 mL | Freq: Once | INTRAVENOUS | Status: AC
  Administered 2018-06-23: 1000 mL via INTRAVENOUS

## 2018-06-23 MED ORDER — ROPIVACAINE HCL 2 MG/ML IJ SOLN
INTRAMUSCULAR | Status: AC
  Filled 2018-06-23: qty 10

## 2018-06-23 MED ORDER — FENTANYL CITRATE (PF) 100 MCG/2ML IJ SOLN
INTRAMUSCULAR | Status: AC
  Filled 2018-06-23: qty 2

## 2018-06-23 MED ORDER — LIDOCAINE HCL 2 % IJ SOLN
20.0000 mL | Freq: Once | INTRAMUSCULAR | Status: AC
  Administered 2018-06-23: 400 mg

## 2018-06-23 MED ORDER — DEXAMETHASONE SODIUM PHOSPHATE 10 MG/ML IJ SOLN
INTRAMUSCULAR | Status: AC
  Filled 2018-06-23: qty 2

## 2018-06-23 MED ORDER — LIDOCAINE HCL 2 % IJ SOLN
INTRAMUSCULAR | Status: AC
  Filled 2018-06-23: qty 20

## 2018-06-23 MED ORDER — DEXAMETHASONE SODIUM PHOSPHATE 10 MG/ML IJ SOLN
10.0000 mg | Freq: Once | INTRAMUSCULAR | Status: AC
  Administered 2018-06-23: 10 mg

## 2018-06-23 MED ORDER — ROPIVACAINE HCL 2 MG/ML IJ SOLN
10.0000 mL | Freq: Once | INTRAMUSCULAR | Status: AC
  Administered 2018-06-23: 10 mL

## 2018-06-23 NOTE — Progress Notes (Signed)
Patient's Name: Tamara Finley  MRN: [id number]  Referring Provider: Gillis Santa, MD  DOB: 12/06/1940  PCP: Ezequiel Kayser, MD  DOS: 06/23/2018  Note by: Gillis Santa, MD  Service setting: Ambulatory outpatient  Specialty: Interventional Pain Management  Patient type: Established  Location: [facility] (AMB) Pain Management Facility  Visit type: Interventional Procedure   Primary Reason for Visit: Interventional Pain Management Treatment. CC: Back Pain (lower)  Procedure:          Anesthesia, Analgesia, Anxiolysis:  Type: Lumbar Facet, Medial Branch Block(s)          Primary Purpose: Diagnostic Region: Posterolateral Lumbosacral Spine Level: L4, L5, Medial Branch Level(s). Injecting these levels blocks the L4-5 lumbar facet joints. Laterality: Bilateral  Type: Moderate (Conscious) Sedation combined with Local Anesthesia Indication(s): Analgesia and Anxiety Route: Intravenous (IV) IV Access: Secured Sedation: Meaningful verbal contact was maintained at all times during the procedure  Local Anesthetic: Lidocaine 2%  Position: Prone   Indications: 1. Lumbar spondylosis   2. Lumbar facet arthropathy (L4/L5 and L5/S1)    Pain Score: Pre-procedure: 7 /10 Post-procedure: 0-No pain/10  Pre-op Assessment:  Tamara Finley is a 77 y.o. (year old), female patient, seen today for interventional treatment. She  has a past surgical history that includes Cardiac surgery; Abdominal aortic aneurysm repair; Cardiac catheterization (N/A, 12/05/2015); Cardiac catheterization (Left, 12/31/2015); Breast excisional biopsy (Left, 2010); Breast excisional biopsy (Left, 07/01/2006); Breast biopsy (Left, 09/03/2016); Breast biopsy (Left, 09/25/2016); Bronchoscopy; CAROTID PTA/STENT INTERVENTION; Colonoscopy; Eye surgery; Laminectomy (07/25/2013); Back surgery; Mediastinoscopy; Mastectomy; Thoracoscopy (12/16/2016); and Colonoscopy with propofol (N/A, 06/15/2017). Tamara Finley has a current medication list which includes the  following prescription(s): acetaminophen, albuterol, amlodipine, anastrozole, vitamin c, aspirin, atorvastatin, calcium carbonate-vitamin d, cholecalciferol, felodipine, fluticasone, furosemide, gabapentin, gavilyte-n with flavor pack, hydralazine, hydrochlorothiazide, levothyroxine, magnesium, metoprolol succinate, mirtazapine, omega-3 acid ethyl esters, pantoprazole, polyethylene glycol powder, potassium chloride sa, simvastatin, sodium chloride, vitamin b-12, vitamin d (ergocalciferol), clopidogrel, gabapentin, oxycodone, peg [id number], and tramadol, and the following Facility-Administered Medications: fentanyl. Her primarily concern today is the Back Pain (lower)  Initial Vital Signs:  Pulse/HCG Rate: 91ECG Heart Rate: 86 Temp: 97.8 F (36.6 C) Resp: 16 BP: (!) 173/88 SpO2: 98 %  BMI: Estimated body mass index is 29.18 kg/m as calculated from the following:   Height as of this encounter: 5\' 4"  (1.626 m).   Weight as of this encounter: 170 lb (77.1 kg).  Risk Assessment: Allergies: Reviewed. She is allergic to benadryl [diphenhydramine hcl (sleep)]; diphenhydramine hcl; and alprazolam.  Allergy Precautions: None required Coagulopathies: Reviewed. None identified.  Blood-thinner therapy: None at this time Active Infection(s): Reviewed. None identified. Tamara Finley is afebrile  Site Confirmation: Tamara Finley was asked to confirm the procedure and laterality before marking the site Procedure checklist: Completed Consent: Before the procedure and under the influence of no sedative(s), amnesic(s), or anxiolytics, the patient was informed of the treatment options, risks and possible complications. To fulfill our ethical and legal obligations, as recommended by the American Medical Association's Code of Ethics, I have informed the patient of my clinical impression; the nature and purpose of the treatment or procedure; the risks, benefits, and possible complications of the  intervention; the alternatives, including doing nothing; the risk(s) and benefit(s) of the alternative treatment(s) or procedure(s); and the risk(s) and benefit(s) of doing nothing. The patient was provided information about the general risks and possible complications associated with the procedure. These may include, but are not limited to: failure to achieve desired goals, infection, bleeding, organ  or nerve damage, allergic reactions, paralysis, and death. In addition, the patient was informed of those risks and complications associated to Spine-related procedures, such as failure to decrease pain; infection (i.e.: Meningitis, epidural or intraspinal abscess); bleeding (i.e.: epidural hematoma, subarachnoid hemorrhage, or any other type of intraspinal or peri-dural bleeding); organ or nerve damage (i.e.: Any type of peripheral nerve, nerve root, or spinal cord injury) with subsequent damage to sensory, motor, and/or autonomic systems, resulting in permanent pain, numbness, and/or weakness of one or several areas of the body; allergic reactions; (i.e.: anaphylactic reaction); and/or death. Furthermore, the patient was informed of those risks and complications associated with the medications. These include, but are not limited to: allergic reactions (i.e.: anaphylactic or anaphylactoid reaction(s)); adrenal axis suppression; blood sugar elevation that in diabetics may result in ketoacidosis or comma; water retention that in patients with history of congestive heart failure may result in shortness of breath, pulmonary edema, and decompensation with resultant heart failure; weight gain; swelling or edema; medication-induced neural toxicity; particulate matter embolism and blood vessel occlusion with resultant organ, and/or nervous system infarction; and/or aseptic necrosis of one or more joints. Finally, the patient was informed that Medicine is not an exact science; therefore, there is also the possibility of  unforeseen or unpredictable risks and/or possible complications that may result in a catastrophic outcome. The patient indicated having understood very clearly. We have given the patient no guarantees and we have made no promises. Enough time was given to the patient to ask questions, all of which were answered to the patient's satisfaction. Ms. Courts has indicated that she wanted to continue with the procedure. Attestation: I, the ordering provider, attest that I have discussed with the patient the benefits, risks, side-effects, alternatives, likelihood of achieving goals, and potential problems during recovery for the procedure that I have provided informed consent. Date  Time: 06/23/2018  9:29 AM  Pre-Procedure Preparation:  Monitoring: As per clinic protocol. Respiration, ETCO2, SpO2, BP, heart rate and rhythm monitor placed and checked for adequate function Safety Precautions: Patient was assessed for positional comfort and pressure points before starting the procedure. Time-out: I initiated and conducted the "Time-out" before starting the procedure, as per protocol. The patient was asked to participate by confirming the accuracy of the "Time Out" information. Verification of the correct person, site, and procedure were performed and confirmed by me, the nursing staff, and the patient. "Time-out" conducted as per Joint Commission's Universal Protocol (UP.01.01.01). Time: 1027  Description of Procedure:          Laterality: Bilateral. The procedure was performed in identical fashion on both sides. Levels: L4, L5, Medial Branch Level(s) Area Prepped: Posterior Lumbosacral Region Prepping solution: ChloraPrep (2% chlorhexidine gluconate and 70% isopropyl alcohol) Safety Precautions: Aspiration looking for blood return was conducted prior to all injections. At no point did we inject any substances, as a needle was being advanced. Before injecting, the patient was told to immediately notify me if  she was experiencing any new onset of "ringing in the ears, or metallic taste in the mouth". No attempts were made at seeking any paresthesias. Safe injection practices and needle disposal techniques used. Medications properly checked for expiration dates. SDV (single dose vial) medications used. After the completion of the procedure, all disposable equipment used was discarded in the proper designated medical waste containers. Local Anesthesia: Protocol guidelines were followed. The patient was positioned over the fluoroscopy table. The area was prepped in the usual manner. The time-out was completed. The target  area was identified using fluoroscopy. A 12-in long, straight, sterile hemostat was used with fluoroscopic guidance to locate the targets for each level blocked. Once located, the skin was marked with an approved surgical skin marker. Once all sites were marked, the skin (epidermis, dermis, and hypodermis), as well as deeper tissues (fat, connective tissue and muscle) were infiltrated with a small amount of a short-acting local anesthetic, loaded on a 10cc syringe with a 25G, 1.5-in  Needle. An appropriate amount of time was allowed for local anesthetics to take effect before proceeding to the next step. Local Anesthetic: Lidocaine 2.0% The unused portion of the local anesthetic was discarded in the proper designated containers. Technical explanation of process:   L4 Medial Branch Nerve Block (MBB): The target area for the L4 medial branch is at the junction of the postero-lateral aspect of the superior articular process and the superior, posterior, and medial edge of the transverse process of L5. Under fluoroscopic guidance, a Quincke needle was inserted until contact was made with os over the superior postero-lateral aspect of the pedicular shadow (target area). After negative aspiration for blood, 1.88mL of the nerve block solution was injected without difficulty or complication. The needle was  removed intact. L5 Medial Branch Nerve Block (MBB): The target area for the L5 medial branch is at the junction of the postero-lateral aspect of the superior articular process and the superior, posterior, and medial edge of the sacral ala. Under fluoroscopic guidance, a Quincke needle was inserted until contact was made with os over the superior postero-lateral aspect of the pedicular shadow (target area). After negative aspiration for blood, 1.81mL of the nerve block solution was injected without difficulty or complication. The needle was removed intact.   Procedural Needles: 22-gauge, 3.5-inch, Quincke needles used for all levels. Nerve block solution: 6 cc solution made of 5 cc of 0.2% ropivacaine, 1 cc of Decadron 10 mg/cc.  1.5 cc injected at each level above bilaterally.  The unused portion of the solution was discarded in the proper designated containers.  Once the entire procedure was completed, the treated area was cleaned, making sure to leave some of the prepping solution back to take advantage of its long term bactericidal properties.   Illustration of the posterior view of the lumbar spine and the posterior neural structures. Laminae of L2 through S1 are labeled. DPRL5, dorsal primary ramus of L5; DPRS1, dorsal primary ramus of S1; DPR3, dorsal primary ramus of L3; FJ, facet (zygapophyseal) joint L3-L4; I, inferior articular process of L4; LB1, lateral branch of dorsal primary ramus of L1; IAB, inferior articular branches from L3 medial branch (supplies L4-L5 facet joint); IBP, intermediate branch plexus; MB3, medial branch of dorsal primary ramus of L3; NR3, third lumbar nerve root; S, superior articular process of L5; SAB, superior articular branches from L4 (supplies L4-5 facet joint also); TP3, transverse process of L3.  Vitals:   06/23/18 1040 06/23/18 1051 06/23/18 1101 06/23/18 1110  BP: 129/88 (!) 178/79 (!) 168/87 (!) 177/85  Pulse: 84     Resp: 17 14 13 14   Temp:  97.6 F (36.4  C)    TempSrc:      SpO2: 99% 100% 99% 99%  Weight:      Height:         Start Time: 1027 hrs. End Time: 1037 hrs.  Imaging Guidance (Spinal):          Type of Imaging Technique: Fluoroscopy Guidance (Spinal) Indication(s): Assistance in needle guidance and placement for  procedures requiring needle placement in or near specific anatomical locations not easily accessible without such assistance. Exposure Time: Please see nurses notes. Contrast: None used. Fluoroscopic Guidance: I was personally present during the use of fluoroscopy. "Tunnel Vision Technique" used to obtain the best possible view of the target area. Parallax error corrected before commencing the procedure. "Direction-depth-direction" technique used to introduce the needle under continuous pulsed fluoroscopy. Once target was reached, antero-posterior, oblique, and lateral fluoroscopic projection used confirm needle placement in all planes. Images permanently stored in EMR. Interpretation: No contrast injected. I personally interpreted the imaging intraoperatively. Adequate needle placement confirmed in multiple planes. Permanent images saved into the patient's record.  Antibiotic Prophylaxis:   Anti-infectives (From admission, onward)   None     Indication(s): None identified  Post-operative Assessment:  Post-procedure Vital Signs:  Pulse/HCG Rate: 8483 Temp: 97.6 F (36.4 C) Resp: 14 BP: (!) 177/85 SpO2: 99 %  EBL: None  Complications: No immediate post-treatment complications observed by team, or reported by patient.  Note: The patient tolerated the entire procedure well. A repeat set of vitals were taken after the procedure and the patient was kept under observation following institutional policy, for this type of procedure. Post-procedural neurological assessment was performed, showing return to baseline, prior to discharge. The patient was provided with post-procedure discharge instructions, including a  section on how to identify potential problems. Should any problems arise concerning this procedure, the patient was given instructions to immediately contact us, at any time, without hesitation. In any case, we plan to contact the patient by telephone for a follow-up status report regarding this interventional procedure.  Comments:  No additional relevant information.  Plan of Care   Imaging Orders     DG C-Arm 1-60 Min-No Report Procedure Orders    No procedure(s) ordered today    Medications ordered for procedure: Meds ordered this encounter  Medications  . lactated ringers infusion 1,000 mL  . fentaNYL (SUBLIMAZE) injection 25-100 mcg    Make sure Narcan is available in the pyxis when using this medication. In the event of respiratory depression (RR< 8/min): Titrate NARCAN (naloxone) in increments of 0.1 to 0.2 mg IV at 2-3 minute intervals, until desired degree of reversal.  . ropivacaine (PF) 2 mg/mL (0.2%) (NAROPIN) injection 10 mL  . lidocaine (XYLOCAINE) 2 % (with pres) injection 400 mg  . dexamethasone (DECADRON) injection 10 mg   Medications administered: We administered lactated ringers, fentaNYL, ropivacaine (PF) 2 mg/mL (0.2%), lidocaine, and dexamethasone.  See the medical record for exact dosing, route, and time of administration.  Disposition: Discharge home  Discharge Date & Time: 06/23/2018; 1110 hrs.   Physician-requested Follow-up: Return in about 4 weeks (around 07/21/2018) for Post Procedure Evaluation.  Future Appointments  Date Time Provider [place]  07/22/2018  9:45 AM Gillis Santa, MD [facility] None  07/27/2018  9:30 AM [facility] None  07/27/2018 10:30 AM Dew, Erskine Squibb, MD [facility] None  04/15/2019  8:30 AM [facility] None  04/15/2019  9:30 AM Dew, Erskine Squibb, MD [facility] None   Primary Care Physician: Ezequiel Kayser, MD Location: [facility] Note by: Gillis Santa, MD Date: 06/23/2018; Time:  1:14 PM  Disclaimer:  Medicine is not an exact science. The only guarantee in medicine is that nothing is guaranteed. It is important to note that the decision to proceed with this intervention was based on the information collected from the patient. The Data and conclusions were drawn from  the patient's questionnaire, the interview, and the physical examination. Because the information was provided in large part by the patient, it cannot be guaranteed that it has not been purposely or unconsciously manipulated. Every effort has been made to obtain as much relevant data as possible for this evaluation. It is important to note that the conclusions that lead to this procedure are derived in large part from the available data. Always take into account that the treatment will also be dependent on availability of resources and existing treatment guidelines, considered by other Pain Management Practitioners as being common knowledge and practice, at the time of the intervention. For Medico-Legal purposes, it is also important to point out that variation in procedural techniques and pharmacological choices are the acceptable norm. The indications, contraindications, technique, and results of the above procedure should only be interpreted and judged by a Board-Certified Interventional Pain Specialist with extensive familiarity and expertise in the same exact procedure and technique.

## 2018-06-23 NOTE — Progress Notes (Signed)
Safety precautions to be maintained throughout the outpatient stay will include: orient to surroundings, keep bed in low position, maintain call bell within reach at all times, provide assistance with transfer out of bed and ambulation.  

## 2018-06-23 NOTE — Patient Instructions (Signed)

## 2018-07-22 ENCOUNTER — Ambulatory Visit: Payer: Medicare HMO | Admitting: Student in an Organized Health Care Education/Training Program

## 2018-07-26 ENCOUNTER — Other Ambulatory Visit (INDEPENDENT_AMBULATORY_CARE_PROVIDER_SITE_OTHER): Payer: Self-pay | Admitting: Vascular Surgery

## 2018-07-26 DIAGNOSIS — I6523 Occlusion and stenosis of bilateral carotid arteries: Secondary | ICD-10-CM

## 2018-07-26 DIAGNOSIS — Z959 Presence of cardiac and vascular implant and graft, unspecified: Secondary | ICD-10-CM

## 2018-07-27 ENCOUNTER — Ambulatory Visit (INDEPENDENT_AMBULATORY_CARE_PROVIDER_SITE_OTHER): Payer: Medicare HMO | Admitting: Vascular Surgery

## 2018-07-27 ENCOUNTER — Encounter (INDEPENDENT_AMBULATORY_CARE_PROVIDER_SITE_OTHER): Payer: Medicare HMO

## 2018-08-02 ENCOUNTER — Other Ambulatory Visit: Payer: Self-pay

## 2018-08-02 ENCOUNTER — Ambulatory Visit
Payer: Medicare HMO | Attending: Student in an Organized Health Care Education/Training Program | Admitting: Student in an Organized Health Care Education/Training Program

## 2018-08-02 ENCOUNTER — Encounter: Payer: Self-pay | Admitting: Student in an Organized Health Care Education/Training Program

## 2018-08-02 VITALS — BP 125/68 | HR 102 | Temp 97.4°F | Resp 16 | Ht 64.0 in | Wt 176.0 lb

## 2018-08-02 DIAGNOSIS — K219 Gastro-esophageal reflux disease without esophagitis: Secondary | ICD-10-CM | POA: Insufficient documentation

## 2018-08-02 DIAGNOSIS — Z888 Allergy status to other drugs, medicaments and biological substances status: Secondary | ICD-10-CM | POA: Insufficient documentation

## 2018-08-02 DIAGNOSIS — I129 Hypertensive chronic kidney disease with stage 1 through stage 4 chronic kidney disease, or unspecified chronic kidney disease: Secondary | ICD-10-CM | POA: Insufficient documentation

## 2018-08-02 DIAGNOSIS — M47816 Spondylosis without myelopathy or radiculopathy, lumbar region: Secondary | ICD-10-CM

## 2018-08-02 DIAGNOSIS — Z87891 Personal history of nicotine dependence: Secondary | ICD-10-CM | POA: Diagnosis not present

## 2018-08-02 DIAGNOSIS — Z79899 Other long term (current) drug therapy: Secondary | ICD-10-CM | POA: Insufficient documentation

## 2018-08-02 DIAGNOSIS — F419 Anxiety disorder, unspecified: Secondary | ICD-10-CM | POA: Diagnosis not present

## 2018-08-02 DIAGNOSIS — J449 Chronic obstructive pulmonary disease, unspecified: Secondary | ICD-10-CM | POA: Diagnosis not present

## 2018-08-02 DIAGNOSIS — M48061 Spinal stenosis, lumbar region without neurogenic claudication: Secondary | ICD-10-CM | POA: Insufficient documentation

## 2018-08-02 DIAGNOSIS — I6523 Occlusion and stenosis of bilateral carotid arteries: Secondary | ICD-10-CM | POA: Diagnosis not present

## 2018-08-02 DIAGNOSIS — E1122 Type 2 diabetes mellitus with diabetic chronic kidney disease: Secondary | ICD-10-CM | POA: Insufficient documentation

## 2018-08-02 DIAGNOSIS — N183 Chronic kidney disease, stage 3 (moderate): Secondary | ICD-10-CM | POA: Insufficient documentation

## 2018-08-02 DIAGNOSIS — Z7982 Long term (current) use of aspirin: Secondary | ICD-10-CM | POA: Diagnosis not present

## 2018-08-02 DIAGNOSIS — F329 Major depressive disorder, single episode, unspecified: Secondary | ICD-10-CM | POA: Diagnosis not present

## 2018-08-02 DIAGNOSIS — I714 Abdominal aortic aneurysm, without rupture: Secondary | ICD-10-CM | POA: Insufficient documentation

## 2018-08-02 DIAGNOSIS — E039 Hypothyroidism, unspecified: Secondary | ICD-10-CM | POA: Insufficient documentation

## 2018-08-02 DIAGNOSIS — Z8673 Personal history of transient ischemic attack (TIA), and cerebral infarction without residual deficits: Secondary | ICD-10-CM | POA: Diagnosis not present

## 2018-08-02 DIAGNOSIS — G894 Chronic pain syndrome: Secondary | ICD-10-CM | POA: Insufficient documentation

## 2018-08-02 DIAGNOSIS — E785 Hyperlipidemia, unspecified: Secondary | ICD-10-CM | POA: Insufficient documentation

## 2018-08-02 DIAGNOSIS — M533 Sacrococcygeal disorders, not elsewhere classified: Secondary | ICD-10-CM | POA: Insufficient documentation

## 2018-08-02 DIAGNOSIS — I2510 Atherosclerotic heart disease of native coronary artery without angina pectoris: Secondary | ICD-10-CM | POA: Diagnosis not present

## 2018-08-02 DIAGNOSIS — M1611 Unilateral primary osteoarthritis, right hip: Secondary | ICD-10-CM | POA: Diagnosis not present

## 2018-08-02 DIAGNOSIS — E7800 Pure hypercholesterolemia, unspecified: Secondary | ICD-10-CM | POA: Insufficient documentation

## 2018-08-02 DIAGNOSIS — G43909 Migraine, unspecified, not intractable, without status migrainosus: Secondary | ICD-10-CM | POA: Diagnosis not present

## 2018-08-02 NOTE — Progress Notes (Signed)
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  Patient's Rights: You have the right to refuse treatment. You also have the right to change your mind, even after initially having agreed to have the treatment done. However, under this last option, if you wait until the last second to change your mind, you may be charged for the materials used up to that point.  Introduction: Medicine is not an Chief Strategy Officer. Everything in Medicine, including the lack of treatment(s), carries the potential for danger, harm, or loss (which is by definition: Risk). In Medicine, a complication is a secondary problem, condition, or disease that can aggravate an already existing one. All treatments carry the risk of possible complications. The fact that a side effects or complications occurs, does not imply that the treatment was conducted incorrectly. It must be clearly understood that these can happen even when everything is done following the highest safety standards.  No treatment: You can choose not to proceed with the proposed treatment alternative. The "PRO(s)" would include: avoiding the risk of complications associated with the  therapy. The "CON(s)" would include: not getting any of the treatment benefits. These benefits fall under one of three categories: diagnostic; therapeutic; and/or palliative. Diagnostic benefits include: getting information which can ultimately lead to improvement of the disease or symptom(s). Therapeutic benefits are those associated with the successful treatment of the disease. Finally, palliative benefits are those related to the decrease of the primary symptoms, without necessarily curing the condition (example: decreasing the pain from a flare-up of a chronic condition, such as incurable terminal cancer).  General Risks and Complications: These are associated to most interventional treatments. They can occur alone, or in combination. They fall under one of the following six (6) categories: no benefit or worsening of symptoms; bleeding; infection; nerve damage; allergic reactions; and/or death. 1. No benefits or worsening of symptoms: In Medicine there are no guarantees, only probabilities. No healthcare provider can ever guarantee that a medical treatment will work, they can only state the probability that it may. Furthermore, there is always the possibility that the condition may worsen, either directly, or indirectly, as a consequence of the treatment. 2. Bleeding: This is more common if the patient is taking a blood thinner, either prescription or over the counter (example: Goody Powders, Fish oil, Aspirin, Garlic, etc.), or if suffering a condition associated with impaired coagulation (example: Hemophilia, cirrhosis of the liver, low platelet counts, etc.). However, even if you do not have one on these, it can still happen. If you have any of these conditions, or take one of these drugs, make sure to notify your treating physician. 3. Infection: This is more common in patients with a compromised immune system, either due to disease (example: diabetes, cancer, human immunodeficiency virus [HIV], etc.),  or due to medications or treatments (example: therapies used to treat cancer and rheumatological diseases). However, even if you do not have one on these, it can still happen. If you have any of these conditions, or take one of these drugs, make sure to notify your  treating physician. 4. Nerve Damage: This is more common when the treatment is an invasive one, but it can also happen with the use of medications, such as those used in the treatment of cancer. The damage can occur to small secondary nerves, or to large primary ones, such as those in the spinal cord and brain. This damage may be temporary or permanent and it may lead to impairments that can range from temporary numbness to permanent paralysis and/or brain death. 5. Allergic Reactions: Any time a substance or material comes in contact with our body, there is the possibility of an allergic reaction. These can range from a mild skin rash (contact dermatitis) to a severe systemic reaction (anaphylactic reaction), which can result in death. 6. Death: In general, any medical intervention can result in death, most of the time due to an unforeseen complication. ____________________________________________________________________________________________  ____________________________________________________________________________________________  Preparing for Procedure with Sedation  Instructions: . Oral Intake: Do not eat or drink anything for at least 8 hours prior to your procedure. . Transportation: Public transportation is not allowed. Bring an adult driver. The driver must be physically present in our waiting room before any procedure can be started. Marland Kitchen Physical Assistance: Bring an adult physically capable of assisting you, in the event you need help. This adult should keep you company at home for at least 6 hours after the procedure. . Blood Pressure Medicine: Take your blood pressure medicine with a sip of water the morning of the  procedure. . Blood thinners: Notify our staff if you are taking any blood thinners. Depending on which one you take, there will be specific instructions on how and when to stop it. . Diabetics on insulin: Notify the staff so that you can be scheduled 1st case in the morning. If your diabetes requires high dose insulin, take only  of your normal insulin dose the morning of the procedure and notify the staff that you have done so. . Preventing infections: Shower with an antibacterial soap the morning of your procedure. . Build-up your immune system: Take 1000 mg of Vitamin C with every meal (3 times a day) the day prior to your procedure. Marland Kitchen Antibiotics: Inform the staff if you have a condition or reason that requires you to take antibiotics before dental procedures. . Pregnancy: If you are pregnant, call and cancel the procedure. . Sickness: If you have a cold, fever, or any active infections, call and cancel the procedure. . Arrival: You must be in the facility at least 30 minutes prior to your scheduled procedure. . Children: Do not bring children with you. . Dress appropriately: Bring dark clothing that you would not mind if they get stained. . Valuables: Do not bring any jewelry or valuables.  Procedure appointments are reserved for interventional treatments only. Marland Kitchen No Prescription Refills. . No medication changes will be discussed during procedure appointments. . No disability issues will be discussed.  Reasons to call and reschedule or cancel your procedure: (Following these recommendations will minimize the risk of a serious complication.) . Surgeries: Avoid having procedures within 2 weeks of any surgery. (Avoid for 2 weeks before or after any surgery). . Flu Shots: Avoid having procedures within 2 weeks of a flu shots or . (Avoid for 2 weeks before or after immunizations). . Barium: Avoid having a procedure within 7-10 days after having had a radiological study involving the use of  radiological contrast. (Myelograms, Barium swallow or enema study). . Heart attacks: Avoid any elective procedures or surgeries  for the initial 6 months after a "Myocardial Infarction" (Heart Attack). . Blood thinners: It is imperative that you stop these medications before procedures. Let us know if you if you take any blood thinner.  . Infection: Avoid procedures during or within two weeks of an infection (including chest colds or gastrointestinal problems). Symptoms associated with infections include: Localized redness, fever, chills, night sweats or profuse sweating, burning sensation when voiding, cough, congestion, stuffiness, runny nose, sore throat, diarrhea, nausea, vomiting, cold or Flu symptoms, recent or current infections. It is specially important if the infection is over the area that we intend to treat. Marland Kitchen Heart and lung problems: Symptoms that may suggest an active cardiopulmonary problem include: cough, chest pain, breathing difficulties or shortness of breath, dizziness, ankle swelling, uncontrolled high or unusually low blood pressure, and/or palpitations. If you are experiencing any of these symptoms, cancel your procedure and contact your primary care physician for an evaluation.  Remember:  Regular Business hours are:  Monday to Thursday 8:00 AM to 4:00 PM  Provider's Schedule: Milinda Pointer, MD:  Procedure days: Tuesday and Thursday 7:30 AM to 4:00 PM  Gillis Santa, MD:  Procedure days: Monday and Wednesday 7:30 AM to 4:00 PM ____________________________________________________________________________________________  Facet Blocks Patient Information  Description: The facets are joints in the spine between the vertebrae.  Like any joints in the body, facets can become irritated and painful.  Arthritis can also effect the facets.  By injecting steroids and local anesthetic in and around these joints, we can temporarily block the nerve supply to them.  Steroids act  directly on irritated nerves and tissues to reduce selling and inflammation which often leads to decreased pain.  Facet blocks may be done anywhere along the spine from the neck to the low back depending upon the location of your pain.   After numbing the skin with local anesthetic (like Novocaine), a small needle is passed onto the facet joints under x-ray guidance.  You may experience a sensation of pressure while this is being done.  The entire block usually lasts about 15-25 minutes.   Conditions which may be treated by facet blocks:   Low back/buttock pain  Neck/shoulder pain  Certain types of headaches  Preparation for the injection:  1. Do not eat any solid food or dairy products within 8 hours of your appointment. 2. You may drink clear liquid up to 3 hours before appointment.  Clear liquids include water, black coffee, juice or soda.  No milk or cream please. 3. You may take your regular medication, including pain medications, with a sip of water before your appointment.  Diabetics should hold regular insulin (if taken separately) and take 1/2 normal NPH dose the morning of the procedure.  Carry some sugar containing items with you to your appointment. 4. A driver must accompany you and be prepared to drive you home after your procedure. 5. Bring all your current medications with you. 6. An IV may be inserted and sedation may be given at the discretion of the physician. 7. A blood pressure cuff, EKG and other monitors will often be applied during the procedure.  Some patients may need to have extra oxygen administered for a short period. 8. You will be asked to provide medical information, including your allergies and medications, prior to the procedure.  We must know immediately if you are taking blood thinners (like Coumadin/Warfarin) or if you are allergic to IV iodine contrast (dye).  We must know if you could possible  be pregnant.  Possible side-effects:   Bleeding from needle  site  Infection (rare, may require surgery)  Nerve injury (rare)  Numbness & tingling (temporary)  Difficulty urinating (rare, temporary)  Spinal headache (a headache worse with upright posture)  Light-headedness (temporary)  Pain at injection site (serveral days)  Decreased blood pressure (rare, temporary)  Weakness in arm/leg (temporary)  Pressure sensation in back/neck (temporary)   Call if you experience:   Fever/chills associated with headache or increased back/neck pain  Headache worsened by an upright position  New onset, weakness or numbness of an extremity below the injection site  Hives or difficulty breathing (go to the emergency room)  Inflammation or drainage at the injection site(s)  Severe back/neck pain greater than usual  New symptoms which are concerning to you  Please note:  Although the local anesthetic injected can often make your back or neck feel good for several hours after the injection, the pain will likely return. It takes 3-7 days for steroids to work.  You may not notice any pain relief for at least one week.  If effective, we will often do a series of 2-3 injections spaced 3-6 weeks apart to maximally decrease your pain.  After the initial series, you may be a candidate for a more permanent nerve block of the facets.  If you have any questions, please call #[phone]) [facility]

## 2018-08-02 NOTE — Patient Instructions (Addendum)
1. Recommend aquatic therapy 2. Recommend home on floor machine bike 3. Repeat lumbar facet blocks ____________________________________________________________________________________________  General Risks and Possible Complications  Patient Responsibilities: It is important that you read this as it is part of your informed consent. It is our duty to inform you of the risks and possible complications associated with treatments offered to you. It is your responsibility as a patient to read this and to ask questions about anything that is not clear or that you believe was not covered in this document.  Patient's Rights: You have the right to refuse treatment. You also have the right to change your mind, even after initially having agreed to have the treatment done. However, under this last option, if you wait until the last second to change your mind, you may be charged for the materials used up to that point.  Introduction: Medicine is not an Chief Strategy Officer. Everything in Medicine, including the lack of treatment(s), carries the potential for danger, harm, or loss (which is by definition: Risk). In Medicine, a complication is a secondary problem, condition, or disease that can aggravate an already existing one. All treatments carry the risk of possible complications. The fact that a side effects or complications occurs, does not imply that the treatment was conducted incorrectly. It must be clearly understood that these can happen even when everything is done following the highest safety standards.  No treatment: You can choose not to proceed with the proposed treatment alternative. The "PRO(s)" would include: avoiding the risk of complications associated with the therapy. The "CON(s)" would include: not getting any of the treatment benefits. These benefits fall under one of three categories: diagnostic; therapeutic; and/or palliative. Diagnostic benefits include: getting information which can ultimately  lead to improvement of the disease or symptom(s). Therapeutic benefits are those associated with the successful treatment of the disease. Finally, palliative benefits are those related to the decrease of the primary symptoms, without necessarily curing the condition (example: decreasing the pain from a flare-up of a chronic condition, such as incurable terminal cancer).  General Risks and Complications: These are associated to most interventional treatments. They can occur alone, or in combination. They fall under one of the following six (6) categories: no benefit or worsening of symptoms; bleeding; infection; nerve damage; allergic reactions; and/or death. 1. No benefits or worsening of symptoms: In Medicine there are no guarantees, only probabilities. No healthcare provider can ever guarantee that a medical treatment will work, they can only state the probability that it may. Furthermore, there is always the possibility that the condition may worsen, either directly, or indirectly, as a consequence of the treatment. 2. Bleeding: This is more common if the patient is taking a blood thinner, either prescription or over the counter (example: Goody Powders, Fish oil, Aspirin, Garlic, etc.), or if suffering a condition associated with impaired coagulation (example: Hemophilia, cirrhosis of the liver, low platelet counts, etc.). However, even if you do not have one on these, it can still happen. If you have any of these conditions, or take one of these drugs, make sure to notify your treating physician. 3. Infection: This is more common in patients with a compromised immune system, either due to disease (example: diabetes, cancer, human immunodeficiency virus [HIV], etc.), or due to medications or treatments (example: therapies used to treat cancer and rheumatological diseases). However, even if you do not have one on these, it can still happen. If you have any of these conditions, or take one of these drugs,  make  sure to notify your treating physician. 4. Nerve Damage: This is more common when the treatment is an invasive one, but it can also happen with the use of medications, such as those used in the treatment of cancer. The damage can occur to small secondary nerves, or to large primary ones, such as those in the spinal cord and brain. This damage may be temporary or permanent and it may lead to impairments that can range from temporary numbness to permanent paralysis and/or brain death. 5. Allergic Reactions: Any time a substance or material comes in contact with our body, there is the possibility of an allergic reaction. These can range from a mild skin rash (contact dermatitis) to a severe systemic reaction (anaphylactic reaction), which can result in death. 6. Death: In general, any medical intervention can result in death, most of the time due to an unforeseen complication. ____________________________________________________________________________________________  ____________________________________________________________________________________________  Preparing for Procedure with Sedation  Instructions: . Oral Intake: Do not eat or drink anything for at least 8 hours prior to your procedure. . Transportation: Public transportation is not allowed. Bring an adult driver. The driver must be physically present in our waiting room before any procedure can be started. Marland Kitchen Physical Assistance: Bring an adult physically capable of assisting you, in the event you need help. This adult should keep you company at home for at least 6 hours after the procedure. . Blood Pressure Medicine: Take your blood pressure medicine with a sip of water the morning of the procedure. . Blood thinners: Notify our staff if you are taking any blood thinners. Depending on which one you take, there will be specific instructions on how and when to stop it. . Diabetics on insulin: Notify the staff so that you can be scheduled  1st case in the morning. If your diabetes requires high dose insulin, take only  of your normal insulin dose the morning of the procedure and notify the staff that you have done so. . Preventing infections: Shower with an antibacterial soap the morning of your procedure. . Build-up your immune system: Take 1000 mg of Vitamin C with every meal (3 times a day) the day prior to your procedure. Marland Kitchen Antibiotics: Inform the staff if you have a condition or reason that requires you to take antibiotics before dental procedures. . Pregnancy: If you are pregnant, call and cancel the procedure. . Sickness: If you have a cold, fever, or any active infections, call and cancel the procedure. . Arrival: You must be in the facility at least 30 minutes prior to your scheduled procedure. . Children: Do not bring children with you. . Dress appropriately: Bring dark clothing that you would not mind if they get stained. . Valuables: Do not bring any jewelry or valuables.  Procedure appointments are reserved for interventional treatments only. Marland Kitchen No Prescription Refills. . No medication changes will be discussed during procedure appointments. . No disability issues will be discussed.  Reasons to call and reschedule or cancel your procedure: (Following these recommendations will minimize the risk of a serious complication.) . Surgeries: Avoid having procedures within 2 weeks of any surgery. (Avoid for 2 weeks before or after any surgery). . Flu Shots: Avoid having procedures within 2 weeks of a flu shots or . (Avoid for 2 weeks before or after immunizations). . Barium: Avoid having a procedure within 7-10 days after having had a radiological study involving the use of radiological contrast. (Myelograms, Barium swallow or enema study). . Heart attacks: Avoid  any elective procedures or surgeries for the initial 6 months after a "Myocardial Infarction" (Heart Attack). . Blood thinners: It is imperative that you stop these  medications before procedures. Let us know if you if you take any blood thinner.  . Infection: Avoid procedures during or within two weeks of an infection (including chest colds or gastrointestinal problems). Symptoms associated with infections include: Localized redness, fever, chills, night sweats or profuse sweating, burning sensation when voiding, cough, congestion, stuffiness, runny nose, sore throat, diarrhea, nausea, vomiting, cold or Flu symptoms, recent or current infections. It is specially important if the infection is over the area that we intend to treat. Marland Kitchen Heart and lung problems: Symptoms that may suggest an active cardiopulmonary problem include: cough, chest pain, breathing difficulties or shortness of breath, dizziness, ankle swelling, uncontrolled high or unusually low blood pressure, and/or palpitations. If you are experiencing any of these symptoms, cancel your procedure and contact your primary care physician for an evaluation.  Remember:  Regular Business hours are:  Monday to Thursday 8:00 AM to 4:00 PM  Provider's Schedule: Milinda Pointer, MD:  Procedure days: Tuesday and Thursday 7:30 AM to 4:00 PM  Gillis Santa, MD:  Procedure days: Monday and Wednesday 7:30 AM to 4:00 PM ____________________________________________________________________________________________  Facet Blocks Patient Information  Description: The facets are joints in the spine between the vertebrae.  Like any joints in the body, facets can become irritated and painful.  Arthritis can also effect the facets.  By injecting steroids and local anesthetic in and around these joints, we can temporarily block the nerve supply to them.  Steroids act directly on irritated nerves and tissues to reduce selling and inflammation which often leads to decreased pain.  Facet blocks may be done anywhere along the spine from the neck to the low back depending upon the location of your pain.   After numbing the skin  with local anesthetic (like Novocaine), a small needle is passed onto the facet joints under x-ray guidance.  You may experience a sensation of pressure while this is being done.  The entire block usually lasts about 15-25 minutes.   Conditions which may be treated by facet blocks:   Low back/buttock pain  Neck/shoulder pain  Certain types of headaches  Preparation for the injection:  1. Do not eat any solid food or dairy products within 8 hours of your appointment. 2. You may drink clear liquid up to 3 hours before appointment.  Clear liquids include water, black coffee, juice or soda.  No milk or cream please. 3. You may take your regular medication, including pain medications, with a sip of water before your appointment.  Diabetics should hold regular insulin (if taken separately) and take 1/2 normal NPH dose the morning of the procedure.  Carry some sugar containing items with you to your appointment. 4. A driver must accompany you and be prepared to drive you home after your procedure. 5. Bring all your current medications with you. 6. An IV may be inserted and sedation may be given at the discretion of the physician. 7. A blood pressure cuff, EKG and other monitors will often be applied during the procedure.  Some patients may need to have extra oxygen administered for a short period. 8. You will be asked to provide medical information, including your allergies and medications, prior to the procedure.  We must know immediately if you are taking blood thinners (like Coumadin/Warfarin) or if you are allergic to IV iodine contrast (dye).  We  must know if you could possible be pregnant.  Possible side-effects:   Bleeding from needle site  Infection (rare, may require surgery)  Nerve injury (rare)  Numbness & tingling (temporary)  Difficulty urinating (rare, temporary)  Spinal headache (a headache worse with upright posture)  Light-headedness (temporary)  Pain at injection  site (serveral days)  Decreased blood pressure (rare, temporary)  Weakness in arm/leg (temporary)  Pressure sensation in back/neck (temporary)   Call if you experience:   Fever/chills associated with headache or increased back/neck pain  Headache worsened by an upright position  New onset, weakness or numbness of an extremity below the injection site  Hives or difficulty breathing (go to the emergency room)  Inflammation or drainage at the injection site(s)  Severe back/neck pain greater than usual  New symptoms which are concerning to you  Please note:  Although the local anesthetic injected can often make your back or neck feel good for several hours after the injection, the pain will likely return. It takes 3-7 days for steroids to work.  You may not notice any pain relief for at least one week.  If effective, we will often do a series of 2-3 injections spaced 3-6 weeks apart to maximally decrease your pain.  After the initial series, you may be a candidate for a more permanent nerve block of the facets.  If you have any questions, please call #[phone]) [facility]

## 2018-08-02 NOTE — Progress Notes (Signed)
Safety precautions to be maintained throughout the outpatient stay will include: orient to surroundings, keep bed in low position, maintain call bell within reach at all times, provide assistance with transfer out of bed and ambulation.  

## 2018-08-06 ENCOUNTER — Encounter (INDEPENDENT_AMBULATORY_CARE_PROVIDER_SITE_OTHER): Payer: Self-pay | Admitting: Nurse Practitioner

## 2018-08-06 ENCOUNTER — Ambulatory Visit (INDEPENDENT_AMBULATORY_CARE_PROVIDER_SITE_OTHER): Payer: Medicare HMO

## 2018-08-06 ENCOUNTER — Ambulatory Visit (INDEPENDENT_AMBULATORY_CARE_PROVIDER_SITE_OTHER): Payer: Medicare HMO | Admitting: Nurse Practitioner

## 2018-08-06 VITALS — BP 179/87 | HR 82 | Resp 16 | Wt 182.0 lb

## 2018-08-06 DIAGNOSIS — Z959 Presence of cardiac and vascular implant and graft, unspecified: Secondary | ICD-10-CM | POA: Diagnosis not present

## 2018-08-06 DIAGNOSIS — I6523 Occlusion and stenosis of bilateral carotid arteries: Secondary | ICD-10-CM

## 2018-08-06 DIAGNOSIS — I10 Essential (primary) hypertension: Secondary | ICD-10-CM | POA: Diagnosis not present

## 2018-08-06 DIAGNOSIS — I701 Atherosclerosis of renal artery: Secondary | ICD-10-CM

## 2018-08-06 DIAGNOSIS — K219 Gastro-esophageal reflux disease without esophagitis: Secondary | ICD-10-CM | POA: Diagnosis not present

## 2018-08-06 DIAGNOSIS — Z7982 Long term (current) use of aspirin: Secondary | ICD-10-CM

## 2018-08-12 ENCOUNTER — Encounter (INDEPENDENT_AMBULATORY_CARE_PROVIDER_SITE_OTHER): Payer: Self-pay | Admitting: Nurse Practitioner

## 2018-08-12 ENCOUNTER — Other Ambulatory Visit (INDEPENDENT_AMBULATORY_CARE_PROVIDER_SITE_OTHER): Payer: Self-pay | Admitting: Nurse Practitioner

## 2018-08-12 NOTE — Progress Notes (Signed)
Subjective:    Patient ID: Tamara Finley, female    DOB: 10-31-1940, 77 y.o.   MRN: [id number] Chief Complaint  Patient presents with  . Follow-up    60yr carotid     HPI  Tamara Finley is a 77 y.o. female The patient is seen for follow up evaluation of carotid stenosis. The carotid stenosis followed by ultrasound.   The patient denies amaurosis fugax. There is no recent history of TIA symptoms or focal motor deficits.  There is prior documented CVA  The patient is taking enteric-coated aspirin 81 mg daily.  There is no history of migraine headaches. There is no history of seizures.  The patient has a history of coronary artery disease, no recent episodes of angina or shortness of breath. The patient denies PAD or claudication symptoms. There is a history of hyperlipidemia which is being treated with a statin.    Carotid Duplex done today shows 40 to 59% stenosis of the right internal carotid artery and 1 to 39% stenosis of the left internal carotid artery.  This is consistent with results on 04/21/2017.  Past Medical History:  Diagnosis Date  . Abdominal aortic aneurysm (AAA) ([place]) 2000's  . Age related entropion, unspecified laterality   . Anxiety   . Anxiety and depression   . Bilateral renal artery stenosis ([place])   . Breast calcification, left   . Cancer ([place]) 10/20/2016   breast cancer  . Carotid artery thrombosis, bilateral   . Carotid stenosis   . Chronic bronchitis ([place])   . Chronic insomnia   . COPD (chronic obstructive pulmonary disease) ([place])   . Coronary artery disease   . CRI (chronic renal insufficiency), stage 3 (moderate) (H[facility])   . Depression   . Diabetes mellitus without complication ([place])   . GERD (gastroesophageal reflux disease)   . Heart palpitations   . History of AAA (abdominal aortic aneurysm) repair   . History of colon polyps   . History of TIA (transient ischemic attack)   . Hypercholesterolemia   . Hypertension   . Hypothyroidism   .  Migraine headache   . Osteoarthritis   . Osteoporosis   . Peripheral arterial disease ([place])   . Pulmonary scarring   . Renal disorder   . SOB (shortness of breath)   . Stenosis of left subclavian artery (HCC)   . Stroke ([place])   . Thyroid disease   . TIA (transient ischemic attack)   . Type 2 diabetes mellitus with vascular disease [facility])     Past Surgical History:  Procedure Laterality Date  . ABDOMINAL AORTIC ANEURYSM REPAIR    . BACK SURGERY    . BREAST BIOPSY Left 09/03/2016   benign  . BREAST BIOPSY Left 09/25/2016   path pending  . BREAST EXCISIONAL BIOPSY Left 2010  . BREAST EXCISIONAL BIOPSY Left 07/01/2006  . BRONCHOSCOPY    . CARDIAC SURGERY    . CAROTID PTA/STENT INTERVENTION    . COLONOSCOPY    . COLONOSCOPY WITH PROPOFOL N/A 06/15/2017   Procedure: COLONOSCOPY WITH PROPOFOL;  Surgeon: Lollie Sails, MD;  Location: [facility] ENDOSCOPY;  Service: Endoscopy;  Laterality: N/A;  . EYE SURGERY    . LAMINECTOMY  07/25/2013  . MASTECTOMY    . MEDIASTINOSCOPY    . PERIPHERAL VASCULAR CATHETERIZATION N/A 12/05/2015   Procedure: Renal Angiography;  Surgeon: Katha Cabal, MD;  Location: [place] CV LAB;  Service: Cardiovascular;  Laterality: N/A;  . PERIPHERAL VASCULAR CATHETERIZATION Left  12/31/2015   Procedure: Carotid PTA/Stent Intervention;  Surgeon: Algernon Huxley, MD;  Location: [place] CV LAB;  Service: Cardiovascular;  Laterality: Left;  . THORACOSCOPY  12/16/2016    Social History   Socioeconomic History  . Marital status: Married    Spouse name: Not on file  . Number of children: Not on file  . Years of education: Not on file  . Highest education level: Not on file  Occupational History  . Not on file  Social Needs  . Financial resource strain: Not on file  . Food insecurity:    Worry: Not on file    Inability: Not on file  . Transportation needs:    Medical: Not on file    Non-medical: Not on file  Tobacco Use  . Smoking status: Former  Smoker    Packs/day: 2.00    Years: 50.00    Pack years: 100.00    Types: Cigarettes    Last attempt to quit: 06/01/2013    Years since quitting: 5.2  . Smokeless tobacco: Never Used  Substance and Sexual Activity  . Alcohol use: No    Alcohol/week: 0.0 standard drinks  . Drug use: No  . Sexual activity: Not on file  Lifestyle  . Physical activity:    Days per week: Not on file    Minutes per session: Not on file  . Stress: Not on file  Relationships  . Social connections:    Talks on phone: Not on file    Gets together: Not on file    Attends religious service: Not on file    Active member of club or organization: Not on file    Attends meetings of clubs or organizations: Not on file    Relationship status: Not on file  . Intimate partner violence:    Fear of current or ex partner: Not on file    Emotionally abused: Not on file    Physically abused: Not on file    Forced sexual activity: Not on file  Other Topics Concern  . Not on file  Social History Narrative  . Not on file    Family History  Problem Relation Age of Onset  . CVA Mother   . Hypertension Mother   . Stroke Mother   . Epilepsy Mother   . Dementia Father   . Stroke Father   . Coronary artery disease Father   . Hypertension Father   . Ovarian cancer Sister   . Cancer Brother   . Diabetes Brother     Allergies  Allergen Reactions  . Benadryl [Diphenhydramine Hcl (Sleep)] Other (See Comments)    BAD DREAMS  . Diphenhydramine Hcl Other (See Comments)    Makes her "hyper"  . Alprazolam Rash    "bad dreams"     Review of Systems   Review of Systems: Negative Unless Checked Constitutional: [] Weight loss  [] Fever  [] Chills Cardiac: [] Chest pain   []  Atrial Fibrillation  [] Palpitations   [] Shortness of breath when laying flat   [] Shortness of breath with exertion. Vascular:  [] Pain in legs with walking   [] Pain in legs with standing  [] History of DVT   [] Phlebitis   [] Swelling in legs    [] Varicose veins   [] Non-healing ulcers Pulmonary:   [] Uses home oxygen   [] Productive cough   [] Hemoptysis   [] Wheeze  [x] COPD   [] Asthma Neurologic:  [] Dizziness   [] Seizures   [x] History of stroke   [x] History of TIA  [] Aphasia   []   Vissual changes   [] Weakness or numbness in arm   [x] Weakness or numbness in leg Musculoskeletal:   [] Joint swelling   [x] Joint pain   [x] Low back pain  []  History of Knee Replacement Hematologic:  [] Easy bruising  [] Easy bleeding   [] Hypercoagulable state   [] Anemic Gastrointestinal:  [] Diarrhea   [] Vomiting  [] Gastroesophageal reflux/heartburn   [] Difficulty swallowing. Genitourinary:  [] Chronic kidney disease   [] Difficult urination  [] Anuric   [] Blood in urine Skin:  [] Rashes   [] Ulcers  Psychological:  [] History of anxiety   []  History of major depression  []  Memory Difficulties     Objective:   Physical Exam  BP (!) 179/87 (BP Location: Right Arm)   Pulse 82   Resp 16   Wt 182 lb (82.6 kg)   BMI 31.24 kg/m   Gen: WD/WN, NAD Head: [place]/AT, No temporalis wasting.  Ear/Nose/Throat: Hearing grossly intact, nares w/o erythema or drainage Eyes: PER, EOMI, sclera nonicteric.  Neck: Supple, no masses.  No JVD.  No bruits auscultated Pulmonary:  Good air movement, no use of accessory muscles.  Cardiac: RRR Vascular:  Vessel Right Left  Radial Palpable Palpable  Gastrointestinal: soft, non-distended. No guarding/no peritoneal signs.  No renal bruits auscultated Musculoskeletal: M/S 5/5 throughout.  No deformity or atrophy.  Neurologic: Pain and light touch intact in extremities.  Symmetrical.  Speech is fluent. Motor exam as listed above. Psychiatric: Judgment intact, Mood & affect appropriate for pt's clinical situation. Dermatologic: No Venous rashes. No Ulcers Noted.  No changes consistent with cellulitis. Lymph : No Cervical lymphadenopathy, no lichenification or skin changes of chronic lymphedema.      Assessment & Plan:   1. Carotid  atherosclerosis, bilateral Recommend:  Given the patient's asymptomatic subcritical stenosis no further invasive testing or surgery at this time.  Duplex ultrasound shows 40 to 59% stenosis of her right internal carotid artery and 1 to 39% stenosis of the left internal carotid artery  Continue antiplatelet therapy as prescribed Continue management of CAD, HTN and Hyperlipidemia Healthy heart diet,  encouraged exercise at least 4 times per week Follow up in 12 months with duplex ultrasound and physical exam  - VAS US CAROTID; Future  2. Essential hypertension Continue antihypertensive medications as already ordered, these medications have been reviewed and there are no changes at this time.   3. Renal artery stenosis [facility]) Patient had elevated blood pressure during today's visit was concerned due to her history of renal artery stenosis.  Otherwise she has not noticed any other elevated blood pressures.  She denies any issues or elevations in her kidney function lab work.  Noted to the patient that she should check her blood pressure for the next week to know if there are elevations of her blood pressure and if they are continually elevated she should contact her primary care provider to see if medications need to be changed or if we need to repeat her renal ultrasound sooner than the currently scheduled appointment.  Patient has been understood.  4. Gastroesophageal reflux disease, esophagitis presence not specified Continue PPI as already ordered, this medication has been reviewed and there are no changes at this time.  Avoidence of caffeine and alcohol  Moderate elevation of the head of the bed    Current Outpatient Medications on File Prior to Visit  Medication Sig Dispense Refill  . acetaminophen (TYLENOL) 500 MG tablet Take by mouth.    Marland Kitchen albuterol (PROVENTIL HFA;VENTOLIN HFA) 108 (90 BASE) MCG/ACT inhaler Inhale 2 puffs into  the lungs every 6 (six) hours as needed for wheezing or  shortness of breath. Reported on 12/31/2015    . amLODipine (NORVASC) 5 MG tablet Take 5 mg by mouth daily.    Marland Kitchen anastrozole (ARIMIDEX) 1 MG tablet Take 1 mg by mouth daily.     . Ascorbic Acid (VITAMIN C) POWD Take 1 application by mouth daily.     Marland Kitchen aspirin 81 MG chewable tablet Chew 81 mg by mouth daily.    Marland Kitchen atorvastatin (LIPITOR) 40 MG tablet Take 40 mg by mouth daily at 6 PM.     . Calcium Carbonate-Vitamin D (CALCIUM 600+D) 600-400 MG-UNIT tablet Take 1 tablet by mouth 2 (two) times daily with a meal.    . cholecalciferol (VITAMIN D) 1000 units tablet Take 2,000 Units by mouth daily. Reported on 12/31/2015    . clopidogrel (PLAVIX) 75 MG tablet Take 75 mg by mouth daily.    . felodipine (PLENDIL) 10 MG 24 hr tablet Take 10 mg by mouth daily. Reported on 12/31/2015    . fluticasone (FLONASE) 50 MCG/ACT nasal spray Place 1-2 sprays into both nostrils daily as needed for rhinitis.    . furosemide (LASIX) 40 MG tablet Take 1 tablet by mouth 1 day or 1 dose.    . gabapentin (NEURONTIN) 300 MG capsule Take 300 mg by mouth 3 (three) times daily.    Marland Kitchen GAVILYTE-N WITH FLAVOR PACK 420 g solution     . hydrALAZINE (APRESOLINE) 25 MG tablet Take 25 mg by mouth 3 (three) times daily.    . hydrochlorothiazide (HYDRODIURIL) 25 MG tablet Take 25 mg by mouth daily.    Marland Kitchen levothyroxine (SYNTHROID, LEVOTHROID) 88 MCG tablet Take 88 mcg by mouth daily before breakfast.    . magnesium 30 MG tablet Take 250 mg by mouth daily.    . metoprolol succinate (TOPROL-XL) 50 MG 24 hr tablet Take 50 mg by mouth daily. Reported on 12/31/2015    . mirtazapine (REMERON) 15 MG tablet TAKE 1 TABLET BY MOUTH EVERY DAY AT NIGHT  3  . omega-3 acid ethyl esters (LOVAZA) 1 g capsule Take 1 g by mouth daily.    Marland Kitchen oxyCODONE (OXY IR/ROXICODONE) 5 MG immediate release tablet Take by mouth.    . pantoprazole (PROTONIX) 40 MG tablet Take 40 mg by mouth daily.    Marland Kitchen PEG 3350-KCl-NaBcb-NaCl-NaSulf (PEG-3350/ELECTROLYTES PO) Take 4,000 mLs by  mouth as directed.    . polyethylene glycol powder (GLYCOLAX/MIRALAX) powder Take 0.5 Containers by mouth as needed.     . potassium chloride SA (K-DUR,KLOR-CON) 20 MEQ tablet Take 20 mEq by mouth as directed.    . simvastatin (ZOCOR) 80 MG tablet Take 80 mg by mouth at bedtime. Reported on 12/31/2015    . sodium chloride (OCEAN) 0.65 % SOLN nasal spray Place 1-2 sprays into both nostrils as needed for congestion.    . traMADol (ULTRAM) 50 MG tablet Take 50 mg by mouth every 6 (six) hours as needed for moderate pain.     . vitamin B-12 (CYANOCOBALAMIN) 1000 MCG tablet Take 1 tablet by mouth 1 day or 1 dose.    . Vitamin D, Ergocalciferol, (DRISDOL) 50000 units CAPS capsule Take 1 capsule by mouth once a week.    . gabapentin (NEURONTIN) 300 MG capsule Take 1 capsule by mouth 1 day or 1 dose.     No current facility-administered medications on file prior to visit.     There are no Patient Instructions on file for this  visit. No follow-ups on file.   Kris Hartmann, NP  This note was completed with Sales executive.  Any errors are purely unintentional.

## 2018-08-16 ENCOUNTER — Ambulatory Visit (HOSPITAL_BASED_OUTPATIENT_CLINIC_OR_DEPARTMENT_OTHER): Payer: Medicare HMO | Admitting: Student in an Organized Health Care Education/Training Program

## 2018-08-16 ENCOUNTER — Other Ambulatory Visit: Payer: Self-pay

## 2018-08-16 ENCOUNTER — Encounter: Payer: Self-pay | Admitting: Student in an Organized Health Care Education/Training Program

## 2018-08-16 ENCOUNTER — Ambulatory Visit
Admission: RE | Admit: 2018-08-16 | Discharge: 2018-08-16 | Disposition: A | Discharge status: Home / Self Care | Payer: Medicare HMO | Source: Ambulatory Visit | Attending: Student in an Organized Health Care Education/Training Program | Admitting: Student in an Organized Health Care Education/Training Program

## 2018-08-16 DIAGNOSIS — M47816 Spondylosis without myelopathy or radiculopathy, lumbar region: Secondary | ICD-10-CM | POA: Insufficient documentation

## 2018-08-16 MED ORDER — LIDOCAINE HCL 2 % IJ SOLN
20.0000 mL | Freq: Once | INTRAMUSCULAR | Status: AC
  Administered 2018-08-16: 400 mg
  Filled 2018-08-16: qty 200

## 2018-08-16 MED ORDER — LACTATED RINGERS IV SOLN
1000.0000 mL | Freq: Once | INTRAVENOUS | Status: AC
  Administered 2018-08-16: 1000 mL via INTRAVENOUS

## 2018-08-16 MED ORDER — FENTANYL CITRATE (PF) 100 MCG/2ML IJ SOLN
25.0000 ug | INTRAMUSCULAR | Status: DC | PRN
  Administered 2018-08-16: 50 ug via INTRAVENOUS
  Filled 2018-08-16: qty 2

## 2018-08-16 MED ORDER — DEXAMETHASONE SODIUM PHOSPHATE 10 MG/ML IJ SOLN
10.0000 mg | Freq: Once | INTRAMUSCULAR | Status: AC
  Administered 2018-08-16: 10 mg
  Filled 2018-08-16: qty 1

## 2018-08-16 MED ORDER — ROPIVACAINE HCL 2 MG/ML IJ SOLN
10.0000 mL | Freq: Once | INTRAMUSCULAR | Status: AC
  Administered 2018-08-16: 10 mL
  Filled 2018-08-16: qty 10

## 2018-08-16 MED ORDER — ROPIVACAINE HCL 2 MG/ML IJ SOLN
INTRAMUSCULAR | Status: AC
  Filled 2018-08-16: qty 10

## 2018-08-16 NOTE — Progress Notes (Addendum)
Safety precautions to be maintained throughout the outpatient stay will include: orient to surroundings, keep bed in low position, maintain call bell within reach at all times, provide assistance with transfer out of bed and ambulation.   Pt encouraged to use med sheet section of AVS as a worksheet at home to document any changes that are made to med list, then to  bring in each visit so that meds can be udated.

## 2018-08-16 NOTE — Patient Instructions (Signed)

## 2018-08-16 NOTE — Progress Notes (Signed)
Patient's Name: Tamara Finley  MRN: [id number]  Referring Provider: Gillis Santa, MD  DOB: 1941-08-14  PCP: Ezequiel Kayser, MD  DOS: 08/16/2018  Note by: Gillis Santa, MD  Service setting: Ambulatory outpatient  Specialty: Interventional Pain Management  Patient type: Established  Location: [facility] (AMB) Pain Management Facility  Visit type: Interventional Procedure   Primary Reason for Visit: Interventional Pain Management Treatment. CC: Back Pain (lower)  Procedure:          Anesthesia, Analgesia, Anxiolysis:  Type: Lumbar Facet, Medial Branch Block(s) #2  Primary Purpose: Diagnostic Region: Posterolateral Lumbosacral Spine Level: L4, L5, S1  Medial Branch Level(s). Injecting these levels blocks the L4-5, L5-S1 lumbar facet joints. Laterality: Bilateral  Type: Moderate (Conscious) Sedation combined with Local Anesthesia Indication(s): Analgesia and Anxiety Route: Intravenous (IV) IV Access: Secured Sedation: Meaningful verbal contact was maintained at all times during the procedure  Local Anesthetic: Lidocaine 2%  Position: Prone   Indications: 1. Lumbar facet arthropathy (L4/L5 and L5/S1)    Pain Score: Pre-procedure: 8 /10 Post-procedure: 0-No pain/10  Pre-op Assessment:  Tamara Finley is a 77 y.o. (year old), female patient, seen today for interventional treatment. She  has a past surgical history that includes Cardiac surgery; Abdominal aortic aneurysm repair; Cardiac catheterization (N/A, 12/05/2015); Cardiac catheterization (Left, 12/31/2015); Breast excisional biopsy (Left, 2010); Breast excisional biopsy (Left, 07/01/2006); Breast biopsy (Left, 09/03/2016); Breast biopsy (Left, 09/25/2016); Bronchoscopy; CAROTID PTA/STENT INTERVENTION; Colonoscopy; Eye surgery; Laminectomy (07/25/2013); Back surgery; Mediastinoscopy; Mastectomy; Thoracoscopy (12/16/2016); and Colonoscopy with propofol (N/A, 06/15/2017). Tamara Finley has a current medication list which includes the following  prescription(s): anastrozole, aspirin, atorvastatin, carvedilol, cholecalciferol, furosemide, gabapentin, gavilyte-n with flavor pack, hydralazine, levothyroxine, mirtazapine, omega-3 acid ethyl esters, pantoprazole, peg [id number]-nacl-nasulf, polyethylene glycol powder, potassium chloride sa, vitamin d (ergocalciferol), acetaminophen, albuterol, amlodipine, vitamin c, calcium carbonate-vitamin d, clopidogrel, felodipine, fluticasone, gabapentin, hydrochlorothiazide, magnesium, metoprolol succinate, oxycodone, simvastatin, sodium chloride, tramadol, and vitamin b-12, and the following Facility-Administered Medications: fentanyl. Her primarily concern today is the Back Pain (lower)  Initial Vital Signs:  Pulse/HCG Rate: 77ECG Heart Rate: 78 Temp: 97.8 F (36.6 C) Resp: 18 BP: (!) 153/68 SpO2: 98 %  BMI: Estimated body mass index is 30.9 kg/m as calculated from the following:   Height as of this encounter: 5\' 4"  (1.626 m).   Weight as of this encounter: 180 lb (81.6 kg).  Risk Assessment: Allergies: Reviewed. She is allergic to benadryl [diphenhydramine hcl (sleep)]; diphenhydramine hcl; and alprazolam.  Allergy Precautions: None required Coagulopathies: Reviewed. None identified.  Blood-thinner therapy: None at this time Active Infection(s): Reviewed. None identified. Tamara Finley is afebrile  Site Confirmation: Tamara Finley was asked to confirm the procedure and laterality before marking the site Procedure checklist: Completed Consent: Before the procedure and under the influence of no sedative(s), amnesic(s), or anxiolytics, the patient was informed of the treatment options, risks and possible complications. To fulfill our ethical and legal obligations, as recommended by the American Medical Association's Code of Ethics, I have informed the patient of my clinical impression; the nature and purpose of the treatment or procedure; the risks, benefits, and possible complications of the  intervention; the alternatives, including doing nothing; the risk(s) and benefit(s) of the alternative treatment(s) or procedure(s); and the risk(s) and benefit(s) of doing nothing. The patient was provided information about the general risks and possible complications associated with the procedure. These may include, but are not limited to: failure to achieve desired goals, infection, bleeding, organ or nerve damage, allergic reactions, paralysis, and death.  In addition, the patient was informed of those risks and complications associated to Spine-related procedures, such as failure to decrease pain; infection (i.e.: Meningitis, epidural or intraspinal abscess); bleeding (i.e.: epidural hematoma, subarachnoid hemorrhage, or any other type of intraspinal or peri-dural bleeding); organ or nerve damage (i.e.: Any type of peripheral nerve, nerve root, or spinal cord injury) with subsequent damage to sensory, motor, and/or autonomic systems, resulting in permanent pain, numbness, and/or weakness of one or several areas of the body; allergic reactions; (i.e.: anaphylactic reaction); and/or death. Furthermore, the patient was informed of those risks and complications associated with the medications. These include, but are not limited to: allergic reactions (i.e.: anaphylactic or anaphylactoid reaction(s)); adrenal axis suppression; blood sugar elevation that in diabetics may result in ketoacidosis or comma; water retention that in patients with history of congestive heart failure may result in shortness of breath, pulmonary edema, and decompensation with resultant heart failure; weight gain; swelling or edema; medication-induced neural toxicity; particulate matter embolism and blood vessel occlusion with resultant organ, and/or nervous system infarction; and/or aseptic necrosis of one or more joints. Finally, the patient was informed that Medicine is not an exact science; therefore, there is also the possibility of  unforeseen or unpredictable risks and/or possible complications that may result in a catastrophic outcome. The patient indicated having understood very clearly. We have given the patient no guarantees and we have made no promises. Enough time was given to the patient to ask questions, all of which were answered to the patient's satisfaction. Ms. Madril has indicated that she wanted to continue with the procedure. Attestation: I, the ordering provider, attest that I have discussed with the patient the benefits, risks, side-effects, alternatives, likelihood of achieving goals, and potential problems during recovery for the procedure that I have provided informed consent. Date  Time: 08/16/2018  9:32 AM  Pre-Procedure Preparation:  Monitoring: As per clinic protocol. Respiration, ETCO2, SpO2, BP, heart rate and rhythm monitor placed and checked for adequate function Safety Precautions: Patient was assessed for positional comfort and pressure points before starting the procedure. Time-out: I initiated and conducted the "Time-out" before starting the procedure, as per protocol. The patient was asked to participate by confirming the accuracy of the "Time Out" information. Verification of the correct person, site, and procedure were performed and confirmed by me, the nursing staff, and the patient. "Time-out" conducted as per Joint Commission's Universal Protocol (UP.01.01.01). Time: 1039  Description of Procedure:          Laterality: Bilateral. The procedure was performed in identical fashion on both sides. Levels: L4, L5, S1 Medial Branch Level(s) Area Prepped: Posterior Lumbosacral Region Prepping solution: ChloraPrep (2% chlorhexidine gluconate and 70% isopropyl alcohol) Safety Precautions: Aspiration looking for blood return was conducted prior to all injections. At no point did we inject any substances, as a needle was being advanced. Before injecting, the patient was told to immediately notify me if  she was experiencing any new onset of "ringing in the ears, or metallic taste in the mouth". No attempts were made at seeking any paresthesias. Safe injection practices and needle disposal techniques used. Medications properly checked for expiration dates. SDV (single dose vial) medications used. After the completion of the procedure, all disposable equipment used was discarded in the proper designated medical waste containers. Local Anesthesia: Protocol guidelines were followed. The patient was positioned over the fluoroscopy table. The area was prepped in the usual manner. The time-out was completed. The target area was identified using fluoroscopy. A 12-in  long, straight, sterile hemostat was used with fluoroscopic guidance to locate the targets for each level blocked. Once located, the skin was marked with an approved surgical skin marker. Once all sites were marked, the skin (epidermis, dermis, and hypodermis), as well as deeper tissues (fat, connective tissue and muscle) were infiltrated with a small amount of a short-acting local anesthetic, loaded on a 10cc syringe with a 25G, 1.5-in  Needle. An appropriate amount of time was allowed for local anesthetics to take effect before proceeding to the next step. Local Anesthetic: Lidocaine 2.0% The unused portion of the local anesthetic was discarded in the proper designated containers. Technical explanation of process:   L4 Medial Branch Nerve Block (MBB): The target area for the L4 medial branch is at the junction of the postero-lateral aspect of the superior articular process and the superior, posterior, and medial edge of the transverse process of L5. Under fluoroscopic guidance, a Quincke needle was inserted until contact was made with os over the superior postero-lateral aspect of the pedicular shadow (target area). After negative aspiration for blood, 1.26mL of the nerve block solution was injected without difficulty or complication. The needle was  removed intact. L5 Medial Branch Nerve Block (MBB): The target area for the L5 medial branch is at the junction of the postero-lateral aspect of the superior articular process and the superior, posterior, and medial edge of the sacral ala. Under fluoroscopic guidance, a Quincke needle was inserted until contact was made with os over the superior postero-lateral aspect of the pedicular shadow (target area). After negative aspiration for blood, 1.54mL of the nerve block solution was injected without difficulty or complication. The needle was removed intact. S1 Medial Branch Nerve Block (MBB): The target area for the S1 medial branch is at the posterior and inferior 6 o'clock position of the L5-S1 facet joint. Under fluoroscopic guidance, the Quincke needle inserted for the L5 MBB was redirected until contact was made with os over the inferior and postero aspect of the sacrum, at the 6 o' clock position under the L5-S1 facet joint (Target area). After negative aspiration for blood, 1.5 mL of the nerve block solution was injected without difficulty or complication. The needle was removed intact.   Procedural Needles: 22-gauge, 3.5-inch, Quincke needles used for all levels. Nerve block solution: 10 cc solution made of 8 cc of 0.2% ropivacaine, 2 cc of Decadron 10 mg/cc.  1.5 cc injected at each level above bilaterally.  The unused portion of the solution was discarded in the proper designated containers.  Once the entire procedure was completed, the treated area was cleaned, making sure to leave some of the prepping solution back to take advantage of its long term bactericidal properties.   Illustration of the posterior view of the lumbar spine and the posterior neural structures. Laminae of L2 through S1 are labeled. DPRL5, dorsal primary ramus of L5; DPRS1, dorsal primary ramus of S1; DPR3, dorsal primary ramus of L3; FJ, facet (zygapophyseal) joint L3-L4; I, inferior articular process of L4; LB1, lateral  branch of dorsal primary ramus of L1; IAB, inferior articular branches from L3 medial branch (supplies L4-L5 facet joint); IBP, intermediate branch plexus; MB3, medial branch of dorsal primary ramus of L3; NR3, third lumbar nerve root; S, superior articular process of L5; SAB, superior articular branches from L4 (supplies L4-5 facet joint also); TP3, transverse process of L3.  Vitals:   08/16/18 1056 08/16/18 1105 08/16/18 1115 08/16/18 1124  BP: (!) 151/79 (!) 143/67 (!) 161/73 133/78  Pulse:      Resp: 15 16 16 16   Temp:  (!) 97.2 F (36.2 C)    TempSrc:      SpO2: 97% 98% 98% 100%  Weight:      Height:         Start Time: 1039 hrs. End Time: 1054 hrs.  Imaging Guidance (Spinal):          Type of Imaging Technique: Fluoroscopy Guidance (Spinal) Indication(s): Assistance in needle guidance and placement for procedures requiring needle placement in or near specific anatomical locations not easily accessible without such assistance. Exposure Time: Please see nurses notes. Contrast: None used. Fluoroscopic Guidance: I was personally present during the use of fluoroscopy. "Tunnel Vision Technique" used to obtain the best possible view of the target area. Parallax error corrected before commencing the procedure. "Direction-depth-direction" technique used to introduce the needle under continuous pulsed fluoroscopy. Once target was reached, antero-posterior, oblique, and lateral fluoroscopic projection used confirm needle placement in all planes. Images permanently stored in EMR. Interpretation: No contrast injected. I personally interpreted the imaging intraoperatively. Adequate needle placement confirmed in multiple planes. Permanent images saved into the patient's record.  Antibiotic Prophylaxis:   Anti-infectives (From admission, onward)   None     Indication(s): None identified  Post-operative Assessment:  Post-procedure Vital Signs:  Pulse/HCG Rate: 7771 Temp: (!) 97.2 F (36.2  C) Resp: 16 BP: 133/78 SpO2: 100 %  EBL: None  Complications: No immediate post-treatment complications observed by team, or reported by patient.  Note: The patient tolerated the entire procedure well. A repeat set of vitals were taken after the procedure and the patient was kept under observation following institutional policy, for this type of procedure. Post-procedural neurological assessment was performed, showing return to baseline, prior to discharge. The patient was provided with post-procedure discharge instructions, including a section on how to identify potential problems. Should any problems arise concerning this procedure, the patient was given instructions to immediately contact us, at any time, without hesitation. In any case, we plan to contact the patient by telephone for a follow-up status report regarding this interventional procedure.  Comments:  No additional relevant information.  Plan of Care    Imaging Orders     DG C-Arm 1-60 Min-No Report Procedure Orders    No procedure(s) ordered today    Medications ordered for procedure: Meds ordered this encounter  Medications  . lactated ringers infusion 1,000 mL  . fentaNYL (SUBLIMAZE) injection 25-100 mcg    Make sure Narcan is available in the pyxis when using this medication. In the event of respiratory depression (RR< 8/min): Titrate NARCAN (naloxone) in increments of 0.1 to 0.2 mg IV at 2-3 minute intervals, until desired degree of reversal.  . lidocaine (XYLOCAINE) 2 % (with pres) injection 400 mg  . ropivacaine (PF) 2 mg/mL (0.2%) (NAROPIN) injection 10 mL  . dexamethasone (DECADRON) injection 10 mg  . dexamethasone (DECADRON) injection 10 mg   Medications administered: We administered lactated ringers, fentaNYL, lidocaine, ropivacaine (PF) 2 mg/mL (0.2%), dexamethasone, and dexamethasone.  See the medical record for exact dosing, route, and time of administration.  Disposition: Discharge home  Discharge  Date & Time: 08/16/2018; 1125 hrs.   Physician-requested Follow-up: Return in about 8 weeks (around 10/11/2018) for Post Procedure Evaluation.  Future Appointments  Date Time Provider [place]  10/12/2018 10:00 AM Gillis Santa, MD [facility] None  04/15/2019  8:30 AM [facility] None  04/15/2019  9:30 AM [facility] None  04/15/2019 10:00 AM Dew, Erskine Squibb, MD [facility] None   Primary Care Physician: Ezequiel Kayser, MD Location: [facility] Note by: Gillis Santa, MD Date: 08/16/2018; Time: 12:28 PM  Disclaimer:  Medicine is not an exact science. The only guarantee in medicine is that nothing is guaranteed. It is important to note that the decision to proceed with this intervention was based on the information collected from the patient. The Data and conclusions were drawn from the patient's questionnaire, the interview, and the physical examination. Because the information was provided in large part by the patient, it cannot be guaranteed that it has not been purposely or unconsciously manipulated. Every effort has been made to obtain as much relevant data as possible for this evaluation. It is important to note that the conclusions that lead to this procedure are derived in large part from the available data. Always take into account that the treatment will also be dependent on availability of resources and existing treatment guidelines, considered by other Pain Management Practitioners as being common knowledge and practice, at the time of the intervention. For Medico-Legal purposes, it is also important to point out that variation in procedural techniques and pharmacological choices are the acceptable norm. The indications, contraindications, technique, and results of the above procedure should only be interpreted and judged by a Board-Certified Interventional Pain Specialist with extensive familiarity and expertise in the same exact procedure and  technique.

## 2018-08-17 ENCOUNTER — Telehealth: Payer: Self-pay

## 2018-08-17 NOTE — Telephone Encounter (Signed)
Post procedure phone call. Patient states she is doing good.  

## 2018-09-28 ENCOUNTER — Other Ambulatory Visit: Payer: Self-pay

## 2018-09-28 ENCOUNTER — Encounter: Payer: Self-pay | Admitting: Student in an Organized Health Care Education/Training Program

## 2018-09-28 ENCOUNTER — Ambulatory Visit
Payer: Medicare HMO | Attending: Student in an Organized Health Care Education/Training Program | Admitting: Student in an Organized Health Care Education/Training Program

## 2018-09-28 VITALS — BP 153/83 | HR 80 | Temp 98.0°F | Resp 18 | Ht 64.0 in | Wt 178.0 lb

## 2018-09-28 DIAGNOSIS — G894 Chronic pain syndrome: Secondary | ICD-10-CM

## 2018-09-28 DIAGNOSIS — M533 Sacrococcygeal disorders, not elsewhere classified: Secondary | ICD-10-CM | POA: Insufficient documentation

## 2018-09-28 DIAGNOSIS — M47816 Spondylosis without myelopathy or radiculopathy, lumbar region: Secondary | ICD-10-CM | POA: Insufficient documentation

## 2018-09-28 DIAGNOSIS — G8929 Other chronic pain: Secondary | ICD-10-CM | POA: Insufficient documentation

## 2018-09-28 DIAGNOSIS — Z79899 Other long term (current) drug therapy: Secondary | ICD-10-CM | POA: Diagnosis present

## 2018-09-28 DIAGNOSIS — M5416 Radiculopathy, lumbar region: Secondary | ICD-10-CM | POA: Diagnosis present

## 2018-09-28 MED ORDER — HYDROCODONE-ACETAMINOPHEN 5-325 MG PO TABS: 1.0000 | ORAL_TABLET | Freq: Every day | ORAL | 0 refills | Status: DC | PRN

## 2018-09-28 NOTE — Progress Notes (Signed)
Patient's Name: STEFFIE WAGGONER  MRN: [id number]  Referring Provider: Ezequiel Kayser, MD  DOB: [birth year]  PCP: Ezequiel Kayser, MD  DOS: 09/28/2018  Note by: Gillis Santa, MD  Service setting: Ambulatory outpatient  Specialty: Interventional Pain Management  Location: [facility] (AMB) Pain Management Facility    Patient type: Established   Primary Reason(s) for Visit: Encounter for post-procedure evaluation of chronic illness with mild to moderate exacerbation CC: Back Pain (low) and Leg Pain  HPI  Ms. Bains is a 78 y.o. year old, female patient, who comes today for a post-procedure evaluation. She has TIA (transient ischemic attack); Renovascular hypertension, malignant; Malignant hypertension; Carotid stenosis; Adenomatous colon polyp; Anxiety and depression; Breast calcification, left; Carotid atherosclerosis, bilateral; Chronic insomnia; COPD (chronic obstructive pulmonary disease) (HCC); CRI (chronic renal insufficiency), stage 3 (moderate) ([place]); Elevated blood sugar; GERD (gastroesophageal reflux disease); Heart palpitations; History of AAA (abdominal aortic aneurysm) repair; History of TIA (transient ischemic attack); Hyperlipidemia, unspecified; Hypertension; Hypothyroidism; Left breast mass; Low back pain radiating to both legs; Lumbar stenosis; Migraine headache; Osteoarthritis; Osteoporosis, postmenopausal; Perennial allergic rhinitis; Peripheral arterial disease ([place]); Primary localized osteoarthrosis, pelvic region and thigh; Primary osteoarthritis of right hip; Right hip pain; Chronic SI joint pain; SOB (shortness of breath); Stenosis of left subclavian artery ([place]); Type 2 diabetes mellitus with vascular disease ([place]); ERRONEOUS ENCOUNTER--DISREGARD; Primary cancer of upper inner quadrant of left female breast ([place]); Renal artery stenosis ([place]); Lumbar facet arthropathy (L4/L5 and L5/S1); and Controlled substance agreement signed on their problem list. Her primarily concern today is the Back Pain  (low) and Leg Pain  Pain Assessment: Location: Lower Back Radiating: radiates down both legs to knee in the back Onset: More than a month ago Duration: Chronic pain Quality: Aching, Dull(grabbing) Severity: 9 /10 (subjective, self-reported pain score)  Note: Reported level is inconsistent with clinical observations. Clinically the patient looks like a 3/10 A 3/10 is viewed as "Moderate" and described as significantly interfering with activities of daily living (ADL). It becomes difficult to feed, bathe, get dressed, get on and off the toilet or to perform personal hygiene functions. Difficult to get in and out of bed or a chair without assistance. Very distracting. With effort, it can be ignored when deeply involved in activities. Information on the proper use of the pain scale provided to the patient today. When using our objective Pain Scale, levels between 6 and 10/10 are said to belong in an emergency room, as it progressively worsens from a 6/10, described as severely limiting, requiring emergency care not usually available at an outpatient pain management facility. At a 6/10 level, communication becomes difficult and requires great effort. Assistance to reach the emergency department may be required. Facial flushing and profuse sweating along with potentially dangerous increases in heart rate and blood pressure will be evident. Effect on ADL: Limits activities Timing: Constant Modifying factors: lying down helps BP: (!) 153/83  HR: 80  Ms. Muhlestein comes in today for post-procedure evaluation.  Further details on both, my assessment(s), as well as the proposed treatment plan, please see below.  Post-Procedure Assessment  08/16/2018 Procedure: Bilateral L4, L5  lumbar facet medial branch nerve, bilateral S1 lateral branch block #2 Pre-procedure pain score:  8/10 Post-procedure pain score: 0/10         Influential Factors: BMI: 30.55 kg/m Intra-procedural challenges: None observed.          Assessment challenges: None detected.              Reported side-effects:  None.        Post-procedural adverse reactions or complications: None reported         Sedation: Please see nurses note. When no sedatives are used, the analgesic levels obtained are directly associated to the effectiveness of the local anesthetics. However, when sedation is provided, the level of analgesia obtained during the initial 1 hour following the intervention, is believed to be the result of a combination of factors. These factors may include, but are not limited to: 1. The effectiveness of the local anesthetics used. 2. The effects of the analgesic(s) and/or anxiolytic(s) used. 3. The degree of discomfort experienced by the patient at the time of the procedure. 4. The patients ability and reliability in recalling and recording the events. 5. The presence and influence of possible secondary gains and/or psychosocial factors. Reported result: Relief experienced during the 1st hour after the procedure: 100 % (Ultra-Short Term Relief)            Interpretative annotation: Clinically appropriate result. Analgesia during this period is likely to be Local Anesthetic and/or IV Sedative (Analgesic/Anxiolytic) related.          Effects of local anesthetic: The analgesic effects attained during this period are directly associated to the localized infiltration of local anesthetics and therefore cary significant diagnostic value as to the etiological location, or anatomical origin, of the pain. Expected duration of relief is directly dependent on the pharmacodynamics of the local anesthetic used. Long-acting (4-6 hours) anesthetics used.  Reported result: Relief during the next 4 to 6 hour after the procedure: 100 % (Short-Term Relief)            Interpretative annotation: Clinically appropriate result. Analgesia during this period is likely to be Local Anesthetic-related.          Long-term benefit: Defined as the period of  time past the expected duration of local anesthetics (1 hour for short-acting and 4-6 hours for long-acting). With the possible exception of prolonged sympathetic blockade from the local anesthetics, benefits during this period are typically attributed to, or associated with, other factors such as analgesic sensory neuropraxia, antiinflammatory effects, or beneficial biochemical changes provided by agents other than the local anesthetics.  Reported result: Extended relief following procedure: 0 % (Long-Term Relief)            Interpretative annotation: Clinically possible results. No benefit. Therapeutic failure. Pain appears to be refractory to this treatment modality.          Current benefits: Defined as reported results that persistent at this point in time.   Analgesia: 0 %            Function: No benefit ROM: No benefit Interpretative annotation: Recurrence of symptoms. Therapeutic failure. Results would suggest persistent aggravating factors.          Interpretation: Results would suggest failure of therapy in achieving desired goal(s).                  Plan:  Please see "Plan of Care" for details.                Laboratory Chemistry  Inflammation Markers (CRP: Acute Phase) (ESR: Chronic Phase) Lab Results  Component Value Date   ESRSEDRATE 23 10/12/2015                         Rheumatology Markers No results found for: RF, ANA, [place], [place], [place], [place], HLAB27  Renal Function Markers Lab Results  Component Value Date   BUN 9 01/01/2016   CREATININE 1.03 (H) 01/01/2016   GFRAA >60 01/01/2016   GFRNONAA 53 (L) 01/01/2016                             Hepatic Function Markers Lab Results  Component Value Date   AST 28 10/17/2015   ALT 17 10/17/2015   ALBUMIN 3.7 10/17/2015   ALKPHOS 64 10/17/2015                        Electrolytes Lab Results  Component Value Date   NA 139 01/01/2016   K 3.5 01/01/2016   CL 106 01/01/2016    CALCIUM 8.2 (L) 01/01/2016                        Neuropathy Markers Lab Results  Component Value Date   HGBA1C 5.3 10/17/2015                        CNS Tests No results found for: COLORCSF, APPEARCSF, RBCCOUNTCSF, WBCCSF, POLYSCSF, LYMPHSCSF, EOSCSF, PROTEINCSF, GLUCCSF, JCVIRUS, CSFOLI, IGGCSF                      Bone Pathology Markers No results found for: VD25OH, [id number], [id number], [id number], 25OHVITD1, 25OHVITD2, 25OHVITD3, TESTOFREE, TESTOSTERONE                       Coagulation Parameters Lab Results  Component Value Date   INR 1.04 10/17/2015   LABPROT 13.8 10/17/2015   APTT 29 10/17/2015   PLT 241 01/01/2016                        Cardiovascular Markers Lab Results  Component Value Date   TROPONINI 0.05 (H) 10/18/2015   HGB 11.2 (L) 01/01/2016   HCT 33.9 (L) 01/01/2016                         CA Markers No results found for: CEA, CA125, LABCA2                      Note: Lab results reviewed.  Recent Diagnostic Imaging Results  DG C-Arm 1-60 Min-No Report Fluoroscopy was utilized by the requesting physician.  No radiographic  interpretation.   Complexity Note: Imaging results reviewed. Results shared with Ms. Brunelle, using Layman's terms.                         Meds   Current Outpatient Medications:  .  acetaminophen (TYLENOL) 500 MG tablet, Take by mouth., Disp: , Rfl:  .  albuterol (PROVENTIL HFA;VENTOLIN HFA) 108 (90 BASE) MCG/ACT inhaler, Inhale 2 puffs into the lungs every 6 (six) hours as needed for wheezing or shortness of breath. Reported on 12/31/2015, Disp: , Rfl:  .  amLODipine (NORVASC) 5 MG tablet, Take 5 mg by mouth daily., Disp: , Rfl:  .  anastrozole (ARIMIDEX) 1 MG tablet, Take 1 mg by mouth daily. , Disp: , Rfl:  .  Ascorbic Acid (VITAMIN C) POWD, Take 1 application by mouth daily. , Disp: , Rfl:  .  aspirin 81 MG chewable tablet, Chew 81 mg by mouth daily., Disp: , Rfl:  .  atorvastatin (LIPITOR) 40 MG tablet, Take 40 mg by mouth  daily at 6 PM. , Disp: , Rfl:  .  Calcium Carbonate-Vitamin D (CALCIUM 600+D) 600-400 MG-UNIT tablet, Take 1 tablet by mouth 2 (two) times daily with a meal., Disp: , Rfl:  .  carvedilol (COREG) 6.25 MG tablet, Take 6.25 mg by mouth 2 (two) times daily with a meal., Disp: , Rfl:  .  cholecalciferol (VITAMIN D) 1000 units tablet, Take 2,000 Units by mouth daily. Reported on 12/31/2015, Disp: , Rfl:  .  fluticasone (FLONASE) 50 MCG/ACT nasal spray, Place 1-2 sprays into both nostrils daily as needed for rhinitis., Disp: , Rfl:  .  furosemide (LASIX) 40 MG tablet, Take 1 tablet by mouth 1 day or 1 dose., Disp: , Rfl:  .  gabapentin (NEURONTIN) 300 MG capsule, Take 1 capsule by mouth 1 day or 1 dose., Disp: , Rfl:  .  GAVILYTE-N WITH FLAVOR PACK 420 g solution, , Disp: , Rfl:  .  hydrALAZINE (APRESOLINE) 25 MG tablet, Take 25 mg by mouth 3 (three) times daily., Disp: , Rfl:  .  hydrochlorothiazide (HYDRODIURIL) 25 MG tablet, Take 25 mg by mouth daily., Disp: , Rfl:  .  levothyroxine (SYNTHROID, LEVOTHROID) 88 MCG tablet, Take 88 mcg by mouth daily before breakfast., Disp: , Rfl:  .  magnesium 30 MG tablet, Take 250 mg by mouth daily., Disp: , Rfl:  .  metoprolol succinate (TOPROL-XL) 50 MG 24 hr tablet, Take 50 mg by mouth daily. Reported on 12/31/2015, Disp: , Rfl:  .  mirtazapine (REMERON) 15 MG tablet, TAKE 1 TABLET BY MOUTH EVERY DAY AT NIGHT, Disp: , Rfl: 3 .  pantoprazole (PROTONIX) 40 MG tablet, Take 40 mg by mouth daily., Disp: , Rfl:  .  polyethylene glycol powder (GLYCOLAX/MIRALAX) powder, Take 0.5 Containers by mouth as needed. , Disp: , Rfl:  .  potassium chloride SA (K-DUR,KLOR-CON) 20 MEQ tablet, Take 20 mEq by mouth as directed., Disp: , Rfl:  .  simvastatin (ZOCOR) 80 MG tablet, Take 80 mg by mouth at bedtime. Reported on 12/31/2015, Disp: , Rfl:  .  sodium chloride (OCEAN) 0.65 % SOLN nasal spray, Place 1-2 sprays into both nostrils as needed for congestion., Disp: , Rfl:  .  vitamin B-12  (CYANOCOBALAMIN) 1000 MCG tablet, Take 1 tablet by mouth 1 day or 1 dose., Disp: , Rfl:  .  Vitamin D, Ergocalciferol, (DRISDOL) 50000 units CAPS capsule, Take 1 capsule by mouth once a week., Disp: , Rfl:  .  gabapentin (NEURONTIN) 300 MG capsule, Take 300 mg by mouth 3 (three) times daily., Disp: , Rfl:  .  HYDROcodone-acetaminophen (NORCO/VICODIN) 5-325 MG tablet, Take 1 tablet by mouth daily as needed for moderate pain., Disp: 30 tablet, Rfl: 0 .  omega-3 acid ethyl esters (LOVAZA) 1 g capsule, Take 1 g by mouth daily., Disp: , Rfl:  .  oxyCODONE (OXY IR/ROXICODONE) 5 MG immediate release tablet, Take by mouth., Disp: , Rfl:  .  PEG 3350-KCl-NaBcb-NaCl-NaSulf (PEG-3350/ELECTROLYTES PO), Take 4,000 mLs by mouth as directed., Disp: , Rfl:  .  traMADol (ULTRAM) 50 MG tablet, Take 50 mg by mouth every 6 (six) hours as needed for moderate pain. , Disp: , Rfl:   ROS  Constitutional: Denies any fever or chills Gastrointestinal: No reported hemesis, hematochezia, vomiting, or acute GI distress Musculoskeletal: Denies any acute onset joint swelling, redness, loss of ROM, or weakness Neurological: No reported episodes of acute onset apraxia, aphasia, dysarthria, agnosia, amnesia, paralysis, loss  of coordination, or loss of consciousness  Allergies  Ms. Sundell is allergic to benadryl [diphenhydramine hcl (sleep)]; diphenhydramine hcl; and alprazolam.  PFSH  Drug: Ms. Sheriff  reports no history of drug use. Alcohol:  reports no history of alcohol use. Tobacco:  reports that she quit smoking about 5 years ago. Her smoking use included cigarettes. She has a 100.00 pack-year smoking history. She has never used smokeless tobacco. Medical:  has a past medical history of Abdominal aortic aneurysm (AAA) ([place]) (2000's), Age related entropion, unspecified laterality, Anxiety, Anxiety and depression, Bilateral renal artery stenosis ([place]), Breast calcification, left, Cancer ([place]) (10/20/2016), Carotid artery  thrombosis, bilateral, Carotid stenosis, Chronic bronchitis (HCC), Chronic insomnia, COPD (chronic obstructive pulmonary disease) ([place]), Coronary artery disease, CRI (chronic renal insufficiency), stage 3 (moderate) ([place]), Depression, Diabetes mellitus without complication ([place]), GERD (gastroesophageal reflux disease), Heart palpitations, History of AAA (abdominal aortic aneurysm) repair, History of colon polyps, History of TIA (transient ischemic attack), Hypercholesterolemia, Hypertension, Hypothyroidism, Migraine headache, Osteoarthritis, Osteoporosis, Peripheral arterial disease ([place]), Pulmonary scarring, Renal disorder, SOB (shortness of breath), Stenosis of left subclavian artery ([place]), Stroke ([place]), Thyroid disease, TIA (transient ischemic attack), and Type 2 diabetes mellitus with vascular disease ([place]). Surgical: Ms. Vary  has a past surgical history that includes Cardiac surgery; Abdominal aortic aneurysm repair; Cardiac catheterization (N/A, 12/05/2015); Cardiac catheterization (Left, 12/31/2015); Breast excisional biopsy (Left, 2010); Breast excisional biopsy (Left, 07/01/2006); Breast biopsy (Left, 09/03/2016); Breast biopsy (Left, 09/25/2016); Bronchoscopy; CAROTID PTA/STENT INTERVENTION; Colonoscopy; Eye surgery; Laminectomy (07/25/2013); Back surgery; Mediastinoscopy; Mastectomy; Thoracoscopy (12/16/2016); and Colonoscopy with propofol (N/A, 06/15/2017). Family: family history includes CVA in her mother; Cancer in her brother; Coronary artery disease in her father; Dementia in her father; Diabetes in her brother; Epilepsy in her mother; Hypertension in her father and mother; Ovarian cancer in her sister; Stroke in her father and mother.  Constitutional Exam  General appearance: Well nourished, well developed, and well hydrated. In no apparent acute distress Vitals:   09/28/18 1425 09/28/18 1428  BP:  (!) 153/83  Pulse: 80   Resp: 18   Temp: 98 F (36.7 C)   SpO2: 98%   Weight: 178 lb  (80.7 kg)   Height: _0  (1.626 m)    BMI Assessment: Estimated body mass index is 30.55 kg/m as calculated from the following:   Height as of this encounter: _1  (1.626 m).   Weight as of this encounter: 178 lb (80.7 kg).  BMI interpretation table: BMI level Category Range association with higher incidence of chronic pain  <18 kg/m2 Underweight   18.5-24.9 kg/m2 Ideal body weight   25-29.9 kg/m2 Overweight Increased incidence by 20%  30-34.9 kg/m2 Obese (Class I) Increased incidence by 68%  35-39.9 kg/m2 Severe obesity (Class II) Increased incidence by 136%  >40 kg/m2 Extreme obesity (Class III) Increased incidence by 254%   Patient's current BMI Ideal Body weight  Body mass index is 30.55 kg/m. Ideal body weight: 54.7 kg (120 lb 9.5 oz) Adjusted ideal body weight: 65.1 kg (143 lb 8.9 oz)   BMI Readings from Last 4 Encounters:  09/28/18 30.55 kg/m  08/16/18 30.90 kg/m  08/06/18 31.24 kg/m  08/02/18 30.21 kg/m   Wt Readings from Last 4 Encounters:  09/28/18 178 lb (80.7 kg)  08/16/18 180 lb (81.6 kg)  08/06/18 182 lb (82.6 kg)  08/02/18 176 lb (79.8 kg)  Psych/Mental status: Alert, oriented x 3 (person, place, & time)       Eyes: PERLA Respiratory: No evidence of acute  respiratory distress  Cervical Spine Area Exam  Skin & Axial Inspection: No masses, redness, edema, swelling, or associated skin lesions Alignment: Symmetrical Functional ROM: Unrestricted ROM      Stability: No instability detected Muscle Tone/Strength: Functionally intact. No obvious neuro-muscular anomalies detected. Sensory (Neurological): Unimpaired Palpation: No palpable anomalies              Upper Extremity (UE) Exam    Side: Right upper extremity  Side: Left upper extremity  Skin & Extremity Inspection: Skin color, temperature, and hair growth are WNL. No peripheral edema or cyanosis. No masses, redness, swelling, asymmetry, or associated skin lesions. No contractures.  Skin & Extremity  Inspection: Skin color, temperature, and hair growth are WNL. No peripheral edema or cyanosis. No masses, redness, swelling, asymmetry, or associated skin lesions. No contractures.  Functional ROM: Unrestricted ROM          Functional ROM: Unrestricted ROM          Muscle Tone/Strength: Functionally intact. No obvious neuro-muscular anomalies detected.  Muscle Tone/Strength: Functionally intact. No obvious neuro-muscular anomalies detected.  Sensory (Neurological): Unimpaired          Sensory (Neurological): Unimpaired          Palpation: No palpable anomalies              Palpation: No palpable anomalies              Provocative Test(s):  Phalen's test: deferred Tinel's test: deferred Apley's scratch test (touch opposite shoulder):  Action 1 (Across chest): deferred Action 2 (Overhead): deferred Action 3 (LB reach): deferred   Provocative Test(s):  Phalen's test: deferred Tinel's test: deferred Apley's scratch test (touch opposite shoulder):  Action 1 (Across chest): deferred Action 2 (Overhead): deferred Action 3 (LB reach): deferred    Thoracic Spine Area Exam  Skin & Axial Inspection: No masses, redness, or swelling Alignment: Symmetrical Functional ROM: Unrestricted ROM Stability: No instability detected Muscle Tone/Strength: Functionally intact. No obvious neuro-muscular anomalies detected. Sensory (Neurological): Unimpaired Muscle strength & Tone: No palpable anomalies  Lumbar Spine Area Exam  Skin & Axial Inspection: Well healed scar from previous spine surgery detected Alignment: Asymmetric Functional ROM: Decreased ROM affecting both sides Stability: No instability detected Muscle Tone/Strength: Increased muscle tone over affected area Sensory (Neurological): Dermatomal pain pattern and musculoskeletal Palpation: Complains of area being tender to palpation       Provocative Tests: Hyperextension/rotation test: (+) bilaterally for facet joint pain. Lumbar quadrant  test (Kemp's test): (+) bilateral for foraminal stenosis Lateral bending test: (+) due to fusion restriction. Patrick's Maneuver: (+) for bilateral S-I arthralgia             FABER* test: deferred today                   S-I anterior distraction/compression test: deferred today         S-I lateral compression test: deferred today         S-I Thigh-thrust test: deferred today         S-I Gaenslen's test: deferred today         *(Flexion, ABduction and External Rotation)  Gait & Posture Assessment  Ambulation: Limited Gait: Antalgic Posture: Difficulty standing up straight, due to pain   Lower Extremity Exam    Side: Right lower extremity  Side: Left lower extremity  Stability: No instability observed          Stability: No instability observed  Skin & Extremity Inspection: Skin color, temperature, and hair growth are WNL. No peripheral edema or cyanosis. No masses, redness, swelling, asymmetry, or associated skin lesions. No contractures.  Skin & Extremity Inspection: Skin color, temperature, and hair growth are WNL. No peripheral edema or cyanosis. No masses, redness, swelling, asymmetry, or associated skin lesions. No contractures.  Functional ROM: Decreased ROM for hip and knee joints          Functional ROM: Decreased ROM for hip and knee joints          Muscle Tone/Strength: Functionally intact. No obvious neuro-muscular anomalies detected.  Muscle Tone/Strength: Functionally intact. No obvious neuro-muscular anomalies detected.  Sensory (Neurological): Unimpaired        Sensory (Neurological): Unimpaired        DTR: Patellar: 1+: trace  Achilles: deferred today Plantar: deferred today  DTR: Patellar: 0: absent Achilles: deferred today Plantar: deferred today  Palpation: No palpable anomalies  Palpation: No palpable anomalies   Assessment  Primary Diagnosis & Pertinent Problem List: The primary encounter diagnosis was Chronic SI joint pain. Diagnoses of Lumbar facet  arthropathy (L4/L5 and L5/S1), Lumbar spondylosis, Lumbar radiculopathy, Chronic pain syndrome, and Controlled substance agreement signed were also pertinent to this visit.  Status Diagnosis  Persistent Persistent Persistent 1. Chronic SI joint pain   2. Lumbar facet arthropathy (L4/L5 and L5/S1)   3. Lumbar spondylosis   4. Lumbar radiculopathy   5. Chronic pain syndrome   6. Controlled substance agreement signed     Problems updated and reviewed during this visit: Problem  Lumbar facet arthropathy (L4/L5 and L5/S1)  Controlled Substance Agreement Signed  Chronic Si Joint Pain   History of L4-L5 laminectomies, L3 compression fracture, L5-S1 facet hypertrophy.  No significant benefit after 2 diagnostic lumbar facet medial branch nerve blocks at L4, L5, S1.  No significant benefit after lumbar epidural steroid injection.  Discussed bilateral SI joint injection given positive Patrick's and buttock related pain.  Also briefly discussed the radical lumbar spinal cord stimulation.  We will also have patient sign opiate agreement and offer low-dose hydrocodone 5 mg daily PRN for severe pain.  Plan of Care  Pharmacotherapy (Medications Ordered): Meds ordered this encounter  Medications  . HYDROcodone-acetaminophen (NORCO/VICODIN) 5-325 MG tablet    Sig: Take 1 tablet by mouth daily as needed for moderate pain.    Dispense:  30 tablet    Refill:  0    Do not place this medication, or any other prescription from our practice, on "Automatic Refill". Patient may have prescription filled one day early if pharmacy is closed on scheduled refill date.   Lab-work, procedure(s), and/or referral(s): Orders Placed This Encounter  Procedures  . SACROILIAC JOINT INJECTION   Interventional management options:  Considering:   Bilateral sacroiliac joint injection Spinal Cord Stimulator Trial TF ESI   PRN Procedures:   To be determined at a later time   Provider-requested follow-up: Return in  about 4 weeks (around 10/26/2018) for Medication Management.  Future Appointments  Date Time Provider [place]  10/12/2018 10:00 AM Gillis Santa, MD [facility] None  04/15/2019  8:30 AM [facility] None  04/15/2019  9:30 AM [facility] None  04/15/2019 10:00 AM Dew, Erskine Squibb, MD [facility] None   Time Note: Greater than 50% of the 25 minute(s) of face-to-face time spent with Ms. Seguin, was spent in counseling/coordination of care regarding: Ms. Vanblarcom primary cause of pain, the results of her recent test(s), the treatment  plan, treatment alternatives, the risks and possible complications of proposed treatment, medication side effects, going over the informed consent, the opioid analgesic risks and possible complications, the results, interpretation and significance of  her recent diagnostic interventional treatment(s), the appropriate use of her medications, realistic expectations, the goals of pain management (increased in functionality), the medication agreement and the patient's responsibilities when it comes to controlled substances.  Primary Care Physician: Ezequiel Kayser, MD Location: [facility] Note by: Gillis Santa, M.D Date: 09/28/2018; Time: 3:16 PM  Patient Instructions  -As need SI joint injection -Sign opioid agreement -Info on SI joint injection below  ____________________________________________________________________________________________  Preparing for your procedure (without sedation)  Instructions: . Oral Intake: Do not eat or drink anything for at least 3 hours prior to your procedure. . Transportation: Unless otherwise stated by your physician, you may drive yourself after the procedure. . Blood Pressure Medicine: Take your blood pressure medicine with a sip of water the morning of the procedure. . Blood thinners: Notify our staff if you are taking any blood thinners. Depending on which one you take, there will be  specific instructions on how and when to stop it. . Diabetics on insulin: Notify the staff so that you can be scheduled 1st case in the morning. If your diabetes requires high dose insulin, take only  of your normal insulin dose the morning of the procedure and notify the staff that you have done so. . Preventing infections: Shower with an antibacterial soap the morning of your procedure.  . Build-up your immune system: Take 1000 mg of Vitamin C with every meal (3 times a day) the day prior to your procedure. Marland Kitchen Antibiotics: Inform the staff if you have a condition or reason that requires you to take antibiotics before dental procedures. . Pregnancy: If you are pregnant, call and cancel the procedure. . Sickness: If you have a cold, fever, or any active infections, call and cancel the procedure. . Arrival: You must be in the facility at least 30 minutes prior to your scheduled procedure. . Children: Do not bring any children with you. . Dress appropriately: Bring dark clothing that you would not mind if they get stained. . Valuables: Do not bring any jewelry or valuables.  Procedure appointments are reserved for interventional treatments only. Marland Kitchen No Prescription Refills. . No medication changes will be discussed during procedure appointments. . No disability issues will be discussed.  Reasons to call and reschedule or cancel your procedure: (Following these recommendations will minimize the risk of a serious complication.) . Surgeries: Avoid having procedures within 2 weeks of any surgery. (Avoid for 2 weeks before or after any surgery). . Flu Shots: Avoid having procedures within 2 weeks of a flu shots or . (Avoid for 2 weeks before or after immunizations). . Barium: Avoid having a procedure within 7-10 days after having had a radiological study involving the use of radiological contrast. (Myelograms, Barium swallow or enema study). . Heart attacks: Avoid any elective procedures or surgeries for  the initial 6 months after a "Myocardial Infarction" (Heart Attack). . Blood thinners: It is imperative that you stop these medications before procedures. Let us know if you if you take any blood thinner.  . Infection: Avoid procedures during or within two weeks of an infection (including chest colds or gastrointestinal problems). Symptoms associated with infections include: Localized redness, fever, chills, night sweats or profuse sweating, burning sensation when voiding, cough, congestion, stuffiness, runny nose, sore throat, diarrhea, nausea, vomiting,  cold or Flu symptoms, recent or current infections. It is specially important if the infection is over the area that we intend to treat. Marland Kitchen Heart and lung problems: Symptoms that may suggest an active cardiopulmonary problem include: cough, chest pain, breathing difficulties or shortness of breath, dizziness, ankle swelling, uncontrolled high or unusually low blood pressure, and/or palpitations. If you are experiencing any of these symptoms, cancel your procedure and contact your primary care physician for an evaluation.  Remember:  Regular Business hours are:  Monday to Thursday 8:00 AM to 4:00 PM  Provider's Schedule: Milinda Pointer, MD:  Procedure days: Tuesday and Thursday 7:30 AM to 4:00 PM  Gillis Santa, MD:  Procedure days: Monday and Wednesday 7:30 AM to 4:00 PM ____________________________________________________________________________________________    Sacroiliac Joint Injection  A sacroiliac (SI) joint injection is a procedure to inject a numbing medicine (anesthetic block)-and sometimes a strong anti-inflammatory medicine (steroid)-into the SI joint. The SI joint is the joint between two bones of the pelvis called the sacrum and the ilium. The sacrum is the bone at the base of the spine. The ilium is the large bone that forms the hip. You may need this procedure if you have pain because of an inflamed or diseased SI joint.  Various conditions can cause pain in the SI joint, including rheumatoid arthritis, gout, psoriatic arthritis, infection, or injury. SI joint pain is a common cause of low back pain. It may also cause pain in your buttock or leg. SI joint injection may be done to:  Find out if an anesthetic block relieves pain. This can confirm that the SI joint is the cause of pain (diagnostic use).  Treat a painful SI joint with steroids, anesthetic medicine, or both (therapeutic use). Tell a health care provider about:  Any allergies you have.  All medicines you are taking, including vitamins, herbs, eye drops, creams, and over-the-counter medicines.  Any problems you or family members have had with anesthetic medicines.  Any blood disorders you have.  Any surgeries you have had.  Any medical conditions you have.  Whether you are pregnant or may be pregnant. What are the risks? Generally, this is a safe procedure. However, problems may occur, including:  Infection.  Bleeding.  Nerve injury.  Temporary increase in pain.  Headache.  Failure of the procedure to relieve pain.  Bruising or soreness at the joint, in deep tissues, or at the injection site.  Allergic reactions to medicines or dyes.  Side effects from the steroid medicine. These may include facial flushing, increased appetite, diarrhea, and increased blood sugar. What happens before the procedure?  You may have a physical exam.  You may have imaging tests, such as an X-ray, CT scan, or MRI.  Ask your health care provider about: ? Changing or stopping your regular medicines. This is especially important if you are taking diabetes medicines or blood thinners. ? Taking medicines such as aspirin and ibuprofen. These medicines can thin your blood. Do not take these medicines unless your health care provider tells you to take them. ? Taking over-the-counter medicines, vitamins, herbs, and supplements.  Plan to have someone take  you home from the hospital or clinic. What happens during the procedure?  To lower your risk of infection: ? Your health care team will wash or sanitize their hands. ? Your skin will be washed with a germ-killing (antiseptic) solution.  You may be given one or more of the following: ? A medicine to help you relax (sedative). ?  A medicine to numb the area (local anesthetic). Your health care provider will inject a local anesthetic into the skin above your SI joint.  You will be placed in the proper position on a procedure table to give the health care team the best access to your SI joint.  An X-ray machine that produces moving X-ray images (fluoroscopy) will be placed above the procedure table.  A long, thin needle will be inserted through your skin and down to your SI joint.  The position of the needle will be checked with fluoroscopy imaging.  An X-ray dye (contrast media) will be injected to make sure the needle enters the joint space. You may be asked if you feel any pain.  Long-acting anesthetic medicine will be injected. Long-acting steroid medicine may also be injected.  The needle will be removed, and a bandage will be placed over the injection site. The procedure may vary among health care providers and hospitals. What happens after the procedure?  Your blood pressure, heart rate, breathing rate, and blood oxygen level will be monitored until the medicines you were given have worn off.  If dye was used, you will be told to drink plenty of water to wash (flush) the dye out of your body.  You may be asked if you have pain relief from the injection.  You will likely be able to go home shortly after the procedure.  Your health care provider will give you instructions for taking care of yourself after the procedure. These may include instructions for doing physical therapy exercises.  Do not drive for 24 hours if you were given a sedative during the procedure. Summary  A  sacroiliac (SI) joint injection is an injection of a numbing medicine (anesthetic block)-and sometimes a strong anti-inflammatory medicine (steroid)-into the SI joint.  You will be awake during the procedure, but the injection area will be made numb.  If you were given a medicine to help you relax (sedative during the procedure, do not drive for at least 24 hours. This information is not intended to replace advice given to you by your health care provider. Make sure you discuss any questions you have with your health care provider. Document Released: 05/25/2017 Document Revised: 05/25/2017 Document Reviewed: 05/25/2017 Elsevier Interactive Patient Education  2019 [employer].

## 2018-09-28 NOTE — Progress Notes (Signed)
Safety precautions to be maintained throughout the outpatient stay will include: orient to surroundings, keep bed in low position, maintain call bell within reach at all times, provide assistance with transfer out of bed and ambulation.  

## 2018-09-28 NOTE — Patient Instructions (Addendum)
-As need SI joint injection -Sign opioid agreement -Info on SI joint injection below  ____________________________________________________________________________________________  Preparing for your procedure (without sedation)  Instructions: . Oral Intake: Do not eat or drink anything for at least 3 hours prior to your procedure. . Transportation: Unless otherwise stated by your physician, you may drive yourself after the procedure. . Blood Pressure Medicine: Take your blood pressure medicine with a sip of water the morning of the procedure. . Blood thinners: Notify our staff if you are taking any blood thinners. Depending on which one you take, there will be specific instructions on how and when to stop it. . Diabetics on insulin: Notify the staff so that you can be scheduled 1st case in the morning. If your diabetes requires high dose insulin, take only  of your normal insulin dose the morning of the procedure and notify the staff that you have done so. . Preventing infections: Shower with an antibacterial soap the morning of your procedure.  . Build-up your immune system: Take 1000 mg of Vitamin C with every meal (3 times a day) the day prior to your procedure. Marland Kitchen Antibiotics: Inform the staff if you have a condition or reason that requires you to take antibiotics before dental procedures. . Pregnancy: If you are pregnant, call and cancel the procedure. . Sickness: If you have a cold, fever, or any active infections, call and cancel the procedure. . Arrival: You must be in the facility at least 30 minutes prior to your scheduled procedure. . Children: Do not bring any children with you. . Dress appropriately: Bring dark clothing that you would not mind if they get stained. . Valuables: Do not bring any jewelry or valuables.  Procedure appointments are reserved for interventional treatments only. Marland Kitchen No Prescription Refills. . No medication changes will be discussed during procedure  appointments. . No disability issues will be discussed.  Reasons to call and reschedule or cancel your procedure: (Following these recommendations will minimize the risk of a serious complication.) . Surgeries: Avoid having procedures within 2 weeks of any surgery. (Avoid for 2 weeks before or after any surgery). . Flu Shots: Avoid having procedures within 2 weeks of a flu shots or . (Avoid for 2 weeks before or after immunizations). . Barium: Avoid having a procedure within 7-10 days after having had a radiological study involving the use of radiological contrast. (Myelograms, Barium swallow or enema study). . Heart attacks: Avoid any elective procedures or surgeries for the initial 6 months after a "Myocardial Infarction" (Heart Attack). . Blood thinners: It is imperative that you stop these medications before procedures. Let us know if you if you take any blood thinner.  . Infection: Avoid procedures during or within two weeks of an infection (including chest colds or gastrointestinal problems). Symptoms associated with infections include: Localized redness, fever, chills, night sweats or profuse sweating, burning sensation when voiding, cough, congestion, stuffiness, runny nose, sore throat, diarrhea, nausea, vomiting, cold or Flu symptoms, recent or current infections. It is specially important if the infection is over the area that we intend to treat. Marland Kitchen Heart and lung problems: Symptoms that may suggest an active cardiopulmonary problem include: cough, chest pain, breathing difficulties or shortness of breath, dizziness, ankle swelling, uncontrolled high or unusually low blood pressure, and/or palpitations. If you are experiencing any of these symptoms, cancel your procedure and contact your primary care physician for an evaluation.  Remember:  Regular Business hours are:  Monday to Thursday 8:00 AM to 4:00 PM  Provider's Schedule: Milinda Pointer, MD:  Procedure days: Tuesday and Thursday  7:30 AM to 4:00 PM  Gillis Santa, MD:  Procedure days: Monday and Wednesday 7:30 AM to 4:00 PM ____________________________________________________________________________________________    Sacroiliac Joint Injection  A sacroiliac (SI) joint injection is a procedure to inject a numbing medicine (anesthetic block)-and sometimes a strong anti-inflammatory medicine (steroid)-into the SI joint. The SI joint is the joint between two bones of the pelvis called the sacrum and the ilium. The sacrum is the bone at the base of the spine. The ilium is the large bone that forms the hip. You may need this procedure if you have pain because of an inflamed or diseased SI joint. Various conditions can cause pain in the SI joint, including rheumatoid arthritis, gout, psoriatic arthritis, infection, or injury. SI joint pain is a common cause of low back pain. It may also cause pain in your buttock or leg. SI joint injection may be done to:  Find out if an anesthetic block relieves pain. This can confirm that the SI joint is the cause of pain (diagnostic use).  Treat a painful SI joint with steroids, anesthetic medicine, or both (therapeutic use). Tell a health care provider about:  Any allergies you have.  All medicines you are taking, including vitamins, herbs, eye drops, creams, and over-the-counter medicines.  Any problems you or family members have had with anesthetic medicines.  Any blood disorders you have.  Any surgeries you have had.  Any medical conditions you have.  Whether you are pregnant or may be pregnant. What are the risks? Generally, this is a safe procedure. However, problems may occur, including:  Infection.  Bleeding.  Nerve injury.  Temporary increase in pain.  Headache.  Failure of the procedure to relieve pain.  Bruising or soreness at the joint, in deep tissues, or at the injection site.  Allergic reactions to medicines or dyes.  Side effects from the steroid  medicine. These may include facial flushing, increased appetite, diarrhea, and increased blood sugar. What happens before the procedure?  You may have a physical exam.  You may have imaging tests, such as an X-ray, CT scan, or MRI.  Ask your health care provider about: ? Changing or stopping your regular medicines. This is especially important if you are taking diabetes medicines or blood thinners. ? Taking medicines such as aspirin and ibuprofen. These medicines can thin your blood. Do not take these medicines unless your health care provider tells you to take them. ? Taking over-the-counter medicines, vitamins, herbs, and supplements.  Plan to have someone take you home from the hospital or clinic. What happens during the procedure?  To lower your risk of infection: ? Your health care team will wash or sanitize their hands. ? Your skin will be washed with a germ-killing (antiseptic) solution.  You may be given one or more of the following: ? A medicine to help you relax (sedative). ? A medicine to numb the area (local anesthetic). Your health care provider will inject a local anesthetic into the skin above your SI joint.  You will be placed in the proper position on a procedure table to give the health care team the best access to your SI joint.  An X-ray machine that produces moving X-ray images (fluoroscopy) will be placed above the procedure table.  A long, thin needle will be inserted through your skin and down to your SI joint.  The position of the needle will be checked with fluoroscopy imaging.  An X-ray dye (contrast media) will be injected to make sure the needle enters the joint space. You may be asked if you feel any pain.  Long-acting anesthetic medicine will be injected. Long-acting steroid medicine may also be injected.  The needle will be removed, and a bandage will be placed over the injection site. The procedure may vary among health care providers and  hospitals. What happens after the procedure?  Your blood pressure, heart rate, breathing rate, and blood oxygen level will be monitored until the medicines you were given have worn off.  If dye was used, you will be told to drink plenty of water to wash (flush) the dye out of your body.  You may be asked if you have pain relief from the injection.  You will likely be able to go home shortly after the procedure.  Your health care provider will give you instructions for taking care of yourself after the procedure. These may include instructions for doing physical therapy exercises.  Do not drive for 24 hours if you were given a sedative during the procedure. Summary  A sacroiliac (SI) joint injection is an injection of a numbing medicine (anesthetic block)-and sometimes a strong anti-inflammatory medicine (steroid)-into the SI joint.  You will be awake during the procedure, but the injection area will be made numb.  If you were given a medicine to help you relax (sedative during the procedure, do not drive for at least 24 hours. This information is not intended to replace advice given to you by your health care provider. Make sure you discuss any questions you have with your health care provider. Document Released: 05/25/2017 Document Revised: 05/25/2017 Document Reviewed: 05/25/2017 Elsevier Interactive Patient Education  2019 [employer].

## 2018-10-12 ENCOUNTER — Ambulatory Visit: Payer: Medicare HMO | Admitting: Student in an Organized Health Care Education/Training Program

## 2018-10-15 IMAGING — MG MM DIGITAL DIAGNOSTIC UNILAT*L* W/ TOMO W/ CAD
6 series · 6 of 14 positions shown · non-contrast
Comparison: Previous exam(s).

CLINICAL DATA: Status post tomosynthesis guided biopsy of a left
breast asymmetry

EXAM:
3D DIAGNOSTIC LEFT MAMMOGRAM POST STEREOTACTIC BIOPSY

[L ML synth-2D]
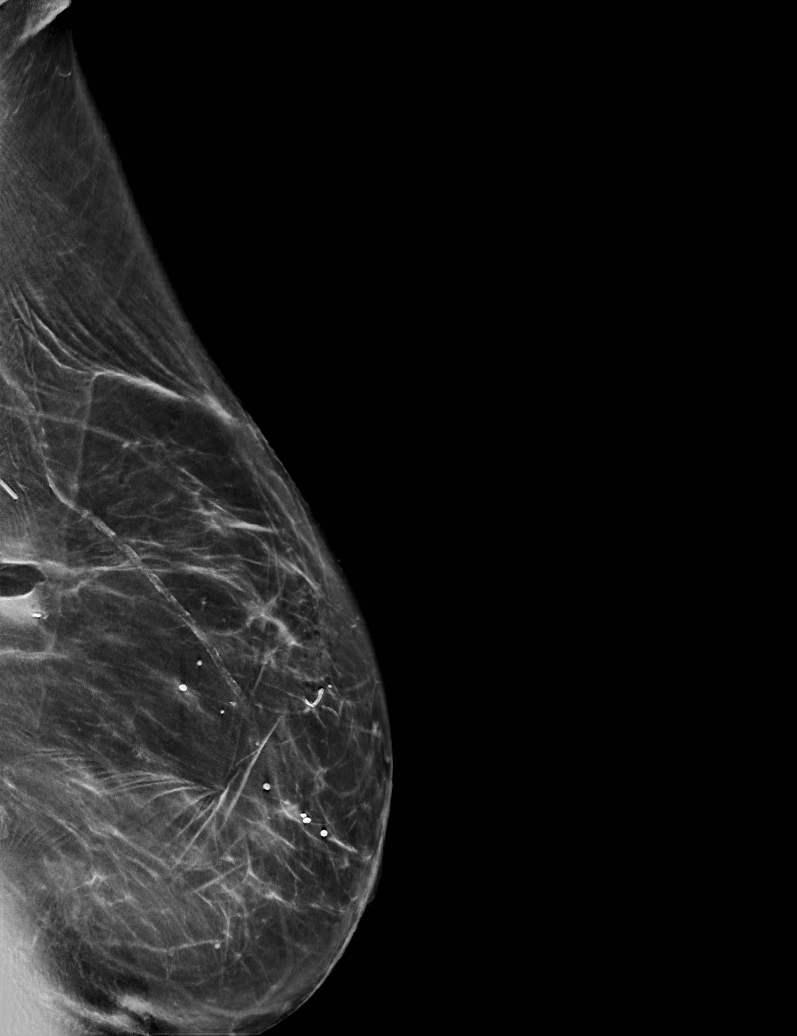

[L CC]
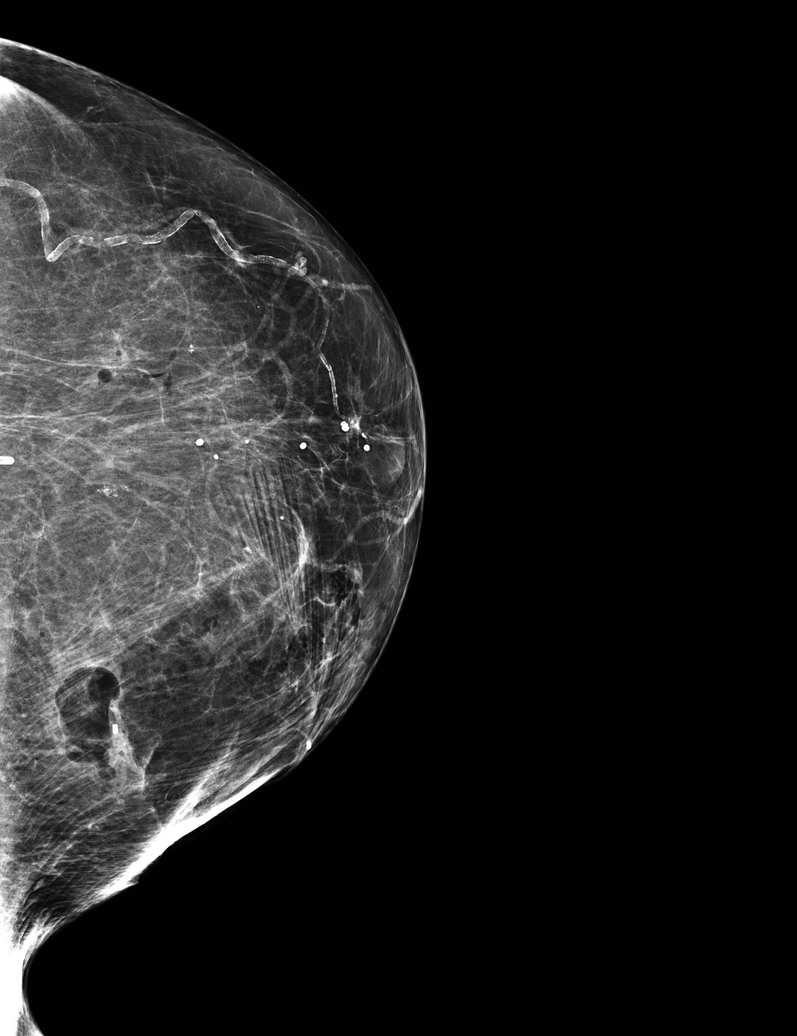

[L ML]
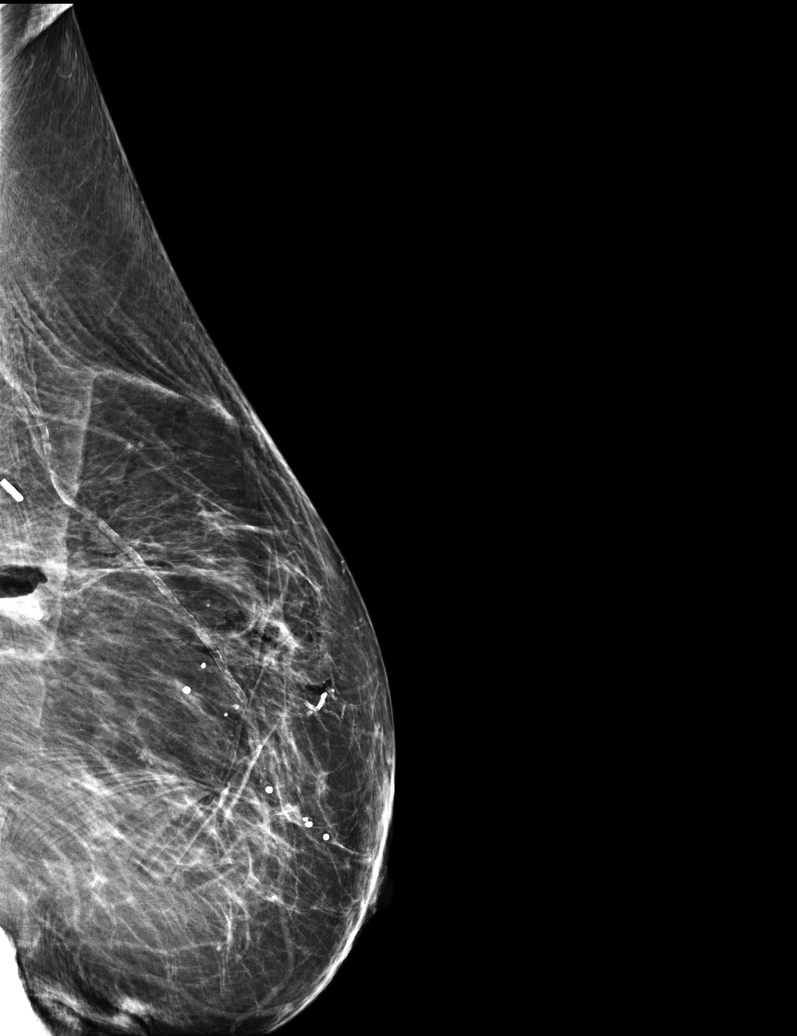

[L CC synth-2D]
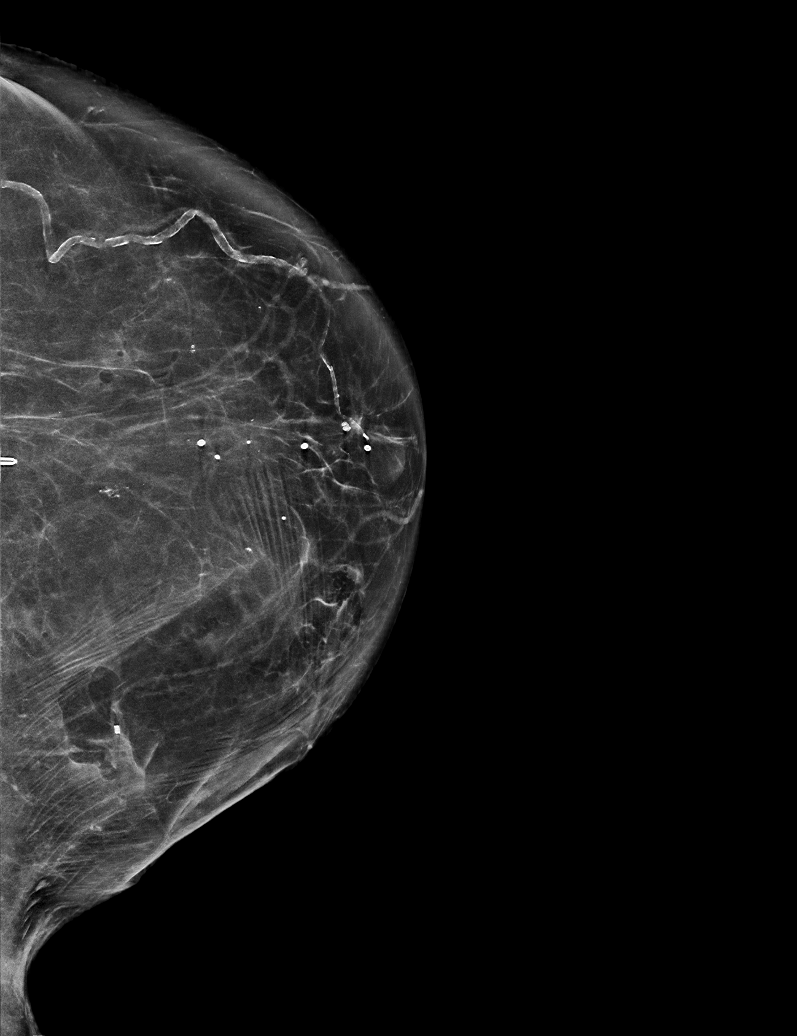

[L CC tomo · tomo slice 33/65.0]
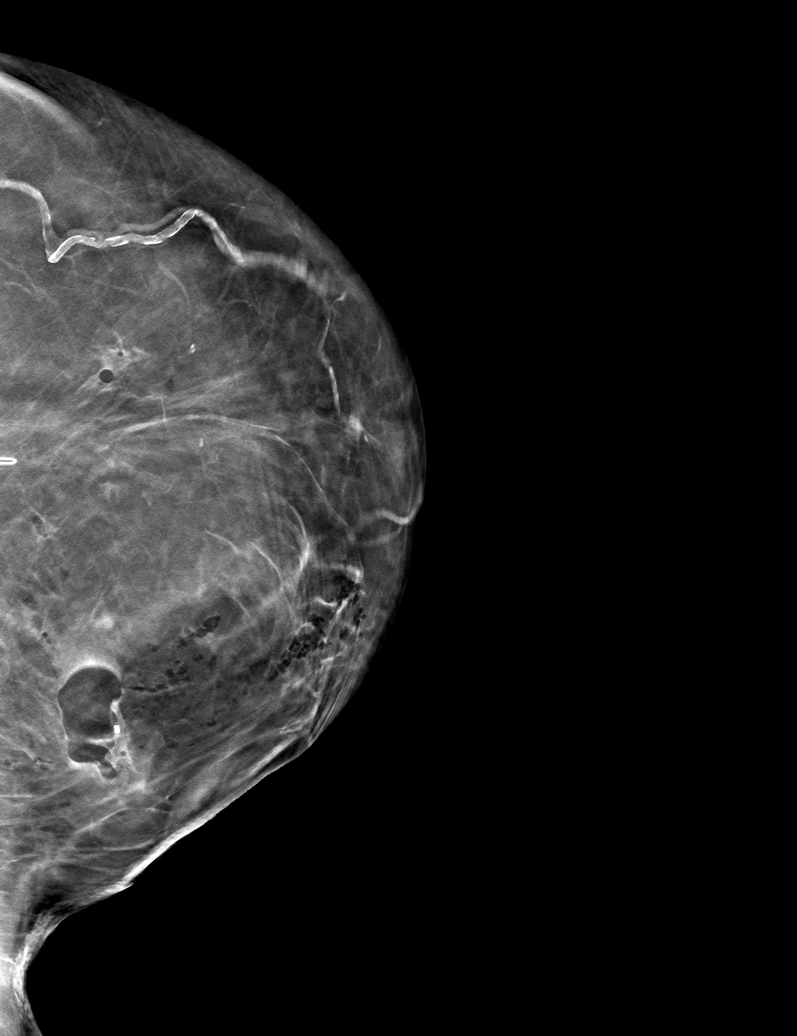

[L ML tomo · tomo slice 36/71.0]
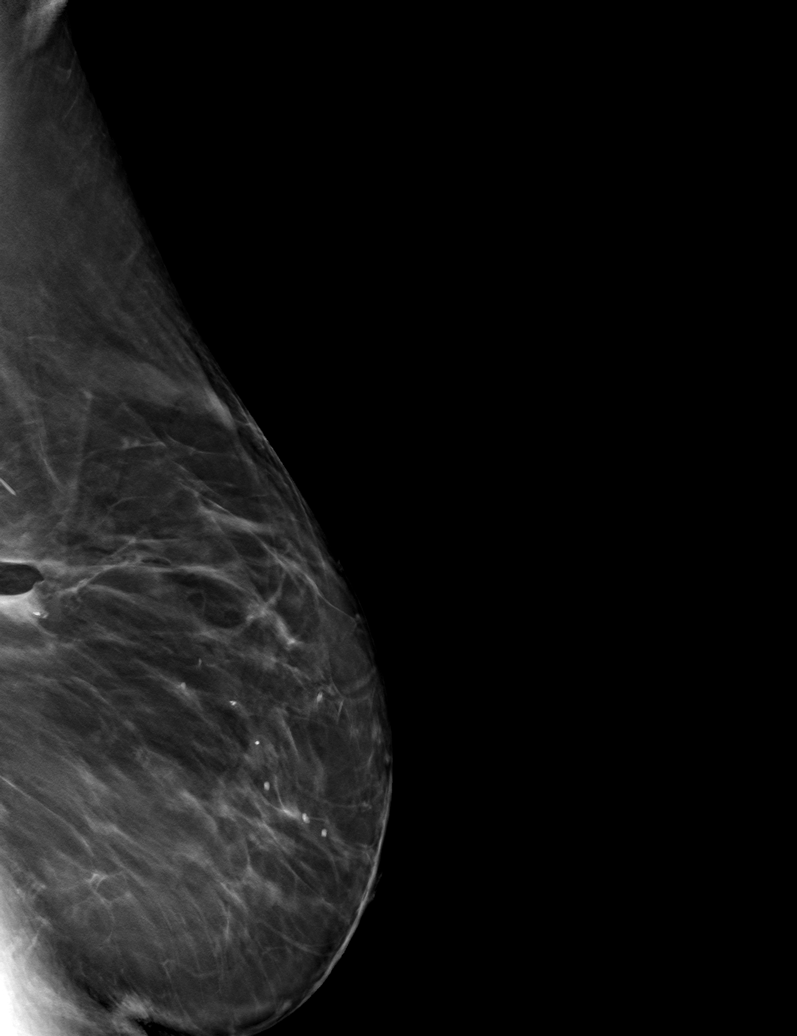

[6 of 14 positions shown; findings below may reference images not displayed]

FINDINGS: 3D Mammographic images were obtained following tomosynthesis guided
biopsy of a left breast asymmetry from a medial to lateral approach.
Post biopsy mammogram demonstrates the cylinder-shaped biopsy marker
to be approximately 1.9 cm medial to the expected location in the
upper, inner left breast. A portion of the asymmetry is still
visible on the post biopsy tomosynthesis images.
IMPRESSION: Medial migration of the biopsy marker as above. If biopsy results
demonstrate atypia or malignancy and excision is required, recommend
targeting of the residual asymmetry.

Final Assessment: Post Procedure Mammograms for Marker Placement

## 2018-10-20 ENCOUNTER — Other Ambulatory Visit: Payer: Self-pay

## 2018-10-20 ENCOUNTER — Encounter: Payer: Self-pay | Admitting: Student in an Organized Health Care Education/Training Program

## 2018-10-20 ENCOUNTER — Ambulatory Visit
Admission: RE | Admit: 2018-10-20 | Discharge: 2018-10-20 | Disposition: A | Discharge status: Home / Self Care | Payer: Medicare HMO | Source: Ambulatory Visit | Attending: Student in an Organized Health Care Education/Training Program | Admitting: Student in an Organized Health Care Education/Training Program

## 2018-10-20 ENCOUNTER — Ambulatory Visit (HOSPITAL_BASED_OUTPATIENT_CLINIC_OR_DEPARTMENT_OTHER): Payer: Medicare HMO | Admitting: Student in an Organized Health Care Education/Training Program

## 2018-10-20 DIAGNOSIS — G8929 Other chronic pain: Secondary | ICD-10-CM | POA: Insufficient documentation

## 2018-10-20 DIAGNOSIS — M533 Sacrococcygeal disorders, not elsewhere classified: Secondary | ICD-10-CM | POA: Diagnosis present

## 2018-10-20 MED ORDER — LACTATED RINGERS IV SOLN
1000.0000 mL | Freq: Once | INTRAVENOUS | Status: AC
  Administered 2018-10-20: 1000 mL via INTRAVENOUS

## 2018-10-20 MED ORDER — ROPIVACAINE HCL 2 MG/ML IJ SOLN
INTRAMUSCULAR | Status: AC
  Filled 2018-10-20: qty 10

## 2018-10-20 MED ORDER — FENTANYL CITRATE (PF) 100 MCG/2ML IJ SOLN
INTRAMUSCULAR | Status: AC
  Filled 2018-10-20: qty 2

## 2018-10-20 MED ORDER — DEXAMETHASONE SODIUM PHOSPHATE 10 MG/ML IJ SOLN
INTRAMUSCULAR | Status: AC
  Filled 2018-10-20: qty 2

## 2018-10-20 MED ORDER — ROPIVACAINE HCL 2 MG/ML IJ SOLN
10.0000 mL | Freq: Once | INTRAMUSCULAR | Status: AC
  Administered 2018-10-20: 10 mL

## 2018-10-20 MED ORDER — DEXAMETHASONE SODIUM PHOSPHATE 10 MG/ML IJ SOLN
10.0000 mg | Freq: Once | INTRAMUSCULAR | Status: AC
  Administered 2018-10-20: 10 mg

## 2018-10-20 MED ORDER — LIDOCAINE HCL 2 % IJ SOLN
20.0000 mL | Freq: Once | INTRAMUSCULAR | Status: AC
  Administered 2018-10-20: 400 mg

## 2018-10-20 MED ORDER — FENTANYL CITRATE (PF) 100 MCG/2ML IJ SOLN
25.0000 ug | INTRAMUSCULAR | Status: DC | PRN
  Administered 2018-10-20: 25 ug via INTRAVENOUS

## 2018-10-20 MED ORDER — LIDOCAINE HCL 2 % IJ SOLN
INTRAMUSCULAR | Status: AC
  Filled 2018-10-20: qty 20

## 2018-10-20 NOTE — Patient Instructions (Signed)

## 2018-10-20 NOTE — Progress Notes (Signed)
Patient's Name: Tamara Finley  MRN: [id number]  Referring Provider: Gillis Santa, MD  DOB: [birth year]  PCP: Ezequiel Kayser, MD  DOS: 10/20/2018  Note by: Gillis Santa, MD  Service setting: Ambulatory outpatient  Specialty: Interventional Pain Management  Patient type: Established  Location: [facility] (AMB) Pain Management Facility  Visit type: Interventional Procedure   Primary Reason for Visit: Interventional Pain Management Treatment. CC: Back Pain (lower)  Procedure:          Anesthesia, Analgesia, Anxiolysis:  Type: Diagnostic Sacroiliac Joint Steroid Injection #1  Region: Inferior Lumbosacral Region Level: PIIS (Posterior Inferior Iliac Spine) Laterality: Bilateral  Type: Moderate (Conscious) Sedation combined with Local Anesthesia Indication(s): Analgesia and Anxiety Route: Intravenous (IV) IV Access: Secured Sedation: Meaningful verbal contact was maintained at all times during the procedure  Local Anesthetic: Lidocaine 1-2%  Position: Prone           Indications: 1. Chronic SI joint pain    Pain Score: Pre-procedure: 7 /10 Post-procedure: 0-No pain/10  Pre-op Assessment:  [name] is a 78 y.o. (year old), female patient, seen today for interventional treatment. She  has a past surgical history that includes Cardiac surgery; Abdominal aortic aneurysm repair; Cardiac catheterization (N/A, 12/05/2015); Cardiac catheterization (Left, 12/31/2015); Breast excisional biopsy (Left, 2010); Breast excisional biopsy (Left, 07/01/2006); Breast biopsy (Left, 09/03/2016); Breast biopsy (Left, 09/25/2016); Bronchoscopy; CAROTID PTA/STENT INTERVENTION; Colonoscopy; Eye surgery; Laminectomy (07/25/2013); Back surgery; Mediastinoscopy; Mastectomy; Thoracoscopy (12/16/2016); and Colonoscopy with propofol (N/A, 06/15/2017). [name] has a current medication list which includes the following prescription(s): acetaminophen, albuterol, amlodipine, anastrozole, vitamin c, aspirin, atorvastatin, calcium  carbonate-vitamin d, carvedilol, cholecalciferol, fluticasone, furosemide, gabapentin, gavilyte-n with flavor pack, hydralazine, hydrochlorothiazide, hydrocodone-acetaminophen, levothyroxine, magnesium, metoprolol succinate, mirtazapine, omega-3 acid ethyl esters, oxycodone, pantoprazole, peg [id number]-nacl-nasulf, polyethylene glycol powder, potassium chloride sa, simvastatin, sodium chloride, vitamin b-12, vitamin d (ergocalciferol), gabapentin, and tramadol, and the following Facility-Administered Medications: fentanyl and lactated ringers. Her primarily concern today is the Back Pain (lower)  Initial Vital Signs:  Pulse/HCG Rate: 84ECG Heart Rate: 81 Temp: 97.9 F (36.6 C) Resp: 18 BP: (!) 178/79 SpO2: 100 %  BMI: Estimated body mass index is 30.32 kg/m as calculated from the following:   Height as of this encounter: 5' 4.25" (1.632 m).   Weight as of this encounter: 178 lb (80.7 kg).  Risk Assessment: Allergies: Reviewed. She is allergic to benadryl [diphenhydramine hcl (sleep)]; diphenhydramine hcl; and alprazolam.  Allergy Precautions: None required Coagulopathies: Reviewed. None identified.  Blood-thinner therapy: None at this time Active Infection(s): Reviewed. None identified. [name] is afebrile  Site Confirmation: [name] was asked to confirm the procedure and laterality before marking the site Procedure checklist: Completed Consent: Before the procedure and under the influence of no sedative(s), amnesic(s), or anxiolytics, the patient was informed of the treatment options, risks and possible complications. To fulfill our ethical and legal obligations, as recommended by the American Medical Association's Code of Ethics, I have informed the patient of my clinical impression; the nature and purpose of the treatment or procedure; the risks, benefits, and possible complications of the intervention; the alternatives, including doing nothing; the risk(s) and benefit(s) of the  alternative treatment(s) or procedure(s); and the risk(s) and benefit(s) of doing nothing. The patient was provided information about the general risks and possible complications associated with the procedure. These may include, but are not limited to: failure to achieve desired goals, infection, bleeding, organ or nerve damage, allergic reactions, paralysis, and death. In addition, the patient was informed of  those risks and complications associated to the procedure, such as failure to decrease pain; infection; bleeding; organ or nerve damage with subsequent damage to sensory, motor, and/or autonomic systems, resulting in permanent pain, numbness, and/or weakness of one or several areas of the body; allergic reactions; (i.e.: anaphylactic reaction); and/or death. Furthermore, the patient was informed of those risks and complications associated with the medications. These include, but are not limited to: allergic reactions (i.e.: anaphylactic or anaphylactoid reaction(s)); adrenal axis suppression; blood sugar elevation that in diabetics may result in ketoacidosis or comma; water retention that in patients with history of congestive heart failure may result in shortness of breath, pulmonary edema, and decompensation with resultant heart failure; weight gain; swelling or edema; medication-induced neural toxicity; particulate matter embolism and blood vessel occlusion with resultant organ, and/or nervous system infarction; and/or aseptic necrosis of one or more joints. Finally, the patient was informed that Medicine is not an exact science; therefore, there is also the possibility of unforeseen or unpredictable risks and/or possible complications that may result in a catastrophic outcome. The patient indicated having understood very clearly. We have given the patient no guarantees and we have made no promises. Enough time was given to the patient to ask questions, all of which were answered to the patient's  satisfaction. [name] has indicated that she wanted to continue with the procedure. Attestation: I, the ordering provider, attest that I have discussed with the patient the benefits, risks, side-effects, alternatives, likelihood of achieving goals, and potential problems during recovery for the procedure that I have provided informed consent. Date  Time: 10/20/2018  8:40 AM  Pre-Procedure Preparation:  Monitoring: As per clinic protocol. Respiration, ETCO2, SpO2, BP, heart rate and rhythm monitor placed and checked for adequate function Safety Precautions: Patient was assessed for positional comfort and pressure points before starting the procedure. Time-out: I initiated and conducted the "Time-out" before starting the procedure, as per protocol. The patient was asked to participate by confirming the accuracy of the "Time Out" information. Verification of the correct person, site, and procedure were performed and confirmed by me, the nursing staff, and the patient. "Time-out" conducted as per Joint Commission's Universal Protocol (UP.01.01.01). Time: 0929  Description of Procedure:          Target Area: Inferior, posterior, aspect of the sacroiliac fissure Approach: Posterior, paraspinal, ipsilateral approach. Area Prepped: Entire Lower Lumbosacral Region Prepping solution: ChloraPrep (2% chlorhexidine gluconate and 70% isopropyl alcohol) Safety Precautions: Aspiration looking for blood return was conducted prior to all injections. At no point did we inject any substances, as a needle was being advanced. No attempts were made at seeking any paresthesias. Safe injection practices and needle disposal techniques used. Medications properly checked for expiration dates. SDV (single dose vial) medications used. Description of the Procedure: Protocol guidelines were followed. The patient was placed in position over the procedure table. The target area was identified and the area prepped in the usual  manner. Skin & deeper tissues infiltrated with local anesthetic. Appropriate amount of time allowed to pass for local anesthetics to take effect. The procedure needle was advanced under fluoroscopic guidance into the sacroiliac joint until a firm endpoint was obtained. Proper needle placement secured. Negative aspiration confirmed. Solution injected in intermittent fashion, asking for systemic symptoms every 0.5cc of injectate. The needles were then removed and the area cleansed, making sure to leave some of the prepping solution back to take advantage of its long term bactericidal properties. Vitals:   10/20/18 0940 10/20/18 1683  10/20/18 1000 10/20/18 1010  BP: (!) 175/90 (!) 167/80 (!) 165/82 133/74  Pulse:      Resp: 16 18 17 19   Temp:    97.6 F (36.4 C)  TempSrc:    Temporal  SpO2: 98% 99% 100% 99%  Weight:      Height:        Start Time: 0929 hrs. End Time: 0940 hrs. Materials:  Needle(s) Type: Spinal Needle Gauge: 25G Length: 3.5-in Medication(s): Please see orders for medications and dosing details. 5 cc solution made of 4 cc of 0.2% ropivacaine, 1 cc of Decadron 10 mg/cc.  2.5 cc injected intra-articular, 2.5 cc injected periarticular for the right side 5 cc solution made of 4 cc of 0.2% ropivacaine, 1 cc of Decadron 10 mg/cc.  2.5 cc injected intra-articular, 2.5 cc injected periarticular for the left side Imaging Guidance (Non-Spinal):          Type of Imaging Technique: Fluoroscopy Guidance (Non-Spinal) Indication(s): Assistance in needle guidance and placement for procedures requiring needle placement in or near specific anatomical locations not easily accessible without such assistance. Exposure Time: Please see nurses notes. Contrast: Before injecting any contrast, we confirmed that the patient did not have an allergy to iodine, shellfish, or radiological contrast. Once satisfactory needle placement was completed at the desired level, radiological contrast was injected.  Contrast injected under live fluoroscopy. No contrast complications. See chart for type and volume of contrast used. Fluoroscopic Guidance: I was personally present during the use of fluoroscopy. "Tunnel Vision Technique" used to obtain the best possible view of the target area. Parallax error corrected before commencing the procedure. "Direction-depth-direction" technique used to introduce the needle under continuous pulsed fluoroscopy. Once target was reached, antero-posterior, oblique, and lateral fluoroscopic projection used confirm needle placement in all planes. Images permanently stored in EMR. Interpretation: I personally interpreted the imaging intraoperatively. Adequate needle placement confirmed in multiple planes. Appropriate spread of contrast into desired area was observed. No evidence of afferent or efferent intravascular uptake. Permanent images saved into the patient's record.  Antibiotic Prophylaxis:   Anti-infectives (From admission, onward)   None     Indication(s): None identified  Post-operative Assessment:  Post-procedure Vital Signs:  Pulse/HCG Rate: 8477 Temp: 97.6 F (36.4 C) Resp: 19 BP: 133/74 SpO2: 99 %  EBL: None  Complications: No immediate post-treatment complications observed by team, or reported by patient.  Note: The patient tolerated the entire procedure well. A repeat set of vitals were taken after the procedure and the patient was kept under observation following institutional policy, for this type of procedure. Post-procedural neurological assessment was performed, showing return to baseline, prior to discharge. The patient was provided with post-procedure discharge instructions, including a section on how to identify potential problems. Should any problems arise concerning this procedure, the patient was given instructions to immediately contact us, at any time, without hesitation. In any case, we plan to contact the patient by telephone for a  follow-up status report regarding this interventional procedure.  Comments:  No additional relevant information.  Plan of Care    Imaging Orders     DG C-Arm 1-60 Min-No Report Procedure Orders    No procedure(s) ordered today    Medications ordered for procedure: Meds ordered this encounter  Medications  . lactated ringers infusion 1,000 mL  . fentaNYL (SUBLIMAZE) injection 25-100 mcg    Make sure Narcan is available in the pyxis when using this medication. In the event of respiratory depression (RR< 8/min): Titrate NARCAN (  naloxone) in increments of 0.1 to 0.2 mg IV at 2-3 minute intervals, until desired degree of reversal.  . lidocaine (XYLOCAINE) 2 % (with pres) injection 400 mg  . ropivacaine (PF) 2 mg/mL (0.2%) (NAROPIN) injection 10 mL  . dexamethasone (DECADRON) injection 10 mg  . dexamethasone (DECADRON) injection 10 mg   Medications administered: We administered lactated ringers, fentaNYL, lidocaine, ropivacaine (PF) 2 mg/mL (0.2%), dexamethasone, and dexamethasone.  See the medical record for exact dosing, route, and time of administration.  Disposition: Discharge home  Discharge Date & Time: 10/20/2018; 1013 hrs.   Physician-requested Follow-up: Return in about 4 weeks (around 11/17/2018) for Post Procedure Evaluation.  Future Appointments  Date Time Provider [place]  11/18/2018 11:45 AM Gillis Santa, MD [facility] None  04/15/2019  8:30 AM [facility] None  04/15/2019  9:30 AM [facility] None  04/15/2019 10:00 AM Dew, Erskine Squibb, MD [facility] None   Primary Care Physician: Ezequiel Kayser, MD Location: [facility] Note by: Gillis Santa, MD Date: 10/20/2018; Time: 10:25 AM  Disclaimer:  Medicine is not an exact science. The only guarantee in medicine is that nothing is guaranteed. It is important to note that the decision to proceed with this intervention was based on the information collected from the patient.  The Data and conclusions were drawn from the patient's questionnaire, the interview, and the physical examination. Because the information was provided in large part by the patient, it cannot be guaranteed that it has not been purposely or unconsciously manipulated. Every effort has been made to obtain as much relevant data as possible for this evaluation. It is important to note that the conclusions that lead to this procedure are derived in large part from the available data. Always take into account that the treatment will also be dependent on availability of resources and existing treatment guidelines, considered by other Pain Management Practitioners as being common knowledge and practice, at the time of the intervention. For Medico-Legal purposes, it is also important to point out that variation in procedural techniques and pharmacological choices are the acceptable norm. The indications, contraindications, technique, and results of the above procedure should only be interpreted and judged by a Board-Certified Interventional Pain Specialist with extensive familiarity and expertise in the same exact procedure and technique.

## 2018-10-20 NOTE — Progress Notes (Signed)
Safety precautions to be maintained throughout the outpatient stay will include: orient to surroundings, keep bed in low position, maintain call bell within reach at all times, provide assistance with transfer out of bed and ambulation.  

## 2018-10-21 ENCOUNTER — Telehealth: Payer: Self-pay | Admitting: *Deleted

## 2018-10-21 NOTE — Telephone Encounter (Signed)
No answer. LVM ?

## 2018-11-18 ENCOUNTER — Ambulatory Visit: Payer: Medicare HMO | Admitting: Student in an Organized Health Care Education/Training Program

## 2018-11-22 ENCOUNTER — Telehealth: Payer: Self-pay | Admitting: Student in an Organized Health Care Education/Training Program

## 2018-11-22 NOTE — Telephone Encounter (Signed)
Attempted to reach patient to do phone f/up for bilateral Sacro iliac joint injection.  Voicemail left that we could manage this over the phone an that if I don't hear back from her, I would try her back today or tomorrow.

## 2018-11-22 NOTE — Telephone Encounter (Signed)
Pt was scheduled for a f/u post procedure appt tomorrow and she is scared to come out bc of the virus she states she is doing well but wants to do a nurse visit over the phone tomorrow.

## 2018-11-23 ENCOUNTER — Ambulatory Visit: Payer: Medicare HMO | Admitting: Student in an Organized Health Care Education/Training Program

## 2018-12-27 ENCOUNTER — Encounter: Payer: Self-pay | Admitting: Student in an Organized Health Care Education/Training Program

## 2018-12-28 ENCOUNTER — Ambulatory Visit
Payer: Medicare HMO | Attending: Student in an Organized Health Care Education/Training Program | Admitting: Student in an Organized Health Care Education/Training Program

## 2018-12-28 ENCOUNTER — Other Ambulatory Visit: Payer: Self-pay

## 2018-12-28 VITALS — Ht 64.0 in | Wt 178.0 lb

## 2018-12-28 DIAGNOSIS — M5416 Radiculopathy, lumbar region: Secondary | ICD-10-CM

## 2018-12-28 DIAGNOSIS — M5442 Lumbago with sciatica, left side: Secondary | ICD-10-CM

## 2018-12-28 DIAGNOSIS — G894 Chronic pain syndrome: Secondary | ICD-10-CM

## 2018-12-28 DIAGNOSIS — Z79899 Other long term (current) drug therapy: Secondary | ICD-10-CM

## 2018-12-28 DIAGNOSIS — M47816 Spondylosis without myelopathy or radiculopathy, lumbar region: Secondary | ICD-10-CM

## 2018-12-28 DIAGNOSIS — M533 Sacrococcygeal disorders, not elsewhere classified: Secondary | ICD-10-CM | POA: Diagnosis not present

## 2018-12-28 DIAGNOSIS — M25552 Pain in left hip: Secondary | ICD-10-CM

## 2018-12-28 DIAGNOSIS — G8929 Other chronic pain: Secondary | ICD-10-CM

## 2018-12-28 DIAGNOSIS — M5136 Other intervertebral disc degeneration, lumbar region: Secondary | ICD-10-CM

## 2018-12-28 DIAGNOSIS — M5441 Lumbago with sciatica, right side: Secondary | ICD-10-CM

## 2018-12-28 DIAGNOSIS — M25551 Pain in right hip: Secondary | ICD-10-CM

## 2018-12-28 MED ORDER — HYDROCODONE-ACETAMINOPHEN 5-325 MG PO TABS: 1.0000 | ORAL_TABLET | Freq: Two times a day (BID) | ORAL | 0 refills | Status: AC | PRN

## 2018-12-28 MED ORDER — HYDROCODONE-ACETAMINOPHEN 5-325 MG PO TABS: 1.0000 | ORAL_TABLET | Freq: Two times a day (BID) | ORAL | 0 refills | Status: DC | PRN

## 2018-12-28 NOTE — Progress Notes (Signed)
Pain Management Virtual Encounter Note - Virtual Visit via [place]att (real-time audio visits between healthcare provider and patient).  Patient's Phone No. & Preferred Pharmacy:  [phone] (home); [phone] (mobile); (Preferred) [phone] [email or web address]  [id number] Tamara Finley, [place] [address] Phone: [phone] Fax: [phone]   Pre-screening note:  Our staff contacted Tamara Finley and offered her an "in person", "face-to-face" appointment versus a telephone encounter. She indicated preferring the telephone encounter, at this time.  Reason for Virtual Visit: COVID-19*  Social distancing based on CDC and AMA recommendations.   I contacted Tamara Finley on 12/28/2018 at 2:09 PM via video conference and clearly identified myself as Tamara Santa, MD. I verified that I was speaking with the correct person using two identifiers (Name and date of birth: 09/02/1940).  Advanced Informed Consent I sought verbal advanced consent from Tamara Finley for virtual visit interactions. I informed Tamara Finley of possible security and privacy concerns, risks, and limitations associated with providing "not-in-person" medical evaluation and management services. I also informed Tamara Finley of the availability of "in-person" appointments. Finally, I informed her that there would be a charge for the virtual visit and that she could be  personally, fully or partially, financially responsible for it. Tamara Finley expressed understanding and agreed to proceed.   Historic Elements   Tamara Finley is a 78 y.o. year old, female patient evaluated today after her last encounter by our practice on 11/22/2018. Tamara Finley  has a past medical history of Abdominal aortic aneurysm (AAA) (Tamara Finley) (2000's), Age related entropion, unspecified laterality, Anxiety, Anxiety and depression, Bilateral renal artery stenosis (Tamara Finley), Breast calcification, left, Cancer (Tamara Finley)  (10/20/2016), Carotid artery thrombosis, bilateral, Carotid stenosis, Chronic bronchitis (Tamara Finley), Chronic insomnia, COPD (chronic obstructive pulmonary disease) (Tamara Finley), Coronary artery disease, CRI (chronic renal insufficiency), stage 3 (moderate) (Tamara Finley), Depression, Diabetes mellitus without complication (Tamara Finley), GERD (gastroesophageal reflux disease), Heart palpitations, History of AAA (abdominal aortic aneurysm) repair, History of colon polyps, History of TIA (transient ischemic attack), Hypercholesterolemia, Hypertension, Hypothyroidism, Migraine headache, Osteoarthritis, Osteoporosis, Peripheral arterial disease (Tamara Finley), Pulmonary scarring, Renal disorder, SOB (shortness of breath), Stenosis of left subclavian artery (Tamara Finley), Stroke (Tamara Finley), Thyroid disease, TIA (transient ischemic attack), and Type 2 diabetes mellitus with vascular disease (Tamara Finley). She also  has a past surgical history that includes Cardiac surgery; Abdominal aortic aneurysm repair; Cardiac catheterization (N/A, 12/05/2015); Cardiac catheterization (Left, 12/31/2015); Breast excisional biopsy (Left, 2010); Breast excisional biopsy (Left, 07/01/2006); Breast biopsy (Left, 09/03/2016); Breast biopsy (Left, 09/25/2016); Bronchoscopy; CAROTID PTA/STENT INTERVENTION; Colonoscopy; Eye surgery; Laminectomy (07/25/2013); Back surgery; Mediastinoscopy; Mastectomy; Thoracoscopy (12/16/2016); and Colonoscopy with propofol (N/A, 06/15/2017). Tamara Finley has a current medication list which includes the following prescription(s): acetaminophen, albuterol, amlodipine, anastrozole, vitamin c, aspirin, atorvastatin, calcium carbonate-vitamin d, carvedilol, cholecalciferol, fluticasone, furosemide, gabapentin, hydralazine, hydrochlorothiazide, hydrocodone-acetaminophen, levothyroxine, metoprolol succinate, mirtazapine, pantoprazole, polyethylene glycol powder, potassium chloride sa, simvastatin, sodium chloride, vitamin b-12, vitamin d (ergocalciferol), gabapentin, Tamara  with flavor Finley, hydrocodone-acetaminophen, magnesium, omega-3 acid ethyl esters, oxycodone, peg 3350-kcl-nabcb-nacl-nasulf, and tramadol. She  reports that she quit smoking about 5 years ago. Her smoking use included cigarettes. She has a 100.00 Finley-year smoking history. She has never used smokeless tobacco. She reports that she does not drink alcohol or use drugs. Tamara Finley is allergic to benadryl [diphenhydramine hcl (sleep)]; diphenhydramine hcl; and alprazolam.   HPI  I last communicated with her on 11/22/2018. Today, she is being contacted for both, medication management and a  post-procedure assessment.  Post-Procedure Evaluation  Procedure: Bilateral sacroiliac joint injection #1 Pre-procedure pain level:  7/10 Post-procedure: 0/10          Initial Analgesic Effects (1st post-procedure hour): 100% Sedation: Please see nurses note. When none is used, analgesia during this period is strictly due to the local anesthetic. When sedation is administered, analgesia may be a combination of the IV analgesic/anxiolytic, plus the effect of the local anesthetics used.  Persistent Analgesic Response (subsequent 4-6 hours post-procedure):80 % Local anesthetic used: Long-acting (4-6 hours) Analgesic effects during this period is associated to the localized infiltration of local anesthetics and therefore caries significant diagnostic value as to the etiological location, or anatomical origin, of the pain. Expected duration of relief is directly dependent on the pharmacodynamics of the local anesthetic used.  Analgesic Response past initial 6 hours post-procedure: 50 % Long-term benefit: Defined as any relief past the pharmacologic duration of the local anesthetics.   Current benefits: Defined as benefit that persist at this time.   Analgesia: Back to baseline Function: Back to baseline ROM: Back to baseline  Pharmacotherapy Assessment   09/28/2018  2   09/28/2018  Hydrocodone-Acetamin 5-325 MG   30.00 30 Bi Lat   [id number]   Nor ([id number])   0  5.00 MME  Private Pay   Tamara Finley     Monitoring: Pharmacotherapy: No side-effects or adverse reactions reported. [place] PMP: PDMP reviewed during this encounter.          Compliance: No problems identified or detected. Plan: Refer to "POC".   Assessment  The primary encounter diagnosis was Chronic SI joint pain. Diagnoses of Lumbar facet arthropathy (L4/L5 and L5/S1), Lumbar spondylosis, Lumbar radiculopathy, Chronic pain syndrome, Controlled substance agreement signed, Lumbar degenerative disc disease, Chronic bilateral low back pain with bilateral sciatica, and Pain of both hip joints (R>L) were also pertinent to this visit.  Plan of Care  I have changed Tamara Finley. Boltz's HYDROcodone-acetaminophen. I am also having her start on HYDROcodone-acetaminophen. Additionally, I am having her maintain her levothyroxine, metoprolol succinate, pantoprazole, albuterol, simvastatin, traMADol, aspirin, Calcium Carbonate-Vitamin D, fluticasone, cholecalciferol, sodium chloride, omega-3 acid ethyl esters, magnesium, amLODipine, hydrochlorothiazide, hydrALAZINE, vitamin B-12, furosemide, gabapentin, atorvastatin, acetaminophen, anastrozole, oxyCODONE, Tamara Finley, polyethylene glycol powder, Vitamin C, potassium chloride SA, PEG 3350-KCl-NaBcb-NaCl-NaSulf (PEG-3350/ELECTROLYTES PO), mirtazapine, gabapentin, Vitamin D (Ergocalciferol), and carvedilol.  No significant benefit after bilateral sacroiliac joint injection.  Patient continues to endorse severe and debilitating low back and buttock pain as well as hip pain.  We have tried various interventional modalities for this patient including lumbar epidural steroid injections, lumbar facet medial branch nerve blocks as well as SI joint injection which have not provided the patient with long-term pain relief.  At this point, we will focus on optimizing medication management to improve her functional status and  quality of life as the patient struggles with managing her pain on her current regimen.  She does find benefit with hydrocodone 5 mg daily PRN.  Her last prescription was written by me in January.  We discussed increasing her dose to 5 mg twice daily as needed.  We had extensive conversation about the risk of chronic opioid therapy which could include cognitive side effects, GI side effects, endocrine side effects, respiratory side effects as well as the risk of dependence.  However patient's dose is fairly low and we have exhausted non-opioid and interventional modalities.  I also had extensive discussion with the patient about performing physical therapy exercises at home to  help with her lumbar paraspinal muscle range of motion.  Resources were provided to the patient so that she continued exercises at home.  Patient will follow-up in 2 months.  Fill dates below for 12/28/2018 and 01/27/2019.  Pharmacotherapy (Medications Ordered): Meds ordered this encounter  Medications  . HYDROcodone-acetaminophen (NORCO/VICODIN) 5-325 MG tablet    Sig: Take 1 tablet by mouth 2 (two) times daily as needed for up to 30 days for moderate pain.    Dispense:  60 tablet    Refill:  0    Do not place this medication, or any other prescription from our practice, on "Automatic Refill". Patient may have prescription filled one day early if pharmacy is closed on scheduled refill date.  Marland Kitchen HYDROcodone-acetaminophen (NORCO/VICODIN) 5-325 MG tablet    Sig: Take 1 tablet by mouth 2 (two) times daily as needed for up to 30 days for severe pain. Must last 30 days.    Dispense:  60 tablet    Refill:  0    [place] STOP ACT - Not applicable. Fill one day early if pharmacy is closed on scheduled refill date.   Orders:  No orders of the defined types were placed in this encounter.  Follow-up plan:   Return in about 8 weeks (around 02/22/2019) for Medication Management.   I discussed the assessment and treatment plan with the  patient. The patient was provided an opportunity to ask questions and all were answered. The patient agreed with the plan and demonstrated an understanding of the instructions.  Patient advised to call back or seek an in-person evaluation if the symptoms or condition worsens.  Total duration of non-face-to-face encounter: 52minutes.  Note by: Tamara Santa, MD Date: 12/28/2018; Time: 2:09 PM  Disclaimer:  * Given the special circumstances of the COVID-19 pandemic, the federal government has announced that the Office for Civil Rights (OCR) will exercise its enforcement discretion and will not impose penalties on physicians using telehealth in the event of noncompliance with regulatory requirements under the [place] and Accountability Act (HIPAA) in connection with the good faith provision of telehealth during the QJJHE-17 national public health emergency. ([place])

## 2019-02-17 ENCOUNTER — Telehealth: Payer: Self-pay

## 2019-02-17 ENCOUNTER — Encounter: Payer: Self-pay | Admitting: Student in an Organized Health Care Education/Training Program

## 2019-02-17 NOTE — Telephone Encounter (Signed)
LM for patient to call office for pre virtual appointment questions.  

## 2019-02-21 ENCOUNTER — Encounter: Payer: Self-pay | Admitting: Student in an Organized Health Care Education/Training Program

## 2019-02-21 ENCOUNTER — Ambulatory Visit
Payer: Medicare HMO | Attending: Student in an Organized Health Care Education/Training Program | Admitting: Student in an Organized Health Care Education/Training Program

## 2019-02-21 ENCOUNTER — Other Ambulatory Visit: Payer: Self-pay

## 2019-02-21 DIAGNOSIS — G8929 Other chronic pain: Secondary | ICD-10-CM

## 2019-02-21 DIAGNOSIS — Z79899 Other long term (current) drug therapy: Secondary | ICD-10-CM

## 2019-02-21 DIAGNOSIS — M5416 Radiculopathy, lumbar region: Secondary | ICD-10-CM

## 2019-02-21 DIAGNOSIS — M47816 Spondylosis without myelopathy or radiculopathy, lumbar region: Secondary | ICD-10-CM

## 2019-02-21 DIAGNOSIS — M5441 Lumbago with sciatica, right side: Secondary | ICD-10-CM

## 2019-02-21 DIAGNOSIS — G894 Chronic pain syndrome: Secondary | ICD-10-CM

## 2019-02-21 DIAGNOSIS — M533 Sacrococcygeal disorders, not elsewhere classified: Secondary | ICD-10-CM | POA: Diagnosis not present

## 2019-02-21 DIAGNOSIS — M5442 Lumbago with sciatica, left side: Secondary | ICD-10-CM

## 2019-02-21 DIAGNOSIS — M5136 Other intervertebral disc degeneration, lumbar region: Secondary | ICD-10-CM

## 2019-02-21 MED ORDER — HYDROCODONE-ACETAMINOPHEN 5-325 MG PO TABS: 1.0000 | ORAL_TABLET | Freq: Two times a day (BID) | ORAL | 0 refills | Status: AC | PRN

## 2019-02-21 MED ORDER — HYDROCODONE-ACETAMINOPHEN 5-325 MG PO TABS: 1.0000 | ORAL_TABLET | Freq: Two times a day (BID) | ORAL | 0 refills | Status: DC | PRN

## 2019-02-21 NOTE — Progress Notes (Signed)
Pain Management Virtual Encounter Note - Virtual Visit via Telephone Telehealth (real-time audio visits between healthcare provider and patient).   Patient's Phone No. & Preferred Pharmacy:  [phone] (home); [phone] (mobile); (Preferred) [phone] [email or web address]  [id number] Shari Prows, [place] [address] Phone: [phone] Fax: [phone]    Pre-screening note:  Our staff contacted Tamara Finley and offered her an "in person", "face-to-face" appointment versus a telephone encounter. She indicated preferring the telephone encounter, at this time.   Reason for Virtual Visit: COVID-19*  Social distancing based on CDC and AMA recommendations.   I contacted Tamara Finley on 02/21/2019 via telephone.      I clearly identified myself as Gillis Santa, MD. I verified that I was speaking with the correct person using two identifiers (Name: Tamara Finley, and date of birth: 29-Sep-1940).  Advanced Informed Consent I sought verbal advanced consent from Tamara Finley for virtual visit interactions. I informed Tamara Finley of possible security and privacy concerns, risks, and limitations associated with providing "not-in-person" medical evaluation and management services. I also informed Tamara Finley of the availability of "in-person" appointments. Finally, I informed her that there would be a charge for the virtual visit and that she could be  personally, fully or partially, financially responsible for it. Tamara Finley expressed understanding and agreed to proceed.   Historic Elements   Tamara Finley is a 78 y.o. year old, female patient evaluated today after her last encounter by our practice on 02/17/2019. Tamara Finley  has a past medical history of Abdominal aortic aneurysm (AAA) ([place]) (2000's), Age related entropion, unspecified laterality, Anxiety, Anxiety and depression, Bilateral renal artery stenosis ([place]), Breast calcification, left, Cancer  ([place]) (10/20/2016), Carotid artery thrombosis, bilateral, Carotid stenosis, Chronic bronchitis ([place]), Chronic insomnia, COPD (chronic obstructive pulmonary disease) ([place]), Coronary artery disease, CRI (chronic renal insufficiency), stage 3 (moderate) ([place]), Depression, Diabetes mellitus without complication ([place]), GERD (gastroesophageal reflux disease), Heart palpitations, History of AAA (abdominal aortic aneurysm) repair, History of colon polyps, History of TIA (transient ischemic attack), Hypercholesterolemia, Hypertension, Hypothyroidism, Migraine headache, Osteoarthritis, Osteoporosis, Peripheral arterial disease ([place]), Pulmonary scarring, Renal disorder, SOB (shortness of breath), Stenosis of left subclavian artery ([place]), Stroke ([place]), Thyroid disease, TIA (transient ischemic attack), and Type 2 diabetes mellitus with vascular disease ([place]). She also  has a past surgical history that includes Cardiac surgery; Abdominal aortic aneurysm repair; Cardiac catheterization (N/A, 12/05/2015); Cardiac catheterization (Left, 12/31/2015); Breast excisional biopsy (Left, 2010); Breast excisional biopsy (Left, 07/01/2006); Breast biopsy (Left, 09/03/2016); Breast biopsy (Left, 09/25/2016); Bronchoscopy; CAROTID PTA/STENT INTERVENTION; Colonoscopy; Eye surgery; Laminectomy (07/25/2013); Back surgery; Mediastinoscopy; Mastectomy; Thoracoscopy (12/16/2016); and Colonoscopy with propofol (N/A, 06/15/2017). Tamara Finley has a current medication list which includes the following prescription(s): acetaminophen, albuterol, amlodipine, anastrozole, vitamin c, aspirin, atorvastatin, calcium carbonate-vitamin d, carvedilol, cholecalciferol, fluticasone, furosemide, gabapentin, gabapentin, gavilyte-n with flavor pack, hydralazine, hydrochlorothiazide, hydrocodone-acetaminophen, hydrocodone-acetaminophen, levothyroxine, magnesium, metoprolol succinate, mirtazapine, omega-3 acid ethyl esters, oxycodone, pantoprazole, peg  3350-kcl-nabcb-nacl-nasulf, polyethylene glycol powder, potassium chloride sa, simvastatin, sodium chloride, tramadol, vitamin b-12, and vitamin d (ergocalciferol). She  reports that she quit smoking about 5 years ago. Her smoking use included cigarettes. She has a 100.00 pack-year smoking history. She has never used smokeless tobacco. She reports that she does not drink alcohol or use drugs. Tamara Finley is allergic to benadryl [diphenhydramine hcl (sleep)]; diphenhydramine hcl; and alprazolam.   HPI  Today, she is being contacted for medication management.  No change in  medical history since last visit.  Patient's pain is at baseline.  Patient continues multimodal pain regimen as prescribed.  States that it provides pain relief and improvement in functional status.  Pharmacotherapy Assessment   12/28/2018  1   12/28/2018  Hydrocodone-Acetamin 5-325 MG  60.00 30 Bi Lat   [id number]   Nor ([id number])   0  10.00 MME  Private Pay   [place]    Monitoring: Pharmacotherapy: No side-effects or adverse reactions reported. [place] PMP: PDMP reviewed during this encounter.       Compliance: No problems identified. Effectiveness: Clinically acceptable. Plan: Refer to "POC".  Pertinent Labs   SAFETY SCREENING Profile Lab Results  Component Value Date   MRSAPCR NEGATIVE 12/31/2015   Renal Function Lab Results  Component Value Date   BUN 9 01/01/2016   CREATININE 1.03 (H) 01/01/2016   GFRAA >60 01/01/2016   GFRNONAA 53 (L) 01/01/2016   Hepatic Function Lab Results  Component Value Date   AST 28 10/17/2015   ALT 17 10/17/2015   ALBUMIN 3.7 10/17/2015   UDS No results found for: SUMMARY Note: Above Lab results reviewed.  Recent imaging  DG C-Arm 1-60 Min-No Report Fluoroscopy was utilized by the requesting physician.  No radiographic  interpretation.   Assessment  The primary encounter diagnosis was Chronic SI joint pain. Diagnoses of Lumbar facet arthropathy (L4/L5 and L5/S1), Lumbar spondylosis,  Lumbar radiculopathy, Chronic pain syndrome, Controlled substance agreement signed, Lumbar degenerative disc disease, and Chronic bilateral low back pain with bilateral sciatica were also pertinent to this visit.  Plan of Care  I am having Tamara Finley start on HYDROcodone-acetaminophen. I am also having her maintain her levothyroxine, metoprolol succinate, pantoprazole, albuterol, simvastatin, traMADol, aspirin, Calcium Carbonate-Vitamin D, fluticasone, cholecalciferol, sodium chloride, omega-3 acid ethyl esters, magnesium, amLODipine, hydrochlorothiazide, hydrALAZINE, vitamin B-12, furosemide, gabapentin, atorvastatin, acetaminophen, anastrozole, oxyCODONE, GaviLyte-N with Flavor Pack, polyethylene glycol powder, Vitamin C, potassium chloride SA, PEG 3350-KCl-NaBcb-NaCl-NaSulf (PEG-3350/ELECTROLYTES PO), mirtazapine, gabapentin, Vitamin D (Ergocalciferol), carvedilol, and HYDROcodone-acetaminophen.  Pharmacotherapy (Medications Ordered): Meds ordered this encounter  Medications  . HYDROcodone-acetaminophen (NORCO/VICODIN) 5-325 MG tablet    Sig: Take 1 tablet by mouth 2 (two) times daily as needed for up to 30 days for severe pain. Must last 30 days.    Dispense:  60 tablet    Refill:  0    [place] STOP ACT - Not applicable. Fill one day early if pharmacy is closed on scheduled refill date.  Marland Kitchen HYDROcodone-acetaminophen (NORCO/VICODIN) 5-325 MG tablet    Sig: Take 1 tablet by mouth 2 (two) times daily as needed for up to 30 days for severe pain. Must last 30 days.    Dispense:  60 tablet    Refill:  0    [place] STOP ACT - Not applicable. Fill one day early if pharmacy is closed on scheduled refill date.   Orders:  No orders of the defined types were placed in this encounter.  Follow-up plan:   Return in about 8 weeks (around 04/18/2019) for Medication Management.    Recent Visits Date Type Provider Dept  12/28/18 Office Visit Gillis Santa, MD [facility]  Showing recent visits within  past 90 days and meeting all other requirements   Today's Visits Date Type Provider Dept  02/21/19 Office Visit Gillis Santa, MD [facility]  Showing today's visits and meeting all other requirements   Future Appointments No visits were found meeting these conditions.  Showing future appointments within next 90 days and meeting  all other requirements   I discussed the assessment and treatment plan with the patient. The patient was provided an opportunity to ask questions and all were answered. The patient agreed with the plan and demonstrated an understanding of the instructions.  Patient advised to call back or seek an in-person evaluation if the symptoms or condition worsens.  Total duration of non-face-to-face encounter: 25 minutes.  Note by: Gillis Santa, MD Date: 02/21/2019; Time: 3:11 PM  Note: This dictation was prepared with Dragon dictation. Any transcriptional errors that may result from this process are unintentional.  Disclaimer:  * Given the special circumstances of the COVID-19 pandemic, the federal government has announced that the Office for Civil Rights (OCR) will exercise its enforcement discretion and will not impose penalties on physicians using telehealth in the event of noncompliance with regulatory requirements under the [place] (HIPAA) in connection with the good faith provision of telehealth during the FMMCR-75 national public health emergency. ([place])

## 2019-03-15 DIAGNOSIS — E538 Deficiency of other specified B group vitamins: Secondary | ICD-10-CM | POA: Insufficient documentation

## 2019-04-15 ENCOUNTER — Encounter (INDEPENDENT_AMBULATORY_CARE_PROVIDER_SITE_OTHER): Payer: Self-pay | Admitting: Vascular Surgery

## 2019-04-15 ENCOUNTER — Ambulatory Visit (INDEPENDENT_AMBULATORY_CARE_PROVIDER_SITE_OTHER): Payer: Medicare HMO

## 2019-04-15 ENCOUNTER — Ambulatory Visit (INDEPENDENT_AMBULATORY_CARE_PROVIDER_SITE_OTHER): Payer: Medicare HMO | Admitting: Vascular Surgery

## 2019-04-15 ENCOUNTER — Other Ambulatory Visit: Payer: Self-pay

## 2019-04-15 VITALS — BP 180/90 | HR 68 | Resp 16 | Ht 64.0 in | Wt 185.0 lb

## 2019-04-15 DIAGNOSIS — H02039 Senile entropion of unspecified eye, unspecified eyelid: Secondary | ICD-10-CM | POA: Insufficient documentation

## 2019-04-15 DIAGNOSIS — I6522 Occlusion and stenosis of left carotid artery: Secondary | ICD-10-CM

## 2019-04-15 DIAGNOSIS — E785 Hyperlipidemia, unspecified: Secondary | ICD-10-CM | POA: Diagnosis not present

## 2019-04-15 DIAGNOSIS — I701 Atherosclerosis of renal artery: Secondary | ICD-10-CM

## 2019-04-15 DIAGNOSIS — I6523 Occlusion and stenosis of bilateral carotid arteries: Secondary | ICD-10-CM | POA: Diagnosis not present

## 2019-04-15 DIAGNOSIS — E1159 Type 2 diabetes mellitus with other circulatory complications: Secondary | ICD-10-CM

## 2019-04-15 DIAGNOSIS — Z9889 Other specified postprocedural states: Secondary | ICD-10-CM

## 2019-04-15 DIAGNOSIS — I150 Renovascular hypertension: Secondary | ICD-10-CM | POA: Diagnosis not present

## 2019-04-15 NOTE — Assessment & Plan Note (Signed)
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  Stable and doing well with no changes clinically.  Plan on rechecking her in 1 year with renal artery duplex

## 2019-04-15 NOTE — Assessment & Plan Note (Signed)
Carotid duplex today shows 40 to 59% stenosis in the right carotid artery and velocities borderline into the 40 to 59% stenosis in the left carotid artery stent with slight progression from previous studies.  Continue current medical regimen.  No role for intervention at these mild to moderate levels.  Recheck in 1 year.

## 2019-04-15 NOTE — Progress Notes (Signed)
MRN : [id number]  Tamara Finley is a 79 y.o. ([birth year]) female who presents with chief complaint of  Chief Complaint  Patient presents with  . Follow-up    ultrasound follow up  .  History of Present Illness: Patient returns today in follow up of multiple vascular issues.  She reports she is been having a lot of back and leg pain from nerve issue since her last visit.  She denies any focal neurologic symptoms.  She says her blood pressure control has generally been pretty good.  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  Carotid duplex today shows 40 to 59% stenosis in the right carotid artery and velocities borderline into the 40 to 59% stenosis in the left carotid artery stent with slight progression from previous studies.  Current Outpatient Medications  Medication Sig Dispense Refill  . acetaminophen (TYLENOL) 500 MG tablet Take 1,000 mg by mouth every 8 (eight) hours as needed for moderate pain.     Marland Kitchen albuterol (PROVENTIL HFA;VENTOLIN HFA) 108 (90 BASE) MCG/ACT inhaler Inhale 2 puffs into the lungs every 6 (six) hours as needed for wheezing or shortness of breath. Reported on 12/31/2015    . amLODipine (NORVASC) 5 MG tablet Take 5 mg by mouth 3 (three) times daily.     Marland Kitchen anastrozole (ARIMIDEX) 1 MG tablet Take 1 mg by mouth daily.     . Ascorbic Acid (VITAMIN C) POWD Take 1 application by mouth daily.     Marland Kitchen aspirin 81 MG chewable tablet Chew 81 mg by mouth daily.    Marland Kitchen atorvastatin (LIPITOR) 40 MG tablet Take 40 mg by mouth daily at 6 PM.     . Calcium Carbonate-Vitamin D (CALCIUM 600+D) 600-400 MG-UNIT tablet Take 1 tablet by mouth 2 (two) times daily with a meal.    . carvedilol (COREG) 6.25 MG tablet Take 6.25 mg by mouth 2 (two) times daily with a meal.    . cholecalciferol (VITAMIN D) 1000 units tablet Take 2,000 Units by mouth daily. Reported on 12/31/2015    . fluticasone  (FLONASE) 50 MCG/ACT nasal spray Place 1-2 sprays into both nostrils daily as needed for rhinitis.    . furosemide (LASIX) 40 MG tablet Take 1 tablet by mouth 1 day or 1 dose.    . gabapentin (NEURONTIN) 300 MG capsule Take 1 capsule by mouth 1 day or 1 dose.    . gabapentin (NEURONTIN) 300 MG capsule Take 600 mg by mouth at bedtime.     Marland Kitchen GAVILYTE-N WITH FLAVOR PACK 420 g solution     . hydrALAZINE (APRESOLINE) 25 MG tablet Take 25 mg by mouth 3 (three) times daily.    . hydrochlorothiazide (HYDRODIURIL) 25 MG tablet Take 25 mg by mouth daily.    Marland Kitchen HYDROcodone-acetaminophen (NORCO/VICODIN) 5-325 MG tablet Take 1 tablet by mouth 2 (two) times daily as needed for up to 30 days for severe pain. Must last 30 days. (Patient not taking: Reported on 04/15/2019) 60 tablet 0  . levothyroxine (SYNTHROID, LEVOTHROID) 88 MCG tablet Take 88 mcg by mouth daily before breakfast.    . magnesium 30 MG tablet Take 250 mg by mouth daily.    . metoprolol succinate (TOPROL-XL) 50 MG 24 hr tablet Take 50 mg by mouth daily. Reported on 12/31/2015    . mirtazapine (REMERON) 15 MG tablet TAKE 1 TABLET BY MOUTH EVERY DAY AT NIGHT  3  .  omega-3 acid ethyl esters (LOVAZA) 1 g capsule Take 1 g by mouth daily.    Marland Kitchen oxyCODONE (OXY IR/ROXICODONE) 5 MG immediate release tablet Take by mouth.    . pantoprazole (PROTONIX) 40 MG tablet Take 40 mg by mouth daily.    Marland Kitchen PEG 3350-KCl-NaBcb-NaCl-NaSulf (PEG-3350/ELECTROLYTES PO) Take 4,000 mLs by mouth as directed.    . polyethylene glycol powder (GLYCOLAX/MIRALAX) powder Take 0.5 Containers by mouth as needed.     . potassium chloride SA (K-DUR,KLOR-CON) 20 MEQ tablet Take 20 mEq by mouth as directed.    . simvastatin (ZOCOR) 80 MG tablet Take 80 mg by mouth at bedtime. Reported on 12/31/2015    . sodium chloride (OCEAN) 0.65 % SOLN nasal spray Place 1-2 sprays into both nostrils as needed for congestion.    . traMADol (ULTRAM) 50 MG tablet Take 50 mg by mouth every 6 (six) hours as needed  for moderate pain.     . vitamin B-12 (CYANOCOBALAMIN) 1000 MCG tablet Take 1 tablet by mouth 1 day or 1 dose.    . Vitamin D, Ergocalciferol, (DRISDOL) 50000 units CAPS capsule Take 1 capsule by mouth once a week.     No current facility-administered medications for this visit.     Past Medical History:  Diagnosis Date  . Abdominal aortic aneurysm (AAA) ([place]) 2000's  . Age related entropion, unspecified laterality   . Anxiety   . Anxiety and depression   . Bilateral renal artery stenosis ([place])   . Breast calcification, left   . Cancer ([place]) 10/20/2016   breast cancer  . Carotid artery thrombosis, bilateral   . Carotid stenosis   . Chronic bronchitis ([place])   . Chronic insomnia   . COPD (chronic obstructive pulmonary disease) ([place])   . Coronary artery disease   . CRI (chronic renal insufficiency), stage 3 (moderate) (HCC)   . Depression   . Diabetes mellitus without complication ([place])   . GERD (gastroesophageal reflux disease)   . Heart palpitations   . History of AAA (abdominal aortic aneurysm) repair   . History of colon polyps   . History of TIA (transient ischemic attack)   . Hypercholesterolemia   . Hypertension   . Hypothyroidism   . Migraine headache   . Osteoarthritis   . Osteoporosis   . Peripheral arterial disease ([place])   . Pulmonary scarring   . Renal disorder   . SOB (shortness of breath)   . Stenosis of left subclavian artery (HCC)   . Stroke ([place])   . Thyroid disease   . TIA (transient ischemic attack)   . Type 2 diabetes mellitus with vascular disease [facility])     Past Surgical History:  Procedure Laterality Date  . ABDOMINAL AORTIC ANEURYSM REPAIR    . BACK SURGERY    . BREAST BIOPSY Left 09/03/2016   benign  . BREAST BIOPSY Left 09/25/2016   path pending  . BREAST EXCISIONAL BIOPSY Left 2010  . BREAST EXCISIONAL BIOPSY Left 07/01/2006  . BRONCHOSCOPY    . CARDIAC SURGERY    . CAROTID PTA/STENT INTERVENTION    . COLONOSCOPY    . COLONOSCOPY  WITH PROPOFOL N/A 06/15/2017   Procedure: COLONOSCOPY WITH PROPOFOL;  Surgeon: Lollie Sails, MD;  Location[facility] ENDOSCOPY;  Service: Endoscopy;  Laterality: N/A;  . EYE SURGERY    . LAMINECTOMY  07/25/2013  . MASTECTOMY    . MEDIASTINOSCOPY    . PERIPHERAL VASCULAR CATHETERIZATION N/A 12/05/2015   Procedure: Renal Angiography;  Surgeon:  Katha Cabal, MD;  Location: [place] CV LAB;  Service: Cardiovascular;  Laterality: N/A;  . PERIPHERAL VASCULAR CATHETERIZATION Left 12/31/2015   Procedure: Carotid PTA/Stent Intervention;  Surgeon: Algernon Huxley, MD;  Location: [place] CV LAB;  Service: Cardiovascular;  Laterality: Left;  . THORACOSCOPY  12/16/2016    Social History Social History   Tobacco Use  . Smoking status: Former Smoker    Packs/day: 2.00    Years: 50.00    Pack years: 100.00    Types: Cigarettes    Quit date: 06/01/2013    Years since quitting: 5.8  . Smokeless tobacco: Never Used  Substance Use Topics  . Alcohol use: No    Alcohol/week: 0.0 standard drinks  . Drug use: No    Family History Family History  Problem Relation Age of Onset  . CVA Mother   . Hypertension Mother   . Stroke Mother   . Epilepsy Mother   . Dementia Father   . Stroke Father   . Coronary artery disease Father   . Hypertension Father   . Ovarian cancer Sister   . Cancer Brother   . Diabetes Brother     Allergies  Allergen Reactions  . Benadryl [Diphenhydramine Hcl (Sleep)] Other (See Comments)    BAD DREAMS  . Diphenhydramine Hcl Other (See Comments)    Makes her "hyper"  . Alprazolam Rash    "bad dreams"   REVIEW OF SYSTEMS(Negative unless checked)  Constitutional: [] ?Weight loss[] ?Fever[] ?Chills Cardiac:[] ?Chest pain[] ?Chest pressure[] ?Palpitations [] ?Shortness of breath when laying flat [] ?Shortness of breath at rest [x] ?Shortness of breath with exertion. Vascular: [] ?Pain in legs with walking[] ?Pain in legsat rest[] ?Pain in legs  when laying flat [] ?Claudication [] ?Pain in feet when walking [] ?Pain in feet at rest [] ?Pain in feet when laying flat [] ?History of DVT [] ?Phlebitis [x] ?Swelling in legs [] ?Varicose veins [] ?Non-healing ulcers Pulmonary: [] ?Uses home oxygen [] ?Productive cough[] ?Hemoptysis [] ?Wheeze [x] ?COPD [] ?Asthma Neurologic: [] ?Dizziness [] ?Blackouts [] ?Seizures [] ?History of stroke [x] ?History of TIA[] ?Aphasia [] ?Temporary blindness[] ?Dysphagia [] ?Weaknessor numbness in arms [] ?Weakness or numbnessin legs Musculoskeletal: [x] ?Arthritis [] ?Joint swelling [] ?Joint pain [] ?Low back pain Hematologic:[] ?Easy bruising[] ?Easy bleeding [] ?Hypercoagulable state [] ?Anemic  Gastrointestinal:[] ?Blood in stool[] ?Vomiting blood[] ?Gastroesophageal reflux/heartburn[] ?Abdominal pain Genitourinary: [x] ?Chronic kidney disease [] ?Difficulturination [] ?Frequenturination [] ?Burning with urination[] ?Hematuria Skin: [] ?Rashes [] ?Ulcers [] ?Wounds Psychological: [] ?History of anxiety[] ?History of major depression.  Physical Examination  BP (!) 180/90 (BP Location: Right Arm)   Pulse 68   Resp 16   Ht 5\' 4"  (1.626 m)   Wt 185 lb (83.9 kg)   BMI 31.76 kg/m  Gen:  WD/WN, NAD Head: [place]/AT, No temporalis wasting. Ear/Nose/Throat: Hearing grossly intact, nares w/o erythema or drainage Eyes: Conjunctiva clear. Sclera non-icteric Neck: Supple.  Trachea midline.  No bruit Pulmonary:  Good air movement, no use of accessory muscles.  Cardiac: RRR, no JVD Vascular:  Vessel Right Left  Radial Palpable Palpable                                   Gastrointestinal: soft, non-tender/non-distended. No guarding/reflex.  Musculoskeletal: M/S 5/5 throughout.  No deformity or atrophy.  No significant lower extremity edema. Neurologic: Sensation grossly intact in extremities.  Symmetrical.  Speech is fluent.  Psychiatric: Judgment intact,  Mood & affect appropriate for pt's clinical situation. Dermatologic: No rashes or ulcers noted.  No cellulitis or open wounds.       Labs No results found for this or any previous visit (from the past 2160 hour(s)).  Radiology No results found.  Assessment/Plan Type 2 diabetes mellitus with vascular disease (HCC) blood glucose control important in reducing the progression of atherosclerotic disease. Also, involved in wound healing. On appropriate medications.   Hyperlipidemia, unspecified lipid control important in reducing the progression of atherosclerotic disease. Continue statin therapy  History of AAA (abdominal aortic aneurysm) repair Aortobiiliac graft appears patent by duplex  Renovascular hypertension, malignant Blood pressure control is good after left renal artery stent placement a few years ago  Renal artery stenosis (HCC) Noninvasive studies today show an atrophic right kidney which is unchanged and no hemodynamically significant stenosis in her left renal artery stent.  Stable and doing well with no changes clinically.  Plan on rechecking her in 1 year with renal artery duplex  Carotid stenosis Carotid duplex today shows 40 to 59% stenosis in the right carotid artery and velocities borderline into the 40 to 59% stenosis in the left carotid artery stent with slight progression from previous studies.  Continue current medical regimen.  No role for intervention at these mild to moderate levels.  Recheck in 1 year.    Leotis Pain, MD  04/15/2019 11:10 AM    This note was created with Dragon medical transcription system.  Any errors from dictation are purely unintentional

## 2019-04-18 ENCOUNTER — Encounter: Payer: Self-pay | Admitting: Student in an Organized Health Care Education/Training Program

## 2019-04-19 ENCOUNTER — Encounter: Payer: Self-pay | Admitting: Student in an Organized Health Care Education/Training Program

## 2019-04-19 ENCOUNTER — Ambulatory Visit
Payer: Medicare HMO | Attending: Student in an Organized Health Care Education/Training Program | Admitting: Student in an Organized Health Care Education/Training Program

## 2019-04-19 ENCOUNTER — Other Ambulatory Visit: Payer: Self-pay

## 2019-04-19 DIAGNOSIS — G8929 Other chronic pain: Secondary | ICD-10-CM

## 2019-04-19 DIAGNOSIS — M47816 Spondylosis without myelopathy or radiculopathy, lumbar region: Secondary | ICD-10-CM

## 2019-04-19 DIAGNOSIS — M533 Sacrococcygeal disorders, not elsewhere classified: Secondary | ICD-10-CM

## 2019-04-19 DIAGNOSIS — G894 Chronic pain syndrome: Secondary | ICD-10-CM | POA: Diagnosis not present

## 2019-04-19 DIAGNOSIS — M5416 Radiculopathy, lumbar region: Secondary | ICD-10-CM

## 2019-04-19 DIAGNOSIS — M5136 Other intervertebral disc degeneration, lumbar region: Secondary | ICD-10-CM

## 2019-04-19 DIAGNOSIS — M51369 Other intervertebral disc degeneration, lumbar region without mention of lumbar back pain or lower extremity pain: Secondary | ICD-10-CM

## 2019-04-19 MED ORDER — HYDROCODONE-ACETAMINOPHEN 5-325 MG PO TABS: 1.0000 | ORAL_TABLET | Freq: Two times a day (BID) | ORAL | 0 refills | Status: DC | PRN

## 2019-04-19 NOTE — Progress Notes (Signed)
Pain Management Virtual Encounter Note - Virtual Visit via [place] (real-time audio visits between healthcare provider and patient).   Patient's Phone No. & Preferred Pharmacy:  [phone] (home); [phone] (mobile); (Preferred) [phone] [email or web address]  [id number] [address], [place] [address] Phone: [phone] Fax: [phone]    Pre-screening note:  Our staff contacted Tamara Finley and offered her an "in person", "face-to-face" appointment versus a telephone encounter. She indicated preferring the telephone encounter, at this time.   Reason for Virtual Visit: COVID-19*  Social distancing based on CDC and AMA recommendations.   I contacted Tamara Finley on 04/19/2019 via video conference.      I clearly identified myself as Gillis Santa, MD. I verified that I was speaking with the correct person using two identifiers (Name: Tamara Finley, and date of birth: 01-18-1941).  Advanced Informed Consent I sought verbal advanced consent from Tamara Finley for virtual visit interactions. I informed Tamara Finley of possible security and privacy concerns, risks, and limitations associated with providing "not-in-person" medical evaluation and management services. I also informed Tamara Finley of the availability of "in-person" appointments. Finally, I informed her that there would be a charge for the virtual visit and that she could be  personally, fully or partially, financially responsible for it. Tamara Finley expressed understanding and agreed to proceed.   Historic Elements   Tamara Finley is a 78 y.o. year old, female patient evaluated today after her last encounter by our practice on 02/21/2019. Tamara Finley  has a past medical history of Abdominal aortic aneurysm (AAA) ([place]) (2000's), Age related entropion, unspecified laterality, Anxiety, Anxiety and depression, Bilateral renal artery stenosis ([place]), Breast calcification,  left, Cancer ([place]) (10/20/2016), Carotid artery thrombosis, bilateral, Carotid stenosis, Chronic bronchitis ([place]), Chronic insomnia, COPD (chronic obstructive pulmonary disease) ([place]), Coronary artery disease, CRI (chronic renal insufficiency), stage 3 (moderate) ([place]), Depression, Diabetes mellitus without complication ([place]), GERD (gastroesophageal reflux disease), Heart palpitations, History of AAA (abdominal aortic aneurysm) repair, History of colon polyps, History of TIA (transient ischemic attack), Hypercholesterolemia, Hypertension, Hypothyroidism, Migraine headache, Osteoarthritis, Osteoporosis, Peripheral arterial disease ([place]), Pulmonary scarring, Renal disorder, SOB (shortness of breath), Stenosis of left subclavian artery ([place]), Stroke ([place]), Thyroid disease, TIA (transient ischemic attack), and Type 2 diabetes mellitus with vascular disease ([place]). She also  has a past surgical history that includes Cardiac surgery; Abdominal aortic aneurysm repair; Cardiac catheterization (N/A, 12/05/2015); Cardiac catheterization (Left, 12/31/2015); Breast excisional biopsy (Left, 2010); Breast excisional biopsy (Left, 07/01/2006); Breast biopsy (Left, 09/03/2016); Breast biopsy (Left, 09/25/2016); Bronchoscopy; CAROTID PTA/STENT INTERVENTION; Colonoscopy; Eye surgery; Laminectomy (07/25/2013); Back surgery; Mediastinoscopy; Mastectomy; Thoracoscopy (12/16/2016); and Colonoscopy with propofol (N/A, 06/15/2017). Tamara Finley has a current medication list which includes the following prescription(s): acetaminophen, albuterol, amlodipine, anastrozole, aspirin, atorvastatin, calcium carbonate-vitamin d, carvedilol, fluticasone, furosemide, gabapentin, gavilyte-n with flavor pack, hydralazine, hydrochlorothiazide, hydrocodone-acetaminophen, levothyroxine, magnesium, metoprolol succinate, mirtazapine, omega-3 acid ethyl esters, oxycodone, pantoprazole, peg 3350-kcl-nabcb-nacl-nasulf, polyethylene glycol powder, potassium chloride  sa, simvastatin, sodium chloride, vitamin b-12, vitamin c, cholecalciferol, gabapentin, tramadol, and vitamin d (ergocalciferol). She  reports that she quit smoking about 5 years ago. Her smoking use included cigarettes. She has a 100.00 pack-year smoking history. She has never used smokeless tobacco. She reports that she does not drink alcohol or use drugs. Tamara Finley is allergic to benadryl [diphenhydramine hcl (sleep)]; diphenhydramine hcl; and alprazolam.   HPI  Today, she is being contacted for medication management.   No  change in medical history since last visit.  Patient's pain is at baseline.  Patient continues multimodal pain regimen as prescribed.  States that it provides pain relief and improvement in functional status.   Pharmacotherapy Assessment   02/21/2019  1   02/21/2019  Hydrocodone-Acetamin 5-325 MG  60.00 30 Bi Lat   [id number]   Nor ([id number])   0  10.00 MME  Private Pay   [place]    Monitoring: Pharmacotherapy: No side-effects or adverse reactions reported. [place] PMP: PDMP reviewed during this encounter.       Compliance: No problems identified. Effectiveness: Clinically acceptable. Plan: Refer to "POC".  Laboratory Chemistry Profile (12 mo)  Renal: No results found for requested labs within last 8760 hours.  Lab Results  Component Value Date   GFRAA >60 01/01/2016   GFRNONAA 53 (L) 01/01/2016   Hepatic: No results found for requested labs within last 8760 hours. Lab Results  Component Value Date   AST 28 10/17/2015   ALT 17 10/17/2015   Other: No results found for requested labs within last 8760 hours. Note: Above Lab results reviewed.   Assessment  The primary encounter diagnosis was Chronic SI joint pain. Diagnoses of Lumbar facet arthropathy (L4/L5 and L5/S1), Lumbar spondylosis, Lumbar radiculopathy, Chronic pain syndrome, and Lumbar degenerative disc disease were also pertinent to this visit.  Plan of Care  I have changed [name]. [place]'s  HYDROcodone-acetaminophen. I am also having her maintain her levothyroxine, metoprolol succinate, pantoprazole, albuterol, simvastatin, traMADol, aspirin, Calcium Carbonate-Vitamin D, fluticasone, cholecalciferol, sodium chloride, omega-3 acid ethyl esters, magnesium, amLODipine, hydrochlorothiazide, hydrALAZINE, vitamin B-12, furosemide, gabapentin, atorvastatin, acetaminophen, anastrozole, oxyCODONE, GaviLyte-N with Flavor Pack, polyethylene glycol powder, Vitamin C, potassium chloride SA, PEG 3350-KCl-NaBcb-NaCl-NaSulf (PEG-3350/ELECTROLYTES PO), mirtazapine, gabapentin, Vitamin D (Ergocalciferol), and carvedilol.  Pharmacotherapy (Medications Ordered): Meds ordered this encounter  Medications  . HYDROcodone-acetaminophen (NORCO/VICODIN) 5-325 MG tablet    Sig: Take 1 tablet by mouth 2 (two) times daily as needed for severe pain. Must last 30 days.    Dispense:  60 tablet    Refill:  0    [place] STOP ACT - Not applicable. Fill one day early if pharmacy is closed on scheduled refill date.   Orders Placed This Encounter  Procedures  . Compliance Drug Analysis, Ur    Volume: 30 ml(s). Minimum 3 ml of urine is needed. Document temperature of fresh sample. Indications: Long term (current) use of opiate analgesic (Z79.891) Test#: [phone] (Comprehensive Profile)     Follow-up plan:   Return in about 8 weeks (around 06/14/2019) for Medication Management, virtual.    Recent Visits Date Type Provider Dept  02/21/19 Office Visit Gillis Santa, MD [facility]  Showing recent visits within past 90 days and meeting all other requirements   Today's Visits Date Type Provider Dept  04/19/19 Office Visit Gillis Santa, MD [facility]  Showing today's visits and meeting all other requirements   Future Appointments No visits were found meeting these conditions.  Showing future appointments within next 90 days and meeting all other requirements   I discussed the assessment and  treatment plan with the patient. The patient was provided an opportunity to ask questions and all were answered. The patient agreed with the plan and demonstrated an understanding of the instructions.  Patient advised to call back or seek an in-person evaluation if the symptoms or condition worsens.  Total duration of non-face-to-face encounter: 25 minutes.  Note by: Gillis Santa, MD Date: 04/19/2019; Time: 11:08 AM  Note: This dictation was prepared with Dragon dictation. Any transcriptional errors that may result from this process are unintentional.  Disclaimer:  * Given the special circumstances of the COVID-19 pandemic, the federal government has announced that the Office for Civil Rights (OCR) will exercise its enforcement discretion and will not impose penalties on physicians using telehealth in the event of noncompliance with regulatory requirements under the [place] (HIPAA) in connection with the good faith provision of telehealth during the EXMDY-70 national public health emergency. ([place])

## 2019-05-25 ENCOUNTER — Telehealth: Payer: Self-pay

## 2019-05-25 DIAGNOSIS — M5416 Radiculopathy, lumbar region: Secondary | ICD-10-CM

## 2019-05-25 NOTE — Telephone Encounter (Signed)
She wants someone to call her to discuss her pain.

## 2019-05-25 NOTE — Telephone Encounter (Signed)
Called patient to discuss her pain. She is having increased pain in her lower back 8/10. Pain radiates to right buttocks down back of leg to foot. Affects right great toe, goes numb. She currently is taking Hydrocodone and is wondering if she should have a procedure.  What are your thoughts?

## 2019-05-26 NOTE — Telephone Encounter (Signed)
Sounds she is having a lumbar radicular flare. Can consider repeat caudal ESI that we did in past.  Order placed.

## 2019-05-30 NOTE — Telephone Encounter (Signed)
Can someone call and schedule this for her?

## 2019-05-30 NOTE — Telephone Encounter (Signed)
Sent this message to Noel.

## 2019-06-03 DIAGNOSIS — K529 Noninfective gastroenteritis and colitis, unspecified: Secondary | ICD-10-CM | POA: Insufficient documentation

## 2019-06-03 DIAGNOSIS — K921 Melena: Secondary | ICD-10-CM | POA: Insufficient documentation

## 2019-06-07 ENCOUNTER — Encounter: Payer: Self-pay | Admitting: Student in an Organized Health Care Education/Training Program

## 2019-06-08 ENCOUNTER — Other Ambulatory Visit: Payer: Self-pay

## 2019-06-08 ENCOUNTER — Ambulatory Visit
Payer: Medicare HMO | Attending: Student in an Organized Health Care Education/Training Program | Admitting: Student in an Organized Health Care Education/Training Program

## 2019-06-08 DIAGNOSIS — M533 Sacrococcygeal disorders, not elsewhere classified: Secondary | ICD-10-CM | POA: Diagnosis not present

## 2019-06-08 DIAGNOSIS — M5416 Radiculopathy, lumbar region: Secondary | ICD-10-CM | POA: Diagnosis not present

## 2019-06-08 DIAGNOSIS — M47816 Spondylosis without myelopathy or radiculopathy, lumbar region: Secondary | ICD-10-CM

## 2019-06-08 DIAGNOSIS — G8929 Other chronic pain: Secondary | ICD-10-CM

## 2019-06-08 DIAGNOSIS — M5136 Other intervertebral disc degeneration, lumbar region: Secondary | ICD-10-CM | POA: Diagnosis not present

## 2019-06-08 DIAGNOSIS — G894 Chronic pain syndrome: Secondary | ICD-10-CM

## 2019-06-08 MED ORDER — HYDROCODONE-ACETAMINOPHEN 5-325 MG PO TABS: 1.0000 | ORAL_TABLET | Freq: Two times a day (BID) | ORAL | 0 refills | Status: AC | PRN

## 2019-06-08 NOTE — Progress Notes (Signed)
Pain Management Virtual Encounter Note - Virtual Visit via Telephone Telehealth (real-time audio visits between healthcare provider and patient).   Patient's Phone No. & Preferred Pharmacy:  [phone] (home); [phone] (mobile); (Preferred) [phone] [email or web address]  [id number] [address], [place] [address] Phone: [phone] Fax: [phone]    Pre-screening note:  Our staff contacted [name] and offered her an "in person", "face-to-face" appointment versus a telephone encounter. She indicated preferring the telephone encounter, at this time.   Reason for Virtual Visit: COVID-19*  Social distancing based on CDC and AMA recommendations.   I contacted [name] on 06/08/2019 via telephone.      I clearly identified myself as Gillis Santa, MD. I verified that I was speaking with the correct person using two identifiers (Name: Tamara Finley, and date of birth: 06-Dec-1940).  Advanced Informed Consent I sought verbal advanced consent from [name] for virtual visit interactions. I informed [name] of possible security and privacy concerns, risks, and limitations associated with providing "not-in-person" medical evaluation and management services. I also informed [name] of the availability of "in-person" appointments. Finally, I informed her that there would be a charge for the virtual visit and that she could be  personally, fully or partially, financially responsible for it. [name] expressed understanding and agreed to proceed.   Historic Elements   Tamara Finley is a 78 y.o. year old, female patient evaluated today after her last encounter by our practice on 05/25/2019. Ms. Sylvester  has a past medical history of Abdominal aortic aneurysm (AAA) ([place]) (2000's), Age related entropion, unspecified laterality, Anxiety, Anxiety and depression, Bilateral renal artery stenosis ([place]), Breast calcification, left, Cancer  ([place]) (10/20/2016), Carotid artery thrombosis, bilateral, Carotid stenosis, Chronic bronchitis ([place]), Chronic insomnia, COPD (chronic obstructive pulmonary disease) ([place]), Coronary artery disease, CRI (chronic renal insufficiency), stage 3 (moderate), Depression, Diabetes mellitus without complication ([place]), GERD (gastroesophageal reflux disease), Heart palpitations, History of AAA (abdominal aortic aneurysm) repair, History of colon polyps, History of TIA (transient ischemic attack), Hypercholesterolemia, Hypertension, Hypothyroidism, Migraine headache, Osteoarthritis, Osteoporosis, Peripheral arterial disease ([place]), Pulmonary scarring, Renal disorder, SOB (shortness of breath), Stenosis of left subclavian artery ([place]), Stroke ([place]), Thyroid disease, TIA (transient ischemic attack), and Type 2 diabetes mellitus with vascular disease ([place]). She also  has a past surgical history that includes Cardiac surgery; Abdominal aortic aneurysm repair; Cardiac catheterization (N/A, 12/05/2015); Cardiac catheterization (Left, 12/31/2015); Breast excisional biopsy (Left, 2010); Breast excisional biopsy (Left, 07/01/2006); Breast biopsy (Left, 09/03/2016); Breast biopsy (Left, 09/25/2016); Bronchoscopy; CAROTID PTA/STENT INTERVENTION; Colonoscopy; Eye surgery; Laminectomy (07/25/2013); Back surgery; Mediastinoscopy; Mastectomy; Thoracoscopy (12/16/2016); and Colonoscopy with propofol (N/A, 06/15/2017). [name] has a current medication list which includes the following prescription(s): acetaminophen, albuterol, amlodipine, anastrozole, vitamin c, aspirin, atorvastatin, calcium carbonate-vitamin d, carvedilol, cholecalciferol, fluticasone, furosemide, gabapentin, gavilyte-n with flavor pack, hydralazine, hydrochlorothiazide, hydrocodone-acetaminophen, levothyroxine, magnesium, metoprolol succinate, mirtazapine, omega-3 acid ethyl esters, oxycodone, pantoprazole, peg 3350-kcl-nabcb-nacl-nasulf, polyethylene glycol powder, potassium  chloride sa, simvastatin, sodium chloride, tramadol, vitamin b-12, vitamin d (ergocalciferol), and gabapentin. She  reports that she quit smoking about 6 years ago. Her smoking use included cigarettes. She has a 100.00 pack-year smoking history. She has never used smokeless tobacco. She reports that she does not drink alcohol or use drugs. [name] is allergic to benadryl [diphenhydramine hcl (sleep)]; diphenhydramine hcl; and alprazolam.   HPI  Today, she is being contacted for medication management.   Discharged from hospital due  to rectal bleeding and colitis. She states that she is doing better from a GI standpoint I would like to hold off on lumbar facet radiofrequency ablation at this time until the patient has recovered from her bout of colitis.  She is endorsing fatigue at this time and would not recommend elective procedure until acute issue is resolved. Refill of hydrocodone as below.  Continue multimodal analgesics as prescribed.  Pharmacotherapy Assessment  Analgesic:   04/20/2019  1   12/28/2018  Hydrocodone-Acetamin 5-325 MG  60.00  30 Bi Lat   [id number]   Nor ([id number])   0  10.00 MME  Private Pay   [place]   Monitoring: Pharmacotherapy: No side-effects or adverse reactions reported. [place] PMP: PDMP reviewed during this encounter.       Compliance: No problems identified. Effectiveness: Clinically acceptable. Plan: Refer to "POC".  UDS: No results found for: SUMMARY Laboratory Chemistry Profile (12 mo)  Renal: No results found for requested labs within last 8760 hours.  Lab Results  Component Value Date   GFRAA >60 01/01/2016   GFRNONAA 53 (L) 01/01/2016   Hepatic: No results found for requested labs within last 8760 hours. Lab Results  Component Value Date   AST 28 10/17/2015   ALT 17 10/17/2015   Other: No results found for requested labs within last 8760 hours. Note: Above Lab results reviewed.  Imaging  Last 90 days:  Vas US Carotid  Result Date: 04/15/2019 Carotid  Arterial Duplex Study Comparison Study:  08/06/2018 Performing Technologist: Charlane Ferretti RT (R)(VS)  Examination Guidelines: A complete evaluation includes B-mode imaging, spectral Doppler, color Doppler, and power Doppler as needed of all accessible portions of each vessel. Bilateral testing is considered an integral part of a complete examination. Limited examinations for reoccurring indications may be performed as noted.  Right Carotid Findings: +----------+-------+--------+--------+---------------------+-------------------+           PSV    EDV cm/sStenosisDescribe             Comments                      cm/s                                                            +----------+-------+--------+--------+---------------------+-------------------+ CCA Prox  118    22              calcific             intimal thickening  +----------+-------+--------+--------+---------------------+-------------------+ CCA Mid   113    25              calcific and         intimal thickening                                   irregular                                +----------+-------+--------+--------+---------------------+-------------------+ CCA Distal137    34              calcific and         intimal thickening  irregular                                +----------+-------+--------+--------+---------------------+-------------------+ ICA Prox  213    62              calcific             ICA/CCA ratio =                                                           1.81                +----------+-------+--------+--------+---------------------+-------------------+ ICA Mid   152    25                                                       +----------+-------+--------+--------+---------------------+-------------------+ ICA Distal144    44                                                        +----------+-------+--------+--------+---------------------+-------------------+ ECA       248    11              calcific                                 +----------+-------+--------+--------+---------------------+-------------------+ +----------+--------+-------+--------+-------------------+           PSV cm/sEDV cmsDescribeArm Pressure (mmHG) +----------+--------+-------+--------+-------------------+ [id number]                                        +----------+--------+-------+--------+-------------------+ +---------+--------+---+--------+--+ VertebralPSV cm/s133EDV cm/s29 +---------+--------+---+--------+--+  Left Carotid Findings: +----------+--------+--------+--------+--------+--------------+           PSV cm/sEDV cm/sStenosisDescribeComments       +----------+--------+--------+--------+--------+--------------+ CCA Prox  73      18              calcific               +----------+--------+--------+--------+--------+--------------+ CCA Mid   74      21              calcific               +----------+--------+--------+--------+--------+--------------+ CCA Distal97      31                      Stent          +----------+--------+--------+--------+--------+--------------+ ICA Prox  107     28                      Stent          +----------+--------+--------+--------+--------+--------------+ ICA Mid   107     35  Not visualized +----------+--------+--------+--------+--------+--------------+ ICA Distal127     43                                     +----------+--------+--------+--------+--------+--------------+ ECA       325     15                                     +----------+--------+--------+--------+--------+--------------+ +----------+--------+--------+--------+-------------------+ SubclavianPSV cm/sEDV cm/sDescribeArm Pressure (mmHG) +----------+--------+--------+--------+-------------------+            205                                         +----------+--------+--------+--------+-------------------+ +---------+--------+--+--------+--+ VertebralPSV cm/s84EDV cm/s19 +---------+--------+--+--------+--+ Summary: Right Carotid: Velocities in the right ICA are consistent with a 40-59%                stenosis. The ECA appears >50% stenosed. Right ICA is consistant                with a 40-59% stenosis. No change from the previous exam on                08/06/2018. Left Carotid: Velocities in the left ICA are consistent with a 40-59% stenosis.               The ECA appears >50% stenosed. Left ICA is consistant with a               40-59% stenosis. This is increased from the previous exam on               12.6.2019. Vertebrals:  Bilateral vertebral arteries demonstrate antegrade flow. Subclavians: Bilateral subclavian artery flow was disturbed. *See table(s) above for measurements and observations.  Electronically signed by Leotis Pain MD on 04/15/2019 at 12:04:15 PM.    Final    Vas US Renal Artery Duplex  Result Date: 04/15/2019 ABDOMINAL VISCERAL High Risk Factors: Hypertension. Vascular Interventions: Lt RA stent 11/13/14, Aorto-biiliac for AAA and PVD                         08/11/2008. Limitations: Air/bowel gas and Very limited exam due to severe bowel gas. Comparison Study: 04/13/2018 Performing Technologist: Charlane Ferretti RT (R)(VS)  Examination Guidelines: A complete evaluation includes B-mode imaging, spectral Doppler, color Doppler, and power Doppler as needed of all accessible portions of each vessel. Bilateral testing is considered an integral part of a complete examination. Limited examinations for reoccurring indications may be performed as noted.  Duplex Findings: +----------+--------+--------+------+--------+ MesentericPSV cm/sEDV cm/sPlaqueComments +----------+--------+--------+------+--------+ Aorta Mid    73                           +----------+--------+--------+------+--------+  +------------------+--------+--------+-------+ Right Renal ArteryPSV cm/sEDV cm/sComment +------------------+--------+--------+-------+ Proximal            190                   +------------------+--------+--------+-------+ Mid                  73                   +------------------+--------+--------+-------+ Distal  123                   +------------------+--------+--------+-------+ +-----------------+--------+--------+-------+ Left Renal ArteryPSV cm/sEDV cm/sComment +-----------------+--------+--------+-------+ Proximal           203                   +-----------------+--------+--------+-------+ Mid                 70                   +-----------------+--------+--------+-------+ Distal              56                   +-----------------+--------+--------+-------+ Technologist observations Renal Artery(s):  +------------------+----+------------------+-----+ Right Kidney          Left Kidney             +------------------+----+------------------+-----+ RAR                   RAR                     +------------------+----+------------------+-----+ RAR (manual)      2.60RAR (manual)      2.78  +------------------+----+------------------+-----+ Cortex                Cortex                  +------------------+----+------------------+-----+ Cortex thickness      Corex thickness         +------------------+----+------------------+-----+ Kidney length (cm)5.00Kidney length (cm)11.40 +------------------+----+------------------+-----+  Summary: Renal:  Right: Abnormal size for the right kidney. Evidence of a greater        than 60% stenosis of the right renal artery. Left:  Normal size of left kidney. No evidence of left renal artery        stenosis. Cyst(s) noted. Left mid/lateral cyst measuring        1.73cm x 1.7cm x 1.96cm. No change from the previous exam on        04/13/2018.   *See table(s) above for measurements and observations.  Diagnosing physician: Leotis Pain MD  Electronically signed by Leotis Pain MD on 04/15/2019 at 12:04:13 PM.    Final     Assessment  The primary encounter diagnosis was Lumbar facet arthropathy (L4/L5 and L5/S1). Diagnoses of Lumbar spondylosis, Chronic SI joint pain, Lumbar degenerative disc disease, Lumbar radiculopathy, and Chronic pain syndrome were also pertinent to this visit.  Plan of Care  I am having [name] maintain her levothyroxine, metoprolol succinate, pantoprazole, albuterol, simvastatin, traMADol, aspirin, Calcium Carbonate-Vitamin D, fluticasone, cholecalciferol, sodium chloride, omega-3 acid ethyl esters, magnesium, amLODipine, hydrochlorothiazide, hydrALAZINE, vitamin B-12, furosemide, gabapentin, atorvastatin, acetaminophen, anastrozole, oxyCODONE, GaviLyte-N with Flavor Pack, polyethylene glycol powder, Vitamin C, potassium chloride SA, PEG 3350-KCl-NaBcb-NaCl-NaSulf (PEG-3350/ELECTROLYTES PO), mirtazapine, gabapentin, Vitamin D (Ergocalciferol), carvedilol, and HYDROcodone-acetaminophen.  Pharmacotherapy (Medications Ordered): Meds ordered this encounter  Medications  . HYDROcodone-acetaminophen (NORCO/VICODIN) 5-325 MG tablet    Sig: Take 1 tablet by mouth 2 (two) times daily as needed for severe pain. Must last 30 days.    Dispense:  60 tablet    Refill:  0    [place] STOP ACT - Not applicable. Fill one day early if pharmacy is closed on scheduled refill date.   Follow-up plan:   Return pt will call when needed.     consider RFA in future   Recent Visits Date  Type Provider Dept  04/19/19 Office Visit Gillis Santa, MD [facility]  Showing recent visits within past 90 days and meeting all other requirements   Today's Visits Date Type Provider Dept  06/08/19 Office Visit Gillis Santa, MD [facility]  Showing today's visits and meeting all other requirements   Future Appointments No visits  were found meeting these conditions.  Showing future appointments within next 90 days and meeting all other requirements   I discussed the assessment and treatment plan with the patient. The patient was provided an opportunity to ask questions and all were answered. The patient agreed with the plan and demonstrated an understanding of the instructions.  Patient advised to call back or seek an in-person evaluation if the symptoms or condition worsens.  Total duration of non-face-to-face encounter: 74minutes.  Note by: Gillis Santa, MD Date: 06/08/2019; Time: 2:56 PM  Note: This dictation was prepared with Dragon dictation. Any transcriptional errors that may result from this process are unintentional.  Disclaimer:  * Given the special circumstances of the COVID-19 pandemic, the federal government has announced that the Office for Civil Rights (OCR) will exercise its enforcement discretion and will not impose penalties on physicians using telehealth in the event of noncompliance with regulatory requirements under the [place] (HIPAA) in connection with the good faith provision of telehealth during the XX123456 national public health emergency. ([place])

## 2019-06-13 ENCOUNTER — Encounter: Payer: Medicare HMO | Admitting: Student in an Organized Health Care Education/Training Program

## 2019-06-14 ENCOUNTER — Encounter: Payer: Medicare HMO | Admitting: Student in an Organized Health Care Education/Training Program

## 2019-07-19 ENCOUNTER — Telehealth: Payer: Self-pay

## 2019-07-19 NOTE — Telephone Encounter (Signed)
He has no appointments for VV until December???

## 2019-07-19 NOTE — Telephone Encounter (Signed)
She has 3 days left of hydrocodone and Dr. Holley Raring is booked up till December. What can I do

## 2019-07-20 ENCOUNTER — Encounter: Payer: Self-pay | Admitting: Student in an Organized Health Care Education/Training Program

## 2019-07-20 NOTE — Telephone Encounter (Signed)
I just opened up a slot for tomorrow at 3. He did that with another patient today, so I felt ok opening up one for tomorrow too. She's fine with tomorrow appt.

## 2019-07-21 ENCOUNTER — Encounter: Payer: Self-pay | Admitting: Student in an Organized Health Care Education/Training Program

## 2019-07-21 ENCOUNTER — Ambulatory Visit
Payer: Medicare HMO | Attending: Student in an Organized Health Care Education/Training Program | Admitting: Student in an Organized Health Care Education/Training Program

## 2019-07-21 ENCOUNTER — Other Ambulatory Visit: Payer: Self-pay

## 2019-07-21 DIAGNOSIS — M5136 Other intervertebral disc degeneration, lumbar region: Secondary | ICD-10-CM

## 2019-07-21 DIAGNOSIS — G894 Chronic pain syndrome: Secondary | ICD-10-CM | POA: Diagnosis not present

## 2019-07-21 DIAGNOSIS — M47816 Spondylosis without myelopathy or radiculopathy, lumbar region: Secondary | ICD-10-CM

## 2019-07-21 DIAGNOSIS — M5416 Radiculopathy, lumbar region: Secondary | ICD-10-CM | POA: Diagnosis not present

## 2019-07-21 DIAGNOSIS — M533 Sacrococcygeal disorders, not elsewhere classified: Secondary | ICD-10-CM | POA: Diagnosis not present

## 2019-07-21 DIAGNOSIS — G8929 Other chronic pain: Secondary | ICD-10-CM

## 2019-07-21 MED ORDER — HYDROCODONE-ACETAMINOPHEN 5-325 MG PO TABS: 1.0000 | ORAL_TABLET | Freq: Two times a day (BID) | ORAL | 0 refills | Status: AC | PRN

## 2019-07-21 NOTE — Progress Notes (Signed)
Pain Management Virtual Encounter Note - Virtual Visit via [place] (real-time audio visits between healthcare provider and patient).   Patient's Phone No. & Preferred Pharmacy:  [phone] (home); [phone] (mobile); (Preferred) [phone] [email or web address]  [id number] [address] [address], [place] - [address] [place] [address] Phone: [phone] Fax: [phone]    Pre-screening note:  Our staff contacted Tamara Finley and offered her an "in person", "face-to-face" appointment versus a telephone encounter. She indicated preferring the telephone encounter, at this time.   Reason for Virtual Visit: COVID-19*  Social distancing based on CDC and AMA recommendations.   I contacted Tamara Finley on 07/21/2019 via video conference.      I clearly identified myself as Gillis Santa, MD. I verified that I was speaking with the correct person using two identifiers (Name: Tamara Finley, and date of birth: [birth year]).  Advanced Informed Consent I sought verbal advanced consent from Tamara Finley for virtual visit interactions. I informed Tamara Finley of possible security and privacy concerns, risks, and limitations associated with providing "not-in-person" medical evaluation and management services. I also informed Tamara Finley of the availability of "in-person" appointments. Finally, I informed her that there would be a charge for the virtual visit and that she could be  personally, fully or partially, financially responsible for it. Tamara Finley expressed understanding and agreed to proceed.   Historic Elements   Tamara Finley is a 78 y.o. year old, female patient evaluated today after her last encounter by our practice on 07/19/2019. Tamara Finley  has a past medical history of Abdominal aortic aneurysm (AAA) ([place]) (2000's), Age related entropion, unspecified laterality, Anxiety, Anxiety and depression, Bilateral renal artery stenosis ([place]), Breast calcification,  left, Cancer ([place]) (10/20/2016), Carotid artery thrombosis, bilateral, Carotid stenosis, Chronic bronchitis ([place]), Chronic insomnia, COPD (chronic obstructive pulmonary disease) ([place]), Coronary artery disease, CRI (chronic renal insufficiency), stage 3 (moderate), Depression, Diabetes mellitus without complication ([place]), GERD (gastroesophageal reflux disease), Heart palpitations, History of AAA (abdominal aortic aneurysm) repair, History of colon polyps, History of TIA (transient ischemic attack), Hypercholesterolemia, Hypertension, Hypothyroidism, Migraine headache, Osteoarthritis, Osteoporosis, Peripheral arterial disease ([place]), Pulmonary scarring, Renal disorder, SOB (shortness of breath), Stenosis of left subclavian artery ([place]), Stroke ([place]), Thyroid disease, TIA (transient ischemic attack), and Type 2 diabetes mellitus with vascular disease ([place]). She also  has a past surgical history that includes Cardiac surgery; Abdominal aortic aneurysm repair; Cardiac catheterization (N/A, 12/05/2015); Cardiac catheterization (Left, 12/31/2015); Breast excisional biopsy (Left, 2010); Breast excisional biopsy (Left, 07/01/2006); Breast biopsy (Left, 09/03/2016); Breast biopsy (Left, 09/25/2016); Bronchoscopy; CAROTID PTA/STENT INTERVENTION; Colonoscopy; Eye surgery; Laminectomy (07/25/2013); Back surgery; Mediastinoscopy; Mastectomy; Thoracoscopy (12/16/2016); and Colonoscopy with propofol (N/A, 06/15/2017). Tamara Finley has a current medication list which includes the following prescription(s): acetaminophen, albuterol, anastrozole, aspirin, atorvastatin, calcium carbonate-vitamin d, carvedilol, cholecalciferol, fluticasone, furosemide, gabapentin, hydralazine, hydrocodone-acetaminophen, hydrocodone-acetaminophen, levothyroxine, mirtazapine, omega-3 acid ethyl esters, pantoprazole, polyethylene glycol powder, potassium chloride sa, vitamin b-12, vitamin d (ergocalciferol), amlodipine, vitamin c, gabapentin, gavilyte-n with  flavor pack, hydrochlorothiazide, magnesium, metoprolol succinate, oxycodone, peg 3350-kcl-nabcb-nacl-nasulf, simvastatin, sodium chloride, and tramadol. She  reports that she quit smoking about 6 years ago. Her smoking use included cigarettes. She has a 100.00 pack-year smoking history. She has never used smokeless tobacco. She reports that she does not drink alcohol or use drugs. Tamara Finley is allergic to benadryl [diphenhydramine hcl (sleep)]; diphenhydramine hcl; and alprazolam.   HPI  Today, she is being contacted for medication management.  No change  in medical history since last visit.  Patient's pain is at baseline.  Patient continues multimodal pain regimen as prescribed.  States that it provides pain relief and improvement in functional status.   Pharmacotherapy Assessment  Analgesic:  /03/2019  1   06/08/2019  Hydrocodone-Acetamin 5-325 MG  60.00  30 Bi Lat   [id number]   Nor ([id number])   0  10.00 MME  Private Pay   [place]    Monitoring: Pharmacotherapy: No side-effects or adverse reactions reported. [place] PMP: PDMP reviewed during this encounter.       Compliance: No problems identified. Effectiveness: Clinically acceptable. Plan: Refer to "POC".  Laboratory Chemistry Profile (12 mo)  Renal: No results found for requested labs within last 8760 hours.  Lab Results  Component Value Date   GFRAA >60 01/01/2016   GFRNONAA 53 (L) 01/01/2016   Hepatic: No results found for requested labs within last 8760 hours. Lab Results  Component Value Date   AST 28 10/17/2015   ALT 17 10/17/2015   Other: No results found for requested labs within last 8760 hours. Note: Above Lab results reviewed.   Assessment  The primary encounter diagnosis was Lumbar spondylosis. Diagnoses of Chronic SI joint pain, Lumbar radiculopathy, Chronic pain syndrome, Lumbar facet arthropathy (L4/L5 and L5/S1), and Lumbar degenerative disc disease were also pertinent to this visit.  Plan of Care  I have changed Tamara Finley. [name]'s HYDROcodone-acetaminophen. I am also having her start on HYDROcodone-acetaminophen. Additionally, I am having her maintain her levothyroxine, metoprolol succinate, pantoprazole, albuterol, simvastatin, traMADol, aspirin, Calcium Carbonate-Vitamin D, fluticasone, cholecalciferol, sodium chloride, omega-3 acid ethyl esters, magnesium, amLODipine, hydrochlorothiazide, hydrALAZINE, vitamin B-12, furosemide, gabapentin, atorvastatin, acetaminophen, anastrozole, oxyCODONE, GaviLyte-N with Flavor Pack, polyethylene glycol powder, Vitamin C, potassium chloride SA, PEG 3350-KCl-NaBcb-NaCl-NaSulf (PEG-3350/ELECTROLYTES PO), mirtazapine, gabapentin, Vitamin D (Ergocalciferol), and carvedilol.  Pharmacotherapy (Medications Ordered): Meds ordered this encounter  Medications  . HYDROcodone-acetaminophen (NORCO/VICODIN) 5-325 MG tablet    Sig: Take 1 tablet by mouth every 12 (twelve) hours as needed for severe pain. For chronic pain. Ok to fill 1 day early if pharmacy closed on fill date.    Dispense:  60 tablet    Refill:  0  . HYDROcodone-acetaminophen (NORCO/VICODIN) 5-325 MG tablet    Sig: Take 1 tablet by mouth every 12 (twelve) hours as needed for severe pain. For chronic pain. Ok to fill 1 day early if pharmacy closed on fill date.    Dispense:  60 tablet    Refill:  0   Orders:  No orders of the defined types were placed in this encounter.  Follow-up plan:   No follow-ups on file.     consider RFA in future    Recent Visits Date Type Provider Dept  06/08/19 Office Visit Gillis Santa, MD [facility]  Showing recent visits within past 90 days and meeting all other requirements   Today's Visits Date Type Provider Dept  07/21/19 Office Visit Gillis Santa, MD [facility]  Showing today's visits and meeting all other requirements   Future Appointments No visits were found meeting these conditions.  Showing future appointments within next 90 days and meeting all  other requirements   I discussed the assessment and treatment plan with the patient. The patient was provided an opportunity to ask questions and all were answered. The patient agreed with the plan and demonstrated an understanding of the instructions.  Patient advised to call back or seek an in-person evaluation if the symptoms or condition worsens.  Total duration of non-face-to-face encounter:25 minutes.  Note by: Gillis Santa, MD Date: 07/21/2019; Time: 3:26 PM  Note: This dictation was prepared with Dragon dictation. Any transcriptional errors that may result from this process are unintentional.  Disclaimer:  * Given the special circumstances of the COVID-19 pandemic, the federal government has announced that the Office for Civil Rights (OCR) will exercise its enforcement discretion and will not impose penalties on physicians using telehealth in the event of noncompliance with regulatory requirements under the [place] (HIPAA) in connection with the good faith provision of telehealth during the XX123456 national public health emergency. ([place])

## 2019-08-04 DIAGNOSIS — N179 Acute kidney failure, unspecified: Secondary | ICD-10-CM | POA: Insufficient documentation

## 2019-08-04 DIAGNOSIS — I10 Essential (primary) hypertension: Secondary | ICD-10-CM | POA: Insufficient documentation

## 2019-09-15 ENCOUNTER — Ambulatory Visit: Payer: Medicare HMO | Admitting: Student in an Organized Health Care Education/Training Program

## 2019-09-15 ENCOUNTER — Other Ambulatory Visit: Payer: Self-pay

## 2019-11-02 ENCOUNTER — Encounter: Payer: Self-pay | Admitting: Student in an Organized Health Care Education/Training Program

## 2019-11-03 ENCOUNTER — Other Ambulatory Visit: Payer: Self-pay

## 2019-11-03 ENCOUNTER — Ambulatory Visit
Payer: Medicare HMO | Attending: Student in an Organized Health Care Education/Training Program | Admitting: Student in an Organized Health Care Education/Training Program

## 2019-11-03 DIAGNOSIS — M4726 Other spondylosis with radiculopathy, lumbar region: Secondary | ICD-10-CM | POA: Diagnosis not present

## 2019-11-03 DIAGNOSIS — M533 Sacrococcygeal disorders, not elsewhere classified: Secondary | ICD-10-CM

## 2019-11-03 DIAGNOSIS — M5136 Other intervertebral disc degeneration, lumbar region: Secondary | ICD-10-CM

## 2019-11-03 DIAGNOSIS — M47816 Spondylosis without myelopathy or radiculopathy, lumbar region: Secondary | ICD-10-CM

## 2019-11-03 DIAGNOSIS — G894 Chronic pain syndrome: Secondary | ICD-10-CM

## 2019-11-03 DIAGNOSIS — G8929 Other chronic pain: Secondary | ICD-10-CM

## 2019-11-03 DIAGNOSIS — M5416 Radiculopathy, lumbar region: Secondary | ICD-10-CM

## 2019-11-03 MED ORDER — HYDROCODONE-ACETAMINOPHEN 5-325 MG PO TABS: 1.0000 | ORAL_TABLET | Freq: Two times a day (BID) | ORAL | 0 refills | Status: AC | PRN

## 2019-11-03 NOTE — Progress Notes (Signed)
Patient: Tamara Finley  Service Category: E/M  Provider: Gillis Santa, MD  DOB: 19-May-1941  DOS: 11/03/2019  Location: Office  MRN: [id number]  Setting: Ambulatory outpatient  Referring Provider: Ezequiel Kayser, MD  Type: Established Patient  Specialty: Interventional Pain Management  PCP: Ezequiel Kayser, MD  Location: Home  Delivery: TeleHealth     Virtual Encounter - Pain Management PROVIDER NOTE: Information contained herein reflects review and annotations entered in association with encounter. Interpretation of such information and data should be left to medically-trained personnel. Information provided to patient can be located elsewhere in the medical record under "Patient Instructions". Document created using STT-dictation technology, any transcriptional errors that may result from process are unintentional.    Contact & Pharmacy Preferred: [phone] Home: [phone] (home) Mobile: [phone] (mobile) E-mail: [email or web address]  [id number] [place] [place] [address]Phone: [phone]: [phone]  Pre-screening  Ms. MHemoffered "in-person" vs "virtual" encounter. She indicated preferring virtual for this encounter.   Reason COVID-19*  Social distancing based on CDC and AMA recommendations.   I contacted [name] 11/03/2019 via telephone.      I clearly identified myself as BGillis Santa MD. I verified that I was speaking with the correct person using two identifiers (Name: Tamara Finley and date of birth: [birth year].  This visit was completed via telephone due to the restrictions of the COVID-19 pandemic. All issues as above were discussed and addressed but no physical exam was performed. If it was felt that the patient should be evaluated in the office, they were directed there. The patient verbally consented to this visit. Patient was unable to complete an audio/visual visit due to Technical difficulties and/or Lack  of internet. Due to the catastrophic nature of the COVID-19 pandemic, this visit was done through audio contact only.  Location of the patient: home address (see Epic for details)  Location of the provider: office Consent I sought verbal advanced consent from NMcarthur Rossettifor virtual visit interactions. I informed Ms. MAldapeof possible security and privacy concerns, risks, and limitations associated with providing "not-in-person" medical evaluation and management services. I also informed Tamara Finley the availability of "in-person" appointments. Finally, I informed her that there would be a charge for the virtual visit and that she could be  personally, fully or partially, financially responsible for it. Ms. MFoodyexpressed understanding and agreed to proceed.   Historic Elements   Ms. NTYERRA LORETTOis a 79y.o. year old, female patient evaluated today after her last contact with our practice on 07/21/2019. Tamara Finley has a past medical history of Abdominal aortic aneurysm (AAA) ([place] (2000's), Age related entropion, unspecified laterality, Anxiety, Anxiety and depression, Bilateral renal artery stenosis ([place], Breast calcification, left, Cancer ([place] (10/20/2016), Carotid artery thrombosis, bilateral, Carotid stenosis, Chronic bronchitis ([place], Chronic insomnia, COPD (chronic obstructive pulmonary disease) ([place], Coronary artery disease, CRI (chronic renal insufficiency), stage 3 (moderate), Depression, Diabetes mellitus without complication ([place], GERD (gastroesophageal reflux disease), Heart palpitations, History of AAA (abdominal aortic aneurysm) repair, History of colon polyps, History of TIA (transient ischemic attack), Hypercholesterolemia, Hypertension, Hypothyroidism, Migraine headache, Osteoarthritis, Osteoporosis, Peripheral arterial disease ([place], Pulmonary scarring, Renal disorder, SOB (shortness of breath), Stenosis of left subclavian artery ([place], Stroke ([place], Thyroid disease, TIA  (transient ischemic attack), and Type 2 diabetes mellitus with vascular disease ([place]. She also  has a past surgical history that includes Cardiac surgery; Abdominal aortic aneurysm  repair; Cardiac catheterization (N/A, 12/05/2015); Cardiac catheterization (Left, 12/31/2015); Breast excisional biopsy (Left, 2010); Breast excisional biopsy (Left, 07/01/2006); Breast biopsy (Left, 09/03/2016); Breast biopsy (Left, 09/25/2016); Bronchoscopy; CAROTID PTA/STENT INTERVENTION; Colonoscopy; Eye surgery; Laminectomy (07/25/2013); Back surgery; Mediastinoscopy; Mastectomy; Thoracoscopy (12/16/2016); and Colonoscopy with propofol (N/A, 06/15/2017). Tamara Finley has a current medication list which includes the following prescription(s): acetaminophen, albuterol, vitamin c, aspirin, atorvastatin, calcium 600+d, carvedilol, fluticasone, gabapentin, hydralazine, hydrochlorothiazide, hydrocodone-acetaminophen, [START ON 12/03/2019] hydrocodone-acetaminophen, levothyroxine, mirtazapine, pantoprazole, polyethylene glycol powder, potassium chloride sa, simvastatin, sodium chloride, vitamin b-12, amlodipine, anastrozole, calcium 600+d plus minerals, cholecalciferol, furosemide, gabapentin, gavilyte-n with flavor pack, hydralazine, magnesium, metoprolol succinate, emergen-c vitamin c, omega-3 acid ethyl esters, oxycodone, peg 3350-kcl-nabcb-nacl-nasulf, tramadol, and vitamin d (ergocalciferol). She  reports that she quit smoking about 6 years ago. Her smoking use included cigarettes. She has a 100.00 pack-year smoking history. She has never used smokeless tobacco. She reports that she does not drink alcohol or use drugs. Tamara Finley is allergic to benadryl [diphenhydramine hcl (sleep)]; diphenhydramine hcl; and alprazolam.   HPI  Today, she is being contacted for medication management.   No change in medical history since last visit.  Patient's pain is at baseline.  Patient continues multimodal pain regimen as prescribed.  States that it  provides pain relief and improvement in functional status.  Pharmacotherapy Assessment  Analgesic: 09/15/2019  1   07/21/2019  Hydrocodone-Acetamin 5-325 MG  60.00  30 Bi Lat   [id number]   Nor (4705)   0  10.00 MME  Medicare   [place]    Monitoring: [place] PMP: PDMP reviewed during this encounter.       Pharmacotherapy: No side-effects or adverse reactions reported. Compliance: No problems identified. Effectiveness: Clinically acceptable. Plan: Refer to "POC".  UDS: No results found for: SUMMARY Laboratory Chemistry Profile   Renal Lab Results  Component Value Date   BUN 9 01/01/2016   CREATININE 1.03 (H) 01/01/2016   GFRAA >60 01/01/2016   GFRNONAA 53 (L) 01/01/2016    Hepatic Lab Results  Component Value Date   AST 28 10/17/2015   ALT 17 10/17/2015   ALBUMIN 3.7 10/17/2015   ALKPHOS 64 10/17/2015    Electrolytes Lab Results  Component Value Date   NA 139 01/01/2016   K 3.5 01/01/2016   CL 106 01/01/2016   CALCIUM 8.2 (L) 01/01/2016    Bone No results found for: VD25OH, [id number], TF5732KG2, [id number], 25OHVITD1, 25OHVITD2, 25OHVITD3, TESTOFREE, TESTOSTERONE  Inflammation (CRP: Acute Phase) (ESR: Chronic Phase) Lab Results  Component Value Date   ESRSEDRATE 23 10/12/2015      Note: Above Lab results reviewed.  Imaging  VAS US CAROTID Carotid Arterial Duplex Study  Comparison Study:  08/06/2018  Performing Technologist: Charlane Ferretti RT (R)(VS)    Examination Guidelines: A complete evaluation includes B-mode imaging, spectral Doppler, color Doppler, and power Doppler as needed of all accessible portions of each vessel. Bilateral testing is considered an integral part of a complete examination. Limited examinations for reoccurring indications may be performed as noted.    Right Carotid Findings: +----------+-------+--------+--------+---------------------+-------------------+           PSV    EDV cm/sStenosisDescribe             Comments                       cm/s                                                            +----------+-------+--------+--------+---------------------+-------------------+  CCA Prox  118    22              calcific             intimal thickening  +----------+-------+--------+--------+---------------------+-------------------+ CCA Mid   113    25              calcific and         intimal thickening                                   irregular                                +----------+-------+--------+--------+---------------------+-------------------+ CCA Distal137    34              calcific and         intimal thickening                                   irregular                                +----------+-------+--------+--------+---------------------+-------------------+ ICA Prox  213    62              calcific             ICA/CCA ratio =                                                           1.81                +----------+-------+--------+--------+---------------------+-------------------+ ICA Mid   152    25                                                       +----------+-------+--------+--------+---------------------+-------------------+ ICA Distal144    44                                                       +----------+-------+--------+--------+---------------------+-------------------+ ECA       248    11              calcific                                 +----------+-------+--------+--------+---------------------+-------------------+  +----------+--------+-------+--------+-------------------+           PSV cm/sEDV cmsDescribeArm Pressure (mmHG) +----------+--------+-------+--------+-------------------+ [id number]                                        +----------+--------+-------+--------+-------------------+  +---------+--------+---+--------+--+ VertebralPSV cm/s133EDV  cm/s29 +---------+--------+---+--------+--+  Left Carotid Findings: +----------+--------+--------+--------+--------+--------------+           PSV cm/sEDV cm/sStenosisDescribeComments       +----------+--------+--------+--------+--------+--------------+ CCA Prox  73      18              calcific               +----------+--------+--------+--------+--------+--------------+ CCA Mid   74      21              calcific               +----------+--------+--------+--------+--------+--------------+ CCA Distal97      31                      Stent          +----------+--------+--------+--------+--------+--------------+ ICA Prox  107     28                      Stent          +----------+--------+--------+--------+--------+--------------+ ICA Mid   107     35                      Not visualized +----------+--------+--------+--------+--------+--------------+ ICA Distal127     43                                     +----------+--------+--------+--------+--------+--------------+ ECA       325     15                                     +----------+--------+--------+--------+--------+--------------+  +----------+--------+--------+--------+-------------------+ SubclavianPSV cm/sEDV cm/sDescribeArm Pressure (mmHG) +----------+--------+--------+--------+-------------------+           205                                         +----------+--------+--------+--------+-------------------+  +---------+--------+--+--------+--+ VertebralPSV cm/s84EDV cm/s19 +---------+--------+--+--------+--+  Summary: Right Carotid: Velocities in the right ICA are consistent with a 40-59%                stenosis. The ECA appears >50% stenosed. Right ICA is consistant                with a 40-59% stenosis. No change from the previous exam on                08/06/2018.  Left Carotid: Velocities in the left ICA are consistent with a 40-59%  stenosis.               The ECA appears >50% stenosed. Left ICA is consistant with a               40-59% stenosis. This is increased from the previous exam on               12.6.2019.  Vertebrals:  Bilateral vertebral arteries demonstrate antegrade flow. Subclavians: Bilateral subclavian artery flow was disturbed.  *See table(s) above for measurements and observations.    Electronically signed by Leotis Pain MD on 04/15/2019 at 12:04:15 PM.      Final   VAS US RENAL ARTERY DUPLEX ABDOMINAL VISCERAL  High Risk Factors: Hypertension.  Vascular Interventions: Lt  RA stent 11/13/14, Aorto-biiliac for AAA and PVD                         08/11/2008.  Limitations: Air/bowel gas and Very limited exam due to severe bowel gas. Comparison Study: 04/13/2018  Performing Technologist: Charlane Ferretti RT (R)(VS)    Examination Guidelines: A complete evaluation includes B-mode imaging, spectral Doppler, color Doppler, and power Doppler as needed of all accessible portions of each vessel. Bilateral testing is considered an integral part of a complete examination. Limited examinations for reoccurring indications may be performed as noted.    Duplex Findings: +----------+--------+--------+------+--------+ MesentericPSV cm/sEDV cm/sPlaqueComments +----------+--------+--------+------+--------+ Aorta Mid    73                          +----------+--------+--------+------+--------+    +------------------+--------+--------+-------+ Right Renal ArteryPSV cm/sEDV cm/sComment +------------------+--------+--------+-------+ Proximal            190                   +------------------+--------+--------+-------+ Mid                  73                   +------------------+--------+--------+-------+ Distal              123                   +------------------+--------+--------+-------+  +-----------------+--------+--------+-------+ Left Renal ArteryPSV cm/sEDV  cm/sComment +-----------------+--------+--------+-------+ Proximal           203                   +-----------------+--------+--------+-------+ Mid                 70                   +-----------------+--------+--------+-------+ Distal              56                   +-----------------+--------+--------+-------+  Technologist observations Renal Artery(s):   +------------------+----+------------------+-----+ Right Kidney          Left Kidney             +------------------+----+------------------+-----+ RAR                   RAR                     +------------------+----+------------------+-----+ RAR (manual)      2.60RAR (manual)      2.78  +------------------+----+------------------+-----+ Cortex                Cortex                  +------------------+----+------------------+-----+ Cortex thickness      Corex thickness         +------------------+----+------------------+-----+ Kidney length (cm)5.00Kidney length (cm)11.40 +------------------+----+------------------+-----+    Summary: Renal:   Right: Abnormal size for the right kidney. Evidence of a greater        than 60% stenosis of the right renal artery. Left:  Normal size of left kidney. No evidence of left renal artery        stenosis. Cyst(s) noted. Left mid/lateral cyst measuring        1.73cm x 1.7cm x 1.96cm. No change from the previous exam  on        04/13/2018.   *See table(s) above for measurements and observations.   Diagnosing physician: Leotis Pain MD   Electronically signed by Leotis Pain MD on 04/15/2019 at 12:04:13 PM.       Final    Assessment  The primary encounter diagnosis was Chronic SI joint pain. Diagnoses of Lumbar spondylosis, Lumbar radiculopathy, Lumbar degenerative disc disease, Lumbar facet arthropathy (L4/L5 and L5/S1), and Chronic pain syndrome were also pertinent to this visit.  Plan of Care   Ms. MCKINZEY ENTWISTLE has a current  medication list which includes the following long-term medication(s): albuterol, calcium 600+d, carvedilol, gabapentin, levothyroxine, mirtazapine, pantoprazole, simvastatin, calcium 600+d plus minerals, furosemide, gabapentin, and metoprolol succinate.  Pharmacotherapy (Medications Ordered): Meds ordered this encounter  Medications  . HYDROcodone-acetaminophen (NORCO/VICODIN) 5-325 MG tablet    Sig: Take 1 tablet by mouth every 12 (twelve) hours as needed for moderate pain or severe pain. For chronic pain syndrome    Dispense:  60 tablet    Refill:  0  . HYDROcodone-acetaminophen (NORCO/VICODIN) 5-325 MG tablet    Sig: Take 1 tablet by mouth every 12 (twelve) hours as needed for moderate pain or severe pain. For chronic pain syndrome    Dispense:  60 tablet    Refill:  0   Follow-up plan:   Return pt will call.     consider RFA in future     Recent Visits No visits were found meeting these conditions.  Showing recent visits within past 90 days and meeting all other requirements   Today's Visits Date Type Provider Dept  11/03/19 Office Visit Gillis Santa, MD [facility]  Showing today's visits and meeting all other requirements   Future Appointments No visits were found meeting these conditions.  Showing future appointments within next 90 days and meeting all other requirements   I discussed the assessment and treatment plan with the patient. The patient was provided an opportunity to ask questions and all were answered. The patient agreed with the plan and demonstrated an understanding of the instructions.  Patient advised to call back or seek an in-person evaluation if the symptoms or condition worsens.  Duration of encounter: 25 minutes.  Note by: Gillis Santa, MD Date: 11/03/2019; Time: 9:53 AM

## 2020-02-01 ENCOUNTER — Telehealth: Payer: Self-pay | Admitting: *Deleted

## 2020-02-01 NOTE — Telephone Encounter (Signed)
Left  Voicemail for patient to please call to over medications prior to VV on 02/02/20

## 2020-02-02 ENCOUNTER — Other Ambulatory Visit: Payer: Self-pay

## 2020-02-02 ENCOUNTER — Ambulatory Visit
Payer: Medicare HMO | Attending: Student in an Organized Health Care Education/Training Program | Admitting: Student in an Organized Health Care Education/Training Program

## 2020-02-02 ENCOUNTER — Encounter: Payer: Self-pay | Admitting: Student in an Organized Health Care Education/Training Program

## 2020-02-02 DIAGNOSIS — M5416 Radiculopathy, lumbar region: Secondary | ICD-10-CM

## 2020-02-02 DIAGNOSIS — M533 Sacrococcygeal disorders, not elsewhere classified: Secondary | ICD-10-CM

## 2020-02-02 DIAGNOSIS — G894 Chronic pain syndrome: Secondary | ICD-10-CM

## 2020-02-02 DIAGNOSIS — M47816 Spondylosis without myelopathy or radiculopathy, lumbar region: Secondary | ICD-10-CM

## 2020-02-02 DIAGNOSIS — M5136 Other intervertebral disc degeneration, lumbar region: Secondary | ICD-10-CM | POA: Diagnosis not present

## 2020-02-02 DIAGNOSIS — G8929 Other chronic pain: Secondary | ICD-10-CM

## 2020-02-02 MED ORDER — HYDROCODONE-ACETAMINOPHEN 5-325 MG PO TABS: 1.0000 | ORAL_TABLET | Freq: Two times a day (BID) | ORAL | 0 refills | Status: AC | PRN

## 2020-02-02 NOTE — Progress Notes (Signed)
**Note De-Identified Tamara Obfuscation** Patient: Tamara Finley  Service Category: E/Finley  Provider: Gillis Santa, MD  DOB: 03-Nov-1940  DOS: 02/02/2020  Location: Office  MRN: [id number]  Setting: Ambulatory outpatient  Referring Provider: Ezequiel Kayser, MD  Type: Established Patient  Specialty: Interventional Pain Management  PCP: Ezequiel Kayser, MD  Location: Home  Delivery: TeleHealth     Virtual Encounter - Pain Management PROVIDER NOTE: Information contained herein reflects review and annotations entered in association with encounter. Interpretation of such information and data should be left to medically-trained personnel. Information provided to patient can be located elsewhere in the medical record under "Patient Instructions". Document created using STT-dictation technology, any transcriptional errors that may result from process are unintentional.    Contact & Pharmacy Preferred: [phone] Home: [phone] (home) Mobile: [phone] (mobile) E-mail: [email or web address]  [id number] [place] [address]Phone: [phone]: [phone]  Pre-screening  Ms. MTilsonoffered "in-person" vs "virtual" encounter. She indicated preferring virtual for this encounter.   Reason COVID-19*  Social distancing based on CDC and AMA recommendations.   I contacted Tamara Finley 02/02/2020 Tamara televisit.      I clearly identified myself as BGillis Santa MD. I verified that I was speaking with the correct person using two identifiers (Name: Tamara Finley and date of birth: [id number].  Consent I sought verbal advanced consent from Tamara Finley. I informed Tamara Finley possible security and privacy concerns, risks, and limitations associated with providing "not-in-person" medical evaluation and management services. I also informed Tamara Finley the availability of "in-person" appointments. Finally, I informed her that there would be a charge for the  virtual visit and that she could be  personally, fully or partially, financially responsible for it. Tamara Finley understanding and agreed to proceed.   Historic Elements   Ms. Tamara MANGELSis a 79y.o. year old, female patient evaluated today after her last contact with our practice on 02/01/2020. [name] has a past medical history of Abdominal aortic aneurysm (AAA) ([place] (2000[place]), Age related entropion, unspecified laterality, Anxiety, Anxiety and depression, Bilateral renal artery stenosis ([place], Breast calcification, left, Cancer ([place] (10/20/2016), Carotid artery thrombosis, bilateral, Carotid stenosis, Chronic bronchitis ([place], Chronic insomnia, COPD (chronic obstructive pulmonary disease) ([place], Coronary artery disease, CRI (chronic renal insufficiency), stage 3 (moderate), Depression, Diabetes mellitus without complication ([place], GERD (gastroesophageal reflux disease), Heart palpitations, History of AAA (abdominal aortic aneurysm) repair, History of colon polyps, History of TIA (transient ischemic attack), Hypercholesterolemia, Hypertension, Hypothyroidism, Migraine headache, Osteoarthritis, Osteoporosis, Peripheral arterial disease ([place], Pulmonary scarring, Renal disorder, SOB (shortness of breath), Stenosis of left subclavian artery ([place], Stroke ([place], Thyroid disease, TIA (transient ischemic attack), and Type 2 diabetes mellitus with vascular disease ([place]. She also  has a past surgical history that includes Cardiac surgery; Abdominal aortic aneurysm repair; Cardiac catheterization (N/A, 12/05/2015); Cardiac catheterization (Left, 12/31/2015); Breast excisional biopsy (Left, 2010); Breast excisional biopsy (Left, 07/01/2006); Breast biopsy (Left, 09/03/2016); Breast biopsy (Left, 09/25/2016); Bronchoscopy; CAROTID PTA/STENT INTERVENTION; Colonoscopy; Eye surgery; Laminectomy (07/25/2013); Back surgery; Mediastinoscopy; Mastectomy; Thoracoscopy (12/16/2016); and Colonoscopy with propofol (N/A,  06/15/2017). Ms. MBlanchardhas a current medication list which includes the following prescription(s): acetaminophen, albuterol, amlodipine, anastrozole, vitamin c, aspirin, atorvastatin, calcium 600+d plus minerals, calcium 600+d, carvedilol, cholecalciferol, fluticasone, furosemide, gabapentin, gabapentin, gavilyte-n with flavor pack, hydralazine, hydralazine, hydrochlorothiazide, hydrocodone-acetaminophen, [START ON 03/03/2020] hydrocodone-acetaminophen, levothyroxine, magnesium, metoprolol succinate, mirtazapine, emergen-c vitamin c, omega-3 acid  ethyl esters, pantoprazole, peg [id number]-nabcb-nacl-nasulf, polyethylene glycol powder, potassium chloride sa, simvastatin, sodium chloride, tramadol, vitamin b-12, and vitamin d (ergocalciferol). She  reports that she quit smoking about 6 years ago. Her smoking use included cigarettes. She has a 100.00 pack-year smoking history. She has never used smokeless tobacco. She reports that she does not drink alcohol or use drugs. Tamara Finley is allergic to benadryl [diphenhydramine hcl (sleep)]; diphenhydramine hcl; and alprazolam.   HPI  Today, she is being contacted for medication management.   No change in medical history since last visit.  Patient's pain is at baseline.  Patient continues multimodal pain regimen as prescribed.  States that it provides pain relief and improvement in functional status.   Pharmacotherapy Assessment  Analgesic: 12/16/2019  1   11/03/2019  Hydrocodone-Acetamin 5-325 MG  60.00  30 Bi Lat   [id number]   Nor (4705)   0  10.00 MME  Medicare   [place]   Monitoring: [place] PMP: PDMP reviewed during this encounter.       Pharmacotherapy: No side-effects or adverse reactions reported. Compliance: No problems identified. Effectiveness: Clinically acceptable. Plan: Refer to "POC".  UDS: No results found for: SUMMARY needs to perform, order placed  Laboratory Chemistry Profile   Renal Lab Results  Component Value Date   BUN 9 01/01/2016    CREATININE 1.03 (H) 01/01/2016   GFRAA >60 01/01/2016   GFRNONAA 53 (L) 01/01/2016     Hepatic Lab Results  Component Value Date   AST 28 10/17/2015   ALT 17 10/17/2015   ALBUMIN 3.7 10/17/2015   ALKPHOS 64 10/17/2015     Electrolytes Lab Results  Component Value Date   NA 139 01/01/2016   K 3.5 01/01/2016   CL 106 01/01/2016   CALCIUM 8.2 (L) 01/01/2016     Bone No results found for: VD25OH, [id number], [id number], PY0998PJ8, 25OHVITD1, 25OHVITD2, 25OHVITD3, TESTOFREE, TESTOSTERONE   Inflammation (CRP: Acute Phase) (ESR: Chronic Phase) Lab Results  Component Value Date   ESRSEDRATE 23 10/12/2015       Note: Above Lab results reviewed.   Imaging  VAS US CAROTID Carotid Arterial Duplex Study  Comparison Study:  08/06/2018  Performing Technologist: Charlane Ferretti RT (R)(VS)    Examination Guidelines: A complete evaluation includes B-mode imaging, spectral Doppler, color Doppler, and power Doppler as needed of all accessible portions of each vessel. Bilateral testing is considered an integral part of a complete examination. Limited examinations for reoccurring indications may be performed as noted.    Right Carotid Findings: +----------+-------+--------+--------+---------------------+-------------------+           PSV    EDV cm/sStenosisDescribe             Comments                      cm/s                                                            +----------+-------+--------+--------+---------------------+-------------------+ CCA Prox  118    22              calcific             intimal thickening  +----------+-------+--------+--------+---------------------+-------------------+ CCA Mid   113    25  calcific and         intimal thickening                                   irregular                                +----------+-------+--------+--------+---------------------+-------------------+ CCA Distal137     34              calcific and         intimal thickening                                   irregular                                +----------+-------+--------+--------+---------------------+-------------------+ ICA Prox  213    62              calcific             ICA/CCA ratio =                                                           1.81                +----------+-------+--------+--------+---------------------+-------------------+ ICA Mid   152    25                                                       +----------+-------+--------+--------+---------------------+-------------------+ ICA Distal144    44                                                       +----------+-------+--------+--------+---------------------+-------------------+ ECA       248    11              calcific                                 +----------+-------+--------+--------+---------------------+-------------------+  +----------+--------+-------+--------+-------------------+           PSV cm/sEDV cmsDescribeArm Pressure (mmHG) +----------+--------+-------+--------+-------------------+ [id number]                                        +----------+--------+-------+--------+-------------------+  +---------+--------+---+--------+--+ VertebralPSV cm/s133EDV cm/s29 +---------+--------+---+--------+--+    Left Carotid Findings: +----------+--------+--------+--------+--------+--------------+           PSV cm/sEDV cm/sStenosisDescribeComments       +----------+--------+--------+--------+--------+--------------+ CCA Prox  73      18              calcific               +----------+--------+--------+--------+--------+--------------+  CCA Mid   74      21              calcific               +----------+--------+--------+--------+--------+--------------+ CCA Distal97      31                      Stent           +----------+--------+--------+--------+--------+--------------+ ICA Prox  107     28                      Stent          +----------+--------+--------+--------+--------+--------------+ ICA Mid   107     35                      Not visualized +----------+--------+--------+--------+--------+--------------+ ICA Distal127     43                                     +----------+--------+--------+--------+--------+--------------+ ECA       325     15                                     +----------+--------+--------+--------+--------+--------------+  +----------+--------+--------+--------+-------------------+ SubclavianPSV cm/sEDV cm/sDescribeArm Pressure (mmHG) +----------+--------+--------+--------+-------------------+           205                                         +----------+--------+--------+--------+-------------------+  +---------+--------+--+--------+--+ VertebralPSV cm/s84EDV cm/s19 +---------+--------+--+--------+--+  Summary: Right Carotid: Velocities in the right ICA are consistent with a 40-59%                stenosis. The ECA appears >50% stenosed. Right ICA is consistant                with a 40-59% stenosis. No change from the previous exam on                08/06/2018.  Left Carotid: Velocities in the left ICA are consistent with a 40-59% stenosis.               The ECA appears >50% stenosed. Left ICA is consistant with a               40-59% stenosis. This is increased from the previous exam on               12.6.2019.  Vertebrals:  Bilateral vertebral arteries demonstrate antegrade flow. Subclavians: Bilateral subclavian artery flow was disturbed.  *See table(s) above for measurements and observations.    Electronically signed by Leotis Pain MD on 04/15/2019 at 12:04:15 PM.      Final   VAS US RENAL ARTERY DUPLEX ABDOMINAL VISCERAL  High Risk Factors: Hypertension.  Vascular Interventions: Lt RA stent  11/13/14, Aorto-biiliac for AAA and PVD                         08/11/2008.  Limitations: Air/bowel gas and Very limited exam due to severe bowel gas. Comparison Study: 04/13/2018  Performing Technologist: Charlane Ferretti RT (R)(VS)    Examination Guidelines: A  complete evaluation includes B-mode imaging, spectral Doppler, color Doppler, and power Doppler as needed of all accessible portions of each vessel. Bilateral testing is considered an integral part of a complete examination. Limited examinations for reoccurring indications may be performed as noted.    Duplex Findings: +----------+--------+--------+------+--------+ MesentericPSV cm/sEDV cm/sPlaqueComments +----------+--------+--------+------+--------+ Aorta Mid    73                          +----------+--------+--------+------+--------+    +------------------+--------+--------+-------+ Right Renal ArteryPSV cm/sEDV cm/sComment +------------------+--------+--------+-------+ Proximal            190                   +------------------+--------+--------+-------+ Mid                  73                   +------------------+--------+--------+-------+ Distal              123                   +------------------+--------+--------+-------+  +-----------------+--------+--------+-------+ Left Renal ArteryPSV cm/sEDV cm/sComment +-----------------+--------+--------+-------+ Proximal           203                   +-----------------+--------+--------+-------+ Mid                 70                   +-----------------+--------+--------+-------+ Distal              56                   +-----------------+--------+--------+-------+  Technologist observations Renal Artery(s):   +------------------+----+------------------+-----+ Right Kidney          Left Kidney             +------------------+----+------------------+-----+ RAR                   RAR                      +------------------+----+------------------+-----+ RAR (manual)      2.60RAR (manual)      2.78  +------------------+----+------------------+-----+ Cortex                Cortex                  +------------------+----+------------------+-----+ Cortex thickness      Corex thickness         +------------------+----+------------------+-----+ Kidney length (cm)5.00Kidney length (cm)11.40 +------------------+----+------------------+-----+    Summary: Renal:   Right: Abnormal size for the right kidney. Evidence of a greater        than 60% stenosis of the right renal artery. Left:  Normal size of left kidney. No evidence of left renal artery        stenosis. Cyst(s) noted. Left mid/lateral cyst measuring        1.73cm x 1.7cm x 1.96cm. No change from the previous exam on        04/13/2018.   *See table(s) above for measurements and observations.   Diagnosing physician: Leotis Pain MD   Electronically signed by Leotis Pain MD on 04/15/2019 at 12:04:13 PM.       Final    Assessment  The primary encounter diagnosis was Chronic SI joint pain. Diagnoses of Lumbar  spondylosis, Lumbar radiculopathy, Lumbar degenerative disc disease, Lumbar facet arthropathy (L4/L5 and L5/S1), and Chronic pain syndrome were also pertinent to this visit.  Plan of Care  Ms. JORDYNE POEHLMAN has a current medication list which includes the following long-term medication(s): albuterol, calcium 600+d plus minerals, calcium 600+d, carvedilol, furosemide, gabapentin, gabapentin, levothyroxine, metoprolol succinate, mirtazapine, pantoprazole, and simvastatin.  Pharmacotherapy (Medications Ordered): Meds ordered this encounter  Medications  . HYDROcodone-acetaminophen (NORCO/VICODIN) 5-325 MG tablet    Sig: Take 1 tablet by mouth every 12 (twelve) hours as needed for severe pain. Must last 30 days.    Dispense:  60 tablet    Refill:  0    Chronic Pain. (STOP Act - Not applicable). Fill one  day early if closed on scheduled refill date.  Marland Kitchen HYDROcodone-acetaminophen (NORCO/VICODIN) 5-325 MG tablet    Sig: Take 1 tablet by mouth every 12 (twelve) hours as needed for severe pain. Must last 30 days.    Dispense:  60 tablet    Refill:  0    Chronic Pain. (STOP Act - Not applicable). Fill one day early if closed on scheduled refill date.   Orders:  Orders Placed This Encounter  Procedures  . ToxASSURE Select 13 (MW), Urine    Volume: 30 ml(s). Minimum 3 ml of urine is needed. Document temperature of fresh sample. Indications: Long term (current) use of opiate analgesic (X18.550)   Follow-up plan:   Return in about 12 weeks (around 04/26/2020) for Medication Management, in person.     consider RFA in future      Recent Visits No visits were found meeting these conditions.  Showing recent visits within past 90 days and meeting all other requirements   Today's Visits Date Type Provider Dept  02/02/20 Telemedicine Gillis Santa, MD [facility]  Showing today's visits and meeting all other requirements   Future Appointments Date Type Provider Dept  05/01/20 Appointment Gillis Santa, MD [facility]  Showing future appointments within next 90 days and meeting all other requirements   I discussed the assessment and treatment plan with the patient. The patient was provided an opportunity to ask questions and all were answered. The patient agreed with the plan and demonstrated an understanding of the instructions.  Patient advised to call back or seek an in-person evaluation if the symptoms or condition worsens.  Duration of encounter: 30 minutes.  Note by: Gillis Santa, MD Date: 02/02/2020; Time: 11:13 AM

## 2020-04-20 ENCOUNTER — Encounter (INDEPENDENT_AMBULATORY_CARE_PROVIDER_SITE_OTHER): Payer: Medicare HMO

## 2020-04-20 ENCOUNTER — Ambulatory Visit (INDEPENDENT_AMBULATORY_CARE_PROVIDER_SITE_OTHER): Payer: Medicare HMO | Admitting: Vascular Surgery

## 2020-05-01 ENCOUNTER — Other Ambulatory Visit: Payer: Self-pay

## 2020-05-01 ENCOUNTER — Encounter: Payer: Self-pay | Admitting: Student in an Organized Health Care Education/Training Program

## 2020-05-01 ENCOUNTER — Ambulatory Visit
Payer: Medicare HMO | Attending: Student in an Organized Health Care Education/Training Program | Admitting: Student in an Organized Health Care Education/Training Program

## 2020-05-01 VITALS — BP 128/72 | HR 65 | Temp 97.3°F | Resp 18 | Ht 64.0 in | Wt 183.0 lb

## 2020-05-01 DIAGNOSIS — M5136 Other intervertebral disc degeneration, lumbar region: Secondary | ICD-10-CM | POA: Diagnosis present

## 2020-05-01 DIAGNOSIS — M533 Sacrococcygeal disorders, not elsewhere classified: Secondary | ICD-10-CM | POA: Diagnosis present

## 2020-05-01 DIAGNOSIS — M25552 Pain in left hip: Secondary | ICD-10-CM

## 2020-05-01 DIAGNOSIS — M47816 Spondylosis without myelopathy or radiculopathy, lumbar region: Secondary | ICD-10-CM | POA: Insufficient documentation

## 2020-05-01 DIAGNOSIS — Z79899 Other long term (current) drug therapy: Secondary | ICD-10-CM | POA: Insufficient documentation

## 2020-05-01 DIAGNOSIS — M5441 Lumbago with sciatica, right side: Secondary | ICD-10-CM | POA: Insufficient documentation

## 2020-05-01 DIAGNOSIS — G894 Chronic pain syndrome: Secondary | ICD-10-CM

## 2020-05-01 DIAGNOSIS — G8929 Other chronic pain: Secondary | ICD-10-CM

## 2020-05-01 DIAGNOSIS — M5442 Lumbago with sciatica, left side: Secondary | ICD-10-CM | POA: Diagnosis present

## 2020-05-01 DIAGNOSIS — M5416 Radiculopathy, lumbar region: Secondary | ICD-10-CM | POA: Insufficient documentation

## 2020-05-01 DIAGNOSIS — M25551 Pain in right hip: Secondary | ICD-10-CM

## 2020-05-01 MED ORDER — HYDROCODONE-ACETAMINOPHEN 5-325 MG PO TABS: 1.0000 | ORAL_TABLET | Freq: Two times a day (BID) | ORAL | 0 refills | Status: AC | PRN

## 2020-05-01 MED ORDER — HYDROCODONE-ACETAMINOPHEN 5-325 MG PO TABS: 1.0000 | ORAL_TABLET | Freq: Two times a day (BID) | ORAL | 0 refills | Status: DC | PRN

## 2020-05-01 NOTE — Progress Notes (Signed)
PROVIDER NOTE: Information contained herein reflects review and annotations entered in association with encounter. Interpretation of such information and data should be left to medically-trained personnel. Information provided to patient can be located elsewhere in the medical record under "Patient Instructions". Document created using STT-dictation technology, any transcriptional errors that may result from process are unintentional.    Patient: Tamara Finley  Service Category: E/M  Provider: Gillis Santa, MD  DOB: April 29, 1941  DOS: 05/01/2020  Specialty: Interventional Pain Management  MRN: [id number]  Setting: Ambulatory outpatient  PCP: Ezequiel Kayser, MD  Type: Established Patient    Referring Provider: Ezequiel Kayser, MD  Location: Office  Delivery: Face-to-face     HPI  Reason for encounter: Tamara Finley, a 79 y.o. year old female, is here today for evaluation and management of her Chronic SI joint pain [M53.3, G89.29]. [name] primary complain today is Back Pain (lower) Last encounter: Practice (02/01/2020). My last encounter with her was on 11/03/2019. Pertinent problems: [name] has Low back pain radiating to both legs; Peripheral arterial disease ([place]); Right hip pain; Chronic SI joint pain; Lumbar facet arthropathy (L4/L5 and L5/S1); Controlled substance agreement signed; Lumbar spondylosis; and Lumbar radiculopathy on their pertinent problem list. Pain Assessment: Severity of Chronic pain is reported as a 7 /10. Location: Back Lower/both legs to the feet. Onset: More than a month ago. Quality: Aching, Dull. Timing: Constant. Modifying factor(s): sitting, rest. Vitals:  height is 5' 4"  (1.626 m) and weight is 183 lb (83 kg). Her temporal temperature is 97.3 F (36.3 C) (abnormal). Her blood pressure is 128/72 and her pulse is 65. Her respiration is 18 and oxygen saturation is 98%.   No change in medical history since last visit.  Patient's pain is at baseline.  Patient continues  multimodal pain regimen as prescribed.  States that it provides pain relief and improvement in functional status. Discussed bowel health with patient.  Recommend that she take a stool softener such as Dulcolax every 2 days. Repeat UDS today.  Pharmacotherapy Assessment   03/19/2020  1   02/02/2020  Hydrocodone-Acetamin 5-325 MG  60.00  30 Bi Lat   [id number]   Nor (4705)   0/0  10.00 MME  Medicare   [place]      Monitoring: [place] PMP: PDMP reviewed during this encounter.       Pharmacotherapy: No side-effects or adverse reactions reported. Compliance: No problems identified. Effectiveness: Clinically acceptable.  Landis Martins, RN  05/01/2020  2:05 PM  Sign when Signing Visit Nursing Pain Medication Assessment:  Safety precautions to be maintained throughout the outpatient stay will include: orient to surroundings, keep bed in low position, maintain call bell within reach at all times, provide assistance with transfer out of bed and ambulation.  Medication Inspection Compliance: Pill count conducted under aseptic conditions, in front of the patient. Neither the pills nor the bottle was removed from the patient's sight at any time. Once count was completed pills were immediately returned to the patient in their original bottle.  Medication: Hydrocodone/APAP Pill/Patch Count: 11 of 60 pills remain Pill/Patch Appearance: Markings consistent with prescribed medication Bottle Appearance: Standard pharmacy container. Clearly labeled. Filled Date: 03/19/2020 Last Medication intake:  Today    UDS: No results found for: SUMMARY   ROS  Constitutional: Denies any fever or chills Gastrointestinal: No reported hemesis, hematochezia, vomiting, or acute GI distress Musculoskeletal: Low back, bilateral buttock, bilateral hip pain Neurological: No reported episodes of acute onset apraxia, aphasia, dysarthria, agnosia,  amnesia, paralysis, loss of coordination, or loss of consciousness  Medication Review   Calcium 600+D Plus Minerals, HYDROcodone-acetaminophen, acetaminophen, albuterol, anastrozole, aspirin, atorvastatin, carvedilol, fluticasone, furosemide, gabapentin, hydrALAZINE, levothyroxine, mirtazapine, pantoprazole, potassium chloride SA, and vitamin B-12  History Review  Allergy: [name] is allergic to benadryl [diphenhydramine hcl (sleep)] and alprazolam. Drug: [name]  reports no history of drug use. Alcohol:  reports no history of alcohol use. Tobacco:  reports that she quit smoking about 6 years ago. Her smoking use included cigarettes. She has a 100.00 pack-year smoking history. She has never used smokeless tobacco. Social: [name]  reports that she quit smoking about 6 years ago. Her smoking use included cigarettes. She has a 100.00 pack-year smoking history. She has never used smokeless tobacco. She reports that she does not drink alcohol and does not use drugs. Medical:  has a past medical history of Abdominal aortic aneurysm (AAA) ([place]) (2000's), Age related entropion, unspecified laterality, Anxiety, Anxiety and depression, Bilateral renal artery stenosis ([place]), Breast calcification, left, Cancer ([place]) (10/20/2016), Carotid artery thrombosis, bilateral, Carotid stenosis, Chronic bronchitis (H[facility]), Chronic insomnia, COPD (chronic obstructive pulmonary disease) ([place]), Coronary artery disease, CRI (chronic renal insufficiency), stage 3 (moderate), Depression, Diabetes mellitus without complication ([place]), GERD (gastroesophageal reflux disease), Heart palpitations, History of AAA (abdominal aortic aneurysm) repair, History of colon polyps, History of TIA (transient ischemic attack), Hypercholesterolemia, Hypertension, Hypothyroidism, Migraine headache, Osteoarthritis, Osteoporosis, Peripheral arterial disease ([place]), Pulmonary scarring, Renal disorder, SOB (shortness of breath), 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 Surgical: [name]  has a past surgical history that includes Cardiac surgery; Abdominal aortic aneurysm repair; Cardiac catheterization (N/A, 12/05/2015); Cardiac catheterization (Left, 12/31/2015); Breast excisional biopsy (Left, 2010); Breast excisional biopsy (Left, 07/01/2006); Breast biopsy (Left, 09/03/2016); Breast biopsy (Left, 09/25/2016); Bronchoscopy; CAROTID PTA/STENT INTERVENTION; Colonoscopy; Eye surgery; Laminectomy (07/25/2013); Back surgery; Mediastinoscopy; Mastectomy; Thoracoscopy (12/16/2016); and Colonoscopy with propofol (N/A, 06/15/2017). Family: family history includes CVA in her mother; Cancer in her brother; Coronary artery disease in her father; Dementia in her father; Diabetes in her brother; Epilepsy in her mother; Hypertension in her father and mother; Ovarian cancer in her sister; Stroke in her father and mother.  Laboratory Chemistry Profile   Renal Lab Results  Component Value Date   BUN 9 01/01/2016   CREATININE 1.03 (H) 01/01/2016   GFRAA >60 01/01/2016   GFRNONAA 53 (L) 01/01/2016     Hepatic Lab Results  Component Value Date   AST 28 10/17/2015   ALT 17 10/17/2015   ALBUMIN 3.7 10/17/2015   ALKPHOS 64 10/17/2015     Electrolytes Lab Results  Component Value Date   NA 139 01/01/2016   K 3.5 01/01/2016   CL 106 01/01/2016   CALCIUM 8.2 (L) 01/01/2016     Bone No results found for: VD25OH, [id number], QM5784ON6, [id number], 25OHVITD1, 25OHVITD2, 25OHVITD3, TESTOFREE, TESTOSTERONE   Inflammation (CRP: Acute Phase) (ESR: Chronic Phase) Lab Results  Component Value Date   ESRSEDRATE 23 10/12/2015       Note: Above Lab results reviewed.  Recent Imaging Review  VAS US CAROTID Carotid Arterial Duplex Study  Comparison Study:  08/06/2018  Performing Technologist: Charlane Ferretti RT (R)(VS)    Examination Guidelines: A complete evaluation includes B-mode imaging, spectral Doppler, color  Doppler, and power Doppler as needed of all accessible portions of each vessel. Bilateral testing is considered an integral part of a complete examination. Limited  examinations for reoccurring indications may be performed as noted.    Right Carotid Findings: +----------+-------+--------+--------+---------------------+-------------------+           PSV    EDV cm/sStenosisDescribe             Comments                      cm/s                                                            +----------+-------+--------+--------+---------------------+-------------------+ CCA Prox  118    22              calcific             intimal thickening  +----------+-------+--------+--------+---------------------+-------------------+ CCA Mid   113    25              calcific and         intimal thickening                                   irregular                                +----------+-------+--------+--------+---------------------+-------------------+ CCA Distal137    34              calcific and         intimal thickening                                   irregular                                +----------+-------+--------+--------+---------------------+-------------------+ ICA Prox  213    62              calcific             ICA/CCA ratio =                                                           1.81                +----------+-------+--------+--------+---------------------+-------------------+ ICA Mid   152    25                                                       +----------+-------+--------+--------+---------------------+-------------------+ ICA Distal144    44                                                       +----------+-------+--------+--------+---------------------+-------------------+  ECA       248    11              calcific                                  +----------+-------+--------+--------+---------------------+-------------------+  +----------+--------+-------+--------+-------------------+           PSV cm/sEDV cmsDescribeArm Pressure (mmHG) +----------+--------+-------+--------+-------------------+ [id number]                                        +----------+--------+-------+--------+-------------------+  +---------+--------+---+--------+--+ VertebralPSV cm/s133EDV cm/s29 +---------+--------+---+--------+--+    Left Carotid Findings: +----------+--------+--------+--------+--------+--------------+           PSV cm/sEDV cm/sStenosisDescribeComments       +----------+--------+--------+--------+--------+--------------+ CCA Prox  73      18              calcific               +----------+--------+--------+--------+--------+--------------+ CCA Mid   74      21              calcific               +----------+--------+--------+--------+--------+--------------+ CCA Distal97      31                      Stent          +----------+--------+--------+--------+--------+--------------+ ICA Prox  107     28                      Stent          +----------+--------+--------+--------+--------+--------------+ ICA Mid   107     35                      Not visualized +----------+--------+--------+--------+--------+--------------+ ICA Distal127     43                                     +----------+--------+--------+--------+--------+--------------+ ECA       325     15                                     +----------+--------+--------+--------+--------+--------------+  +----------+--------+--------+--------+-------------------+ SubclavianPSV cm/sEDV cm/sDescribeArm Pressure (mmHG) +----------+--------+--------+--------+-------------------+           205                                          +----------+--------+--------+--------+-------------------+  +---------+--------+--+--------+--+ VertebralPSV cm/s84EDV cm/s19 +---------+--------+--+--------+--+  Summary: Right Carotid: Velocities in the right ICA are consistent with a 40-59%                stenosis. The ECA appears >50% stenosed. Right ICA is consistant                with a 40-59% stenosis. No change from the previous exam on                08/06/2018.  Left Carotid: Velocities in the left ICA are consistent with a  40-59% stenosis.               The ECA appears >50% stenosed. Left ICA is consistant with a               40-59% stenosis. This is increased from the previous exam on               12.6.2019.  Vertebrals:  Bilateral vertebral arteries demonstrate antegrade flow. Subclavians: Bilateral subclavian artery flow was disturbed.  *See table(s) above for measurements and observations.    Electronically signed by Leotis Pain MD on 04/15/2019 at 12:04:15 PM.      Final   VAS US RENAL ARTERY DUPLEX ABDOMINAL VISCERAL  High Risk Factors: Hypertension.  Vascular Interventions: Lt RA stent 11/13/14, Aorto-biiliac for AAA and PVD                         08/11/2008.  Limitations: Air/bowel gas and Very limited exam due to severe bowel gas. Comparison Study: 04/13/2018  Performing Technologist: Charlane Ferretti RT (R)(VS)    Examination Guidelines: A complete evaluation includes B-mode imaging, spectral Doppler, color Doppler, and power Doppler as needed of all accessible portions of each vessel. Bilateral testing is considered an integral part of a complete examination. Limited examinations for reoccurring indications may be performed as noted.    Duplex Findings: +----------+--------+--------+------+--------+ MesentericPSV cm/sEDV cm/sPlaqueComments +----------+--------+--------+------+--------+ Aorta Mid    73                           +----------+--------+--------+------+--------+    +------------------+--------+--------+-------+ Right Renal ArteryPSV cm/sEDV cm/sComment +------------------+--------+--------+-------+ Proximal            190                   +------------------+--------+--------+-------+ Mid                  73                   +------------------+--------+--------+-------+ Distal              123                   +------------------+--------+--------+-------+  +-----------------+--------+--------+-------+ Left Renal ArteryPSV cm/sEDV cm/sComment +-----------------+--------+--------+-------+ Proximal           203                   +-----------------+--------+--------+-------+ Mid                 70                   +-----------------+--------+--------+-------+ Distal              56                   +-----------------+--------+--------+-------+  Technologist observations Renal Artery(s):   +------------------+----+------------------+-----+ Right Kidney          Left Kidney             +------------------+----+------------------+-----+ RAR                   RAR                     +------------------+----+------------------+-----+ RAR (manual)      2.60RAR (manual)      2.78  +------------------+----+------------------+-----+ Cortex  Cortex                  +------------------+----+------------------+-----+ Cortex thickness      Corex thickness         +------------------+----+------------------+-----+ Kidney length (cm)5.00Kidney length (cm)11.40 +------------------+----+------------------+-----+    Summary: Renal:   Right: Abnormal size for the right kidney. Evidence of a greater        than 60% stenosis of the right renal artery. Left:  Normal size of left kidney. No evidence of left renal artery        stenosis. Cyst(s) noted. Left mid/lateral cyst measuring        1.73cm x 1.7cm x 1.96cm.  No change from the previous exam on        04/13/2018.   *See table(s) above for measurements and observations.   Diagnosing physician: Leotis Pain MD   Electronically signed by Leotis Pain MD on 04/15/2019 at 12:04:13 PM.       Final   Note: Reviewed        Physical Exam  General appearance: Well nourished, well developed, and well hydrated. In no apparent acute distress Mental status: Alert, oriented x 3 (person, place, & time)       Respiratory: No evidence of acute respiratory distress Eyes: PERLA Vitals: BP 128/72   Pulse 65   Temp (!) 97.3 F (36.3 C) (Temporal)   Resp 18   Ht 5' 4"  (1.626 m)   Wt 183 lb (83 kg)   SpO2 98%   BMI 31.41 kg/m  BMI: Estimated body mass index is 31.41 kg/m as calculated from the following:   Height as of this encounter: 5' 4"  (1.626 m).   Weight as of this encounter: 183 lb (83 kg). Ideal: Ideal body weight: 54.7 kg (120 lb 9.5 oz) Adjusted ideal body weight: 66 kg (145 lb 8.9 oz)   Lumbar Spine Area Exam  Skin & Axial Inspection: No masses, redness, or swelling Alignment: Symmetrical Functional ROM: Decreased ROM affecting both sides Stability: No instability detected Muscle Tone/Strength: Functionally intact. No obvious neuro-muscular anomalies detected. Sensory (Neurological): Musculoskeletal pain pattern and dermatomal  Gait & Posture Assessment  Ambulation: Patient came in today in a wheel chair Gait: Limited. Using assistive device to ambulate Posture: Difficulty standing up straight, due to pain  Lower Extremity Exam    Side: Right lower extremity  Side: Left lower extremity  Stability: No instability observed          Stability: No instability observed          Skin & Extremity Inspection: Skin color, temperature, and hair growth are WNL. No peripheral edema or cyanosis. No masses, redness, swelling, asymmetry, or associated skin lesions. No contractures.  Skin & Extremity Inspection: Skin color, temperature, and hair growth are  WNL. No peripheral edema or cyanosis. No masses, redness, swelling, asymmetry, or associated skin lesions. No contractures.  Functional ROM: Pain restricted ROM for all joints of the lower extremity          Functional ROM: Pain restricted ROM for all joints of the lower extremity          Muscle Tone/Strength: Functionally intact. No obvious neuro-muscular anomalies detected.  Muscle Tone/Strength: Functionally intact. No obvious neuro-muscular anomalies detected.  Sensory (Neurological): Musculoskeletal pain pattern        Sensory (Neurological): Musculoskeletal pain pattern        DTR: Patellar: deferred today Achilles: deferred today Plantar: deferred today  DTR: Patellar: deferred today Achilles: deferred today  Plantar: deferred today  Palpation: No palpable anomalies  Palpation: No palpable anomalies     Assessment   Status Diagnosis  Controlled Controlled Controlled 1. Chronic SI joint pain   2. Lumbar spondylosis   3. Lumbar radiculopathy   4. Lumbar degenerative disc disease   5. Lumbar facet arthropathy (L4/L5 and L5/S1)   6. Chronic pain syndrome   7. Controlled substance agreement signed   8. Chronic bilateral low back pain with bilateral sciatica   9. Pain of both hip joints (R>L)      Updated Problems: Problem  Lumbar Spondylosis  Lumbar Radiculopathy  Lumbar facet arthropathy (L4/L5 and L5/S1)  Controlled Substance Agreement Signed  Low Back Pain Radiating to Both Legs  Chronic Si Joint Pain  Right Hip Pain  Peripheral Arterial Disease ([facility])    Plan of Care   Ms. CALANDRA MADURA has a current medication list which includes the following long-term medication(s): albuterol, calcium 600+d plus minerals, carvedilol, furosemide, gabapentin, levothyroxine, mirtazapine, pantoprazole, and gabapentin.  Pharmacotherapy (Medications Ordered): Meds ordered this encounter  Medications  . HYDROcodone-acetaminophen (NORCO/VICODIN) 5-325 MG tablet    Sig: Take 1  tablet by mouth 2 (two) times daily as needed for moderate pain.    Dispense:  60 tablet    Refill:  0  . HYDROcodone-acetaminophen (NORCO/VICODIN) 5-325 MG tablet    Sig: Take 1 tablet by mouth 2 (two) times daily as needed for moderate pain.    Dispense:  60 tablet    Refill:  0  . HYDROcodone-acetaminophen (NORCO/VICODIN) 5-325 MG tablet    Sig: Take 1 tablet by mouth 2 (two) times daily as needed for moderate pain.    Dispense:  60 tablet    Refill:  0   UDS today, order placed  Follow-up plan:   Return in about 3 months (around 07/31/2020) for Medication Management, in person.     consider RFA in future, Sprint PNS of medial branch at L4      Recent Visits Date Type Provider Dept  02/02/20 Telemedicine Gillis Santa, MD [facility]  Showing recent visits within past 90 days and meeting all other requirements Today's Visits Date Type Provider Dept  05/01/20 Office Visit Gillis Santa, MD [facility]  Showing today's visits and meeting all other requirements Future Appointments No visits were found meeting these conditions. Showing future appointments within next 90 days and meeting all other requirements  I discussed the assessment and treatment plan with the patient. The patient was provided an opportunity to ask questions and all were answered. The patient agreed with the plan and demonstrated an understanding of the instructions.  Patient advised to call back or seek an in-person evaluation if the symptoms or condition worsens.  Duration of encounter:30 minutes.  Note by: Gillis Santa, MD Date: 05/01/2020; Time: 2:54 PM

## 2020-05-01 NOTE — Patient Instructions (Signed)
UDS Follow up in 3 months

## 2020-05-01 NOTE — Progress Notes (Signed)
Nursing Pain Medication Assessment:  Safety precautions to be maintained throughout the outpatient stay will include: orient to surroundings, keep bed in low position, maintain call bell within reach at all times, provide assistance with transfer out of bed and ambulation.  Medication Inspection Compliance: Pill count conducted under aseptic conditions, in front of the patient. Neither the pills nor the bottle was removed from the patient's sight at any time. Once count was completed pills were immediately returned to the patient in their original bottle.  Medication: Hydrocodone/APAP Pill/Patch Count: 11 of 60 pills remain Pill/Patch Appearance: Markings consistent with prescribed medication Bottle Appearance: Standard pharmacy container. Clearly labeled. Filled Date: 03/19/2020 Last Medication intake:  Today

## 2020-05-11 LAB — TOXASSURE SELECT 13 (MW), URINE

## 2020-06-01 ENCOUNTER — Ambulatory Visit (INDEPENDENT_AMBULATORY_CARE_PROVIDER_SITE_OTHER): Payer: Medicare HMO | Admitting: Vascular Surgery

## 2020-06-01 ENCOUNTER — Other Ambulatory Visit: Payer: Self-pay

## 2020-06-01 ENCOUNTER — Ambulatory Visit (INDEPENDENT_AMBULATORY_CARE_PROVIDER_SITE_OTHER): Payer: Medicare HMO

## 2020-06-01 ENCOUNTER — Encounter (INDEPENDENT_AMBULATORY_CARE_PROVIDER_SITE_OTHER): Payer: Self-pay | Admitting: Vascular Surgery

## 2020-06-01 VITALS — Resp 16 | Wt 180.0 lb

## 2020-06-01 DIAGNOSIS — Z9889 Other specified postprocedural states: Secondary | ICD-10-CM

## 2020-06-01 DIAGNOSIS — I6522 Occlusion and stenosis of left carotid artery: Secondary | ICD-10-CM

## 2020-06-01 DIAGNOSIS — M7989 Other specified soft tissue disorders: Secondary | ICD-10-CM | POA: Insufficient documentation

## 2020-06-01 DIAGNOSIS — E785 Hyperlipidemia, unspecified: Secondary | ICD-10-CM | POA: Diagnosis not present

## 2020-06-01 DIAGNOSIS — I701 Atherosclerosis of renal artery: Secondary | ICD-10-CM

## 2020-06-01 DIAGNOSIS — I150 Renovascular hypertension: Secondary | ICD-10-CM

## 2020-06-01 DIAGNOSIS — E1159 Type 2 diabetes mellitus with other circulatory complications: Secondary | ICD-10-CM

## 2020-06-01 NOTE — Assessment & Plan Note (Signed)
Seems to be a little worse.  We discussed the lymphedema pump to her compression socks and elevation.  She does not want to have this done at this time, but will consider it at her next visit.

## 2020-06-01 NOTE — Assessment & Plan Note (Signed)
Duplex today shows an atrophic right kidney measuring less than 6 cm without obvious renal artery stenosis.  The left renal artery velocities are normal without hemodynamically significant left renal artery stenosis.  Clinically doing well.  Continue current medical regimen.  Given her basically solitary left kidney with previous intervention, we will keep her on short interval follow-up 6 months.

## 2020-06-01 NOTE — Progress Notes (Signed)
MRN : [id number]  Tamara Finley is a 79 y.o. (08/13/1941) female who presents with chief complaint of  Chief Complaint  Patient presents with  . Follow-up    ultrasound follow up  .  History of Present Illness: Patient returns today in follow up of multiple vascular issues.  She is doing fairly well today.  She is noticing a little more swelling particular in her left leg.  She is wearing her stockings and elevating her legs.  We have previously discussed a lymphedema pump but she was not interested in that previously and still does not want to have this. She also has carotid disease which we are following today.  No focal neurologic symptoms. Specifically, the patient denies amaurosis fugax, speech or swallowing difficulties, or arm or leg weakness or numbness.  She is 4 years status post left carotid stent placement.  Carotid duplex today reveals 60 to 79% right ICA stenosis in 1-39 left ICA stenosis stent.  This has progressed a little on the right since her last study. Her other vascular issue is renal artery stenosis.  She had reintervention for renal artery stenosis to an essentially solitary left kidney about 4 to 5 years ago.  Her blood pressure has been better.  Her renal function is relatively stable with chronic kidney disease stage III.  Duplex today shows an atrophic right kidney measuring less than 6 cm without obvious renal artery stenosis.  The left renal artery velocities are normal without hemodynamically significant left renal artery stenosis.  Current Outpatient Medications  Medication Sig Dispense Refill  . acetaminophen (TYLENOL) 500 MG tablet Take 1,000 mg by mouth every 8 (eight) hours as needed for moderate pain.     Marland Kitchen albuterol (PROVENTIL HFA;VENTOLIN HFA) 108 (90 BASE) MCG/ACT inhaler Inhale 2 puffs into the lungs every 6 (six) hours as needed for wheezing or shortness of breath. Reported on 12/31/2015    . anastrozole (ARIMIDEX) 1 MG tablet Take 1 mg by mouth daily.      Marland Kitchen aspirin 81 MG chewable tablet Chew 81 mg by mouth daily.    Marland Kitchen atorvastatin (LIPITOR) 40 MG tablet Take 40 mg by mouth daily at 6 PM.     . Calcium Carbonate-Vit D-Min (CALCIUM 600+D PLUS MINERALS) 600-400 MG-UNIT TABS Take by mouth.    . carvedilol (COREG) 6.25 MG tablet Take 6.25 mg by mouth 2 (two) times daily with a meal.    . fluticasone (FLONASE) 50 MCG/ACT nasal spray Place 1-2 sprays into both nostrils daily as needed for rhinitis.    . furosemide (LASIX) 40 MG tablet Take 1 tablet by mouth 1 day or 1 dose.    . gabapentin (NEURONTIN) 300 MG capsule Take 600 mg by mouth at bedtime.     . hydrALAZINE (APRESOLINE) 50 MG tablet Take 50 mg by mouth 3 (three) times daily.    Marland Kitchen HYDROcodone-acetaminophen (NORCO/VICODIN) 5-325 MG tablet Take 1 tablet by mouth 2 (two) times daily as needed for moderate pain. 60 tablet 0  . [START ON 06/03/2020] HYDROcodone-acetaminophen (NORCO/VICODIN) 5-325 MG tablet Take 1 tablet by mouth 2 (two) times daily as needed for moderate pain. 60 tablet 0  . [START ON 07/03/2020] HYDROcodone-acetaminophen (NORCO/VICODIN) 5-325 MG tablet Take 1 tablet by mouth 2 (two) times daily as needed for moderate pain. 60 tablet 0  . levothyroxine (SYNTHROID, LEVOTHROID) 88 MCG tablet Take 88 mcg by mouth daily before breakfast.    . mirtazapine (REMERON) 15 MG tablet TAKE 1 TABLET BY  MOUTH EVERY DAY AT NIGHT  3  . pantoprazole (PROTONIX) 40 MG tablet Take 40 mg by mouth daily.    . potassium chloride SA (K-DUR,KLOR-CON) 20 MEQ tablet Take 20 mEq by mouth as directed.    . vitamin B-12 (CYANOCOBALAMIN) 1000 MCG tablet Take 1 tablet by mouth 1 day or 1 dose.    . gabapentin (NEURONTIN) 300 MG capsule Take 1 capsule by mouth 1 day or 1 dose.     No current facility-administered medications for this visit.    Past Medical History:  Diagnosis Date  . Abdominal aortic aneurysm (AAA) ([place]) 2000's  . Age related entropion, unspecified laterality   . Anxiety   . Anxiety and  depression   . Bilateral renal artery stenosis ([place])   . Breast calcification, left   . Cancer ([place]) 10/20/2016   breast cancer  . Carotid artery thrombosis, bilateral   . Carotid stenosis   . Chronic bronchitis ([place])   . Chronic insomnia   . COPD (chronic obstructive pulmonary disease) ([place])   . Coronary artery disease   . CRI (chronic renal insufficiency), stage 3 (moderate) (HCC)   . Depression   . Diabetes mellitus without complication ([place])   . GERD (gastroesophageal reflux disease)   . Heart palpitations   . History of AAA (abdominal aortic aneurysm) repair   . History of colon polyps   . History of TIA (transient ischemic attack)   . Hypercholesterolemia   . Hypertension   . Hypothyroidism   . Migraine headache   . Osteoarthritis   . Osteoporosis   . Peripheral arterial disease ([place])   . Pulmonary scarring   . Renal disorder   . SOB (shortness of breath)   . Stenosis of left subclavian artery (HCC)   . Stroke ([place])   . Thyroid disease   . TIA (transient ischemic attack)   . Type 2 diabetes mellitus with vascular disease [facility])     Past Surgical History:  Procedure Laterality Date  . ABDOMINAL AORTIC ANEURYSM REPAIR    . BACK SURGERY    . BREAST BIOPSY Left 09/03/2016   benign  . BREAST BIOPSY Left 09/25/2016   path pending  . BREAST EXCISIONAL BIOPSY Left 2010  . BREAST EXCISIONAL BIOPSY Left 07/01/2006  . BRONCHOSCOPY    . CARDIAC SURGERY    . CAROTID PTA/STENT INTERVENTION    . COLONOSCOPY    . COLONOSCOPY WITH PROPOFOL N/A 06/15/2017   Procedure: COLONOSCOPY WITH PROPOFOL;  Surgeon: Lollie Sails, MD;  Location: [facility] ENDOSCOPY;  Service: Endoscopy;  Laterality: N/A;  . EYE SURGERY    . LAMINECTOMY  07/25/2013  . MASTECTOMY    . MEDIASTINOSCOPY    . PERIPHERAL VASCULAR CATHETERIZATION N/A 12/05/2015   Procedure: Renal Angiography;  Surgeon: Katha Cabal, MD;  Location: [facility] CV LAB;  Service: Cardiovascular;  Laterality: N/A;  .  PERIPHERAL VASCULAR CATHETERIZATION Left 12/31/2015   Procedure: Carotid PTA/Stent Intervention;  Surgeon: Algernon Huxley, MD;  Location: [facility] CV LAB;  Service: Cardiovascular;  Laterality: Left;  . THORACOSCOPY  12/16/2016     Social History   Tobacco Use  . Smoking status: Former Smoker    Packs/day: 2.00    Years: 50.00    Pack years: 100.00    Types: Cigarettes    Quit date: 06/01/2013    Years since quitting: 7.0  . Smokeless tobacco: Never Used  Vaping Use  . Vaping Use: Never used  Substance Use Topics  .  Alcohol use: No    Alcohol/week: 0.0 standard drinks  . Drug use: No     Family History  Problem Relation Age of Onset  . CVA Mother   . Hypertension Mother   . Stroke Mother   . Epilepsy Mother   . Dementia Father   . Stroke Father   . Coronary artery disease Father   . Hypertension Father   . Ovarian cancer Sister   . Cancer Brother   . Diabetes Brother     Allergies  Allergen Reactions  . Benadryl [Diphenhydramine Hcl (Sleep)] Other (See Comments)    BAD DREAMS  . Alprazolam Rash    "bad dreams"    REVIEW OF SYSTEMS(Negative unless checked)  Constitutional: [] ??Weight loss[] ??Fever[] ??Chills Cardiac:[] ??Chest pain[] ??Chest pressure[] ??Palpitations [] ??Shortness of breath when laying flat [] ??Shortness of breath at rest [x] ??Shortness of breath with exertion. Vascular: [] ??Pain in legs with walking[] ??Pain in legsat rest[] ??Pain in legs when laying flat [] ??Claudication [] ??Pain in feet when walking [] ??Pain in feet at rest [] ??Pain in feet when laying flat [] ??History of DVT [] ??Phlebitis [x] ??Swelling in legs [] ??Varicose veins [] ??Non-healing ulcers Pulmonary: [] ??Uses home oxygen [] ??Productive cough[] ??Hemoptysis [] ??Wheeze [x] ??COPD [] ??Asthma Neurologic: [] ??Dizziness [] ??Blackouts [] ??Seizures [] ??History of stroke [x] ??History of TIA[] ??Aphasia [] ??Temporary  blindness[] ??Dysphagia [] ??Weaknessor numbness in arms [] ??Weakness or numbnessin legs Musculoskeletal: [x] ??Arthritis [] ??Joint swelling [] ??Joint pain [] ??Low back pain Hematologic:[] ??Easy bruising[] ??Easy bleeding [] ??Hypercoagulable state [] ??Anemic  Gastrointestinal:[] ??Blood in stool[] ??Vomiting blood[] ??Gastroesophageal reflux/heartburn[] ??Abdominal pain Genitourinary: [x] ??Chronic kidney disease [] ??Difficulturination [] ??Frequenturination [] ??Burning with urination[] ??Hematuria Skin: [] ??Rashes [] ??Ulcers [] ??Wounds Psychological: [] ??History of anxiety[] ??History of major depression.  Physical Examination  Resp 16   Wt 180 lb (81.6 kg)   BMI 30.90 kg/m  Gen:  WD/WN, NAD Head: [place]/AT, No temporalis wasting. Ear/Nose/Throat: Hearing grossly intact, nares w/o erythema or drainage Eyes: Conjunctiva clear. Sclera non-icteric Neck: Supple.  Trachea midline.  Right carotid bruit Pulmonary:  Good air movement, no use of accessory muscles.  Cardiac: RRR, no JVD Vascular:  Vessel Right Left  Radial Palpable Palpable       Musculoskeletal: M/S 5/5 throughout.  No deformity or atrophy.  Trace right lower extremity edema, 2+ left lower extremity edema. Neurologic: Sensation grossly intact in extremities.  Symmetrical.  Speech is fluent.  Psychiatric: Judgment intact, Mood & affect appropriate for pt's clinical situation. Dermatologic: No rashes or ulcers noted.  No cellulitis or open wounds.       Labs Recent Results (from the past 2160 hour(s))  [employer] Select 13 (MW), Urine     Status: None   Collection Time: 05/08/20  4:50 PM  Result Value Ref Range   Summary Note     Comment: ==================================================================== [employer] Select 13 (MW) ==================================================================== Test                             Result       Flag       Units  Drug Present  and Declared for Prescription Verification   Hydrocodone                    2497         EXPECTED   ng/mg creat   Hydromorphone                  468          EXPECTED   ng/mg creat   Dihydrocodeine                 311  EXPECTED   ng/mg creat   Norhydrocodone                 941          EXPECTED   ng/mg creat    Sources of hydrocodone include scheduled prescription medications.    Hydromorphone, dihydrocodeine and norhydrocodone are expected    metabolites of hydrocodone. Hydromorphone and dihydrocodeine are    also available as scheduled prescription medications.  ==================================================================== Test                      Result    Flag   Units      Ref Range   Creatinine              37                mg/dL      >=20 ==================================================================== Declared Medications:  The flagging and interpretation on this report are based on the  following declared medications.  Unexpected results may arise from  inaccuracies in the declared medications.   **Note: The testing scope of this panel includes these medications:   Hydrocodone   **Note: The testing scope of this panel does not include the  following reported medications:   Acetaminophen  Albuterol  Anastrozole (Arimidex)  Aspirin  Atorvastatin  Calcium  Carvedilol (Coreg)  Cyanocobalamin  Fluticasone (Flonase)  Furosemide  Gabapentin  Hydralazine  Levothyroxine  Mirtazapine (Remeron)  Pantoprazole  Potassium (Klor-Con)  Vitamin D ==================================================================== For clinical consultation, please call [phone]. ====================================================================     Radiology No results found.  Assessment/Plan Type 2 diabetes mellitus with vascular disease (HCC) blood glucose control important in reducing the progression of atherosclerotic disease. Also, involved in wound  healing. On appropriate medications.   Hyperlipidemia, unspecified lipid control important in reducing the progression of atherosclerotic disease. Continue statin therapy  History of AAA (abdominal aortic aneurysm) repair Aortobiiliac graft appears patent by duplex at last check  Renovascular hypertension, malignant Blood pressure control is good after left renal artery stent placement a few years ago  Carotid stenosis Carotid duplex today reveals 60 to 79% right ICA stenosis in 1-39 left ICA stenosis.  This has progressed a little on the right since her last study.  Continue current medical regimen including aspirin and statin agent.  No role for intervention at this level.  Recheck in 6 months.  Swelling of limb Seems to be a little worse.  We discussed the lymphedema pump to her compression socks and elevation.  She does not want to have this done at this time, but will consider it at her next visit.  Renal artery stenosis (HCC) Duplex today shows an atrophic right kidney measuring less than 6 cm without obvious renal artery stenosis.  The left renal artery velocities are normal without hemodynamically significant left renal artery stenosis.  Clinically doing well.  Continue current medical regimen.  Given her basically solitary left kidney with previous intervention, we will keep her on short interval follow-up 6 months.    Leotis Pain, MD  06/01/2020 12:34 PM    This note was created with Dragon medical transcription system.  Any errors from dictation are purely unintentional

## 2020-06-01 NOTE — Assessment & Plan Note (Signed)
Carotid duplex today reveals 60 to 79% right ICA stenosis in 1-39 left ICA stenosis.  This has progressed a little on the right since her last study.  Continue current medical regimen including aspirin and statin agent.  No role for intervention at this level.  Recheck in 6 months.

## 2020-07-31 ENCOUNTER — Encounter: Payer: Self-pay | Admitting: Student in an Organized Health Care Education/Training Program

## 2020-07-31 ENCOUNTER — Other Ambulatory Visit: Payer: Self-pay

## 2020-07-31 ENCOUNTER — Ambulatory Visit
Payer: Medicare HMO | Attending: Student in an Organized Health Care Education/Training Program | Admitting: Student in an Organized Health Care Education/Training Program

## 2020-07-31 DIAGNOSIS — M47816 Spondylosis without myelopathy or radiculopathy, lumbar region: Secondary | ICD-10-CM | POA: Insufficient documentation

## 2020-07-31 DIAGNOSIS — M5416 Radiculopathy, lumbar region: Secondary | ICD-10-CM | POA: Insufficient documentation

## 2020-07-31 DIAGNOSIS — G894 Chronic pain syndrome: Secondary | ICD-10-CM

## 2020-07-31 MED ORDER — TRAMADOL HCL ER 100 MG PO TB24: 100.0000 mg | ORAL_TABLET | Freq: Every day | ORAL | 1 refills | Status: DC

## 2020-07-31 MED ORDER — HYDROCODONE-ACETAMINOPHEN 5-325 MG PO TABS: 1.0000 | ORAL_TABLET | Freq: Two times a day (BID) | ORAL | 0 refills | Status: DC | PRN

## 2020-07-31 NOTE — Progress Notes (Signed)
PROVIDER NOTE: Information contained herein reflects review and annotations entered in association with encounter. Interpretation of such information and data should be left to medically-trained personnel. Information provided to patient can be located elsewhere in the medical record under "Patient Instructions". Document created using STT-dictation technology, any transcriptional errors that may result from process are unintentional.    Patient: Tamara Finley  Service Category: E/M  Provider: Gillis Santa, MD  DOB: July 10, 1941  DOS: 07/31/2020  Specialty: Interventional Pain Management  MRN: [id number]  Setting: Ambulatory outpatient  PCP: Ezequiel Kayser, MD  Type: Established Patient    Referring Provider: Ezequiel Kayser, MD  Location: Office  Delivery: Face-to-face     HPI  Tamara. Tamara Finley, a 79 y.o. year old female, is here today because of her No primary diagnosis found.. Tamara Finley primary complain today is Back Pain (lower) and Hip Pain (bilateral) Last encounter: My last encounter with her was on 05/01/2020. Pertinent problems: Tamara Finley has Low back pain radiating to both legs; Peripheral arterial disease ([place]); Right hip pain; Chronic SI joint pain; Lumbar facet arthropathy (L4/L5 and L5/S1); Controlled substance agreement signed; Lumbar spondylosis; and Lumbar radiculopathy on their pertinent problem list. Pain Assessment: Severity of Chronic pain is reported as a 8 /10. Location: Back Lower/right leg to the foot. Onset: More than a month ago. Quality: Aching, Dull. Timing: Constant. Modifying factor(s): lying down, Tylenol. Vitals:  height is _0  (1.626 m) and weight is 180 lb (81.6 kg). Her temporal temperature is 97.3 F (36.3 C) (abnormal). Her blood pressure is 175/72 (abnormal) and her pulse is 76. Her respiration is 16 and oxygen saturation is 98%.   Reason for encounter: medication management.  No significant changes in Tamara Finley's medical history. Overall pain is at baseline,  it utilizing hydrocodone BID prn but states that it only provides moderate pain relief for 3-4 hrs at best. Discussed other options which could include Tramadol 100 mg ER for long acting pain relief which would allow the patient to utilize Hydrocodone on a PRN basis. Patient would like to trial, risks discussed.  Pharmacotherapy Assessment   Analgesic: Hydrocodone 5 mg BID prn   Monitoring: [place] PMP: PDMP reviewed during this encounter.       Pharmacotherapy: No side-effects or adverse reactions reported. Compliance: No problems identified. Effectiveness: Clinically acceptable.  Landis Martins, RN  07/31/2020  8:34 AM  Sign when Signing Visit Nursing Pain Medication Assessment:  Safety precautions to be maintained throughout the outpatient stay will include: orient to surroundings, keep bed in low position, maintain call bell within reach at all times, provide assistance with transfer out of bed and ambulation.  Medication Inspection Compliance: Pill count conducted under aseptic conditions, in front of the patient. Neither the pills nor the bottle was removed from the patient's sight at any time. Once count was completed pills were immediately returned to the patient in their original bottle.  Medication: Hydrocodone/APAP Pill/Patch Count: 59 of 60 pills remain Pill/Patch Appearance: Markings consistent with prescribed medication Bottle Appearance: Standard pharmacy container. Clearly labeled. Filled Date: 07/23/20 Last Medication intake:  Today    UDS:  Summary  Date Value Ref Range Status  05/08/2020 Note  Final    Comment:    ==================================================================== [facility] (MW) ==================================================================== Test                             Result       Flag  Units  Drug Present and Declared for Prescription Verification   Hydrocodone                    2497         EXPECTED   ng/mg creat    Hydromorphone                  468          EXPECTED   ng/mg creat   Dihydrocodeine                 311          EXPECTED   ng/mg creat   Norhydrocodone                 941          EXPECTED   ng/mg creat    Sources of hydrocodone include scheduled prescription medications.    Hydromorphone, dihydrocodeine and norhydrocodone are expected    metabolites of hydrocodone. Hydromorphone and dihydrocodeine are    also available as scheduled prescription medications.  ==================================================================== Test                      Result    Flag   Units      Ref Range   Creatinine              37               mg/dL      >=20 ==================================================================== Declared Medications:  The flagging and interpretation on this report are based on the  following declared medications.  Unexpected results may arise from  inaccuracies in the declared medications.   **Note: The testing scope of this panel includes these medications:   Hydrocodone   **Note: The testing scope of this panel does not include the  following reported medications:   Acetaminophen  Albuterol  Anastrozole (Arimidex)  Aspirin  Atorvastatin  Calcium  Carvedilol (Coreg)  Cyanocobalamin  Fluticasone (Flonase)  Furosemide  Gabapentin  Hydralazine  Levothyroxine  Mirtazapine (Remeron)  Pantoprazole  Potassium (Klor-Con)  Vitamin D ==================================================================== For clinical consultation, please call [phone]. ====================================================================      ROS  Constitutional: Denies any fever or chills Gastrointestinal: No reported hemesis, hematochezia, vomiting, or acute GI distress Musculoskeletal: +lbp Neurological: No reported episodes of acute onset apraxia, aphasia, dysarthria, agnosia, amnesia, paralysis, loss of coordination, or loss of consciousness  Medication Review   Calcium 600+D Plus Minerals, HYDROcodone-acetaminophen, acetaminophen, albuterol, anastrozole, aspirin, atorvastatin, carvedilol, fluticasone, furosemide, gabapentin, hydrALAZINE, levothyroxine, mirtazapine, pantoprazole, potassium chloride SA, traMADol, and vitamin B-12  History Review  Allergy: Tamara Finley is allergic to benadryl [diphenhydramine hcl (sleep)] and alprazolam. Drug: Tamara Finley  reports no history of drug use. Alcohol:  reports no history of alcohol use. Tobacco:  reports that she quit smoking about 7 years ago. Her smoking use included cigarettes. She has a 100.00 pack-year smoking history. She has never used smokeless tobacco. Social: Tamara Finley  reports that she quit smoking about 7 years ago. Her smoking use included cigarettes. She has a 100.00 pack-year smoking history. She has never used smokeless tobacco. She reports that she does not drink alcohol and does not use drugs. Medical:  has a past medical history of Abdominal aortic aneurysm (AAA) ([place]) (2000's), Age related entropion, unspecified laterality, Anxiety, Anxiety and depression, Bilateral renal artery stenosis ([place]), Breast calcification, left, Cancer ([place]) (10/20/2016), Carotid artery thrombosis, bilateral, Carotid stenosis, Chronic  bronchitis ([place]), Chronic insomnia, COPD (chronic obstructive pulmonary disease) ([place]), Coronary artery disease, CRI (chronic renal insufficiency), stage 3 (moderate) ([place]), Depression, Diabetes mellitus without complication ([place]), GERD (gastroesophageal reflux disease), Heart palpitations, History of AAA (abdominal aortic aneurysm) repair, History of colon polyps, History of TIA (transient ischemic attack), Hypercholesterolemia, Hypertension, Hypothyroidism, Migraine headache, Osteoarthritis, Osteoporosis, Peripheral arterial disease ([place]), Pulmonary scarring, Renal disorder, SOB (shortness of breath), 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 Surgical: Tamara Finley  has a past surgical history that includes Cardiac surgery; Abdominal aortic aneurysm repair; Cardiac catheterization (N/A, 12/05/2015); Cardiac catheterization (Left, 12/31/2015); Breast excisional biopsy (Left, 2010); Breast excisional biopsy (Left, 07/01/2006); Breast biopsy (Left, 09/03/2016); Breast biopsy (Left, 09/25/2016); Bronchoscopy; CAROTID PTA/STENT INTERVENTION; Colonoscopy; Eye surgery; Laminectomy (07/25/2013); Back surgery; Mediastinoscopy; Mastectomy; Thoracoscopy (12/16/2016); and Colonoscopy with propofol (N/A, 06/15/2017). Family: family history includes CVA in her mother; Cancer in her brother; Coronary artery disease in her father; Dementia in her father; Diabetes in her brother; Epilepsy in her mother; Hypertension in her father and mother; Ovarian cancer in her sister; Stroke in her father and mother.  Laboratory Chemistry Profile   Renal Lab Results  Component Value Date   BUN 9 01/01/2016   CREATININE 1.03 (H) 01/01/2016   GFRAA >60 01/01/2016   GFRNONAA 53 (L) 01/01/2016     Hepatic Lab Results  Component Value Date   AST 28 10/17/2015   ALT 17 10/17/2015   ALBUMIN 3.7 10/17/2015   ALKPHOS 64 10/17/2015     Electrolytes Lab Results  Component Value Date   NA 139 01/01/2016   K 3.5 01/01/2016   CL 106 01/01/2016   CALCIUM 8.2 (L) 01/01/2016     Bone No results found for: VD25OH, [id number], UK0254YH0, WC3762GB1, 25OHVITD1, 25OHVITD2, 25OHVITD3, TESTOFREE, TESTOSTERONE   Inflammation (CRP: Acute Phase) (ESR: Chronic Phase) Lab Results  Component Value Date   ESRSEDRATE 23 10/12/2015       Note: Above Lab results reviewed.  Recent Imaging Review  VAS US CAROTID Carotid Arterial Duplex Study  Comparison Study:  04/15/2019  Performing Technologist: Charlane Ferretti RT (R)(VS)    Examination Guidelines: A complete evaluation includes B-mode imaging,  spectral Doppler, color Doppler, and power Doppler as needed of all accessible portions of each vessel. Bilateral testing is considered an integral part of a complete examination. Limited examinations for reoccurring indications may be performed as noted.    Right Carotid Findings: +----------+--------+--------+--------+------------------+-------------------+           PSV cm/sEDV cm/sStenosisPlaque DescriptionComments            +----------+--------+--------+--------+------------------+-------------------+ CCA Prox  86      11              calcific          intimal thickening  +----------+--------+--------+--------+------------------+-------------------+ CCA Mid   103     19              calcific          intimal thickening  +----------+--------+--------+--------+------------------+-------------------+ CCA Distal146     31              calcific          intimal thickening  +----------+--------+--------+--------+------------------+-------------------+ ICA Prox  263     9               calcific                              +----------+--------+--------+--------+------------------+-------------------+  ICA Mid   166     41                                ICA/CCA ratio = 2.6 +----------+--------+--------+--------+------------------+-------------------+ ICA Distal132     27                                                    +----------+--------+--------+--------+------------------+-------------------+ ECA       224     5                                                     +----------+--------+--------+--------+------------------+-------------------+  +----------+--------+-------+-----------+-------------------+           PSV cm/sEDV cmsDescribe   Arm Pressure (mmHG) +----------+--------+-------+-----------+-------------------+ [id number]             Multiphasic                     +----------+--------+-------+-----------+-------------------+  +---------+--------+---+--------+--+---------+ VertebralPSV cm/s158EDV cm/s36Antegrade +---------+--------+---+--------+--+---------+  Left Carotid Findings: +----------+--------+--------+--------+------------------+--------------------+           PSV cm/sEDV cm/sStenosisPlaque DescriptionComments             +----------+--------+--------+--------+------------------+--------------------+ CCA Prox  64      13              calcific          intimal thickening   +----------+--------+--------+--------+------------------+--------------------+ CCA Mid   56      15              calcific          intimal thickening   +----------+--------+--------+--------+------------------+--------------------+ CCA Distal78      20                                Stent                +----------+--------+--------+--------+------------------+--------------------+ ICA Prox  109     25                                Stent                +----------+--------+--------+--------+------------------+--------------------+ ICA Mid   88      26                                ICA/CCA ratio = 1.70 +----------+--------+--------+--------+------------------+--------------------+ ICA Distal98      27                                                     +----------+--------+--------+--------+------------------+--------------------+ ECA       501     64              calcific                               +----------+--------+--------+--------+------------------+--------------------+  +----------+--------+--------+-----------+-------------------+  PSV cm/sEDV cm/sDescribe   Arm Pressure (mmHG) +----------+--------+--------+-----------+-------------------+ Subclavian213             Multiphasic                     +----------+--------+--------+-----------+-------------------+  +---------+--------+--+--------+--+---------+ VertebralPSV cm/s77EDV cm/s15Antegrade +---------+--------+--+--------+--+---------+  Summary: Right Carotid: Velocities in the right ICA are consistent with a 60-79%                stenosis. The ECA appears >50% stenosed.  Left Carotid: Velocities in the left ICA are consistent with a 1-39% stenosis.               The ECA appears >50% stenosed. Left ICA velocities have decreased               as compared to the previous exam on 04/15/2019.  Vertebrals:  Bilateral vertebral arteries demonstrate antegrade flow. Subclavians: Normal flow hemodynamics were seen in bilateral subclavian              arteries.  *See table(s) above for measurements and observations.    Electronically signed by Leotis Pain MD on 06/01/2020 at 12:39:58 PM.      Final   VAS US RENAL ARTERY DUPLEX ABDOMINAL VISCERAL  Limitations: Air/bowel gas, obesity and Very limited exam due to bowel gas. Comparison Study: 04/15/2019  Performing Technologist: Charlane Ferretti RT (R)(VS)    Examination Guidelines: A complete evaluation includes B-mode imaging, spectral Doppler, color Doppler, and power Doppler as needed of all accessible portions of each vessel. Bilateral testing is considered an integral part of a complete examination. Limited examinations for reoccurring indications may be performed as noted.    Duplex Findings: +----------+--------+--------+------+--------+ MesentericPSV cm/sEDV cm/sPlaqueComments +----------+--------+--------+------+--------+ Aorta Mid    77                          +----------+--------+--------+------+--------+    +------------------+--------+--------+-------+ Right Renal ArteryPSV cm/sEDV cm/sComment +------------------+--------+--------+-------+ Proximal             47                    +------------------+--------+--------+-------+ Mid                  39                   +------------------+--------+--------+-------+ Distal               57                   +------------------+--------+--------+-------+  +-----------------+--------+--------+-------+ Left Renal ArteryPSV cm/sEDV cm/sComment +-----------------+--------+--------+-------+ Proximal            93                   +-----------------+--------+--------+-------+ Mid                 44                   +-----------------+--------+--------+-------+ Distal              58                   +-----------------+--------+--------+-------+    +------------------+----+------------------+-----+ Right Kidney          Left Kidney             +------------------+----+------------------+-----+ RAR (manual)      .74 RAR (manual)  1.21  +------------------+----+------------------+-----+ Kidney length (cm)5.70Kidney length (cm)11.40 +------------------+----+------------------+-----+    Summary: Renal:   Right: Abnormal size for the right kidney. RRV flow present. No        evidence of right renal artery stenosis. Left:  LRV flow present. Normal size of left kidney. No evidence of        left renal artery stenosis. Cyst(s) noted. Left mid/lateral        cyst measuring approximately 1.67cm x 1.55cm x 1.68cm and        appears to be slightly smaller than on previous exam on        04/15/2019.   *See table(s) above for measurements and observations.   Diagnosing physician: Leotis Pain MD   Electronically signed by Leotis Pain MD on 06/01/2020 at 12:39:52 PM.       Final   Note: Reviewed        Physical Exam  General appearance: Well nourished, well developed, and well hydrated. In no apparent acute distress Mental status: Alert, oriented x 3 (person, place, & time)       Respiratory: No evidence of acute respiratory distress Eyes: PERLA Vitals: BP (!) 175/72    Pulse 76   Temp (!) 97.3 F (36.3 C) (Temporal)   Resp 16   Ht _0  (1.626 m)   Wt 180 lb (81.6 kg)   SpO2 98%   BMI 30.90 kg/m  BMI: Estimated body mass index is 30.9 kg/m as calculated from the following:   Height as of this encounter: _1  (1.626 m).   Weight as of this encounter: 180 lb (81.6 kg). Ideal: Ideal body weight: 54.7 kg (120 lb 9.5 oz) Adjusted ideal body weight: 65.5 kg (144 lb 5.7 oz)  Lumbar Spine Area Exam  Skin & Axial Inspection: No masses, redness, or swelling Alignment: Symmetrical Functional ROM: Unrestricted ROM       Stability: No instability detected Muscle Tone/Strength: Functionally intact. No obvious neuro-muscular anomalies detected. Sensory (Neurological): Unimpaired Palpation: No palpable anomalies       Provocative Tests: Hyperextension/rotation test: deferred today       Lumbar quadrant test (Kemp's test): deferred today       Lateral bending test: deferred today       Patrick's Maneuver: deferred today                   FABER* test: deferred today                   S-I anterior distraction/compression test: deferred today         S-I lateral compression test: deferred today         S-I Thigh-thrust test: deferred today         S-I Gaenslen's test: deferred today         *(Flexion, ABduction and External Rotation)  Assessment   Status Diagnosis  Controlled Controlled Controlled 1. Lumbar radiculopathy   2. Lumbar facet arthropathy (L4/L5 and L5/S1)   3. Chronic pain syndrome       Plan of Care   Tamara Finley has a current medication list which includes the following long-term medication(s): albuterol, calcium 600+d plus minerals, carvedilol, furosemide, gabapentin, levothyroxine, mirtazapine, pantoprazole, and gabapentin.  Pharmacotherapy (Medications Ordered): Meds ordered this encounter  Medications  . HYDROcodone-acetaminophen (NORCO/VICODIN) 5-325 MG tablet    Sig: Take 1 tablet by mouth 2 (two) times daily as  needed for moderate pain.    Dispense:  60  tablet    Refill:  0  . traMADol (ULTRAM-ER) 100 MG 24 hr tablet    Sig: Take 1 tablet (100 mg total) by mouth daily.    Dispense:  30 tablet    Refill:  1    Follow-up plan:   Return in about 7 weeks (around 09/18/2020) for Medication Management, in person.     consider RFA in future, Sprint PNS of medial branch at L4       Recent Visits No visits were found meeting these conditions. Showing recent visits within past 90 days and meeting all other requirements Today's Visits Date Type Provider Dept  07/31/20 Office Visit Gillis Santa, MD [facility]  Showing today's visits and meeting all other requirements Future Appointments Date Type Provider Dept  09/11/20 Appointment Gillis Santa, MD [facility]  Showing future appointments within next 90 days and meeting all other requirements  I discussed the assessment and treatment plan with the patient. The patient was provided an opportunity to ask questions and all were answered. The patient agreed with the plan and demonstrated an understanding of the instructions.  Patient advised to call back or seek an in-person evaluation if the symptoms or condition worsens.  Duration of encounter: 30 minutes.  Note by: Gillis Santa, MD Date: 07/31/2020; Time: 9:09 AM

## 2020-07-31 NOTE — Progress Notes (Signed)
Nursing Pain Medication Assessment:  Safety precautions to be maintained throughout the outpatient stay will include: orient to surroundings, keep bed in low position, maintain call bell within reach at all times, provide assistance with transfer out of bed and ambulation.  Medication Inspection Compliance: Pill count conducted under aseptic conditions, in front of the patient. Neither the pills nor the bottle was removed from the patient's sight at any time. Once count was completed pills were immediately returned to the patient in their original bottle.  Medication: Hydrocodone/APAP Pill/Patch Count: 59 of 60 pills remain Pill/Patch Appearance: Markings consistent with prescribed medication Bottle Appearance: Standard pharmacy container. Clearly labeled. Filled Date: 07/23/20 Last Medication intake:  Today

## 2020-08-01 ENCOUNTER — Telehealth: Payer: Self-pay | Admitting: Student in an Organized Health Care Education/Training Program

## 2020-08-01 NOTE — Telephone Encounter (Signed)
Called the number given . No answer, voice mailbox full. This medication does NOT need a prior auth. If she calls back tell her the $33 copay is what the patient owes out of pocket per her insurance company.

## 2020-08-01 NOTE — Telephone Encounter (Signed)
Patients husband went to pick up Tramadol and it is going to cost them $80. It may need PA please check on this with the pharmacy ?

## 2020-08-02 ENCOUNTER — Telehealth: Payer: Self-pay | Admitting: Student in an Organized Health Care Education/Training Program

## 2020-08-02 NOTE — Telephone Encounter (Signed)
Patient is unable to pay $80 for tramadol. Is there anything else this can be changed to for her pain?

## 2020-08-02 NOTE — Telephone Encounter (Signed)
I do not have another long-acting opioid analgesic that she can trial.

## 2020-08-02 NOTE — Telephone Encounter (Signed)
Spoke with Kieth Brightly, informed her.

## 2020-09-04 DIAGNOSIS — N1831 Chronic kidney disease, stage 3a: Secondary | ICD-10-CM | POA: Diagnosis not present

## 2020-09-04 DIAGNOSIS — R809 Proteinuria, unspecified: Secondary | ICD-10-CM | POA: Diagnosis not present

## 2020-09-04 DIAGNOSIS — R600 Localized edema: Secondary | ICD-10-CM | POA: Diagnosis not present

## 2020-09-04 DIAGNOSIS — I701 Atherosclerosis of renal artery: Secondary | ICD-10-CM | POA: Diagnosis not present

## 2020-09-04 DIAGNOSIS — D631 Anemia in chronic kidney disease: Secondary | ICD-10-CM | POA: Diagnosis not present

## 2020-09-11 ENCOUNTER — Other Ambulatory Visit: Payer: Self-pay

## 2020-09-11 ENCOUNTER — Encounter: Payer: Self-pay | Admitting: Student in an Organized Health Care Education/Training Program

## 2020-09-11 ENCOUNTER — Ambulatory Visit
Payer: Medicare HMO | Attending: Student in an Organized Health Care Education/Training Program | Admitting: Student in an Organized Health Care Education/Training Program

## 2020-09-11 VITALS — BP 162/78 | HR 74 | Temp 97.3°F | Resp 16 | Ht 64.0 in | Wt 176.0 lb

## 2020-09-11 DIAGNOSIS — M47816 Spondylosis without myelopathy or radiculopathy, lumbar region: Secondary | ICD-10-CM

## 2020-09-11 DIAGNOSIS — M5416 Radiculopathy, lumbar region: Secondary | ICD-10-CM

## 2020-09-11 DIAGNOSIS — M5136 Other intervertebral disc degeneration, lumbar region: Secondary | ICD-10-CM | POA: Diagnosis not present

## 2020-09-11 DIAGNOSIS — G894 Chronic pain syndrome: Secondary | ICD-10-CM

## 2020-09-11 DIAGNOSIS — M51369 Other intervertebral disc degeneration, lumbar region without mention of lumbar back pain or lower extremity pain: Secondary | ICD-10-CM

## 2020-09-11 MED ORDER — HYDROCODONE-ACETAMINOPHEN 5-325 MG PO TABS: 1.0000 | ORAL_TABLET | Freq: Two times a day (BID) | ORAL | 0 refills | Status: DC | PRN

## 2020-09-11 MED ORDER — HYDROCODONE-ACETAMINOPHEN 5-325 MG PO TABS: 1.0000 | ORAL_TABLET | Freq: Two times a day (BID) | ORAL | 0 refills | Status: AC | PRN

## 2020-09-11 NOTE — Progress Notes (Signed)
PROVIDER NOTE: Information contained herein reflects review and annotations entered in association with encounter. Interpretation of such information and data should be left to medically-trained personnel. Information provided to patient can be located elsewhere in the medical record under "Patient Instructions". Document created using STT-dictation technology, any transcriptional errors that may result from process are unintentional.    Patient: Tamara Finley  Service Category: E/M  Provider: Gillis Santa, MD  DOB: 1941/02/12  DOS: 09/11/2020  Specialty: Interventional Pain Management  MRN: [id number]  Setting: Ambulatory outpatient  PCP: Ezequiel Kayser, MD  Type: Established Patient    Referring Provider: Ezequiel Kayser, MD  Location: Office  Delivery: Face-to-face     HPI  Tamara Finley, a 80 y.o. year old female, is here today because of her Lumbar radiculopathy [M54.16]. Tamara Finley primary complain today is bilateral low back pain, leg pain  Last encounter: My last encounter with her was on 08/02/2020. Pertinent problems: Tamara Finley has Low back pain radiating to both legs; Peripheral arterial disease ([place]); Right hip pain; Chronic SI joint pain; Lumbar facet arthropathy (L4/L5 and L5/S1); Controlled substance agreement signed; Lumbar spondylosis; and Lumbar radiculopathy on their pertinent problem list. Pain Assessment: Severity of Chronic pain is reported as a 8 /10. Location: Back Lower/both hips and legs. Onset: More than a month ago. Quality: Aching,Dull. Timing: Constant. Modifying factor(s): medications, rest. Vitals:  height is 5' 4"  (1.626 m) and weight is 176 lb (79.8 kg). Her temporal temperature is 97.3 F (36.3 C) (abnormal). Her blood pressure is 162/78 (abnormal) and her pulse is 74. Her respiration is 16 and oxygen saturation is 97%.   Reason for encounter: medication management.    Patient presents today for follow-up.  At her last visit with me, she was started on extended  release tramadol 100 mg.  She does not endorse any significant pain relief with the addition of tramadol on top of her hydrocodone.  She prefers to continue hydrocodone at 5 mg twice a day.  She does have occasional constipation we discussed a regular bowel regimen with [facility] if she goes more than 2 days without having a bowel movement to utilize MiraLAX.  She is also requesting an injection.  She has had low back pain with radiation into bilateral lower extremities in a posterior lateral distribution.  She has a history of a L3 compression fracture and L3 laminectomy.  Her previous caudal epidural steroid injection with me was in 2019.  Pharmacotherapy Assessment   Analgesic: Hydrocodone 5 mg BID prn   Monitoring:  PMP: PDMP reviewed during this encounter.       Pharmacotherapy: No side-effects or adverse reactions reported. Compliance: No problems identified. Effectiveness: Clinically acceptable.  Landis Martins, RN  09/11/2020  8:50 AM  Sign when Signing Visit Nursing Pain Medication Assessment:  Safety precautions to be maintained throughout the outpatient stay will include: orient to surroundings, keep bed in low position, maintain call bell within reach at all times, provide assistance with transfer out of bed and ambulation.  Medication Inspection Compliance: Pill count conducted under aseptic conditions, in front of the patient. Neither the pills nor the bottle was removed from the patient's sight at any time. Once count was completed pills were immediately returned to the patient in their original bottle.  Medication #1: Tramadol (Ultram) Pill/Patch Count: 0 of 30 pills remain Pill/Patch Appearance: Markings consistent with prescribed medication Bottle Appearance: Standard pharmacy container. Clearly labeled. Filled Date:07/31/2020 Last Medication intake:  Ran out of medicine  more than 48 hours ago  Medication #2: Hydrocodone/APAP Pill/Patch Count: 25 of 60 pills  remain Pill/Patch Appearance: Markings consistent with prescribed medication Bottle Appearance: Standard pharmacy container. Clearly labeled. Filled Date:07/23/2020 Last Medication intake:  Yesterday    UDS:  Summary  Date Value Ref Range Status  05/08/2020 Note  Final    Comment:    ==================================================================== [employer] (MW) ==================================================================== Test                             Result       Flag       Units  Drug Present and Declared for Prescription Verification   Hydrocodone                    2497         EXPECTED   ng/mg creat   Hydromorphone                  468          EXPECTED   ng/mg creat   Dihydrocodeine                 311          EXPECTED   ng/mg creat   Norhydrocodone                 941          EXPECTED   ng/mg creat    Sources of hydrocodone include scheduled prescription medications.    Hydromorphone, dihydrocodeine and norhydrocodone are expected    metabolites of hydrocodone. Hydromorphone and dihydrocodeine are    also available as scheduled prescription medications.  ==================================================================== Test                      Result    Flag   Units      Ref Range   Creatinine              37               mg/dL      >=20 ==================================================================== Declared Medications:  The flagging and interpretation on this report are based on the  following declared medications.  Unexpected results may arise from  inaccuracies in the declared medications.   **Note: The testing scope of this panel includes these medications:   Hydrocodone   **Note: The testing scope of this panel does not include the  following reported medications:   Acetaminophen  Albuterol  Anastrozole (Arimidex)  Aspirin  Atorvastatin  Calcium  Carvedilol (Coreg)  Cyanocobalamin  Fluticasone (Flonase)   Furosemide  Gabapentin  Hydralazine  Levothyroxine  Mirtazapine (Remeron)  Pantoprazole  Potassium (Klor-Con)  Vitamin D ==================================================================== For clinical consultation, please call [phone]. ====================================================================      ROS  Constitutional: Denies any fever or chills Gastrointestinal: No reported hemesis, hematochezia, vomiting, or acute GI distress Musculoskeletal: Bilateral low back, bilateral leg pain Neurological: No reported episodes of acute onset apraxia, aphasia, dysarthria, agnosia, amnesia, paralysis, loss of coordination, or loss of consciousness  Medication Review  Calcium 600+D Plus Minerals, HYDROcodone-acetaminophen, acetaminophen, albuterol, anastrozole, aspirin, atorvastatin, carvedilol, fluticasone, furosemide, gabapentin, hydrALAZINE, levothyroxine, mirtazapine, montelukast, pantoprazole, potassium chloride SA, traMADol, and vitamin B-12  History Review  Allergy: Tamara Finley is allergic to benadryl [diphenhydramine hcl (sleep)] and alprazolam. Drug: Tamara Finley  reports no history of drug use. Alcohol:  reports no history of  alcohol use. Tobacco:  reports that she quit smoking about 7 years ago. Her smoking use included cigarettes. She has a 100.00 pack-year smoking history. She has never used smokeless tobacco. Social: Tamara Finley  reports that she quit smoking about 7 years ago. Her smoking use included cigarettes. She has a 100.00 pack-year smoking history. She has never used smokeless tobacco. She reports that she does not drink alcohol and does not use drugs. Medical:  has a past medical history of Abdominal aortic aneurysm (AAA) ([place]) (2000's), Age related entropion, unspecified laterality, Anxiety, Anxiety and depression, Bilateral renal artery stenosis ([place]), Breast calcification, left, Cancer ([place]) (10/20/2016), Carotid artery thrombosis, bilateral, Carotid  stenosis, Chronic bronchitis ([facility]), Chronic insomnia, COPD (chronic obstructive pulmonary disease) ([place]), Coronary artery disease, CRI (chronic renal insufficiency), stage 3 (moderate) ([place]), Depression, Diabetes mellitus without complication ([place]), GERD (gastroesophageal reflux disease), Heart palpitations, History of AAA (abdominal aortic aneurysm) repair, History of colon polyps, History of TIA (transient ischemic attack), Hypercholesterolemia, Hypertension, Hypothyroidism, Migraine headache, Osteoarthritis, Osteoporosis, Peripheral arterial disease ([place]), Pulmonary scarring, Renal disorder, SOB (shortness of breath), 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 Surgical: Tamara Finley  has a past surgical history that includes Cardiac surgery; Abdominal aortic aneurysm repair; Cardiac catheterization (N/A, 12/05/2015); Cardiac catheterization (Left, 12/31/2015); Breast excisional biopsy (Left, 2010); Breast excisional biopsy (Left, 07/01/2006); Breast biopsy (Left, 09/03/2016); Breast biopsy (Left, 09/25/2016); Bronchoscopy; CAROTID PTA/STENT INTERVENTION; Colonoscopy; Eye surgery; Laminectomy (07/25/2013); Back surgery; Mediastinoscopy; Mastectomy; Thoracoscopy (12/16/2016); and Colonoscopy with propofol (N/A, 06/15/2017). Family: family history includes CVA in her mother; Cancer in her brother; Coronary artery disease in her father; Dementia in her father; Diabetes in her brother; Epilepsy in her mother; Hypertension in her father and mother; Ovarian cancer in her sister; Stroke in her father and mother.  Laboratory Chemistry Profile   Renal Lab Results  Component Value Date   BUN 9 01/01/2016   CREATININE 1.03 (H) 01/01/2016   GFRAA >60 01/01/2016   GFRNONAA 53 (L) 01/01/2016     Hepatic Lab Results  Component Value Date   AST 28 10/17/2015   ALT 17 10/17/2015   ALBUMIN 3.7  10/17/2015   ALKPHOS 64 10/17/2015     Electrolytes Lab Results  Component Value Date   NA 139 01/01/2016   K 3.5 01/01/2016   CL 106 01/01/2016   CALCIUM 8.2 (L) 01/01/2016     Bone No results found for: VD25OH, IF027XA1OIN, OM7672CN4, BS9628ZM6, 25OHVITD1, 25OHVITD2, 25OHVITD3, TESTOFREE, TESTOSTERONE   Inflammation (CRP: Acute Phase) (ESR: Chronic Phase) Lab Results  Component Value Date   ESRSEDRATE 23 10/12/2015       Note: Above Lab results reviewed.  Recent Imaging Review  VAS US CAROTID Carotid Arterial Duplex Study  Comparison Study:  04/15/2019  Performing Technologist: Charlane Ferretti RT (R)(VS)    Examination Guidelines: A complete evaluation includes B-mode imaging, spectral Doppler, color Doppler, and power Doppler as needed of all accessible portions of each vessel. Bilateral testing is considered an integral part of a complete examination. Limited examinations for reoccurring indications may be performed as noted.    Right Carotid Findings: +----------+--------+--------+--------+------------------+-------------------+           PSV cm/sEDV cm/sStenosisPlaque DescriptionComments            +----------+--------+--------+--------+------------------+-------------------+ CCA Prox  86      11              calcific  intimal thickening  +----------+--------+--------+--------+------------------+-------------------+ CCA Mid   103     19              calcific          intimal thickening  +----------+--------+--------+--------+------------------+-------------------+ CCA Distal146     31              calcific          intimal thickening  +----------+--------+--------+--------+------------------+-------------------+ ICA Prox  263     9               calcific                              +----------+--------+--------+--------+------------------+-------------------+ ICA Mid   166     41                                 ICA/CCA ratio = 2.6 +----------+--------+--------+--------+------------------+-------------------+ ICA Distal132     27                                                    +----------+--------+--------+--------+------------------+-------------------+ ECA       224     5                                                     +----------+--------+--------+--------+------------------+-------------------+  +----------+--------+-------+-----------+-------------------+           PSV cm/sEDV cmsDescribe   Arm Pressure (mmHG) +----------+--------+-------+-----------+-------------------+ [id number]             Multiphasic                    +----------+--------+-------+-----------+-------------------+  +---------+--------+---+--------+--+---------+ VertebralPSV cm/s158EDV cm/s36Antegrade +---------+--------+---+--------+--+---------+  Left Carotid Findings: +----------+--------+--------+--------+------------------+--------------------+           PSV cm/sEDV cm/sStenosisPlaque DescriptionComments             +----------+--------+--------+--------+------------------+--------------------+ CCA Prox  64      13              calcific          intimal thickening   +----------+--------+--------+--------+------------------+--------------------+ CCA Mid   56      15              calcific          intimal thickening   +----------+--------+--------+--------+------------------+--------------------+ CCA Distal78      20                                Stent                +----------+--------+--------+--------+------------------+--------------------+ ICA Prox  109     25                                Stent                +----------+--------+--------+--------+------------------+--------------------+ ICA Mid   88      26  ICA/CCA ratio =  1.70 +----------+--------+--------+--------+------------------+--------------------+ ICA Distal98      27                                                     +----------+--------+--------+--------+------------------+--------------------+ ECA       501     64              calcific                               +----------+--------+--------+--------+------------------+--------------------+  +----------+--------+--------+-----------+-------------------+           PSV cm/sEDV cm/sDescribe   Arm Pressure (mmHG) +----------+--------+--------+-----------+-------------------+ Subclavian213             Multiphasic                    +----------+--------+--------+-----------+-------------------+  +---------+--------+--+--------+--+---------+ VertebralPSV cm/s77EDV cm/s15Antegrade +---------+--------+--+--------+--+---------+  Summary: Right Carotid: Velocities in the right ICA are consistent with a 60-79%                stenosis. The ECA appears >50% stenosed.  Left Carotid: Velocities in the left ICA are consistent with a 1-39% stenosis.               The ECA appears >50% stenosed. Left ICA velocities have decreased               as compared to the previous exam on 04/15/2019.  Vertebrals:  Bilateral vertebral arteries demonstrate antegrade flow. Subclavians: Normal flow hemodynamics were seen in bilateral subclavian              arteries.  *See table(s) above for measurements and observations.    Electronically signed by Leotis Pain MD on 06/01/2020 at 12:39:58 PM.      Final   VAS US RENAL ARTERY DUPLEX ABDOMINAL VISCERAL  Limitations: Air/bowel gas, obesity and Very limited exam due to bowel gas. Comparison Study: 04/15/2019  Performing Technologist: Charlane Ferretti RT (R)(VS)    Examination Guidelines: A complete evaluation includes B-mode imaging, spectral Doppler, color Doppler, and power Doppler as needed of all accessible portions of each  vessel. Bilateral testing is considered an integral part of a complete examination. Limited examinations for reoccurring indications may be performed as noted.    Duplex Findings: +----------+--------+--------+------+--------+ MesentericPSV cm/sEDV cm/sPlaqueComments +----------+--------+--------+------+--------+ Aorta Mid    77                          +----------+--------+--------+------+--------+    +------------------+--------+--------+-------+ Right Renal ArteryPSV cm/sEDV cm/sComment +------------------+--------+--------+-------+ Proximal             47                   +------------------+--------+--------+-------+ Mid                  39                   +------------------+--------+--------+-------+ Distal               57                   +------------------+--------+--------+-------+  +-----------------+--------+--------+-------+ Left Renal ArteryPSV cm/sEDV cm/sComment +-----------------+--------+--------+-------+ Proximal  93                   +-----------------+--------+--------+-------+ Mid                 44                   +-----------------+--------+--------+-------+ Distal              58                   +-----------------+--------+--------+-------+    +------------------+----+------------------+-----+ Right Kidney          Left Kidney             +------------------+----+------------------+-----+ RAR (manual)      .74 RAR (manual)      1.21  +------------------+----+------------------+-----+ Kidney length (cm)5.70Kidney length (cm)11.40 +------------------+----+------------------+-----+    Summary: Renal:   Right: Abnormal size for the right kidney. RRV flow present. No        evidence of right renal artery stenosis. Left:  LRV flow present. Normal size of left kidney. No evidence of        left renal artery stenosis. Cyst(s) noted. Left mid/lateral        cyst  measuring approximately 1.67cm x 1.55cm x 1.68cm and        appears to be slightly smaller than on previous exam on        04/15/2019.   *See table(s) above for measurements and observations.   Diagnosing physician: Leotis Pain MD   Electronically signed by Leotis Pain MD on 06/01/2020 at 12:39:52 PM.       Final   Note: Reviewed        Physical Exam  General appearance: Well nourished, well developed, and well hydrated. In no apparent acute distress Mental status: Alert, oriented x 3 (person, place, & time)       Respiratory: No evidence of acute respiratory distress Eyes: PERLA Vitals: BP (!) 162/78   Pulse 74   Temp (!) 97.3 F (36.3 C) (Temporal)   Resp 16   Ht 5' 4"  (1.626 m)   Wt 176 lb (79.8 kg)   SpO2 97%   BMI 30.21 kg/m  BMI: Estimated body mass index is 30.21 kg/m as calculated from the following:   Height as of this encounter: 5' 4"  (1.626 m).   Weight as of this encounter: 176 lb (79.8 kg). Ideal: Ideal body weight: 54.7 kg (120 lb 9.5 oz) Adjusted ideal body weight: 64.8 kg (142 lb 12.1 oz)   Lumbar Spine Area Exam  Skin & Axial Inspection: Well healed scar from previous spine surgery detected Alignment: Symmetrical Functional ROM: Decreased ROM       Stability: No instability detected Muscle Tone/Strength: Functionally intact. No obvious neuro-muscular anomalies detected. Sensory (Neurological): Dermatomal pain pattern  Gait & Posture Assessment  Ambulation: Patient came in today in a wheel chair Gait: Antalgic gait (limping) Posture: Difficulty standing up straight, due to pain  Lower Extremity Exam    Side: Right lower extremity  Side: Left lower extremity  Stability: No instability observed          Stability: No instability observed          Skin & Extremity Inspection: Skin color, temperature, and hair growth are WNL. No peripheral edema or cyanosis. No masses, redness, swelling, asymmetry, or associated skin lesions. No contractures.  Skin &  Extremity Inspection: Skin color, temperature, and hair growth are WNL. No peripheral edema or  cyanosis. No masses, redness, swelling, asymmetry, or associated skin lesions. No contractures.  Functional ROM: Pain restricted ROM for all joints of the lower extremity Limited SLR (straight leg raise)  Functional ROM: Pain restricted ROM for all joints of the lower extremity Limited SLR (straight leg raise)  Muscle Tone/Strength: Functionally intact. No obvious neuro-muscular anomalies detected.  Muscle Tone/Strength: Functionally intact. No obvious neuro-muscular anomalies detected.  Sensory (Neurological): Dermatomal pain pattern        Sensory (Neurological): Dermatomal pain pattern        DTR: Patellar: deferred today Achilles: deferred today Plantar: deferred today  DTR: Patellar: deferred today Achilles: deferred today Plantar: deferred today  Palpation: No palpable anomalies  Palpation: No palpable anomalies    Assessment   Status Diagnosis  Worsening Persistent Persistent 1. Lumbar radiculopathy   2. Lumbar facet arthropathy (L4/L5 and L5/S1)   3. Chronic pain syndrome   4. Lumbar spondylosis   5. Lumbar degenerative disc disease        Plan of Care  Tamara Finley has a current medication list which includes the following long-term medication(s): albuterol, calcium 600+d plus minerals, carvedilol, furosemide, gabapentin, gabapentin, levothyroxine, mirtazapine, montelukast, and pantoprazole.  Pharmacotherapy (Medications Ordered): Meds ordered this encounter  Medications  . HYDROcodone-acetaminophen (NORCO/VICODIN) 5-325 MG tablet    Sig: Take 1 tablet by mouth 2 (two) times daily as needed for moderate pain.    Dispense:  60 tablet    Refill:  0  . HYDROcodone-acetaminophen (NORCO/VICODIN) 5-325 MG tablet    Sig: Take 1 tablet by mouth 2 (two) times daily as needed for moderate pain.    Dispense:  60 tablet    Refill:  0   Orders:  Orders Placed This Encounter   Procedures  . Caudal Epidural Injection    Standing Status:   Future    Standing Expiration Date:   10/12/2020    Scheduling Instructions:     Laterality: RIGHT     Level(s): Sacrococcygeal canal (Tailbone area)     Sedation: without     Scheduling Timeframe: As soon as pre-approved    Order Specific Question:   Where will this procedure be performed?    Answer:   [facility]   Follow-up plan:   Return in about 1 week (around 09/18/2020) for caudal injection (right) , without sedation.     consider RFA in future, Sprint PNS of medial branch at L4        Recent Visits Date Type Provider Dept  07/31/20 Office Visit Gillis Santa, MD [facility]  Showing recent visits within past 90 days and meeting all other requirements Today's Visits Date Type Provider Dept  09/11/20 Office Visit Gillis Santa, MD [facility]  Showing today's visits and meeting all other requirements Future Appointments Date Type Provider Dept  11/29/20 Appointment Gillis Santa, MD [facility]  Showing future appointments within next 90 days and meeting all other requirements  I discussed the assessment and treatment plan with the patient. The patient was provided an opportunity to ask questions and all were answered. The patient agreed with the plan and demonstrated an understanding of the instructions.  Patient advised to call back or seek an in-person evaluation if the symptoms or condition worsens.  Duration of encounter: 71mnutes.  Note by: BGillis Santa MD Date: 09/11/2020; Time: 9:27 AM

## 2020-09-11 NOTE — Patient Instructions (Signed)

## 2020-09-11 NOTE — Progress Notes (Signed)
Nursing Pain Medication Assessment:  Safety precautions to be maintained throughout the outpatient stay will include: orient to surroundings, keep bed in low position, maintain call bell within reach at all times, provide assistance with transfer out of bed and ambulation.  Medication Inspection Compliance: Pill count conducted under aseptic conditions, in front of the patient. Neither the pills nor the bottle was removed from the patient's sight at any time. Once count was completed pills were immediately returned to the patient in their original bottle.  Medication #1: Tramadol (Ultram) Pill/Patch Count: 0 of 30 pills remain Pill/Patch Appearance: Markings consistent with prescribed medication Bottle Appearance: Standard pharmacy container. Clearly labeled. Filled Date:07/31/2020 Last Medication intake:  Ran out of medicine more than 48 hours ago  Medication #2: Hydrocodone/APAP Pill/Patch Count: 25 of 60 pills remain Pill/Patch Appearance: Markings consistent with prescribed medication Bottle Appearance: Standard pharmacy container. Clearly labeled. Filled Date:07/23/2020 Last Medication intake:  Yesterday

## 2020-09-25 DIAGNOSIS — Z79899 Other long term (current) drug therapy: Secondary | ICD-10-CM | POA: Diagnosis not present

## 2020-09-25 DIAGNOSIS — Z853 Personal history of malignant neoplasm of breast: Secondary | ICD-10-CM | POA: Diagnosis not present

## 2020-09-25 DIAGNOSIS — N6489 Other specified disorders of breast: Secondary | ICD-10-CM | POA: Diagnosis not present

## 2020-09-25 DIAGNOSIS — N939 Abnormal uterine and vaginal bleeding, unspecified: Secondary | ICD-10-CM | POA: Diagnosis not present

## 2020-09-25 DIAGNOSIS — Z08 Encounter for follow-up examination after completed treatment for malignant neoplasm: Secondary | ICD-10-CM | POA: Diagnosis not present

## 2020-09-25 DIAGNOSIS — I10 Essential (primary) hypertension: Secondary | ICD-10-CM | POA: Diagnosis not present

## 2020-09-25 DIAGNOSIS — M8589 Other specified disorders of bone density and structure, multiple sites: Secondary | ICD-10-CM | POA: Diagnosis not present

## 2020-09-25 DIAGNOSIS — Z780 Asymptomatic menopausal state: Secondary | ICD-10-CM | POA: Diagnosis not present

## 2020-09-25 DIAGNOSIS — M810 Age-related osteoporosis without current pathological fracture: Secondary | ICD-10-CM | POA: Diagnosis not present

## 2020-09-25 DIAGNOSIS — C50212 Malignant neoplasm of upper-inner quadrant of left female breast: Secondary | ICD-10-CM | POA: Diagnosis not present

## 2020-09-25 DIAGNOSIS — M5430 Sciatica, unspecified side: Secondary | ICD-10-CM | POA: Diagnosis not present

## 2020-09-25 DIAGNOSIS — Z9189 Other specified personal risk factors, not elsewhere classified: Secondary | ICD-10-CM | POA: Diagnosis not present

## 2020-09-25 DIAGNOSIS — Z79811 Long term (current) use of aromatase inhibitors: Secondary | ICD-10-CM | POA: Diagnosis not present

## 2020-09-27 DIAGNOSIS — I771 Stricture of artery: Secondary | ICD-10-CM | POA: Diagnosis not present

## 2020-09-27 DIAGNOSIS — I701 Atherosclerosis of renal artery: Secondary | ICD-10-CM | POA: Diagnosis not present

## 2020-09-27 DIAGNOSIS — C50912 Malignant neoplasm of unspecified site of left female breast: Secondary | ICD-10-CM | POA: Diagnosis not present

## 2020-09-27 DIAGNOSIS — Z79899 Other long term (current) drug therapy: Secondary | ICD-10-CM | POA: Diagnosis not present

## 2020-09-27 DIAGNOSIS — N2581 Secondary hyperparathyroidism of renal origin: Secondary | ICD-10-CM | POA: Diagnosis not present

## 2020-09-27 DIAGNOSIS — N183 Chronic kidney disease, stage 3 unspecified: Secondary | ICD-10-CM | POA: Diagnosis not present

## 2020-09-27 DIAGNOSIS — E782 Mixed hyperlipidemia: Secondary | ICD-10-CM | POA: Diagnosis not present

## 2020-09-27 DIAGNOSIS — M545 Low back pain, unspecified: Secondary | ICD-10-CM | POA: Diagnosis not present

## 2020-09-27 DIAGNOSIS — R7302 Impaired glucose tolerance (oral): Secondary | ICD-10-CM | POA: Diagnosis not present

## 2020-09-27 DIAGNOSIS — E039 Hypothyroidism, unspecified: Secondary | ICD-10-CM | POA: Diagnosis not present

## 2020-09-27 DIAGNOSIS — I150 Renovascular hypertension: Secondary | ICD-10-CM | POA: Diagnosis not present

## 2020-09-27 DIAGNOSIS — I739 Peripheral vascular disease, unspecified: Secondary | ICD-10-CM | POA: Diagnosis not present

## 2020-09-27 DIAGNOSIS — E538 Deficiency of other specified B group vitamins: Secondary | ICD-10-CM | POA: Diagnosis not present

## 2020-10-08 ENCOUNTER — Ambulatory Visit: Payer: Medicare HMO | Admitting: Student in an Organized Health Care Education/Training Program

## 2020-10-17 ENCOUNTER — Other Ambulatory Visit: Payer: Self-pay

## 2020-10-17 ENCOUNTER — Encounter: Payer: Self-pay | Admitting: Student in an Organized Health Care Education/Training Program

## 2020-10-17 ENCOUNTER — Ambulatory Visit (HOSPITAL_BASED_OUTPATIENT_CLINIC_OR_DEPARTMENT_OTHER): Payer: Medicare HMO | Admitting: Student in an Organized Health Care Education/Training Program

## 2020-10-17 ENCOUNTER — Ambulatory Visit
Admission: RE | Admit: 2020-10-17 | Discharge: 2020-10-17 | Disposition: A | Discharge status: Home / Self Care | Payer: Medicare HMO | Source: Ambulatory Visit | Attending: Student in an Organized Health Care Education/Training Program | Admitting: Student in an Organized Health Care Education/Training Program

## 2020-10-17 VITALS — BP 186/67 | HR 71 | Temp 97.2°F | Resp 18 | Ht 64.0 in | Wt 178.0 lb

## 2020-10-17 DIAGNOSIS — G894 Chronic pain syndrome: Secondary | ICD-10-CM | POA: Insufficient documentation

## 2020-10-17 DIAGNOSIS — M5416 Radiculopathy, lumbar region: Secondary | ICD-10-CM

## 2020-10-17 MED ORDER — ROPIVACAINE HCL 2 MG/ML IJ SOLN
2.0000 mL | Freq: Once | INTRAMUSCULAR | Status: AC
  Administered 2020-10-17: 2 mL via EPIDURAL
  Filled 2020-10-17: qty 10

## 2020-10-17 MED ORDER — SODIUM CHLORIDE 0.9% FLUSH
2.0000 mL | Freq: Once | INTRAVENOUS | Status: AC
  Administered 2020-10-17: 2 mL

## 2020-10-17 MED ORDER — DEXAMETHASONE SODIUM PHOSPHATE 10 MG/ML IJ SOLN
10.0000 mg | Freq: Once | INTRAMUSCULAR | Status: AC
  Administered 2020-10-17: 10 mg
  Filled 2020-10-17: qty 1

## 2020-10-17 MED ORDER — IOHEXOL 180 MG/ML  SOLN
10.0000 mL | Freq: Once | INTRAMUSCULAR | Status: AC
  Administered 2020-10-17: 10 mL via EPIDURAL

## 2020-10-17 MED ORDER — LIDOCAINE HCL 2 % IJ SOLN
20.0000 mL | Freq: Once | INTRAMUSCULAR | Status: AC
  Administered 2020-10-17: 100 mg
  Filled 2020-10-17: qty 20

## 2020-10-17 NOTE — Patient Instructions (Signed)
Pain Management Discharge Instructions  General Discharge Instructions :  If you need to reach your doctor call: Monday-Friday 8:00 am - 4:00 pm at [phone] or toll free [phone].  After clinic hours [phone] to have operator reach doctor.  Bring all of your medication bottles to all your appointments in the pain clinic.  To cancel or reschedule your appointment with Pain Management please remember to call 24 hours in advance to avoid a fee.  Refer to the educational materials which you have been given on: General Risks, I had my Procedure. Discharge Instructions, Post Sedation.  Post Procedure Instructions:  The drugs you were given will stay in your system until tomorrow, so for the next 24 hours you should not drive, make any legal decisions or drink any alcoholic beverages.  You may eat anything you prefer, but it is better to start with liquids then soups and crackers, and gradually work up to solid foods.  Please notify your doctor immediately if you have any unusual bleeding, trouble breathing or pain that is not related to your normal pain.  Depending on the type of procedure that was done, some parts of your body may feel week and/or numb.  This usually clears up by tonight or the next day.  Walk with the use of an assistive device or accompanied by an adult for the 24 hours.  You may use ice on the affected area for the first 24 hours.  Put ice in a Ziploc bag and cover with a towel and place against area 15 minutes on 15 minutes off.  You may switch to heat after 24 hours.Epidural Steroid Injection Patient Information  Description: The epidural space surrounds the nerves as they exit the spinal cord.  In some patients, the nerves can be compressed and inflamed by a bulging disc or a tight spinal canal (spinal stenosis).  By injecting steroids into the epidural space, we can bring irritated nerves into direct contact with a potentially helpful medication.  These  steroids act directly on the irritated nerves and can reduce swelling and inflammation which often leads to decreased pain.  Epidural steroids may be injected anywhere along the spine and from the neck to the low back depending upon the location of your pain.   After numbing the skin with local anesthetic (like Novocaine), a small needle is passed into the epidural space slowly.  You may experience a sensation of pressure while this is being done.  The entire block usually last less than 10 minutes.  Conditions which may be treated by epidural steroids:   Low back and leg pain  Neck and arm pain  Spinal stenosis  Post-laminectomy syndrome  Herpes zoster (shingles) pain  Pain from compression fractures  Preparation for the injection:  1. Do not eat any solid food or dairy products within 8 hours of your appointment.  2. You may drink clear liquids up to 3 hours before appointment.  Clear liquids include water, black coffee, juice or soda.  No milk or cream please. 3. You may take your regular medication, including pain medications, with a sip of water before your appointment  Diabetics should hold regular insulin (if taken separately) and take 1/2 normal NPH dos the morning of the procedure.  Carry some sugar containing items with you to your appointment. 4. A driver must accompany you and be prepared to drive you home after your procedure.  5. Bring all your current medications with your. 6. An IV may be inserted and   sedation may be given at the discretion of the physician.   7. A blood pressure cuff, EKG and other monitors will often be applied during the procedure.  Some patients may need to have extra oxygen administered for a short period. 8. You will be asked to provide medical information, including your allergies, prior to the procedure.  We must know immediately if you are taking blood thinners (like Coumadin/Warfarin)  Or if you are allergic to IV iodine contrast (dye). We must  know if you could possible be pregnant.  Possible side-effects:  Bleeding from needle site  Infection (rare, may require surgery)  Nerve injury (rare)  Numbness & tingling (temporary)  Difficulty urinating (rare, temporary)  Spinal headache ( a headache worse with upright posture)  Light -headedness (temporary)  Pain at injection site (several days)  Decreased blood pressure (temporary)  Weakness in arm/leg (temporary)  Pressure sensation in back/neck (temporary)  Call if you experience:  Fever/chills associated with headache or increased back/neck pain.  Headache worsened by an upright position.  New onset weakness or numbness of an extremity below the injection site  Hives or difficulty breathing (go to the emergency room)  Inflammation or drainage at the infection site  Severe back/neck pain  Any new symptoms which are concerning to you  Please note:  Although the local anesthetic injected can often make your back or neck feel good for several hours after the injection, the pain will likely return.  It takes 3-7 days for steroids to work in the epidural space.  You may not notice any pain relief for at least that one week.  If effective, we will often do a series of three injections spaced 3-6 weeks apart to maximally decrease your pain.  After the initial series, we generally will wait several months before considering a repeat injection of the same type.  If you have any questions, please call [phone] [facility] 

## 2020-10-17 NOTE — Progress Notes (Signed)
PROVIDER NOTE: Information contained herein reflects review and annotations entered in association with encounter. Interpretation of such information and data should be left to medically-trained personnel. Information provided to patient can be located elsewhere in the medical record under "Patient Instructions". Document created using STT-dictation technology, any transcriptional errors that may result from process are unintentional.    Patient: Tamara Finley  Service Category: Procedure  Provider: Gillis Santa, MD  DOB: May 15, 1941  DOS: 10/17/2020  Location: [facility]  MRN: [id number]  Setting: Ambulatory - outpatient  Referring Provider: Ezequiel Kayser, MD  Type: Established Patient  Specialty: Interventional Pain Management  PCP: Ezequiel Kayser, MD   Primary Reason for Visit: Interventional Pain Management Treatment. CC: Back Pain (low), Hip Pain (bilateral), and Leg Pain (Bilateral/)  Procedure:          Anesthesia, Analgesia, Anxiolysis:  Type: Diagnostic Epidural Steroid Injection #1  Region: Caudal Level: Sacrococcygeal   Laterality: Midline aiming at the right  Type: Local Anesthesia  Local Anesthetic: Lidocaine 1-2%  Position: Prone   Indications: 1. Lumbar radiculopathy   2. Chronic pain syndrome    Pain Score: Pre-procedure: 8 /10 Post-procedure: 5/10   Pre-op H&P Assessment:  Tamara Finley is a 80 y.o. (year old), female patient, seen today for interventional treatment. She  has a past surgical history that includes Cardiac surgery; Abdominal aortic aneurysm repair; Cardiac catheterization (N/A, 12/05/2015); Cardiac catheterization (Left, 12/31/2015); Breast excisional biopsy (Left, 2010); Breast excisional biopsy (Left, 07/01/2006); Breast biopsy (Left, 09/03/2016); Breast biopsy (Left, 09/25/2016); Bronchoscopy; CAROTID PTA/STENT INTERVENTION; Colonoscopy; Eye surgery; Laminectomy (07/25/2013); Back surgery; Mediastinoscopy; Mastectomy; Thoracoscopy (12/16/2016);  and Colonoscopy with propofol (N/A, 06/15/2017). Tamara Finley has a current medication list which includes the following prescription(s): acetaminophen, albuterol, anastrozole, aspirin, atorvastatin, azelastine, calcium 600+d plus minerals, carvedilol, fluticasone, furosemide, gabapentin, hydralazine, hydrocodone-acetaminophen, [START ON 11/01/2020] hydrocodone-acetaminophen, levothyroxine, mirtazapine, montelukast, pantoprazole, potassium chloride sa, vitamin b-12, gabapentin, and tramadol. Her primarily concern today is the Back Pain (low), Hip Pain (bilateral), and Leg Pain (Bilateral/)  Initial Vital Signs:  Pulse/HCG Rate: 71ECG Heart Rate: 74 Temp: (!) 97.2 F (36.2 C) Resp: 18 BP: (!) 181/82 SpO2: 100 %  BMI: Estimated body mass index is 30.55 kg/m as calculated from the following:   Height as of this encounter: 5\' 4"  (1.626 m).   Weight as of this encounter: 178 lb (80.7 kg).  Risk Assessment: Allergies: Reviewed. She is allergic to benadryl [diphenhydramine hcl (sleep)] and alprazolam.  Allergy Precautions: None required Coagulopathies: Reviewed. None identified.  Blood-thinner therapy: None at this time Active Infection(s): Reviewed. None identified. Tamara Finley is afebrile  Site Confirmation: Tamara Finley was asked to confirm the procedure and laterality before marking the site Procedure checklist: Completed Consent: Before the procedure and under the influence of no sedative(s), amnesic(s), or anxiolytics, the patient was informed of the treatment options, risks and possible complications. To fulfill our ethical and legal obligations, as recommended by the American Medical Association's Code of Ethics, I have informed the patient of my clinical impression; the nature and purpose of the treatment or procedure; the risks, benefits, and possible complications of the intervention; the alternatives, including doing nothing; the risk(s) and benefit(s) of the alternative treatment(s) or  procedure(s); and the risk(s) and benefit(s) of doing nothing. The patient was provided information about the general risks and possible complications associated with the procedure. These may include, but are not limited to: failure to achieve desired goals, infection, bleeding, organ or nerve damage, allergic reactions, paralysis, and death.  In addition, the patient was informed of those risks and complications associated to Spine-related procedures, such as failure to decrease pain; infection (i.e.: Meningitis, epidural or intraspinal abscess); bleeding (i.e.: epidural hematoma, subarachnoid hemorrhage, or any other type of intraspinal or peri-dural bleeding); organ or nerve damage (i.e.: Any type of peripheral nerve, nerve root, or spinal cord injury) with subsequent damage to sensory, motor, and/or autonomic systems, resulting in permanent pain, numbness, and/or weakness of one or several areas of the body; allergic reactions; (i.e.: anaphylactic reaction); and/or death. Furthermore, the patient was informed of those risks and complications associated with the medications. These include, but are not limited to: allergic reactions (i.e.: anaphylactic or anaphylactoid reaction(s)); adrenal axis suppression; blood sugar elevation that in diabetics may result in ketoacidosis or comma; water retention that in patients with history of congestive heart failure may result in shortness of breath, pulmonary edema, and decompensation with resultant heart failure; weight gain; swelling or edema; medication-induced neural toxicity; particulate matter embolism and blood vessel occlusion with resultant organ, and/or nervous system infarction; and/or aseptic necrosis of one or more joints. Finally, the patient was informed that Medicine is not an exact science; therefore, there is also the possibility of unforeseen or unpredictable risks and/or possible complications that may result in a catastrophic outcome. The patient  indicated having understood very clearly. We have given the patient no guarantees and we have made no promises. Enough time was given to the patient to ask questions, all of which were answered to the patient's satisfaction. Ms. Mcglown has indicated that she wanted to continue with the procedure. Attestation: I, the ordering provider, attest that I have discussed with the patient the benefits, risks, side-effects, alternatives, likelihood of achieving goals, and potential problems during recovery for the procedure that I have provided informed consent. Date  Time: 10/17/2020  8:23 AM  Pre-Procedure Preparation:  Monitoring: As per clinic protocol. Respiration, ETCO2, SpO2, BP, heart rate and rhythm monitor placed and checked for adequate function Safety Precautions: Patient was assessed for positional comfort and pressure points before starting the procedure. Time-out: I initiated and conducted the "Time-out" before starting the procedure, as per protocol. The patient was asked to participate by confirming the accuracy of the "Time Out" information. Verification of the correct person, site, and procedure were performed and confirmed by me, the nursing staff, and the patient. "Time-out" conducted as per Joint Commission's Universal Protocol (UP.01.01.01). Time: 0908  Description of Procedure:          Target Area: Caudal Epidural Canal. Approach: Midline approach. Area Prepped: Entire Posterior Sacrococcygeal Region DuraPrep (Iodine Povacrylex [0.7% available iodine] and Isopropyl Alcohol, 74% w/w) Safety Precautions: Aspiration looking for blood return was conducted prior to all injections. At no point did we inject any substances, as a needle was being advanced. No attempts were made at seeking any paresthesias. Safe injection practices and needle disposal techniques used. Medications properly checked for expiration dates. SDV (single dose vial) medications used. Description of the Procedure:  Protocol guidelines were followed. The patient was placed in position over the fluoroscopy table. The target area was identified and the area prepped in the usual manner. Skin & deeper tissues infiltrated with local anesthetic. Appropriate amount of time allowed to pass for local anesthetics to take effect. The procedure needles were then advanced to the target area. Proper needle placement secured. Negative aspiration confirmed. Solution injected in intermittent fashion, asking for systemic symptoms every 0.5cc of injectate. The needles were then removed and the area cleansed,  making sure to leave some of the prepping solution back to take advantage of its long term bactericidal properties. Vitals:   10/17/20 0824 10/17/20 0826 10/17/20 0907 10/17/20 0912  BP:  (!) 181/82 (!) 172/71 (!) 186/67  Pulse:  71    Resp:  18 16 18   Temp:  (!) 97.2 F (36.2 C)    SpO2:  100% 100% 100%  Weight: 178 lb (80.7 kg)     Height: 5\' 4"  (1.626 m)       Start Time: 0908 hrs. End Time: 0912 hrs. Materials:  Needle(s) Type: Epidural needle Gauge: 22G Length: 3.5-in Medication(s): Please see orders for medications and dosing details. 6 cc solution made of 3 cc of preservative-free saline, 2 cc of 0.2% ropivacaine, 1 cc of Decadron 10 mg/cc.  Imaging Guidance (Spinal):          Type of Imaging Technique: Fluoroscopy Guidance (Spinal) Indication(s): Assistance in needle guidance and placement for procedures requiring needle placement in or near specific anatomical locations not easily accessible without such assistance. Exposure Time: Please see nurses notes. Contrast: Before injecting any contrast, we confirmed that the patient did not have an allergy to iodine, shellfish, or radiological contrast. Once satisfactory needle placement was completed at the desired level, radiological contrast was injected. Contrast injected under live fluoroscopy. No contrast complications. See chart for type and volume of contrast  used. Fluoroscopic Guidance: I was personally present during the use of fluoroscopy. "Tunnel Vision Technique" used to obtain the best possible view of the target area. Parallax error corrected before commencing the procedure. "Direction-depth-direction" technique used to introduce the needle under continuous pulsed fluoroscopy. Once target was reached, antero-posterior, oblique, and lateral fluoroscopic projection used confirm needle placement in all planes. Images permanently stored in EMR. Interpretation: I personally interpreted the imaging intraoperatively. Adequate needle placement confirmed in multiple planes. Appropriate spread of contrast into desired area was observed. No evidence of afferent or efferent intravascular uptake. No intrathecal or subarachnoid spread observed. Permanent images saved into the patient's record.  Post-operative Assessment:  Post-procedure Vital Signs:  Pulse/HCG Rate: 7173 Temp: (!) 97.2 F (36.2 C) Resp: 18 BP: (!) 186/67 SpO2: 100 %  EBL: None  Complications: No immediate post-treatment complications observed by team, or reported by patient.  Note: The patient tolerated the entire procedure well. A repeat set of vitals were taken after the procedure and the patient was kept under observation following institutional policy, for this type of procedure. Post-procedural neurological assessment was performed, showing return to baseline, prior to discharge. The patient was provided with post-procedure discharge instructions, including a section on how to identify potential problems. Should any problems arise concerning this procedure, the patient was given instructions to immediately contact us, at any time, without hesitation. In any case, we plan to contact the patient by telephone for a follow-up status report regarding this interventional procedure.  Comments:  No additional relevant information.  Plan of Care  Orders:  Orders Placed This Encounter   Procedures  . [facility] PA[facility] C-ARM 1-60 MIN NO REPORT    Intraoperative interpretation by procedural physician at [facility].    Standing Status:   Standing    Number of Occurrences:   1    Order Specific Question:   Reason for exam:    Answer:   Assistance in needle guidance and placement for procedures requiring needle placement in or near specific anatomical locations not easily accessible without such assistance.   Chronic Opioid Analgesic:  Hydrocodone  5 mg BID prn   Medications ordered for procedure: Meds ordered this encounter  Medications  . iohexol (OMNIPAQUE) 180 MG/ML injection 10 mL    Must be Myelogram-compatible. If not available, you may substitute with a water-soluble, non-ionic, hypoallergenic, myelogram-compatible radiological contrast medium.  Marland Kitchen lidocaine (XYLOCAINE) 2 % (with pres) injection 400 mg  . ropivacaine (PF) 2 mg/mL (0.2%) (NAROPIN) injection 2 mL  . sodium chloride flush (NS) 0.9 % injection 2 mL  . dexamethasone (DECADRON) injection 10 mg   Medications administered: We administered iohexol, lidocaine, ropivacaine (PF) 2 mg/mL (0.2%), sodium chloride flush, and dexamethasone.  See the medical record for exact dosing, route, and time of administration.  Follow-up plan:   Return in about 4 weeks (around 11/14/2020) for post procedure follow up- virtual.      consider RFA in future, Sprint PNS of medial branch at L4         Recent Visits Date Type Provider Dept  09/11/20 Office Visit Gillis Santa, MD [facility]  07/31/20 Office Visit Gillis Santa, MD [facility]  Showing recent visits within past 90 days and meeting all other requirements Today's Visits Date Type Provider Dept  10/17/20 Procedure visit Gillis Santa, MD [facility]  Showing today's visits and meeting all other requirements Future Appointments Date Type Provider Dept  11/12/20 Appointment Gillis Santa, MD [facility]  11/19/20  Appointment Gillis Santa, MD [facility]  11/29/20 Appointment Gillis Santa, MD [facility]  Showing future appointments within next 90 days and meeting all other requirements  Disposition: Discharge home  Discharge (Date  Time): 10/17/2020; 0930 hrs.   Primary Care Physician: Ezequiel Kayser, MD Location: [facility] Note by: Gillis Santa, MD Date: 10/17/2020; Time: 10:56 AM  Disclaimer:  Medicine is not an exact science. The only guarantee in medicine is that nothing is guaranteed. It is important to note that the decision to proceed with this intervention was based on the information collected from the patient. The Data and conclusions were drawn from the patient's questionnaire, the interview, and the physical examination. Because the information was provided in large part by the patient, it cannot be guaranteed that it has not been purposely or unconsciously manipulated. Every effort has been made to obtain as much relevant data as possible for this evaluation. It is important to note that the conclusions that lead to this procedure are derived in large part from the available data. Always take into account that the treatment will also be dependent on availability of resources and existing treatment guidelines, considered by other Pain Management Practitioners as being common knowledge and practice, at the time of the intervention. For Medico-Legal purposes, it is also important to point out that variation in procedural techniques and pharmacological choices are the acceptable norm. The indications, contraindications, technique, and results of the above procedure should only be interpreted and judged by a Board-Certified Interventional Pain Specialist with extensive familiarity and expertise in the same exact procedure and technique.

## 2020-10-17 NOTE — Progress Notes (Signed)
Safety precautions to be maintained throughout the outpatient stay will include: orient to surroundings, keep bed in low position, maintain call bell within reach at all times, provide assistance with transfer out of bed and ambulation.  

## 2020-10-18 ENCOUNTER — Telehealth: Payer: Self-pay | Admitting: *Deleted

## 2020-10-18 NOTE — Telephone Encounter (Signed)
No problems post procedure. 

## 2020-11-09 DIAGNOSIS — J329 Chronic sinusitis, unspecified: Secondary | ICD-10-CM | POA: Diagnosis not present

## 2020-11-09 DIAGNOSIS — J449 Chronic obstructive pulmonary disease, unspecified: Secondary | ICD-10-CM | POA: Diagnosis not present

## 2020-11-12 ENCOUNTER — Telehealth: Payer: Medicare HMO | Admitting: Student in an Organized Health Care Education/Training Program

## 2020-11-19 ENCOUNTER — Ambulatory Visit: Payer: Medicare HMO | Admitting: Student in an Organized Health Care Education/Training Program

## 2020-11-22 ENCOUNTER — Other Ambulatory Visit: Payer: Self-pay

## 2020-11-22 ENCOUNTER — Encounter: Payer: Self-pay | Admitting: Student in an Organized Health Care Education/Training Program

## 2020-11-22 ENCOUNTER — Ambulatory Visit
Payer: Medicare HMO | Attending: Student in an Organized Health Care Education/Training Program | Admitting: Student in an Organized Health Care Education/Training Program

## 2020-11-22 DIAGNOSIS — G894 Chronic pain syndrome: Secondary | ICD-10-CM

## 2020-11-22 DIAGNOSIS — M533 Sacrococcygeal disorders, not elsewhere classified: Secondary | ICD-10-CM

## 2020-11-22 DIAGNOSIS — M47816 Spondylosis without myelopathy or radiculopathy, lumbar region: Secondary | ICD-10-CM | POA: Insufficient documentation

## 2020-11-22 DIAGNOSIS — M5416 Radiculopathy, lumbar region: Secondary | ICD-10-CM | POA: Diagnosis not present

## 2020-11-22 DIAGNOSIS — G8929 Other chronic pain: Secondary | ICD-10-CM

## 2020-11-22 DIAGNOSIS — M5136 Other intervertebral disc degeneration, lumbar region: Secondary | ICD-10-CM | POA: Diagnosis not present

## 2020-11-22 MED ORDER — HYDROCODONE-ACETAMINOPHEN 5-325 MG PO TABS: 1.0000 | ORAL_TABLET | Freq: Two times a day (BID) | ORAL | 0 refills | Status: AC | PRN

## 2020-11-22 NOTE — Progress Notes (Signed)
Patient: Tamara Finley  Service Category: E/M  Provider: Gillis Santa, MD  DOB: August 14, 1941  DOS: 11/22/2020  Location: Office  MRN: [id number]  Setting: Ambulatory outpatient  Referring Provider: Ezequiel Kayser, MD  Type: Established Patient  Specialty: Interventional Pain Management  PCP: Ezequiel Kayser, MD  Location: Home  Delivery: TeleHealth     Virtual Encounter - Pain Management PROVIDER NOTE: Information contained herein reflects review and annotations entered in association with encounter. Interpretation of such information and data should be left to medically-trained personnel. Information provided to patient can be located elsewhere in the medical record under "Patient Instructions". Document created using STT-dictation technology, any transcriptional errors that may result from process are unintentional.    Contact & Pharmacy Preferred: [phone] Home: [phone] (home) Mobile: [phone] (mobile) E-mail: [email or web address]  [id number] [place] [address]Phone: [phone]: [phone]  Pre-screening  Ms. MAmerooffered "in-person" vs "virtual" encounter. She indicated preferring virtual for this encounter.   Reason COVID-19*  Social distancing based on CDC and AMA recommendations.   I contacted [name] 11/22/2020 via video conference.      I clearly identified myself as BGillis Santa MD. I verified that I was speaking with the correct person using two identifiers (Name: Tamara Finley and date of birth: 1September 11, 1942.  Consent I sought verbal advanced consent from NMcarthur Rossettifor virtual visit interactions. I informed Ms. MRiekeof possible security and privacy concerns, risks, and limitations associated with providing "not-in-person" medical evaluation and management services. I also informed Ms. MMezquitaof the availability of "in-person" appointments. Finally, I informed her that there would be  a charge for the virtual visit and that she could be  personally, fully or partially, financially responsible for it. Ms. MSolizexpressed understanding and agreed to proceed.   Historic Elements   Ms. NRISE TRAEGERis a 80y.o. year old, female patient evaluated today after our last contact on 10/17/2020. [name] has a past medical history of Abdominal aortic aneurysm (AAA) ([place] (2000[place]), Age related entropion, unspecified laterality, Anxiety, Anxiety and depression, Bilateral renal artery stenosis ([place], Breast calcification, left, Cancer ([place] (10/20/2016), Carotid artery thrombosis, bilateral, Carotid stenosis, Chronic bronchitis ([place], Chronic insomnia, COPD (chronic obstructive pulmonary disease) ([place], Coronary artery disease, CRI (chronic renal insufficiency), stage 3 (moderate) ([place], Depression, Diabetes mellitus without complication ([place], GERD (gastroesophageal reflux disease), Heart palpitations, History of AAA (abdominal aortic aneurysm) repair, History of colon polyps, History of TIA (transient ischemic attack), Hypercholesterolemia, Hypertension, Hypothyroidism, Migraine headache, Osteoarthritis, Osteoporosis, Peripheral arterial disease (HE[place], Pulmonary scarring, Renal disorder, SOB (shortness of breath), Stenosis of left subclavian artery ([place], Stroke ([place], Thyroid disease, TIA (transient ischemic attack), and Type 2 diabetes mellitus with vascular disease ([place]. She also  has a past surgical history that includes Cardiac surgery; Abdominal aortic aneurysm repair; Cardiac catheterization (N/A, 12/05/2015); Cardiac catheterization (Left, 12/31/2015); Breast excisional biopsy (Left, 2010); Breast excisional biopsy (Left, 07/01/2006); Breast biopsy (Left, 09/03/2016); Breast biopsy (Left, 09/25/2016); Bronchoscopy; CAROTID PTA/STENT INTERVENTION; Colonoscopy; Eye surgery; Laminectomy (07/25/2013); Back surgery; Mediastinoscopy; Mastectomy; Thoracoscopy (12/16/2016); and Colonoscopy with propofol  (N/A, 06/15/2017). Ms. MDininohas a current medication list which includes the following prescription(s): acetaminophen, albuterol, anastrozole, aspirin, atorvastatin, azelastine, calcium 600+d plus minerals, carvedilol, fluticasone, furosemide, gabapentin, gabapentin, hydralazine, [START ON 12/10/2020] hydrocodone-acetaminophen, [START ON 01/09/2021] hydrocodone-acetaminophen, levothyroxine, mirtazapine, montelukast, pantoprazole, potassium chloride sa, tramadol, and vitamin b-12. She  reports that she quit smoking about  7 years ago. Her smoking use included cigarettes. She has a 100.00 pack-year smoking history. She has never used smokeless tobacco. She reports that she does not drink alcohol and does not use drugs. [name] is allergic to benadryl [diphenhydramine hcl (sleep)] and alprazolam.   HPI  Today, she is being contacted for both, medication management and a post-procedure assessment.  Patient endorses worsening low back pain as well as right leg pain.  No long-term benefit after right caudal injection performed on 10/17/2020.  Patient is on hydrocodone 5 mg twice a day and feels like she needs to take additional medication to get pain relief.  She states that on certain days she has to take an extra tablet at night.  We discussed increasing her monthly quantity to 75 so that she can take 2.5 tablets a given day to help manage her pain.  Discussed bowel regimen and use of Colace and/or MiraLAX for constipation.  Post-Procedure Evaluation  Procedure (10/17/2020): Type: Diagnostic Epidural Steroid Injection #1  Region: Caudal Level: Sacrococcygeal   Laterality: Midline aiming at the right  Sedation: Please see nurses note.  Effectiveness during initial hour after procedure(Ultra-Short Term Relief): 100%  Local anesthetic used: Long-acting (4-6 hours) Effectiveness: Defined as any analgesic benefit obtained secondary to the administration of local anesthetics. This carries significant diagnostic  value as to the etiological location, or anatomical origin, of the pain. Duration of benefit is expected to coincide with the duration of the local anesthetic used.  Effectiveness during initial 4-6 hours after procedure(Short-Term Relief): 100%  Long-term benefit: Defined as any relief past the pharmacologic duration of the local anesthetics.  Effectiveness past the initial 6 hours after procedure(Long-Term Relief): 100% 1 day  Current benefits: Defined as benefit that persist at this time.   Analgesia:  Back to baseline   Pharmacotherapy Assessment  Analgesic: Hydrocodone 5 mg 3 times daily prn, quantity 75/month    Monitoring: [place] PMP: PDMP reviewed during this encounter.       Pharmacotherapy: No side-effects or adverse reactions reported. Compliance: No problems identified. Effectiveness: Clinically acceptable. Plan: Refer to "POC".  UDS:  Summary  Date Value Ref Range Status  05/08/2020 Note  Final    Comment:    ==================================================================== [employer] Select 13 (MW) ==================================================================== Test                             Result       Flag       Units  Drug Present and Declared for Prescription Verification   Hydrocodone                    2497         EXPECTED   ng/mg creat   Hydromorphone                  468          EXPECTED   ng/mg creat   Dihydrocodeine                 311          EXPECTED   ng/mg creat   Norhydrocodone                 941          EXPECTED   ng/mg creat    Sources of hydrocodone include scheduled prescription medications.    Hydromorphone, dihydrocodeine and norhydrocodone are expected  metabolites of hydrocodone. Hydromorphone and dihydrocodeine are    also available as scheduled prescription medications.  ==================================================================== Test                      Result    Flag   Units      Ref Range   Creatinine               37               mg/dL      >=20 ==================================================================== Declared Medications:  The flagging and interpretation on this report are based on the  following declared medications.  Unexpected results may arise from  inaccuracies in the declared medications.   **Note: The testing scope of this panel includes these medications:   Hydrocodone   **Note: The testing scope of this panel does not include the  following reported medications:   Acetaminophen  Albuterol  Anastrozole (Arimidex)  Aspirin  Atorvastatin  Calcium  Carvedilol (Coreg)  Cyanocobalamin  Fluticasone (Flonase)  Furosemide  Gabapentin  Hydralazine  Levothyroxine  Mirtazapine (Remeron)  Pantoprazole  Potassium (Klor-Con)  Vitamin D ==================================================================== For clinical consultation, please call [phone]. ====================================================================     Laboratory Chemistry Profile   Renal Lab Results  Component Value Date   BUN 9 01/01/2016   CREATININE 1.03 (H) 01/01/2016   GFRAA >60 01/01/2016   GFRNONAA 53 (L) 01/01/2016     Hepatic Lab Results  Component Value Date   AST 28 10/17/2015   ALT 17 10/17/2015   ALBUMIN 3.7 10/17/2015   ALKPHOS 64 10/17/2015     Electrolytes Lab Results  Component Value Date   NA 139 01/01/2016   K 3.5 01/01/2016   CL 106 01/01/2016   CALCIUM 8.2 (L) 01/01/2016     Bone No results found for: VD25OH, [id number], FU9323FT7, DU2025KY7, 25OHVITD1, 25OHVITD2, 25OHVITD3, TESTOFREE, TESTOSTERONE   Inflammation (CRP: Acute Phase) (ESR: Chronic Phase) Lab Results  Component Value Date   ESRSEDRATE 23 10/12/2015       Note: Above Lab results reviewed.  Assessment  The primary encounter diagnosis was Lumbar radiculopathy. Diagnoses of Lumbar spondylosis, Lumbar degenerative disc disease, Chronic SI joint pain, Lumbar facet arthropathy (L4/L5  and L5/S1), and Chronic pain syndrome were also pertinent to this visit.  Plan of Care  Ms. KUMIKO FISHMAN has a current medication list which includes the following long-term medication(s): albuterol, azelastine, calcium 600+d plus minerals, carvedilol, furosemide, gabapentin, gabapentin, levothyroxine, mirtazapine, montelukast, and pantoprazole.  Pharmacotherapy (Medications Ordered): Meds ordered this encounter  Medications  . HYDROcodone-acetaminophen (NORCO/VICODIN) 5-325 MG tablet    Sig: Take 1-2 tablets by mouth 2 (two) times daily as needed for moderate pain.    Dispense:  75 tablet    Refill:  0  . HYDROcodone-acetaminophen (NORCO/VICODIN) 5-325 MG tablet    Sig: Take 1-2 tablets by mouth 2 (two) times daily as needed for moderate pain.    Dispense:  75 tablet    Refill:  0    Follow-up plan:   Return in about 2 months (around 02/05/2021) for Medication Management, in person.     consider RFA in future, Sprint PNS of medial branch at L4          Recent Visits Date Type Provider Dept  10/17/20 Procedure visit Gillis Santa, MD [facility]  09/11/20 Office Visit Gillis Santa, MD [facility]  Showing recent visits within past 90 days and meeting all other requirements Today's  Visits Date Type Provider Dept  11/22/20 Office Visit Gillis Santa, MD [facility]  Showing today's visits and meeting all other requirements Future Appointments No visits were found meeting these conditions. Showing future appointments within next 90 days and meeting all other requirements  I discussed the assessment and treatment plan with the patient. The patient was provided an opportunity to ask questions and all were answered. The patient agreed with the plan and demonstrated an understanding of the instructions.  Patient advised to call back or seek an in-person evaluation if the symptoms or condition worsens.  Duration of encounter:30 minutes.  Note by: Gillis Santa, MD Date: 11/22/2020; Time: 12:17 PM

## 2020-11-29 ENCOUNTER — Encounter: Payer: Medicare HMO | Admitting: Student in an Organized Health Care Education/Training Program

## 2020-11-30 ENCOUNTER — Encounter (INDEPENDENT_AMBULATORY_CARE_PROVIDER_SITE_OTHER): Payer: Medicare HMO

## 2020-11-30 ENCOUNTER — Ambulatory Visit (INDEPENDENT_AMBULATORY_CARE_PROVIDER_SITE_OTHER): Payer: Medicare HMO | Admitting: Vascular Surgery

## 2020-12-06 ENCOUNTER — Telehealth: Payer: Self-pay | Admitting: *Deleted

## 2020-12-06 ENCOUNTER — Telehealth: Payer: Self-pay

## 2020-12-06 NOTE — Telephone Encounter (Signed)
Called and spoke with Kieth Brightly, patient's daughter, after speaking with [facility] about [facility].  I am unclear as to why Dr Holley Raring made the fill dates for 12/10/20.  Pharmacy states there is a leftover Rx from March but is qty of 60 and the new Rx is qty of 75.  Kieth Brightly states that they will be going out of town on Sunday and her mother is concerned about not having pain medication.  I told her I would clarify with Dr Holley Raring and see if we can do an early fill on Saturday with the understanding that May's fill could not be early, medication would have to last until then.    [facility] and [facility] to fill on 12/08/20. Spoke with Apolonio Schneiders.  They are unable to do an early fill.    Spoke with Dr Holley Raring, he states fill the previous Rx from March and then she can fill the April and May.  [facility]ed [facility] to let [facility] know and I will also call her daughter to let her know.

## 2020-12-06 NOTE — Telephone Encounter (Signed)
Pt states she can not get her medication they will not allow her to pick it up is there any way you can contact the pharmacist to get it complete

## 2020-12-20 DIAGNOSIS — D631 Anemia in chronic kidney disease: Secondary | ICD-10-CM | POA: Diagnosis not present

## 2020-12-20 DIAGNOSIS — N183 Chronic kidney disease, stage 3 unspecified: Secondary | ICD-10-CM | POA: Diagnosis not present

## 2020-12-20 DIAGNOSIS — I951 Orthostatic hypotension: Secondary | ICD-10-CM | POA: Diagnosis not present

## 2020-12-20 DIAGNOSIS — N1831 Chronic kidney disease, stage 3a: Secondary | ICD-10-CM | POA: Diagnosis not present

## 2020-12-20 DIAGNOSIS — E1122 Type 2 diabetes mellitus with diabetic chronic kidney disease: Secondary | ICD-10-CM | POA: Diagnosis not present

## 2020-12-20 DIAGNOSIS — I150 Renovascular hypertension: Secondary | ICD-10-CM | POA: Diagnosis not present

## 2020-12-22 DIAGNOSIS — N2581 Secondary hyperparathyroidism of renal origin: Secondary | ICD-10-CM | POA: Insufficient documentation

## 2020-12-25 DIAGNOSIS — I701 Atherosclerosis of renal artery: Secondary | ICD-10-CM | POA: Diagnosis not present

## 2020-12-25 DIAGNOSIS — R600 Localized edema: Secondary | ICD-10-CM | POA: Diagnosis not present

## 2020-12-25 DIAGNOSIS — N1831 Chronic kidney disease, stage 3a: Secondary | ICD-10-CM | POA: Diagnosis not present

## 2020-12-25 DIAGNOSIS — R809 Proteinuria, unspecified: Secondary | ICD-10-CM | POA: Diagnosis not present

## 2020-12-28 ENCOUNTER — Encounter (INDEPENDENT_AMBULATORY_CARE_PROVIDER_SITE_OTHER): Payer: Self-pay | Admitting: Vascular Surgery

## 2020-12-28 ENCOUNTER — Other Ambulatory Visit: Payer: Self-pay

## 2020-12-28 ENCOUNTER — Ambulatory Visit (INDEPENDENT_AMBULATORY_CARE_PROVIDER_SITE_OTHER): Payer: Medicare HMO

## 2020-12-28 ENCOUNTER — Ambulatory Visit (INDEPENDENT_AMBULATORY_CARE_PROVIDER_SITE_OTHER): Payer: Medicare HMO | Admitting: Vascular Surgery

## 2020-12-28 VITALS — BP 168/73 | HR 83 | Resp 16 | Wt 179.8 lb

## 2020-12-28 DIAGNOSIS — I701 Atherosclerosis of renal artery: Secondary | ICD-10-CM

## 2020-12-28 DIAGNOSIS — E785 Hyperlipidemia, unspecified: Secondary | ICD-10-CM

## 2020-12-28 DIAGNOSIS — I150 Renovascular hypertension: Secondary | ICD-10-CM

## 2020-12-28 DIAGNOSIS — I6522 Occlusion and stenosis of left carotid artery: Secondary | ICD-10-CM

## 2020-12-28 DIAGNOSIS — E1159 Type 2 diabetes mellitus with other circulatory complications: Secondary | ICD-10-CM

## 2020-12-28 DIAGNOSIS — M7989 Other specified soft tissue disorders: Secondary | ICD-10-CM | POA: Diagnosis not present

## 2020-12-28 DIAGNOSIS — Z9889 Other specified postprocedural states: Secondary | ICD-10-CM

## 2020-12-28 DIAGNOSIS — D631 Anemia in chronic kidney disease: Secondary | ICD-10-CM

## 2020-12-28 DIAGNOSIS — I6523 Occlusion and stenosis of bilateral carotid arteries: Secondary | ICD-10-CM

## 2020-12-28 DIAGNOSIS — N183 Chronic kidney disease, stage 3 unspecified: Secondary | ICD-10-CM

## 2020-12-28 NOTE — Progress Notes (Signed)
MRN : [id number]  Tamara Finley is a 80 y.o. (May 18, 1941) female who presents with chief complaint of  Chief Complaint  Patient presents with  . Follow-up    6 month ultrasound follow up  .  History of Present Illness: Patient returns today in follow up of multiple vascular issues.  She is doing reasonably well today.  She is having a little more swelling than she was previously.  She has been intermittently wearing her compression stockings.  No open wounds or infection.  The left leg is a little worse than the right.  This comes and goes and is something she has dealt with for years. She is studied with carotid duplex today which shows slightly improved velocities in the right carotid system now in the 40 to 59% range.  She is status post left carotid stent and her velocities are in the 1 to 39% range on the left.  She denies any focal neurologic symptoms. Specifically, the patient denies amaurosis fugax, speech or swallowing difficulties, or arm or leg weakness or numbness She is also followed for renal artery disease.  She has a solitary left kidney.  She has known chronic kidney disease which she says is stable.  This is stage III.  Her blood pressure has been reasonably good although it does go up at times.  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  Current Outpatient Medications  Medication Sig Dispense Refill  . acetaminophen (TYLENOL) 500 MG tablet Take 1,000 mg by mouth every 8 (eight) hours as needed for moderate pain.     Marland Kitchen albuterol (PROVENTIL HFA;VENTOLIN HFA) 108 (90 BASE) MCG/ACT inhaler Inhale 2 puffs into the lungs every 6 (six) hours as needed for wheezing or shortness of breath. Reported on 12/31/2015    . anastrozole (ARIMIDEX) 1 MG tablet Take 1 mg by mouth daily.     Marland Kitchen aspirin 81 MG chewable tablet Chew 81 mg by mouth daily.    Marland Kitchen  atorvastatin (LIPITOR) 40 MG tablet Take 40 mg by mouth daily at 6 PM.     . azelastine (ASTELIN) 0.1 % nasal spray Place into both nostrils 2 (two) times daily. Use in each nostril as directed    . Calcium Carbonate-Vit D-Min (CALCIUM 600+D PLUS MINERALS) 600-400 MG-UNIT TABS Take by mouth.    . carvedilol (COREG) 6.25 MG tablet Take 6.25 mg by mouth 2 (two) times daily with a meal.    . fluticasone (FLONASE) 50 MCG/ACT nasal spray Place 1-2 sprays into both nostrils daily as needed for rhinitis.    . furosemide (LASIX) 40 MG tablet Take 1 tablet by mouth 1 day or 1 dose.    . gabapentin (NEURONTIN) 300 MG capsule Take 600 mg by mouth at bedtime.     . hydrALAZINE (APRESOLINE) 50 MG tablet Take 50 mg by mouth 3 (three) times daily.    Marland Kitchen levothyroxine (SYNTHROID, LEVOTHROID) 88 MCG tablet Take 88 mcg by mouth daily before breakfast.    . mirtazapine (REMERON) 15 MG tablet TAKE 1 TABLET BY MOUTH EVERY DAY AT NIGHT  3  . montelukast (SINGULAIR) 10 MG tablet Take 10 mg by mouth at bedtime.    . pantoprazole (PROTONIX) 40 MG tablet Take 40 mg by mouth daily.    . potassium chloride SA (K-DUR,KLOR-CON) 20 MEQ tablet Take 20 mEq by mouth as directed.    Marland Kitchen  vitamin B-12 (CYANOCOBALAMIN) 1000 MCG tablet Take 1 tablet by mouth 1 day or 1 dose.    . gabapentin (NEURONTIN) 300 MG capsule Take 1 capsule by mouth 1 day or 1 dose.    Marland Kitchen HYDROcodone-acetaminophen (NORCO/VICODIN) 5-325 MG tablet Take 1-2 tablets by mouth 2 (two) times daily as needed for moderate pain. (Patient not taking: Reported on 12/28/2020) 75 tablet 0  . [START ON 01/09/2021] HYDROcodone-acetaminophen (NORCO/VICODIN) 5-325 MG tablet Take 1-2 tablets by mouth 2 (two) times daily as needed for moderate pain. (Patient not taking: Reported on 12/28/2020) 75 tablet 0  . traMADol (ULTRAM-ER) 100 MG 24 hr tablet Take 1 tablet (100 mg total) by mouth daily. (Patient not taking: No sig reported) 30 tablet 1   No current facility-administered medications  for this visit.    Past Medical History:  Diagnosis Date  . Abdominal aortic aneurysm (AAA) ([place]) 2000's  . Age related entropion, unspecified laterality   . Anxiety   . Anxiety and depression   . Bilateral renal artery stenosis ([place])   . Breast calcification, left   . Cancer ([place]) 10/20/2016   breast cancer  . Carotid artery thrombosis, bilateral   . Carotid stenosis   . Chronic bronchitis ([facility])   . Chronic insomnia   . COPD (chronic obstructive pulmonary disease) ([place])   . Coronary artery disease   . CRI (chronic renal insufficiency), stage 3 (moderate) (HCC)   . Depression   . Diabetes mellitus without complication ([place])   . GERD (gastroesophageal reflux disease)   . Heart palpitations   . History of AAA (abdominal aortic aneurysm) repair   . History of colon polyps   . History of TIA (transient ischemic attack)   . Hypercholesterolemia   . Hypertension   . Hypothyroidism   . Migraine headache   . Osteoarthritis   . Osteoporosis   . Peripheral arterial disease ([place])   . Pulmonary scarring   . Renal disorder   . SOB (shortness of breath)   . Stenosis of left subclavian artery (HCC)   . Stroke ([place])   . Thyroid disease   . TIA (transient ischemic attack)   . Type 2 diabetes mellitus with vascular disease [facility])     Past Surgical History:  Procedure Laterality Date  . ABDOMINAL AORTIC ANEURYSM REPAIR    . BACK SURGERY    . BREAST BIOPSY Left 09/03/2016   benign  . BREAST BIOPSY Left 09/25/2016   path pending  . BREAST EXCISIONAL BIOPSY Left 2010  . BREAST EXCISIONAL BIOPSY Left 07/01/2006  . BRONCHOSCOPY    . CARDIAC SURGERY    . CAROTID PTA/STENT INTERVENTION    . COLONOSCOPY    . COLONOSCOPY WITH PROPOFOL N/A 06/15/2017   Procedure: COLONOSCOPY WITH PROPOFOL;  Surgeon: Lollie Sails, MD;  Location[facility] END[facility]COPY;  Service: Endoscopy;  Laterality: N/A;  . EYE SURGERY    . LAMINECTOMY  07/25/2013  . MASTECTOMY    . MEDIASTINOSCOPY    . PERIPHERAL  VASCULAR CATHETERIZATION N/A 12/05/2015   Procedure: Renal Angiography;  Surgeon: Katha Cabal, MD;  Location: [facility] CV LAB;  Service: Cardiovascular;  Laterality: N/A;  . PERIPHERAL VASCULAR CATHETERIZATION Left 12/31/2015   Procedure: Carotid PTA/Stent Intervention;  Surgeon: Algernon Huxley, MD;  Location: [place] CV [facility];  Service: Cardiovascular;  Laterality: Left;  . THORACOSCOPY  12/16/2016     Social History   Tobacco Use  . Smoking status: Former Smoker    Packs/day: 2.00  Years: 50.00    Pack years: 100.00    Types: Cigarettes    Quit date: 06/01/2013    Years since quitting: 7.5  . Smokeless tobacco: Never Used  Vaping Use  . Vaping Use: Never used  Substance Use Topics  . Alcohol use: No    Alcohol/week: 0.0 standard drinks  . Drug use: No       Family History  Problem Relation Age of Onset  . CVA Mother   . Hypertension Mother   . Stroke Mother   . Epilepsy Mother   . Dementia Father   . Stroke Father   . Coronary artery disease Father   . Hypertension Father   . Ovarian cancer Sister   . Cancer Brother   . Diabetes Brother      Allergies  Allergen Reactions  . Benadryl [Diphenhydramine Hcl (Sleep)] Other (See Comments)    BAD DREAMS  . Alprazolam Rash    "bad dreams"    REVIEW OF SYSTEMS(Negative unless checked)  Constitutional: [] ???Weight loss[] ???Fever[] ???Chills Cardiac:[] ???Chest pain[] ???Chest pressure[] ???Palpitations [] ???Shortness of breath when laying flat [] ???Shortness of breath at rest [x] ???Shortness of breath with exertion. Vascular: [] ???Pain in legs with walking[] ???Pain in legsat rest[] ???Pain in legs when laying flat [] ???Claudication [] ???Pain in feet when walking [] ???Pain in feet at rest [] ???Pain in feet when laying flat [] ???History of DVT [] ???Phlebitis [x] ???Swelling in legs [] ???Varicose veins [] ???Non-healing ulcers Pulmonary: [] ???Uses home oxygen  [] ???Productive cough[] ???Hemoptysis [] ???Wheeze [x] ???COPD [] ???Asthma Neurologic: [] ???Dizziness [] ???Blackouts [] ???Seizures [] ???History of stroke [x] ???History of TIA[] ???Aphasia [] ???Temporary blindness[] ???Dysphagia [] ???Weaknessor numbness in arms [] ???Weakness or numbnessin legs Musculoskeletal: [x] ???Arthritis [] ???Joint swelling [] ???Joint pain [] ???Low back pain Hematologic:[] ???Easy bruising[] ???Easy bleeding [] ???Hypercoagulable state [] ???Anemic  Gastrointestinal:[] ???Blood in stool[] ???Vomiting blood[] ???Gastroesophageal reflux/heartburn[] ???Abdominal pain Genitourinary: [x] ???Chronic kidney disease [] ???Difficulturination [] ???Frequenturination [] ???Burning with urination[] ???Hematuria Skin: [] ???Rashes [] ???Ulcers [] ???Wounds Psychological: [] ???History of anxiety[] ???History of major depression.  Physical Examination  BP (!) 168/73 (BP Location: Right Arm)   Pulse 83   Resp 16   Wt 179 lb 12.8 oz (81.6 kg)   BMI 30.86 kg/m  Gen:  WD/WN, NAD.  Appears younger than stated age Head: [place]/AT, No temporalis wasting. Ear/Nose/Throat: Hearing grossly intact, nares w/o erythema or drainage Eyes: Conjunctiva clear. Sclera non-icteric Neck: Supple.  Trachea midline Pulmonary:  Good air movement, no use of accessory muscles.  Cardiac: RRR, no JVD Vascular:  Vessel Right Left  Radial Palpable Palpable           Musculoskeletal: M/S 5/5 throughout.  No deformity or atrophy.  1+ right lower extremity edema, 1-2+ left lower extremity edema. Neurologic: Sensation grossly intact in extremities.  Symmetrical.  Speech is fluent.  Psychiatric: Judgment intact, Mood & affect appropriate for pt's clinical situation. Dermatologic: No rashes or ulcers noted.  No cellulitis or open wounds.      Labs No results found for this or any previous visit (from the past 2160 hour(s)).  Radiology No results  found.  Assessment/Plan Type 2 diabetes mellitus with vascular disease (HCC) blood glucose control important in reducing the progression of atherosclerotic disease. Also, involved in wound healing. On appropriate medications.   Hyperlipidemia, unspecified lipid control important in reducing the progression of atherosclerotic disease. Continue statin therapy  History of AAA (abdominal aortic aneurysm) repair Aortobiiliac graft appears patent by duplex at last check  Renovascular hypertension, malignant Blood pressure control is good after left renal artery stent placement a few years ago  Carotid stenosis She is studied with carotid duplex today which shows slightly improved velocities in the right carotid system now in the 40  to 59% range.  She is status post left carotid stent and her velocities are in the 1 to 39% range on the left.  Continue current medical regimen.  Recheck in 6 months.  Renal artery stenosis (HCC) Her duplex today shows 1 to 59% left renal artery stenosis after previous intervention with no hemodynamically significant right renal artery stenosis but an atretic right kidney.  We will continue to monitor this closely on 67-month intervals.  Contact our office with any worsening blood pressure or kidney function issues.  Swelling of limb Recommend compression socks daily and elevation.  If the symptoms get worse, we can certainly pursue a lymphedema pump or other adjuvant therapies.    Leotis Pain, MD  12/28/2020 10:17 AM    This note was created with Dragon medical transcription system.  Any errors from dictation are purely unintentional

## 2020-12-28 NOTE — Assessment & Plan Note (Signed)
Recommend compression socks daily and elevation.  If the symptoms get worse, we can certainly pursue a lymphedema pump or other adjuvant therapies.

## 2020-12-28 NOTE — Assessment & Plan Note (Signed)
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  We will continue to monitor this closely on 37-month intervals.  Contact our office with any worsening blood pressure or kidney function issues.

## 2020-12-28 NOTE — Assessment & Plan Note (Signed)
She is studied with carotid duplex today which shows slightly improved velocities in the right carotid system now in the 40 to 59% range.  She is status post left carotid stent and her velocities are in the 1 to 39% range on the left.  Continue current medical regimen.  Recheck in 6 months.

## 2021-01-17 ENCOUNTER — Encounter: Payer: Medicare HMO | Admitting: Student in an Organized Health Care Education/Training Program

## 2021-01-31 DIAGNOSIS — M79674 Pain in right toe(s): Secondary | ICD-10-CM | POA: Diagnosis not present

## 2021-01-31 DIAGNOSIS — M79675 Pain in left toe(s): Secondary | ICD-10-CM | POA: Diagnosis not present

## 2021-01-31 DIAGNOSIS — B351 Tinea unguium: Secondary | ICD-10-CM | POA: Diagnosis not present

## 2021-01-31 DIAGNOSIS — E119 Type 2 diabetes mellitus without complications: Secondary | ICD-10-CM | POA: Diagnosis not present

## 2021-01-31 DIAGNOSIS — L600 Ingrowing nail: Secondary | ICD-10-CM | POA: Diagnosis not present

## 2021-02-12 ENCOUNTER — Encounter: Payer: Self-pay | Admitting: Student in an Organized Health Care Education/Training Program

## 2021-02-12 ENCOUNTER — Ambulatory Visit
Payer: Medicare HMO | Attending: Student in an Organized Health Care Education/Training Program | Admitting: Student in an Organized Health Care Education/Training Program

## 2021-02-12 ENCOUNTER — Other Ambulatory Visit: Payer: Self-pay

## 2021-02-12 VITALS — BP 152/70 | HR 69 | Temp 96.8°F | Resp 16 | Ht 64.0 in | Wt 182.0 lb

## 2021-02-12 DIAGNOSIS — G894 Chronic pain syndrome: Secondary | ICD-10-CM | POA: Diagnosis not present

## 2021-02-12 DIAGNOSIS — M5030 Other cervical disc degeneration, unspecified cervical region: Secondary | ICD-10-CM | POA: Diagnosis not present

## 2021-02-12 DIAGNOSIS — M542 Cervicalgia: Secondary | ICD-10-CM | POA: Insufficient documentation

## 2021-02-12 MED ORDER — HYDROCODONE-ACETAMINOPHEN 5-325 MG PO TABS: 1.0000 | ORAL_TABLET | Freq: Two times a day (BID) | ORAL | 0 refills | Status: DC | PRN

## 2021-02-12 MED ORDER — GABAPENTIN 300 MG PO CAPS: 600.0000 mg | ORAL_CAPSULE | Freq: Every day | ORAL | 0 refills | Status: DC

## 2021-02-12 MED ORDER — HYDROCODONE-ACETAMINOPHEN 5-325 MG PO TABS: 1.0000 | ORAL_TABLET | Freq: Two times a day (BID) | ORAL | 0 refills | Status: AC | PRN

## 2021-02-12 NOTE — Progress Notes (Signed)
Nursing Pain Medication Assessment:  Safety precautions to be maintained throughout the outpatient stay will include: orient to surroundings, keep bed in low position, maintain call bell within reach at all times, provide assistance with transfer out of bed and ambulation.  Medication Inspection Compliance: Pill count conducted under aseptic conditions, in front of the patient. Neither the pills nor the bottle was removed from the patient's sight at any time. Once count was completed pills were immediately returned to the patient in their original bottle.  Medication: Hydrocodone/APAP Pill/Patch Count:  71 of 75 pills remain Pill/Patch Appearance: Markings consistent with prescribed medication Bottle Appearance: Standard pharmacy container. Clearly labeled. Filled Date: 06 / 09 / 2022 Last Medication intake:  Yesterday

## 2021-02-12 NOTE — Progress Notes (Signed)
PROVIDER NOTE: Information contained herein reflects review and annotations entered in association with encounter. Interpretation of such information and data should be left to medically-trained personnel. Information provided to patient can be located elsewhere in the medical record under "Patient Instructions". Document created using STT-dictation technology, any transcriptional errors that may result from process are unintentional.    Patient: Tamara Finley  Service Category: E/M  Provider: Gillis Santa, MD  DOB: 30-Jul-1941  DOS: 02/12/2021  Specialty: Interventional Pain Management  MRN: [id number]  Setting: Ambulatory outpatient  PCP: Ezequiel Kayser, MD  Type: Established Patient    Referring Provider: Ezequiel Kayser, MD  Location: Office  Delivery: Face-to-face     HPI  [name], a 80 y.o. year old female, is here today because of her Bilateral neck pain [M54.2]. Ms. Rightmyer primary complain today is bilateral low back pain, leg pain  Last encounter: My last encounter with her was on 11/22/20 Pertinent problems: Ms. Luckman has Low back pain radiating to both legs; Peripheral arterial disease ([place]); Right hip pain; Chronic SI joint pain; Lumbar facet arthropathy (L4/L5 and L5/S1); Controlled substance agreement signed; Lumbar spondylosis; and Lumbar radiculopathy on their pertinent problem list. Pain Assessment: Severity of Chronic pain is reported as a 8 /10. Location: Back Lower/left leg to the ankle. Onset: More than a month ago. Quality: Dull. Timing: Constant. Modifying factor(s): medications. Vitals:  height is _0  (1.626 m) and weight is 182 lb (82.6 kg). Her temporal temperature is 96.8 F (36 C) (abnormal). Her blood pressure is 152/70 (abnormal) and her pulse is 69. Her respiration is 16 and oxygen saturation is 99%.   Reason for encounter: medication management.    No significant change in her medical history since her last clinic visit with me other than increased  right-sided neck pain that radiates occasionally to her right shoulder.  It is worse with right lateral rotation of her neck.  Is not associated with any headaches or vision changes.  No inciting or traumatic event.  Otherwise, we will refill her hydrocodone as below as well as gabapentin.   Pharmacotherapy Assessment   Analgesic: Hydrocodone 5 mg BID prn   Monitoring: [place] PMP: PDMP reviewed during this encounter.       Pharmacotherapy: No side-effects or adverse reactions reported. Compliance: No problems identified. Effectiveness: Clinically acceptable.  Landis Martins, RN  02/12/2021  8:25 AM  Sign when Signing Visit Nursing Pain Medication Assessment:  Safety precautions to be maintained throughout the outpatient stay will include: orient to surroundings, keep bed in low position, maintain call bell within reach at all times, provide assistance with transfer out of bed and ambulation.  Medication Inspection Compliance: Pill count conducted under aseptic conditions, in front of the patient. Neither the pills nor the bottle was removed from the patient's sight at any time. Once count was completed pills were immediately returned to the patient in their original bottle.  Medication: Hydrocodone/APAP Pill/Patch Count:  71 of 75 pills remain Pill/Patch Appearance: Markings consistent with prescribed medication Bottle Appearance: Standard pharmacy container. Clearly labeled. Filled Date: 06 / 09 / 2022 Last Medication intake:  Yesterday      UDS:  Summary  Date Value Ref Range Status  05/08/2020 Note  Final    Comment:    ==================================================================== [facility] (MW) ==================================================================== Test  Result       Flag       Units  Drug Present and Declared for Prescription Verification   Hydrocodone                    2497         EXPECTED   ng/mg creat    Hydromorphone                  468          EXPECTED   ng/mg creat   Dihydrocodeine                 311          EXPECTED   ng/mg creat   Norhydrocodone                 941          EXPECTED   ng/mg creat    Sources of hydrocodone include scheduled prescription medications.    Hydromorphone, dihydrocodeine and norhydrocodone are expected    metabolites of hydrocodone. Hydromorphone and dihydrocodeine are    also available as scheduled prescription medications.  ==================================================================== Test                      Result    Flag   Units      Ref Range   Creatinine              37               mg/dL      >=20 ==================================================================== Declared Medications:  The flagging and interpretation on this report are based on the  following declared medications.  Unexpected results may arise from  inaccuracies in the declared medications.   **Note: The testing scope of this panel includes these medications:   Hydrocodone   **Note: The testing scope of this panel does not include the  following reported medications:   Acetaminophen  Albuterol  Anastrozole (Arimidex)  Aspirin  Atorvastatin  Calcium  Carvedilol (Coreg)  Cyanocobalamin  Fluticasone (Flonase)  Furosemide  Gabapentin  Hydralazine  Levothyroxine  Mirtazapine (Remeron)  Pantoprazole  Potassium (Klor-Con)  Vitamin D ==================================================================== For clinical consultation, please call [phone]. ====================================================================      ROS  Constitutional: Denies any fever or chills Gastrointestinal: No reported hemesis, hematochezia, vomiting, or acute GI distress Musculoskeletal:  Bilateral low back, bilateral leg pain right neck pain Neurological: No reported episodes of acute onset apraxia, aphasia, dysarthria, agnosia, amnesia, paralysis, loss of  coordination, or loss of consciousness  Medication Review  Calcium 600+D Plus Minerals, HYDROcodone-acetaminophen, acetaminophen, albuterol, anastrozole, aspirin, atorvastatin, azelastine, carvedilol, fluticasone, furosemide, gabapentin, hydrALAZINE, levothyroxine, mirtazapine, montelukast, pantoprazole, potassium chloride SA, traMADol, and vitamin B-12  History Review  Allergy: Ms. Hiltunen is allergic to benadryl [diphenhydramine hcl (sleep)] and alprazolam. Drug: Ms. Magistro  reports no history of drug use. Alcohol:  reports no history of alcohol use. Tobacco:  reports that she quit smoking about 7 years ago. Her smoking use included cigarettes. She has a 100.00 pack-year smoking history. She has never used smokeless tobacco. Social: Ms. Liese  reports that she quit smoking about 7 years ago. Her smoking use included cigarettes. She has a 100.00 pack-year smoking history. She has never used smokeless tobacco. She reports that she does not drink alcohol and does not use drugs. Medical:  has a past medical history of Abdominal aortic aneurysm (AAA) ([place]) (2000's), Age related  entropion, unspecified laterality, Anxiety, Anxiety and depression, Bilateral renal artery stenosis ([place]), Breast calcification, left, Cancer ([place]) (10/20/2016), Carotid artery thrombosis, bilateral, Carotid stenosis, Chronic bronchitis ([facility]), Chronic insomnia, COPD (chronic obstructive pulmonary disease) ([place]), Coronary artery disease, CRI (chronic renal insufficiency), stage 3 (moderate) (H[facility]), Depression, Diabetes mellitus without complication ([place]), GERD (gastroesophageal reflux disease), Heart palpitations, History of AAA (abdominal aortic aneurysm) repair, History of colon polyps, History of TIA (transient ischemic attack), Hypercholesterolemia, Hypertension, Hypothyroidism, Migraine headache, Osteoarthritis, Osteoporosis, Peripheral arterial disease ([place]), Pulmonary scarring, Renal disorder, SOB (shortness of breath), 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 Surgical: Ms. Demos  has a past surgical history that includes Cardiac surgery; Abdominal aortic aneurysm repair; Cardiac catheterization (N/A, 12/05/2015); Cardiac catheterization (Left, 12/31/2015); Breast excisional biopsy (Left, 2010); Breast excisional biopsy (Left, 07/01/2006); Breast biopsy (Left, 09/03/2016); Breast biopsy (Left, 09/25/2016); Bronchoscopy; CAROTID PTA/STENT INTERVENTION; Colonoscopy; Eye surgery; Laminectomy (07/25/2013); Back surgery; Mediastinoscopy; Mastectomy; Thoracoscopy (12/16/2016); and Colonoscopy with propofol (N/A, 06/15/2017). Family: family history includes CVA in her mother; Cancer in her brother; Coronary artery disease in her father; Dementia in her father; Diabetes in her brother; Epilepsy in her mother; Hypertension in her father and mother; Ovarian cancer in her sister; Stroke in her father and mother.  Laboratory Chemistry Profile   Renal Lab Results  Component Value Date   BUN 9 01/01/2016   CREATININE 1.03 (H) 01/01/2016   GFRAA >60 01/01/2016   GFRNONAA 53 (L) 01/01/2016     Hepatic Lab Results  Component Value Date   AST 28 10/17/2015   ALT 17 10/17/2015   ALBUMIN 3.7 10/17/2015   ALKPHOS 64 10/17/2015     Electrolytes Lab Results  Component Value Date   NA 139 01/01/2016   K 3.5 01/01/2016   CL 106 01/01/2016   CALCIUM 8.2 (L) 01/01/2016     Bone No results found for: VD25OH, [id number], SL3734KA7, GO1157WI2, 25OHVITD1, 25OHVITD2, 25OHVITD3, TESTOFREE, TESTOSTERONE   Inflammation (CRP: Acute Phase) (ESR: Chronic Phase) Lab Results  Component Value Date   ESRSEDRATE 23 10/12/2015       Note: Above Lab results reviewed.  Recent Imaging Review  VAS US CAROTID Carotid Arterial Duplex Study  Patient Name:  Tamara Finley  Date of Exam:   12/28/2020 Medical Rec #: [id number]        Accession #:    [id number] Date of Birth: 1941-03-16      Patient Gender: F Patient Age:   58Y [facility]am [facility] Procedure:      VAS US CAROTID Referring Phys: [id number] [place]  --------------------------------------------------------------------------------   Indications:       Carotid artery disease. Comparison Study:  06/01/2020: Incomparison to prior study, Velocities appear to                    have decreased in the Right ICA;40-59%.  Performing Technologist: Almira Coaster RVS    Examination Guidelines: A complete evaluation includes B-mode imaging, spectral Doppler, color Doppler, and power Doppler as needed of all accessible portions of each vessel. Bilateral testing is considered an integral part of a complete examination. Limited examinations for reoccurring indications may be performed as noted.    Right Carotid Findings: +----------+-------+-------+--------+---------------------------------+--------+           PSV    EDV    StenosisPlaque Description  Comments           cm/s   cm/s                                                     +----------+-------+-------+--------+---------------------------------+--------+ CCA Prox  67     14                                                       +----------+-------+-------+--------+---------------------------------+--------+ CCA Mid   93     27             heterogenous, irregular and                                               calcific                                  +----------+-------+-------+--------+---------------------------------+--------+ CCA Distal108    26             heterogenous, irregular and                                               calcific                                  +----------+-------+-------+--------+---------------------------------+--------+ ICA Prox  164    35                                                        +----------+-------+-------+--------+---------------------------------+--------+ ICA Mid   102    25                                                       +----------+-------+-------+--------+---------------------------------+--------+ ICA Distal114    32                                                       +----------+-------+-------+--------+---------------------------------+--------+ ECA       204    13                                                       +----------+-------+-------+--------+---------------------------------+--------+  +----------+--------+-------+--------+-------------------+  PSV cm/sEDV cmsDescribeArm Pressure (mmHG) +----------+--------+-------+--------+-------------------+ Subclavian69      0                                  +----------+--------+-------+--------+-------------------+  +---------+--------+---+--------+--+ VertebralPSV cm/s108EDV cm/s24 +---------+--------+---+--------+--+     Left Carotid Findings: +----------+-------+-------+--------+---------------------------------+--------+           PSV    EDV    StenosisPlaque Description               Comments           cm/s   cm/s                                                     +----------+-------+-------+--------+---------------------------------+--------+ CCA Prox  69     19                                                       +----------+-------+-------+--------+---------------------------------+--------+ CCA Mid   57     18             heterogenous, irregular and                                               calcific                                  +----------+-------+-------+--------+---------------------------------+--------+ CCA Distal66     21                                                       +----------+-------+-------+--------+---------------------------------+--------+ ICA Prox   88     26                                                       +----------+-------+-------+--------+---------------------------------+--------+ ICA Mid   87     19                                                       +----------+-------+-------+--------+---------------------------------+--------+ ICA Distal106    25                                                       +----------+-------+-------+--------+---------------------------------+--------+ ECA       186    13                                                       +----------+-------+-------+--------+---------------------------------+--------+  +----------+--------+--------+--------+-------------------+  PSV cm/sEDV cm/sDescribeArm Pressure (mmHG) +----------+--------+--------+--------+-------------------+ [id number]     0                                   +----------+--------+--------+--------+-------------------+  +---------+--------+--+--------+--+ VertebralPSV cm/s82EDV cm/s12 +---------+--------+--+--------+--+        Summary: Right Carotid: Velocities in the right ICA are consistent with a 40-59%                stenosis.  Left Carotid: Velocities in the left ICA are consistent with a 1-39% stenosis.  Vertebrals:  Bilateral vertebral arteries demonstrate antegrade flow. Subclavians: Normal flow hemodynamics were seen in bilateral subclavian              arteries.  *See table(s) above for measurements and observations.    Electronically signed by Leotis Pain MD on 12/28/2020 at 12:38:20 PM.      Final   VAS US RENAL ARTERY DUPLEX ABDOMINAL VISCERAL  Patient Name:  Tamara Finley  Date of Exam:   12/28/2020 Medical Rec #: [id number]       Accession #:    [id number] Date of Birth: [birth year]      Patient Gender: F Patient Age:   72Y Exam Location:  [facility] Procedure:      VAS US RENAL ARTERY DUPLEX Referring Phys: [id number] Erskine Squibb  DEW  --------------------------------------------------------------------------------  Indications: RAS  Comparison Study: 06/01/2020  Performing Technologist: Almira Coaster RVS    Examination Guidelines: A complete evaluation includes B-mode imaging, spectral Doppler, color Doppler, and power Doppler as needed of all accessible portions of each vessel. Bilateral testing is considered an integral part of a complete examination. Limited examinations for reoccurring indications may be performed as noted.    Duplex Findings: +------------+--------+--------+------+--------+ Mesenteric  PSV cm/sEDV cm/sPlaqueComments +------------+--------+--------+------+--------+ Aorta Prox     71      19                  +------------+--------+--------+------+--------+ Aorta Mid      76      28                  +------------+--------+--------+------+--------+ Aorta Distal  273      30                  +------------+--------+--------+------+--------+          +------------------+--------+--------+-------+ Right Renal ArteryPSV cm/sEDV cm/sComment +------------------+--------+--------+-------+ Origin               78      17           +------------------+--------+--------+-------+ Proximal             65      12           +------------------+--------+--------+-------+ Mid                  78      17           +------------------+--------+--------+-------+ Distal               71      16           +------------------+--------+--------+-------+  +-----------------+--------+--------+-------+ Left Renal ArteryPSV cm/sEDV cm/sComment +-----------------+--------+--------+-------+ Origin             110      16           +-----------------+--------+--------+-------+ Proximal  166      30           +-----------------+--------+--------+-------+ Mid                 97      19            +-----------------+--------+--------+-------+ Distal             109      23           +-----------------+--------+--------+-------+  +------------+--------+--------+--+-----------+--------+--------+---+ Right KidneyPSV cm/sEDV cm/sRILeft KidneyPSV cm/sEDV cm/sRI  +------------+--------+--------+--+-----------+--------+--------+---+ Upper Pole                    Upper Pole                     +------------+--------+--------+--+-----------+--------+--------+---+ Mid                           Mid                            +------------+--------+--------+--+-----------+--------+--------+---+ Lower Pole                    Lower Pole                     +------------+--------+--------+--+-----------+--------+--------+---+ Hilar                         Hilar                          +------------+--------+--------+--+-----------+--------+--------+---+  +------------------+----+------------------+-----+ Right Kidney          Left Kidney             +------------------+----+------------------+-----+ RAR                   RAR                     +------------------+----+------------------+-----+ RAR (manual)          RAR (manual)            +------------------+----+------------------+-----+ Cortex                Cortex                  +------------------+----+------------------+-----+ Cortex thickness      Corex thickness         +------------------+----+------------------+-----+ Kidney length (cm)5.62Kidney length (cm)11.40 +------------------+----+------------------+-----+    Summary: Largest Aortic Diameter: 2.5 cm   Renal:   Right: RRV flow present. 1-59% stenosis of the right renal artery.        Abnormal size for the right kidney. RAR could not be        calculated due to elevated Velocities in the Aorta exceeding        100cm/s. Left:  Normal size of left kidney. 1-59% stenosis of the left  renal        artery. LRV flow present. Cyst(s) noted. cyst on kidney        measures 1.54cms x 1.75cms. RAR could not be calculated due        to elevated Velocities in the Aorta exceeding 100cm/s.   *See table(s) above for measurements and observations.   Diagnosing physician: Leotis Pain MD   Electronically signed by Leotis Pain MD on 12/28/2020 at  12:38:14 PM.       Final   Note: Reviewed        Physical Exam  General appearance: Well nourished, well developed, and well hydrated. In no apparent acute distress Mental status: Alert, oriented x 3 (person, place, & time)       Respiratory: No evidence of acute respiratory distress Eyes: PERLA Vitals: BP (!) 152/70   Pulse 69   Temp (!) 96.8 F (36 C) (Temporal)   Resp 16   Ht _0  (1.626 m)   Wt 182 lb (82.6 kg)   SpO2 99%   BMI 31.24 kg/m  BMI: Estimated body mass index is 31.24 kg/m as calculated from the following:   Height as of this encounter: _1  (1.626 m).   Weight as of this encounter: 182 lb (82.6 kg). Ideal: Ideal body weight: 54.7 kg (120 lb 9.5 oz) Adjusted ideal body weight: 65.8 kg (145 lb 2.5 oz)  Cervical Spine Area Exam  Skin & Axial Inspection: No masses, redness, edema, swelling, or associated skin lesions Alignment: Symmetrical Functional ROM: Pain restricted ROM     on the right Stability: No instability detected Muscle Tone/Strength: Functionally intact. No obvious neuro-muscular anomalies detected. Sensory (Neurological): Musculoskeletal pain pattern on the right that, pain with facet loading Palpation: Tender              Lumbar Spine Area Exam  Skin & Axial Inspection: Well healed scar from previous spine surgery detected Alignment: Symmetrical Functional ROM: Decreased ROM       Stability: No instability detected Muscle Tone/Strength: Functionally intact. No obvious neuro-muscular anomalies detected. Sensory (Neurological): Dermatomal pain pattern  Gait & Posture Assessment  Ambulation:  Patient came in today in a wheel chair Gait: Antalgic gait (limping) Posture: Difficulty standing up straight, due to pain  Lower Extremity Exam    Side: Right lower extremity  Side: Left lower extremity  Stability: No instability observed          Stability: No instability observed          Skin & Extremity Inspection: Skin color, temperature, and hair growth are WNL. No peripheral edema or cyanosis. No masses, redness, swelling, asymmetry, or associated skin lesions. No contractures.  Skin & Extremity Inspection: Skin color, temperature, and hair growth are WNL. No peripheral edema or cyanosis. No masses, redness, swelling, asymmetry, or associated skin lesions. No contractures.  Functional ROM: Pain restricted ROM for all joints of the lower extremity Limited SLR (straight leg raise)  Functional ROM: Pain restricted ROM for all joints of the lower extremity Limited SLR (straight leg raise)  Muscle Tone/Strength: Functionally intact. No obvious neuro-muscular anomalies detected.  Muscle Tone/Strength: Functionally intact. No obvious neuro-muscular anomalies detected.  Sensory (Neurological): Dermatomal pain pattern        Sensory (Neurological): Dermatomal pain pattern        DTR: Patellar: deferred today Achilles: deferred today Plantar: deferred today  DTR: Patellar: deferred today Achilles: deferred today Plantar: deferred today  Palpation: No palpable anomalies  Palpation: No palpable anomalies    Assessment   Status Diagnosis  Worsening Persistent Persistent 1. Bilateral neck pain (right)   2. Cervicalgia   3. DDD (degenerative disc disease), cervical   4. Chronic pain syndrome        Plan of Care  Tamara Finley has a current medication list which includes the following long-term medication(s): albuterol, azelastine, calcium 600+d plus minerals, carvedilol, furosemide, levothyroxine, mirtazapine, montelukast, pantoprazole, and gabapentin.  Pharmacotherapy  (  Medications Ordered): Meds ordered this encounter  Medications   HYDROcodone-acetaminophen (NORCO/VICODIN) 5-325 MG tablet    Sig: Take 1 tablet by mouth 2 (two) times daily as needed for moderate pain.    Dispense:  30 tablet    Refill:  0    For chronic pain syndrome   HYDROcodone-acetaminophen (NORCO/VICODIN) 5-325 MG tablet    Sig: Take 1 tablet by mouth 2 (two) times daily as needed for moderate pain.    Dispense:  30 tablet    Refill:  0    For chronic pain syndrome   gabapentin (NEURONTIN) 300 MG capsule    Sig: Take 2 capsules (600 mg total) by mouth at bedtime.    Dispense:  90 capsule    Refill:  0   Orders:  Orders Placed This Encounter  Procedures   DG Cervical Spine With Flex & Extend    Patient presents with axial pain with possible radicular component.  Please evaluate for any evidence of cervical spine instability. Describe the presence of any spondylolisthesis (Antero- or retrolisthesis). If present, provide displacement "Grade" and measurement in cm. Please describe presence and specific location (Level & Laterality) of any signs of  osteoarthritis, zygapophyseal (Facet) joints DJD (including decreased joint space and/or osteophytosis), DDD, Foraminal narrowing, as well as any sclerosis and/or cyst formation. Please comment on ROM. In addition to any acute findings, please report on:  1. Facet (Zygapophyseal) joint DJD (Hypertrophy, space narrowing, subchondral sclerosis, and/or osteophyte formation) 2. DDD and/or IVDD (Loss of disc height, desiccation or "Black disc disease") 3. Pars defects 4. Spondylolisthesis, spondylosis, and/or spondyloarthropathies (include Degree/Grade of displacement in mm) 5. Vertebral body Fractures, including age (old, new/acute) 2. Modic Type Changes 7. Demineralization 8. Bone pathology 9. Central, Lateral Recess, and/or Foraminal Stenosis (include AP diameter of stenosis in mm) 10. Surgical changes (hardware type, status, and  presence of fibrosis) NOTE: Please specify level(s) and laterality. If applicable: Please indicate ROM and/or evidence of instability (>48m displacement between flexion and extension views)    Standing Status:   Future    Standing Expiration Date:   03/14/2021    Scheduling Instructions:     Imaging must be done as soon as possible. Inform patient that order will expire within 30 days and I will not renew it.    Order Specific Question:   Reason for Exam (SYMPTOM  OR DIAGNOSIS REQUIRED)    Answer:   Cervicalgia    Order Specific Question:   Preferred imaging location?    Answer:   [facility]    Order Specific Question:   Call Results- Best Contact Number?    Answer:   [phone][facility]    Order Specific Question:   Radiology Contrast Protocol - do NOT remove file path    Answer:   [email or web address]    Order Specific Question:   Release to patient    Answer:   Immediate   Depending upon x-ray results, will consider diagnostic cervical facet medial branch nerve blocks versus cervical/trapezius TPI  Follow-up plan:   Return in about 3 months (around 05/02/2021) for Medication Management, in person.     consider RFA in future, Sprint PNS of medial branch at L4        Recent Visits Date Type Provider Dept  11/22/20 Office Visit LGillis Santa MD [facility]  Showing recent visits within past 90 days and meeting all other requirements Today's Visits Date Type Provider Dept  02/12/21 Office Visit LGillis Santa MD  [facility]  Showing today's visits and meeting all other requirements Future Appointments Date Type Provider Dept  04/30/21 Appointment Gillis Santa, MD [facility]  Showing future appointments within next 90 days and meeting all other requirements I discussed the assessment and treatment plan with the patient. The patient was provided an opportunity to ask questions and all were  answered. The patient agreed with the plan and demonstrated an understanding of the instructions.  Patient advised to call back or seek an in-person evaluation if the symptoms or condition worsens.  Duration of encounter: 13mnutes.  Note by: BGillis Santa MD Date: 02/12/2021; Time: 9:10 AM

## 2021-03-23 ENCOUNTER — Other Ambulatory Visit: Payer: Self-pay

## 2021-03-23 ENCOUNTER — Ambulatory Visit
Admission: EM | Admit: 2021-03-23 | Discharge: 2021-03-23 | Disposition: A | Discharge status: Home / Self Care | Payer: Medicare HMO | Attending: Sports Medicine | Admitting: Sports Medicine

## 2021-03-23 ENCOUNTER — Encounter: Payer: Self-pay | Admitting: Emergency Medicine

## 2021-03-23 DIAGNOSIS — R059 Cough, unspecified: Secondary | ICD-10-CM

## 2021-03-23 DIAGNOSIS — J069 Acute upper respiratory infection, unspecified: Secondary | ICD-10-CM

## 2021-03-23 DIAGNOSIS — Z20822 Contact with and (suspected) exposure to covid-19: Secondary | ICD-10-CM | POA: Diagnosis not present

## 2021-03-23 DIAGNOSIS — J3489 Other specified disorders of nose and nasal sinuses: Secondary | ICD-10-CM

## 2021-03-23 DIAGNOSIS — R5383 Other fatigue: Secondary | ICD-10-CM

## 2021-03-23 MED ORDER — MOLNUPIRAVIR EUA 200MG CAPSULE: 4.0000 | ORAL_CAPSULE | Freq: Two times a day (BID) | ORAL | 0 refills | Status: AC

## 2021-03-23 NOTE — Discharge Instructions (Addendum)
As we discussed, given that your husband is positive for COVID-19 we will go ahead and treat you as well given all of your comorbidities. Plenty of rest, plenty fluids, Tylenol or Motrin for any fever or discomfort. Please see educational handouts. Please quarantine per the current CDC guidelines. If your symptoms worsen please go to the ER. Please follow-up here or another urgent care since you are transitioning with a new primary care physician if your symptoms persist.

## 2021-03-23 NOTE — ED Triage Notes (Signed)
Patient c/o cough, runny nose and fatigue that started on Wed.  Patient denies recent fevers.  Patient did home covid test on Wed and was negative.

## 2021-03-23 NOTE — ED Provider Notes (Signed)
[facility]    CSN: [id number] Arrival date & time: 03/23/21  1225      History   Chief Complaint Chief Complaint  Patient presents with   Cough   Nasal Congestion    HPI Tamara Finley is a 80 y.o. female.   80 year old female who presents with her husband and her daughter for evaluation of URI symptoms that began 2 days ago.  She reports cough, a little bit of a wheeze, runny nose, fatigue, and a mild sore throat.  She did do a home test 2 days ago that was negative.  She denies any significant fever shakes chills.  No nausea vomiting or diarrhea.  She does have multiple medical issues and is on a lot of medications.  I reviewed her chart in detail.  Complicating her situation is that her husband tested positive in the last 24 hours and he has lymphoma.  She is looking to initiate the antiviral COVID medications orally to protect him.  He has also started that medicine as well.  No significant headache body aches.  No chest pain or shortness of breath.  She does have a history of COPD and has been wheezing.   Past Medical History:  Diagnosis Date   Abdominal aortic aneurysm (AAA) ([place]) 2000's   Age related entropion, unspecified laterality    Anxiety    Anxiety and depression    Bilateral renal artery stenosis (HCC)    Breast calcification, left    Cancer ([place]) 10/20/2016   breast cancer   Carotid artery thrombosis, bilateral    Carotid stenosis    Chronic bronchitis (HCC)    Chronic insomnia    COPD (chronic obstructive pulmonary disease) (HCC)    Coronary artery disease    CRI (chronic renal insufficiency), stage 3 (moderate) (HCC)    Depression    Diabetes mellitus without complication (HCC)    GERD (gastroesophageal reflux disease)    Heart palpitations    History of AAA (abdominal aortic aneurysm) repair    History of colon polyps    History of TIA (transient ischemic attack)    Hypercholesterolemia    Hypertension    Hypothyroidism    Migraine  headache    Osteoarthritis    Osteoporosis    Peripheral arterial disease (HCC)    Pulmonary scarring    Renal disorder    SOB (shortness of breath)    Stenosis of left subclavian artery (HCC)    Stroke (HCC)    Thyroid disease    TIA (transient ischemic attack)    Type 2 diabetes mellitus with vascular disease ([place])     Patient Active Problem List   Diagnosis Date Noted   Cervicalgia 02/12/2021   DDD (degenerative disc disease), cervical 02/12/2021   Chronic pain syndrome 02/12/2021   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/22/2020   Swelling of limb 06/01/2020   Acute kidney failure ([place]) 08/04/2019   Benign essential hypertension 08/04/2019   Lumbar spondylosis 07/21/2019   Lumbar radiculopathy 07/21/2019   Colitis, nonspecific 06/03/2019   Hematochezia 06/03/2019   Age related entropion 04/15/2019   B12 deficiency 03/15/2019   Lumbar facet arthropathy (L4/L5 and L5/S1) 09/28/2018   Controlled substance agreement signed 09/28/2018   Impaired glucose tolerance 12/07/2017   Anemia in stage 3 chronic kidney disease ([place]) 06/01/2017   Renal artery stenosis ([place]) 04/10/2017   Mediastinal adenopathy 11/25/2016   Lung nodule 11/07/2016   Primary cancer of upper inner quadrant of left female breast ([place])  10/20/2016   Hyperlipidemia, unspecified 10/09/2016   Migraine headache 10/09/2016   Osteoarthritis 10/09/2016   ERRONEOUS ENCOUNTER--DISREGARD 10/09/2016   Left breast mass 08/15/2016   Elevated blood sugar 07/08/2016   Adenomatous colon polyp 05/27/2016   SOB (shortness of breath) 04/07/2016   Heart palpitations 04/01/2016   High risk medication use 02/11/2016   Carotid stenosis 12/31/2015   Malignant hypertension 12/06/2015   Renovascular hypertension, 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/2015   Carotid atherosclerosis, bilateral 10/25/2015   History of TIA (transient ischemic attack) 10/25/2015   TIA (transient ischemic attack) 10/17/2015   Stenosis of left subclavian  artery (HCC) 10/15/2015   Low back pain radiating to both legs 08/21/2015   Chronic SI joint pain 08/21/2015   Perennial allergic rhinitis 08/14/2015   Primary osteoarthritis of right hip 10/11/2014   COPD (chronic obstructive pulmonary disease) ([place]) 08/11/2014   CRI (chronic renal insufficiency), stage 3 (moderate) ([place]) 08/11/2014   Breast calcification, left 04/06/2014   Right hip pain 03/14/2014   Anxiety and depression 02/08/2014   Chronic insomnia 02/08/2014   Osteoporosis, postmenopausal 02/08/2014   Peripheral arterial disease ([place]) 02/08/2014   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   Primary localized osteoarthrosis, pelvic region and thigh 11/09/2013   GERD (gastroesophageal reflux disease) 07/18/2013   History of AAA (abdominal aortic aneurysm) repair 07/18/2013   Hypertension 07/18/2013   Hypothyroidism 07/18/2013   Recurrent major depressive disorder, in full remission ([place]) 07/18/2013   Lumbar stenosis 06/01/2013    Past Surgical History:  Procedure Laterality Date   ABDOMINAL AORTIC ANEURYSM REPAIR     BACK SURGERY     BREAST BIOPSY Left 09/03/2016   benign   BREAST BIOPSY Left 09/25/2016   path pending   BREAST EXCISIONAL BIOPSY Left 2010   BREAST EXCISIONAL BIOPSY Left 07/01/2006   BRONCHOSCOPY     CARDIAC SURGERY     CAROTID PTA/STENT INTERVENTION     COLONOSCOPY     COLONOSCOPY WITH PROPOFOL N/A 06/15/2017   Procedure: COLONOSCOPY WITH PROPOFOL;  Surgeon: Lollie Sails, MD;  Location: [facility] ENDOSCOPY;  Service: Endoscopy;  Laterality: N/A;   EYE SURGERY     LAMINECTOMY  07/25/2013   MASTECTOMY     MEDIASTINOSCOPY     PERIPHERAL VASCULAR CATHETERIZATION N/A 12/05/2015   Procedure: Renal Angiography;  Surgeon: Katha Cabal, MD;  Location: [facility];  Service: Cardiovascular;  Laterality: N/A;   PERIPHERAL VASCULAR CATHETERIZATION Left 12/31/2015   Procedure: Carotid PTA/Stent Intervention;  Surgeon: Algernon Huxley,  MD;  Location: [facility];  Service: Cardiovascular;  Laterality: Left;   THORACOSCOPY  12/16/2016    OB History   No obstetric history on file.      Home Medications    Prior to Admission medications   Medication Sig Start Date End Date Taking? Authorizing Provider  molnupiravir EUA 200 mg CAPS Take 4 capsules (800 mg total) by mouth 2 (two) times daily for 5 days. 03/23/21 03/28/21 Yes Verda Cumins, MD  acetaminophen (TYLENOL) 500 MG tablet Take 1,000 mg by mouth every 8 (eight) hours as needed for moderate pain.     [provider]  albuterol (PROVENTIL HFA;VENTOLIN HFA) 108 (90 BASE) MCG/ACT inhaler Inhale 2 puffs into the lungs every 6 (six) hours as needed for wheezing or shortness of breath. Reported on 12/31/2015    [provider]  anastrozole (ARIMIDEX) 1 MG tablet Take 1 mg by mouth daily.  02/17/17   [provider]  aspirin 81  MG chewable tablet Chew 81 mg by mouth daily.    [provider]  atorvastatin (LIPITOR) 40 MG tablet Take 40 mg by mouth daily at 6 PM.  05/27/16   [provider]  azelastine (ASTELIN) 0.1 % nasal spray Place into both nostrils 2 (two) times daily. Use in each nostril as directed    [provider]  Calcium Carbonate-Vit D-Min (CALCIUM 600+D PLUS MINERALS) 600-400 MG-UNIT TABS Take by mouth.    [provider]  carvedilol (COREG) 6.25 MG tablet Take 6.25 mg by mouth 2 (two) times daily with a meal.    [provider]  fluticasone (FLONASE) 50 MCG/ACT nasal spray Place 1-2 sprays into both nostrils daily as needed for rhinitis.    [provider]  furosemide (LASIX) 40 MG tablet Take 1 tablet by mouth 1 day or 1 dose.    [provider]  gabapentin (NEURONTIN) 300 MG capsule Take 2 capsules (600 mg total) by mouth at bedtime. 02/12/21   Gillis Santa, MD  hydrALAZINE (APRESOLINE) 50 MG tablet Take 50 mg by mouth 3 (three) times daily.    [provider]  HYDROcodone-acetaminophen (NORCO/VICODIN) 5-325 MG tablet Take 1 tablet by mouth 2 (two) times daily as needed for moderate pain. 03/09/21 04/08/21  Gillis Santa, MD  HYDROcodone-acetaminophen (NORCO/VICODIN) 5-325 MG tablet Take 1 tablet by mouth 2 (two) times daily as needed for moderate pain. 04/08/21 05/08/21  Gillis Santa, MD  levothyroxine (SYNTHROID, LEVOTHROID) 88 MCG tablet Take 88 mcg by mouth daily before breakfast.    [provider]  mirtazapine (REMERON) 15 MG tablet TAKE 1 TABLET BY MOUTH EVERY DAY AT NIGHT 01/09/18   [provider]  montelukast (SINGULAIR) 10 MG tablet Take 10 mg by mouth at bedtime.    [provider]  pantoprazole (PROTONIX) 40 MG tablet Take 40 mg by mouth daily.    [provider]  potassium chloride SA (K-DUR,KLOR-CON) 20 MEQ tablet Take 20 mEq by mouth as directed.    [provider]  traMADol (ULTRAM-ER) 100 MG 24 hr tablet Take 1 tablet (100 mg total) by mouth daily. Patient not taking: No sig reported 07/31/20   Gillis Santa, MD  vitamin B-12 (CYANOCOBALAMIN) 1000 MCG tablet Take 1 tablet by mouth 1 day or 1 dose.    [provider]    Family History Family History  Problem Relation Age of Onset   CVA Mother    Hypertension Mother    Stroke Mother    Epilepsy Mother    Dementia Father    Stroke Father    Coronary artery disease Father    Hypertension Father    Ovarian cancer Sister    Cancer Brother    Diabetes Brother     Social History Social History   Tobacco Use   Smoking status: Former    Packs/day: 2.00    Years: 50.00    Pack years: 100.00    Types: Cigarettes    Quit date: 06/01/2013    Years since quitting: 7.8   Smokeless tobacco: Never  Vaping Use   Vaping Use: Never used  Substance Use Topics   Alcohol use: No    Alcohol/week: 0.0 standard drinks   Drug use: No     Allergies   Benadryl [diphenhydramine hcl (sleep)] and Alprazolam   Review of Systems Review  of Systems  Constitutional:  Positive for fatigue. Negative for activity change, appetite change, chills, diaphoresis and fever.  HENT:  Positive  for congestion, rhinorrhea and sore throat. Negative for ear pain, postnasal drip, sinus pressure, sinus pain and sneezing.   Eyes:  Negative for pain.  Respiratory:  Positive for cough and wheezing. Negative for chest tightness and shortness of breath.   Cardiovascular:  Negative for chest pain and palpitations.  Gastrointestinal:  Negative for abdominal pain, diarrhea, nausea and vomiting.  Genitourinary:  Negative for dysuria.  Musculoskeletal:  Negative for back pain, myalgias and neck pain.  Skin:  Negative for color change, pallor, rash and wound.  Neurological:  Negative for dizziness, light-headedness and headaches.  All other systems reviewed and are negative.   Physical Exam Triage Vital Signs ED Triage Vitals  Enc Vitals Group     BP 03/23/21 1333 (!) 144/87     Pulse Rate 03/23/21 1333 61     Resp 03/23/21 1333 16     Temp 03/23/21 1333 98.2 F (36.8 C)     Temp Source 03/23/21 1333 Oral     SpO2 03/23/21 1333 94 %     Weight 03/23/21 1330 181 lb (82.1 kg)     Height 03/23/21 1330 '5\' 4"'$  (1.626 m)     Head Circumference --      Peak Flow --      Pain Score 03/23/21 1329 2     Pain Loc --      Pain Edu? --      Excl. in [place]? --    No data found.  Updated Vital Signs BP (!) 144/87 (BP Location: Left Arm)   Pulse 61   Temp 98.2 F (36.8 C) (Oral)   Resp 16   Ht '5\' 4"'$  (1.626 m)   Wt 82.1 kg   SpO2 94%   BMI 31.07 kg/m   Visual Acuity Right Eye Distance:   Left Eye Distance:   Bilateral Distance:    Right Eye Near:   Left Eye Near:    Bilateral Near:     Physical Exam Constitutional:      General: She is not in acute distress.    Appearance: Normal appearance. She is not ill-appearing or toxic-appearing.  HENT:     Head: Normocephalic and atraumatic.     Nose: Congestion present. No rhinorrhea.      Mouth/Throat:     Mouth: Mucous membranes are moist.     Pharynx: No oropharyngeal exudate or posterior oropharyngeal erythema.  Eyes:     General: No scleral icterus.       Right eye: No discharge.        Left eye: No discharge.     Conjunctiva/sclera: Conjunctivae normal.     Pupils: Pupils are equal, round, and reactive to light.  Cardiovascular:     Rate and Rhythm: Normal rate and regular rhythm.     Pulses: Normal pulses.     Heart sounds: Normal heart sounds. No murmur heard.   No friction rub. No gallop.  Pulmonary:     Effort: Pulmonary effort is normal.     Breath sounds: No stridor. Wheezing present. No rhonchi or rales.  Musculoskeletal:     Cervical back: Normal range of motion and neck supple.  Skin:    General: Skin is warm and dry.     Capillary Refill: Capillary refill takes less than 2 seconds.     Coloration: Skin is not jaundiced.     Findings: No rash.  Neurological:     General: No focal deficit present.     Mental Status: She is  alert and oriented to person, place, and time.     UC Treatments / Results  Labs (all labs ordered are listed, but only abnormal results are displayed) Labs Reviewed - No data to display  EKG   Radiology No results found.  Procedures Procedures (including critical care time)  Medications Ordered in UC Medications - No data to display  Initial Impression / Assessment and Plan / UC Course  I have reviewed the triage vital signs and the nursing notes.  Pertinent labs & imaging results that were available during my care of the patient were reviewed by me and considered in my medical decision making (see chart for details).  Clinical impression: 1.  Exposure to a confirmed case of COVID-19 2.  Upper respiratory symptoms 3.  Cough 4.  Rhinorrhea 5.  Fatigue  Treatment plan: 1.  The findings and treatment plan were discussed in detail with the patient, her husband, and her daughter.  All parties were in agreement  voiced verbal understanding. 2.  Given her history, multiple medical issues, COVID exposure, and her husband's lymphoma I will go ahead and treat her with oral antiviral COVID medication. 3.  Educational handouts provided. 4.  Over-the-counter meds, Tylenol or Motrin for any fever or discomfort. 5.  Plenty of rest, plenty of fluids. 6.  Want her to quarantine per current CDC guidelines along with her husband. 7.  If symptoms worsen then she should go to the ER. 8.  She was stable on discharge and will follow-up here as needed.    Final Clinical Impressions(s) / UC Diagnoses   Final diagnoses:  Exposure to confirmed case of COVID-19  Upper respiratory tract infection, unspecified type  Cough  Fatigue, unspecified type  Rhinorrhea     Discharge Instructions      As we discussed, given that your husband is positive for COVID-19 we will go ahead and treat you as well given all of your comorbidities. Plenty of rest, plenty fluids, Tylenol or Motrin for any fever or discomfort. Please see educational handouts. Please quarantine per the current CDC guidelines. If your symptoms worsen please go to the ER. Please follow-up here or another urgent care since you are transitioning with a new primary care physician if your symptoms persist.     ED Prescriptions     Medication Sig Dispense Auth. Provider   molnupiravir EUA 200 mg CAPS Take 4 capsules (800 mg total) by mouth 2 (two) times daily for 5 days. 40 capsule Verda Cumins, MD      PDMP not reviewed this encounter.   Verda Cumins, MD 03/24/21 [phone]

## 2021-03-28 ENCOUNTER — Telehealth: Payer: Self-pay

## 2021-03-28 NOTE — Telephone Encounter (Signed)
Dr. Holley Raring changed to to #30 tabs last visit. I sent a message to him to ask about this.

## 2021-03-28 NOTE — Telephone Encounter (Signed)
Pt need a refill sent to the pharmacy, Hydrocodone 5-325 mg pt states she usually gets 60 day supply but pharmacy only gave her a 30 day supply

## 2021-04-05 DIAGNOSIS — Z113 Encounter for screening for infections with a predominantly sexual mode of transmission: Secondary | ICD-10-CM | POA: Diagnosis not present

## 2021-04-05 DIAGNOSIS — I771 Stricture of artery: Secondary | ICD-10-CM | POA: Diagnosis not present

## 2021-04-05 DIAGNOSIS — R413 Other amnesia: Secondary | ICD-10-CM | POA: Diagnosis not present

## 2021-04-05 DIAGNOSIS — I150 Renovascular hypertension: Secondary | ICD-10-CM | POA: Diagnosis not present

## 2021-04-05 DIAGNOSIS — N1831 Chronic kidney disease, stage 3a: Secondary | ICD-10-CM | POA: Diagnosis not present

## 2021-04-05 DIAGNOSIS — M810 Age-related osteoporosis without current pathological fracture: Secondary | ICD-10-CM | POA: Diagnosis not present

## 2021-04-05 DIAGNOSIS — R739 Hyperglycemia, unspecified: Secondary | ICD-10-CM | POA: Diagnosis not present

## 2021-04-05 DIAGNOSIS — E039 Hypothyroidism, unspecified: Secondary | ICD-10-CM | POA: Diagnosis not present

## 2021-04-05 DIAGNOSIS — Z0000 Encounter for general adult medical examination without abnormal findings: Secondary | ICD-10-CM | POA: Diagnosis not present

## 2021-04-05 DIAGNOSIS — E782 Mixed hyperlipidemia: Secondary | ICD-10-CM | POA: Diagnosis not present

## 2021-04-05 DIAGNOSIS — Z9889 Other specified postprocedural states: Secondary | ICD-10-CM | POA: Diagnosis not present

## 2021-04-05 DIAGNOSIS — D631 Anemia in chronic kidney disease: Secondary | ICD-10-CM | POA: Diagnosis not present

## 2021-04-05 DIAGNOSIS — J410 Simple chronic bronchitis: Secondary | ICD-10-CM | POA: Diagnosis not present

## 2021-04-05 DIAGNOSIS — I739 Peripheral vascular disease, unspecified: Secondary | ICD-10-CM | POA: Diagnosis not present

## 2021-04-08 ENCOUNTER — Other Ambulatory Visit: Payer: Self-pay | Admitting: Internal Medicine

## 2021-04-08 DIAGNOSIS — I150 Renovascular hypertension: Secondary | ICD-10-CM

## 2021-04-08 DIAGNOSIS — R413 Other amnesia: Secondary | ICD-10-CM

## 2021-04-30 ENCOUNTER — Other Ambulatory Visit: Payer: Self-pay

## 2021-04-30 ENCOUNTER — Ambulatory Visit
Payer: Medicare HMO | Attending: Student in an Organized Health Care Education/Training Program | Admitting: Student in an Organized Health Care Education/Training Program

## 2021-04-30 ENCOUNTER — Encounter: Payer: Self-pay | Admitting: Student in an Organized Health Care Education/Training Program

## 2021-04-30 VITALS — BP 198/87 | HR 79 | Temp 97.1°F | Resp 16 | Ht 64.0 in | Wt 184.0 lb

## 2021-04-30 DIAGNOSIS — N1832 Chronic kidney disease, stage 3b: Secondary | ICD-10-CM | POA: Diagnosis not present

## 2021-04-30 DIAGNOSIS — M542 Cervicalgia: Secondary | ICD-10-CM | POA: Diagnosis not present

## 2021-04-30 DIAGNOSIS — R062 Wheezing: Secondary | ICD-10-CM | POA: Diagnosis not present

## 2021-04-30 DIAGNOSIS — G5703 Lesion of sciatic nerve, bilateral lower limbs: Secondary | ICD-10-CM | POA: Diagnosis not present

## 2021-04-30 DIAGNOSIS — M533 Sacrococcygeal disorders, not elsewhere classified: Secondary | ICD-10-CM | POA: Insufficient documentation

## 2021-04-30 DIAGNOSIS — G894 Chronic pain syndrome: Secondary | ICD-10-CM | POA: Diagnosis not present

## 2021-04-30 DIAGNOSIS — M47816 Spondylosis without myelopathy or radiculopathy, lumbar region: Secondary | ICD-10-CM | POA: Insufficient documentation

## 2021-04-30 DIAGNOSIS — I701 Atherosclerosis of renal artery: Secondary | ICD-10-CM | POA: Diagnosis not present

## 2021-04-30 DIAGNOSIS — R600 Localized edema: Secondary | ICD-10-CM | POA: Diagnosis not present

## 2021-04-30 DIAGNOSIS — G8929 Other chronic pain: Secondary | ICD-10-CM | POA: Insufficient documentation

## 2021-04-30 DIAGNOSIS — R809 Proteinuria, unspecified: Secondary | ICD-10-CM | POA: Diagnosis not present

## 2021-04-30 DIAGNOSIS — D631 Anemia in chronic kidney disease: Secondary | ICD-10-CM | POA: Diagnosis not present

## 2021-04-30 MED ORDER — HYDROCODONE-ACETAMINOPHEN 5-325 MG PO TABS: 1.0000 | ORAL_TABLET | Freq: Two times a day (BID) | ORAL | 0 refills | Status: AC | PRN

## 2021-04-30 NOTE — Patient Instructions (Signed)

## 2021-04-30 NOTE — Progress Notes (Signed)
Nursing Pain Medication Assessment:  Safety precautions to be maintained throughout the outpatient stay will include: orient to surroundings, keep bed in low position, maintain call bell within reach at all times, provide assistance with transfer out of bed and ambulation.  Medication Inspection Compliance: Pill count conducted under aseptic conditions, in front of the patient. Neither the pills nor the bottle was removed from the patient's sight at any time. Once count was completed pills were immediately returned to the patient in their original bottle.  Medication: Hydrocodone/APAP Pill/Patch Count:  2 of 30 pills remain Pill/Patch Appearance: Markings consistent with prescribed medication Bottle Appearance: Standard pharmacy container. Clearly labeled. Filled Date: 8 / 8 / 2022 Last Medication intake:  Yesterday

## 2021-04-30 NOTE — Progress Notes (Signed)
PROVIDER NOTE: Information contained herein reflects review and annotations entered in association with encounter. Interpretation of such information and data should be left to medically-trained personnel. Information provided to patient can be located elsewhere in the medical record under "Patient Instructions". Document created using STT-dictation technology, any transcriptional errors that may result from process are unintentional.    Patient: Tamara Finley  Service Category: E/M  Provider: Gillis Santa, MD  DOB: 16-Sep-1940  DOS: 04/30/2021  Specialty: Interventional Pain Management  MRN: [id number]  Setting: Ambulatory outpatient  PCP: Ezequiel Kayser, MD (Inactive)  Type: Established Patient    Referring Provider: Ezequiel Kayser, MD  Location: Office  Delivery: Face-to-face     HPI  Tamara Finley, a 80 y.o. year old female, is here today because of her Chronic SI joint pain [M53.3, G89.29]. [name] primary complain today is Back Pain (lower) Last encounter: My last encounter with her was on 02/12/2021. Pertinent problems: Tamara Finley has Low back pain radiating to both legs; Peripheral arterial disease ([place]); Right hip pain; Chronic SI joint pain; Lumbar facet arthropathy (L4/L5 and L5/S1); Controlled substance agreement signed; Lumbar spondylosis; and Lumbar radiculopathy on their pertinent problem list. Pain Assessment: Severity of Chronic pain is reported as a 8 /10. Location: Back Lower, Right, Left/through hips down both legs to ankles - right side is worse "I'm more mobile on my left side". Onset: More than a month ago. Quality: Dull, Aching. Timing: Constant. Modifying factor(s): meds. Vitals:  height is 5' 4"  (1.626 m) and weight is 184 lb (83.5 kg). Her temporal temperature is 97.1 F (36.2 C) (abnormal). Her blood pressure is 198/87 (abnormal) and her pulse is 79. Her respiration is 16 and oxygen saturation is 97%.   Reason for encounter:   Patient presents today for medication  management as well as increased right-sided neck pain that is radiating up to her posterior occiput and to her ear.  She states that she has been noticing her head shaking.  No vision changes.  No upper extremity weakness.  No headaches.  She is also having increased buttock pain related to SI joint arthritis and piriformis syndrome.  She is status post sacroiliac joint injections in 2020 that did provide her with symptomatic pain relief by approximately 50% for 2 to 3 weeks.  She has not had any imaging of her lower lumbar spine or SI joints in the last 2 years.  Given increased pain over the last month which has not improved, recommend repeating lumbar spine x-ray, SI joint x-ray.  We also discussed therapeutic sacroiliac joint injection and piriformis injection.  Risk and benefits reviewed and patient would like to proceed.   Pharmacotherapy Assessment  Analgesic: Hydrocodone 5 mg 3 times daily prn, quantity 75/month    Monitoring: [place] PMP: PDMP reviewed during this encounter.       Pharmacotherapy: No side-effects or adverse reactions reported. Compliance: No problems identified. Effectiveness: Clinically acceptable.  Rise Patience, RN  04/30/2021 10:45 AM  Sign when Signing Visit Nursing Pain Medication Assessment:  Safety precautions to be maintained throughout the outpatient stay will include: orient to surroundings, keep bed in low position, maintain call bell within reach at all times, provide assistance with transfer out of bed and ambulation.  Medication Inspection Compliance: Pill count conducted under aseptic conditions, in front of the patient. Neither the pills nor the bottle was removed from the patient's sight at any time. Once count was completed pills were immediately returned to the patient in their  original bottle.  Medication: Hydrocodone/APAP Pill/Patch Count:  2 of 30 pills remain Pill/Patch Appearance: Markings consistent with prescribed medication Bottle Appearance:  Standard pharmacy container. Clearly labeled. Filled Date: 8 / 8 / 2022 Last Medication intake:  Yesterday      UDS:  Summary  Date Value Ref Range Status  05/08/2020 Note  Final    Comment:    ==================================================================== [employer] Select 13 (MW) ==================================================================== Test                             Result       Flag       Units  Drug Present and Declared for Prescription Verification   Hydrocodone                    2497         EXPECTED   ng/mg creat   Hydromorphone                  468          EXPECTED   ng/mg creat   Dihydrocodeine                 311          EXPECTED   ng/mg creat   Norhydrocodone                 941          EXPECTED   ng/mg creat    Sources of hydrocodone include scheduled prescription medications.    Hydromorphone, dihydrocodeine and norhydrocodone are expected    metabolites of hydrocodone. Hydromorphone and dihydrocodeine are    also available as scheduled prescription medications.  ==================================================================== Test                      Result    Flag   Units      Ref Range   Creatinine              37               mg/dL      >=20 ==================================================================== Declared Medications:  The flagging and interpretation on this report are based on the  following declared medications.  Unexpected results may arise from  inaccuracies in the declared medications.   **Note: The testing scope of this panel includes these medications:   Hydrocodone   **Note: The testing scope of this panel does not include the  following reported medications:   Acetaminophen  Albuterol  Anastrozole (Arimidex)  Aspirin  Atorvastatin  Calcium  Carvedilol (Coreg)  Cyanocobalamin  Fluticasone (Flonase)  Furosemide  Gabapentin  Hydralazine  Levothyroxine  Mirtazapine (Remeron)  Pantoprazole   Potassium (Klor-Con)  Vitamin D ==================================================================== For clinical consultation, please call [phone]. ====================================================================      ROS  Constitutional: Denies any fever or chills Gastrointestinal: No reported hemesis, hematochezia, vomiting, or acute GI distress Musculoskeletal:  Neck pain, occipital pain, low back pain, piriformis pain, SI joint pain Neurological: No reported episodes of acute onset apraxia, aphasia, dysarthria, agnosia, amnesia, paralysis, loss of coordination, or loss of consciousness  Medication Review  Calcium 600+D Plus Minerals, HYDROcodone-acetaminophen, acetaminophen, albuterol, anastrozole, aspirin, atorvastatin, azelastine, carvedilol, fluticasone, furosemide, gabapentin, hydrALAZINE, levothyroxine, mirtazapine, montelukast, pantoprazole, potassium chloride SA, and vitamin B-12  History Review  Allergy: Ms. Parrales is allergic to benadryl [diphenhydramine hcl (sleep)] and alprazolam. Drug: Ms. Bettes  reports  no history of drug use. Alcohol:  reports no history of alcohol use. Tobacco:  reports that she quit smoking about 7 years ago. Her smoking use included cigarettes. She has a 100.00 pack-year smoking history. She has never used smokeless tobacco. Social: Ms. Millward  reports that she quit smoking about 7 years ago. Her smoking use included cigarettes. She has a 100.00 pack-year smoking history. She has never used smokeless tobacco. She reports that she does not drink alcohol and does not use drugs. Medical:  has a past medical history of Abdominal aortic aneurysm (AAA) ([place]) (2000's), Age related entropion, unspecified laterality, Anxiety, Anxiety and depression, Bilateral renal artery stenosis ([place]), Breast calcification, left, Cancer ([place]) (10/20/2016), Carotid artery thrombosis, bilateral, Carotid stenosis, Chronic bronchitis ([facility]), Chronic insomnia, COPD  (chronic obstructive pulmonary disease) ([place]), Coronary artery disease, CRI (chronic renal insufficiency), stage 3 (moderate) ([place]), Depression, Diabetes mellitus without complication ([place]), GERD (gastroesophageal reflux disease), Heart palpitations, History of AAA (abdominal aortic aneurysm) repair, History of colon polyps, History of TIA (transient ischemic attack), Hypercholesterolemia, Hypertension, Hypothyroidism, Migraine headache, Osteoarthritis, Osteoporosis, Peripheral arterial disease ([place]), Pulmonary scarring, Renal disorder, SOB (shortness of breath), Stenosis of left subclavian artery ([place]), Stroke ([place]), Thyroid disease, TIA (transient ischemic attack), and Type 2 diabetes mellitus with vascular disease ([place]). Surgical: Ms. Lippman  has a past surgical history that includes Cardiac surgery; Abdominal aortic aneurysm repair; Cardiac catheterization (N/A, 12/05/2015); Cardiac catheterization (Left, 12/31/2015); Breast excisional biopsy (Left, 2010); Breast excisional biopsy (Left, 07/01/2006); Breast biopsy (Left, 09/03/2016); Breast biopsy (Left, 09/25/2016); Bronchoscopy; CAROTID PTA/STENT INTERVENTION; Colonoscopy; Eye surgery; Laminectomy (07/25/2013); Back surgery; Mediastinoscopy; Mastectomy; Thoracoscopy (12/16/2016); and Colonoscopy with propofol (N/A, 06/15/2017). Family: family history includes CVA in her mother; Cancer in her brother; Coronary artery disease in her father; Dementia in her father; Diabetes in her brother; Epilepsy in her mother; Hypertension in her father and mother; Ovarian cancer in her sister; Stroke in her father and mother.  Laboratory Chemistry Profile   Renal Lab Results  Component Value Date   BUN 9 01/01/2016   CREATININE 1.03 (H) 01/01/2016   GFRAA >60 01/01/2016   GFRNONAA 53 (L) 01/01/2016    Hepatic Lab Results  Component Value Date   AST 28 10/17/2015   ALT 17 10/17/2015   ALBUMIN 3.7 10/17/2015   ALKPHOS 64 10/17/2015    Electrolytes Lab Results   Component Value Date   NA 139 01/01/2016   K 3.5 01/01/2016   CL 106 01/01/2016   CALCIUM 8.2 (L) 01/01/2016    Bone No results found for: VD25OH, [id number], BF3832NV9, TY6060OK5, 25OHVITD1, 25OHVITD2, 25OHVITD3, TESTOFREE, TESTOSTERONE  Inflammation (CRP: Acute Phase) (ESR: Chronic Phase) Lab Results  Component Value Date   ESRSEDRATE 23 10/12/2015         Note: Above Lab results reviewed.  Recent Imaging Review  VAS US CAROTID Carotid Arterial Duplex Study  Patient Name:  LANIESHA DAS  Date of Exam:   12/28/2020 Medical Rec #: [id number]       Accession #:    [id number] Date of Birth: [birth year]      Patient Gender: F Patient Age:   80Y Exam Location:  [facility] Procedure:      VAS US CAROTID Referring Phys: [id number] [place]  --------------------------------------------------------------------------------   Indications:       Carotid artery disease. Comparison Study:  06/01/2020: Incomparison to prior study, Velocities appear to  have decreased in the Right ICA;40-59%.  Performing Technologist: Almira Coaster RVS    Examination Guidelines: A complete evaluation includes B-mode imaging, spectral Doppler, color Doppler, and power Doppler as needed of all accessible portions of each vessel. Bilateral testing is considered an integral part of a complete examination. Limited examinations for reoccurring indications may be performed as noted.    Right Carotid Findings: +----------+-------+-------+--------+---------------------------------+--------+           PSV    EDV    StenosisPlaque Description               Comments           cm/s   cm/s                                                     +----------+-------+-------+--------+---------------------------------+--------+ CCA Prox  67     14                                                        +----------+-------+-------+--------+---------------------------------+--------+ CCA Mid   93     27             heterogenous, irregular and                                               calcific                                  +----------+-------+-------+--------+---------------------------------+--------+ CCA Distal108    26             heterogenous, irregular and                                               calcific                                  +----------+-------+-------+--------+---------------------------------+--------+ ICA Prox  164    35                                                       +----------+-------+-------+--------+---------------------------------+--------+ ICA Mid   102    25                                                       +----------+-------+-------+--------+---------------------------------+--------+ ICA Distal114    32                                                       +----------+-------+-------+--------+---------------------------------+--------+  ECA       204    13                                                       +----------+-------+-------+--------+---------------------------------+--------+  +----------+--------+-------+--------+-------------------+           PSV cm/sEDV cmsDescribeArm Pressure (mmHG) +----------+--------+-------+--------+-------------------+ Subclavian69      0                                  +----------+--------+-------+--------+-------------------+  +---------+--------+---+--------+--+ VertebralPSV cm/s108EDV cm/s24 +---------+--------+---+--------+--+     Left Carotid Findings: +----------+-------+-------+--------+---------------------------------+--------+           PSV    EDV    StenosisPlaque Description               Comments           cm/s   cm/s                                                      +----------+-------+-------+--------+---------------------------------+--------+ CCA Prox  69     19                                                       +----------+-------+-------+--------+---------------------------------+--------+ CCA Mid   57     18             heterogenous, irregular and                                               calcific                                  +----------+-------+-------+--------+---------------------------------+--------+ CCA Distal66     21                                                       +----------+-------+-------+--------+---------------------------------+--------+ ICA Prox  88     26                                                       +----------+-------+-------+--------+---------------------------------+--------+ ICA Mid   87     19                                                       +----------+-------+-------+--------+---------------------------------+--------+  ICA Distal106    25                                                       +----------+-------+-------+--------+---------------------------------+--------+ ECA       186    13                                                       +----------+-------+-------+--------+---------------------------------+--------+  +----------+--------+--------+--------+-------------------+           PSV cm/sEDV cm/sDescribeArm Pressure (mmHG) +----------+--------+--------+--------+-------------------+ [id number]     0                                   +----------+--------+--------+--------+-------------------+  +---------+--------+--+--------+--+ VertebralPSV cm/s82EDV cm/s12 +---------+--------+--+--------+--+        Summary: Right Carotid: Velocities in the right ICA are consistent with a 40-59%                stenosis.  Left Carotid: Velocities in the left ICA are consistent with a 1-39%  stenosis.  Vertebrals:  Bilateral vertebral arteries demonstrate antegrade flow. Subclavians: Normal flow hemodynamics were seen in bilateral subclavian              arteries.  *See table(s) above for measurements and observations.    Electronically signed by Leotis Pain MD on 12/28/2020 at 12:38:20 PM.      Final   VAS US RENAL ARTERY DUPLEX ABDOMINAL VISCERAL  Patient Name:  LOYS HOSELTON  Date of Exam:   12/28/2020 Medical Rec #: [id number]       Accession #:    [id number] Date of Birth: 08-07-1941      Patient Gender: F Patient Age:   53Y Exam Location:  [facility] Procedure:      VAS US RENAL ARTERY DUPLEX Referring Phys: [id number] Erskine Squibb DEW  --------------------------------------------------------------------------------  Indications: RAS  Comparison Study: 06/01/2020  Performing Technologist: Almira Coaster RVS    Examination Guidelines: A complete evaluation includes B-mode imaging, spectral Doppler, color Doppler, and power Doppler as needed of all accessible portions of each vessel. Bilateral testing is considered an integral part of a complete examination. Limited examinations for reoccurring indications may be performed as noted.    Duplex Findings: +------------+--------+--------+------+--------+ Mesenteric  PSV cm/sEDV cm/sPlaqueComments +------------+--------+--------+------+--------+ Aorta Prox     71      19                  +------------+--------+--------+------+--------+ Aorta Mid      76      28                  +------------+--------+--------+------+--------+ Aorta Distal  273      30                  +------------+--------+--------+------+--------+          +------------------+--------+--------+-------+ Right Renal ArteryPSV cm/sEDV cm/sComment +------------------+--------+--------+-------+ Origin               78      17           +------------------+--------+--------+-------+ Proximal  65      12           +------------------+--------+--------+-------+ Mid                  78      17           +------------------+--------+--------+-------+ Distal               71      16           +------------------+--------+--------+-------+  +-----------------+--------+--------+-------+ Left Renal ArteryPSV cm/sEDV cm/sComment +-----------------+--------+--------+-------+ Origin             110      16           +-----------------+--------+--------+-------+ Proximal           166      30           +-----------------+--------+--------+-------+ Mid                 97      19           +-----------------+--------+--------+-------+ Distal             109      23           +-----------------+--------+--------+-------+  +------------+--------+--------+--+-----------+--------+--------+---+ Right KidneyPSV cm/sEDV cm/sRILeft KidneyPSV cm/sEDV cm/sRI  +------------+--------+--------+--+-----------+--------+--------+---+ Upper Pole                    Upper Pole                     +------------+--------+--------+--+-----------+--------+--------+---+ Mid                           Mid                            +------------+--------+--------+--+-----------+--------+--------+---+ Lower Pole                    Lower Pole                     +------------+--------+--------+--+-----------+--------+--------+---+ Hilar                         Hilar                          +------------+--------+--------+--+-----------+--------+--------+---+  +------------------+----+------------------+-----+ Right Kidney          Left Kidney             +------------------+----+------------------+-----+ RAR                   RAR                     +------------------+----+------------------+-----+ RAR (manual)          RAR (manual)             +------------------+----+------------------+-----+ Cortex                Cortex                  +------------------+----+------------------+-----+ Cortex thickness      Corex thickness         +------------------+----+------------------+-----+ Kidney length (cm)5.62Kidney length (cm)11.40 +------------------+----+------------------+-----+    Summary: Largest Aortic Diameter: 2.5 cm   Renal:   Right: RRV flow present.  1-59% stenosis of the right renal artery.        Abnormal size for the right kidney. RAR could not be        calculated due to elevated Velocities in the Aorta exceeding        100cm/s. Left:  Normal size of left kidney. 1-59% stenosis of the left renal        artery. LRV flow present. Cyst(s) noted. cyst on kidney        measures 1.54cms x 1.75cms. RAR could not be calculated due        to elevated Velocities in the Aorta exceeding 100cm/s.   *See table(s) above for measurements and observations.   Diagnosing physician: Leotis Pain MD   Electronically signed by Leotis Pain MD on 12/28/2020 at 12:38:14 PM.       Final   Note: Reviewed        Physical Exam  General appearance: Well nourished, well developed, and well hydrated. In no apparent acute distress Mental status: Alert, oriented x 3 (person, place, & time)       Respiratory: No evidence of acute respiratory distress Eyes: PERLA Vitals: BP (!) 198/87 (BP Location: Right Arm, Patient Position: Sitting, Cuff Size: Normal)   Pulse 79   Temp (!) 97.1 F (36.2 C) (Temporal)   Resp 16   Ht 5' 4"  (1.626 m)   Wt 184 lb (83.5 kg)   SpO2 97%   BMI 31.58 kg/m  BMI: Estimated body mass index is 31.58 kg/m as calculated from the following:   Height as of this encounter: 5' 4"  (1.626 m).   Weight as of this encounter: 184 lb (83.5 kg). Ideal: Ideal body weight: 54.7 kg (120 lb 9.5 oz) Adjusted ideal body weight: 66.2 kg (145 lb 15.3 oz)  Cervical Spine Area Exam  Skin & Axial Inspection: No  masses, redness, edema, swelling, or associated skin lesions Alignment: Symmetrical Functional ROM: Pain restricted ROM      Stability: No instability detected Muscle Tone/Strength: Functionally intact. No obvious neuro-muscular anomalies detected. Sensory (Neurological): Musculoskeletal pain pattern Palpation: Complains of area being tender to palpation  Lumbar Spine Area Exam  Skin & Axial Inspection: No masses, redness, or swelling Alignment: Symmetrical Functional ROM: Decreased ROM       Stability: No instability detected Muscle Tone/Strength: Functionally intact. No obvious neuro-muscular anomalies detected. Sensory (Neurological): Musculoskeletal pain pattern  Positive Patrick's bilaterally Positive Faber's bilaterally Tender to palpation overlying piriformis and sacroiliac joint bilaterally, left greater than right.  Gait & Posture Assessment  Ambulation: Unassisted Gait: Relatively normal for age and body habitus Posture: WNL  Lower Extremity Exam    Side: Right lower extremity  Side: Left lower extremity  Stability: No instability observed          Stability: No instability observed          Skin & Extremity Inspection: Skin color, temperature, and hair growth are WNL. No peripheral edema or cyanosis. No masses, redness, swelling, asymmetry, or associated skin lesions. No contractures.  Skin & Extremity Inspection: Skin color, temperature, and hair growth are WNL. No peripheral edema or cyanosis. No masses, redness, swelling, asymmetry, or associated skin lesions. No contractures.  Functional ROM: Pain restricted ROM to hip joint                  Functional ROM: Pain restricted ROM        hip joint          Muscle Tone/Strength:  Functionally intact. No obvious neuro-muscular anomalies detected.  Muscle Tone/Strength: Functionally intact. No obvious neuro-muscular anomalies detected.  Sensory (Neurological): Neurogenic pain pattern        Sensory (Neurological): Neurogenic  pain pattern        DTR: Patellar: deferred today Achilles: deferred today Plantar: deferred today  DTR: Patellar: deferred today Achilles: deferred today Plantar: deferred today  Palpation: No palpable anomalies  Palpation: No palpable anomalies     Assessment   Status Diagnosis  Having a Flare-up Having a Flare-up Having a Flare-up 1. Chronic SI joint pain   2. Piriformis syndrome of both sides   3. Cervicalgia   4. Bilateral neck pain (right)   5. Lumbar spondylosis   6. Lumbar facet arthropathy (L4/L5 and L5/S1)   7. Chronic pain syndrome      Updated Problems: Problem  Piriformis Syndrome of Both Sides  Bilateral Neck Pain    Plan of Care    Ms. MIDORI DADO has a current medication list which includes the following long-term medication(s): albuterol, azelastine, calcium 600+d plus minerals, carvedilol, furosemide, gabapentin, levothyroxine, mirtazapine, montelukast, and pantoprazole.   1. Chronic SI joint pain - DG Si Joints; Future - SACROILIAC JOINT INJECTION; Future  2. Piriformis syndrome of both sides - DG Si Joints; Future - TRIGGER POINT INJECTION; Future  3. Cervicalgia - DG Cervical Spine Complete; Future  4. Bilateral neck pain (right) - DG Cervical Spine Complete; Future  5. Lumbar spondylosis - DG Lumbar Spine Complete W/Bend; Future  6. Lumbar facet arthropathy (L4/L5 and L5/S1) - DG Lumbar Spine Complete W/Bend; Future  7. Chronic pain syndrome - DG Lumbar Spine Complete W/Bend; Future - DG Si Joints; Future - DG Cervical Spine Complete; Future - SACROILIAC JOINT INJECTION; Future - TRIGGER POINT INJECTION; Future - HYDROcodone-acetaminophen (NORCO/VICODIN) 5-325 MG tablet; Take 1 tablet by mouth 2 (two) times daily as needed for moderate pain.  Dispense: 60 tablet; Refill: 0 - HYDROcodone-acetaminophen (NORCO/VICODIN) 5-325 MG tablet; Take 1 tablet by mouth 2 (two) times daily as needed for moderate pain.  Dispense: 60 tablet;  Refill: 0 - HYDROcodone-acetaminophen (NORCO/VICODIN) 5-325 MG tablet; Take 1 tablet by mouth 2 (two) times daily as needed for moderate pain.  Dispense: 60 tablet; Refill: 0 - ToxASSURE Select 13 (MW), Urine   Pharmacotherapy (Medications Ordered): Meds ordered this encounter  Medications   HYDROcodone-acetaminophen (NORCO/VICODIN) 5-325 MG tablet    Sig: Take 1 tablet by mouth 2 (two) times daily as needed for moderate pain.    Dispense:  60 tablet    Refill:  0    For chronic pain syndrome   HYDROcodone-acetaminophen (NORCO/VICODIN) 5-325 MG tablet    Sig: Take 1 tablet by mouth 2 (two) times daily as needed for moderate pain.    Dispense:  60 tablet    Refill:  0    For chronic pain syndrome   HYDROcodone-acetaminophen (NORCO/VICODIN) 5-325 MG tablet    Sig: Take 1 tablet by mouth 2 (two) times daily as needed for moderate pain.    Dispense:  60 tablet    Refill:  0    For chronic pain syndrome   Orders:  Orders Placed This Encounter  Procedures   SACROILIAC JOINT INJECTION    Standing Status:   Future    Standing Expiration Date:   05/31/2021    Scheduling Instructions:     Side: Bilateral     Timeframe: ASAP    Order Specific Question:   Where will this procedure be  performed?    Answer:   [facility]   TRIGGER POINT INJECTION    Area: Buttocks region (gluteal area) Indications: Piriformis muscle pain;  bilateral piriformis-syndrome; piriformis muscle spasms (R15.400). CPT code: 20552    Standing Status:   Future    Standing Expiration Date:   10/29/2021    Scheduling Instructions:     Type: Myoneural block (TPI) of piriformis muscle.     Side:  bilateral    Order Specific Question:   Where will this procedure be performed?    Answer:   [facility]   DG Lumbar Spine Complete W/Bend    Patient presents with axial pain with possible radicular component. Please assist [place] in identifying specific level(s) and laterality of any additional findings such  as: 1. Facet (Zygapophyseal) joint DJD (Hypertrophy, space narrowing, subchondral sclerosis, and/or osteophyte formation) 2. DDD and/or IVDD (Loss of disc height, desiccation, gas patterns, osteophytes, endplate sclerosis, or "Black disc disease") 3. Pars defects 4. Spondylolisthesis, spondylosis, and/or spondyloarthropathies (include Degree/Grade of displacement in mm) (stability) 5. Vertebral body Fractures (acute/chronic) (state percentage of collapse) 6. Demineralization (osteopenia/osteoporotic) 7. Bone pathology 8. Foraminal narrowing  9. Surgical changes    Standing Status:   Future    Standing Expiration Date:   05/31/2021    Scheduling Instructions:     Imaging must be done as soon as possible. Inform patient that order will expire within 30 days and I will not renew it.    Order Specific Question:   Reason for Exam (SYMPTOM  OR DIAGNOSIS REQUIRED)    Answer:   Low back pain    Order Specific Question:   Preferred imaging location?    Answer:   [facility]    Order Specific Question:   Call Results- Best Contact Number?    Answer:   [phone] ([facility])    Order Specific Question:   Radiology Contrast Protocol - do NOT remove file path    Answer:   [email or web address]    Order Specific Question:   Release to patient    Answer:   Immediate   DG Si Joints    Standing Status:   Future    Standing Expiration Date:   05/31/2021    Scheduling Instructions:     Imaging must be done as soon as possible. Inform patient that order will expire within 30 days and I will not renew it.    Order Specific Question:   Reason for Exam (SYMPTOM  OR DIAGNOSIS REQUIRED)    Answer:   Sacroiliac joint pain    Order Specific Question:   Preferred imaging location?    Answer:   [facility]    Order Specific Question:   Call Results- Best Contact Number?    Answer:   [phone] ([facility])    Order Specific Question:   Release  to patient    Answer:   Immediate   DG Cervical Spine Complete    Patient presents with axial pain with possible radicular component. Please assist [place] in identifying specific level(s) and laterality of any additional findings such as: 1. Facet (Zygapophyseal) joint DJD (Hypertrophy, space narrowing, subchondral sclerosis, and/or osteophyte formation) 2. DDD and/or IVDD (Loss of disc height, desiccation, gas patterns, osteophytes, endplate sclerosis, or "Black disc disease") 3. Pars defects 4. Spondylolisthesis, spondylosis, and/or spondyloarthropathies (include Degree/Grade of displacement in mm) (stability) 5. Vertebral body Fractures (acute/chronic) (state percentage of collapse) 6. Demineralization (osteopenia/osteoporotic) 7. Bone pathology 8. Foraminal narrowing  9.  Surgical changes    Standing Status:   Future    Standing Expiration Date:   07/31/2021    Scheduling Instructions:     Please contact patient and remind her that the order has a limited expiration date and that radiology needs time to read the study.    Order Specific Question:   Reason for Exam (SYMPTOM  OR DIAGNOSIS REQUIRED)    Answer:   Cervicalgia    Order Specific Question:   Preferred imaging location?    Answer:   [facility]    Order Specific Question:   Call Results- Best Contact Number?    Answer:   [phone]   ToxASSURE Select 13 (MW), Urine    Volume: 30 ml(s). Minimum 3 ml of urine is needed. Document temperature of fresh sample. Indications: Long term (current) use of opiate analgesic [phone])    Order Specific Question:   Release to patient    Answer:   Immediate   Follow-up plan:   Return in about 3 weeks (around 05/21/2021) for B/L SI-J, Piriformis , without sedation.     consider RFA in future, Sprint PNS of medial branch at L4         Recent Visits Date Type Provider Dept  02/12/21 Office Visit Gillis Santa, MD [facility]  Showing recent visits within past 90 days and  meeting all other requirements Today's Visits Date Type Provider Dept  04/30/21 Office Visit Gillis Santa, MD [facility]  Showing today's visits and meeting all other requirements Future Appointments No visits were found meeting these conditions. Showing future appointments within next 90 days and meeting all other requirements I discussed the assessment and treatment plan with the patient. The patient was provided an opportunity to ask questions and all were answered. The patient agreed with the plan and demonstrated an understanding of the instructions.  Patient advised to call back or seek an in-person evaluation if the symptoms or condition worsens.  Duration of encounter: 80mnutes.  Note by: BGillis Santa MD Date: 04/30/2021; Time: 11:20 AM

## 2021-05-04 LAB — TOXASSURE SELECT 13 (MW), URINE

## 2021-05-14 ENCOUNTER — Ambulatory Visit
Admission: RE | Admit: 2021-05-14 | Discharge: 2021-05-14 | Disposition: A | Discharge status: Home / Self Care | Payer: Medicare HMO | Source: Ambulatory Visit | Attending: Internal Medicine | Admitting: Internal Medicine

## 2021-05-14 ENCOUNTER — Other Ambulatory Visit: Payer: Self-pay

## 2021-05-14 DIAGNOSIS — R413 Other amnesia: Secondary | ICD-10-CM | POA: Diagnosis not present

## 2021-05-14 DIAGNOSIS — I150 Renovascular hypertension: Secondary | ICD-10-CM | POA: Diagnosis not present

## 2021-05-14 DIAGNOSIS — R519 Headache, unspecified: Secondary | ICD-10-CM | POA: Diagnosis not present

## 2021-05-27 ENCOUNTER — Other Ambulatory Visit: Payer: Self-pay | Admitting: Student in an Organized Health Care Education/Training Program

## 2021-06-05 ENCOUNTER — Ambulatory Visit: Payer: Medicare HMO | Admitting: Student in an Organized Health Care Education/Training Program

## 2021-06-10 ENCOUNTER — Ambulatory Visit
Admission: RE | Admit: 2021-06-10 | Discharge: 2021-06-10 | Disposition: A | Discharge status: Home / Self Care | Payer: Medicare HMO | Attending: Student in an Organized Health Care Education/Training Program | Admitting: Student in an Organized Health Care Education/Training Program

## 2021-06-10 ENCOUNTER — Ambulatory Visit
Admission: RE | Admit: 2021-06-10 | Discharge: 2021-06-10 | Disposition: A | Discharge status: Home / Self Care | Payer: Medicare HMO | Source: Ambulatory Visit | Attending: Student in an Organized Health Care Education/Training Program | Admitting: Student in an Organized Health Care Education/Training Program

## 2021-06-10 DIAGNOSIS — M542 Cervicalgia: Secondary | ICD-10-CM | POA: Diagnosis not present

## 2021-06-10 DIAGNOSIS — G894 Chronic pain syndrome: Secondary | ICD-10-CM

## 2021-06-12 ENCOUNTER — Encounter: Payer: Self-pay | Admitting: Student in an Organized Health Care Education/Training Program

## 2021-06-12 ENCOUNTER — Ambulatory Visit
Admission: RE | Admit: 2021-06-12 | Discharge: 2021-06-12 | Disposition: A | Discharge status: Home / Self Care | Payer: Medicare HMO | Source: Ambulatory Visit | Attending: Student in an Organized Health Care Education/Training Program | Admitting: Student in an Organized Health Care Education/Training Program

## 2021-06-12 ENCOUNTER — Other Ambulatory Visit: Payer: Self-pay

## 2021-06-12 ENCOUNTER — Ambulatory Visit (HOSPITAL_BASED_OUTPATIENT_CLINIC_OR_DEPARTMENT_OTHER): Payer: Medicare HMO | Admitting: Student in an Organized Health Care Education/Training Program

## 2021-06-12 DIAGNOSIS — M533 Sacrococcygeal disorders, not elsewhere classified: Secondary | ICD-10-CM | POA: Insufficient documentation

## 2021-06-12 DIAGNOSIS — G894 Chronic pain syndrome: Secondary | ICD-10-CM

## 2021-06-12 DIAGNOSIS — Z79899 Other long term (current) drug therapy: Secondary | ICD-10-CM | POA: Diagnosis not present

## 2021-06-12 DIAGNOSIS — Z7982 Long term (current) use of aspirin: Secondary | ICD-10-CM | POA: Diagnosis not present

## 2021-06-12 DIAGNOSIS — M25552 Pain in left hip: Secondary | ICD-10-CM | POA: Diagnosis not present

## 2021-06-12 DIAGNOSIS — M25551 Pain in right hip: Secondary | ICD-10-CM | POA: Insufficient documentation

## 2021-06-12 DIAGNOSIS — G8929 Other chronic pain: Secondary | ICD-10-CM

## 2021-06-12 MED ORDER — IOHEXOL 180 MG/ML  SOLN
10.0000 mL | Freq: Once | INTRAMUSCULAR | Status: AC
  Administered 2021-06-12: 10 mL via INTRA_ARTICULAR
  Filled 2021-06-12: qty 20

## 2021-06-12 MED ORDER — DEXAMETHASONE SODIUM PHOSPHATE 10 MG/ML IJ SOLN
10.0000 mg | Freq: Once | INTRAMUSCULAR | Status: AC
  Administered 2021-06-12: 10 mg

## 2021-06-12 MED ORDER — DEXAMETHASONE SODIUM PHOSPHATE 10 MG/ML IJ SOLN
INTRAMUSCULAR | Status: AC
  Filled 2021-06-12: qty 2

## 2021-06-12 MED ORDER — ROPIVACAINE HCL 2 MG/ML IJ SOLN
INTRAMUSCULAR | Status: AC
  Filled 2021-06-12: qty 20

## 2021-06-12 MED ORDER — LIDOCAINE HCL 2 % IJ SOLN
20.0000 mL | Freq: Once | INTRAMUSCULAR | Status: AC
  Administered 2021-06-12: 400 mg

## 2021-06-12 MED ORDER — ROPIVACAINE HCL 2 MG/ML IJ SOLN
10.0000 mL/h | Freq: Once | INTRAMUSCULAR | Status: AC
  Administered 2021-06-12: 10 mL/h via INTRA_ARTICULAR

## 2021-06-12 NOTE — Progress Notes (Signed)
Safety precautions to be maintained throughout the outpatient stay will include: orient to surroundings, keep bed in low position, maintain call bell within reach at all times, provide assistance with transfer out of bed and ambulation.  

## 2021-06-12 NOTE — Progress Notes (Signed)
Patient's Name: Tamara Finley  MRN: [id number]  Referring Provider: Gillis Santa, MD  DOB: 1941-02-03  PCP: Adin Hector, MD  DOS: 06/12/2021  Note by: Gillis Santa, MD  Service setting: Ambulatory outpatient  Specialty: Interventional Pain Management  Patient type: Established  Location: [facility] ([facility]  Visit type: Interventional Procedure   Primary Reason for Visit: Interventional Pain Management Treatment. CC: Hip Pain (bilateral)  Procedure:          Anesthesia, Analgesia, Anxiolysis:  Type: Diagnostic Sacroiliac Joint Steroid Injection #2  Region: Inferior Lumbosacral Region Level: PIIS (Posterior Inferior Iliac Spine) Laterality: Bilateral  Type: Local Anesthesia  Local Anesthetic: Lidocaine 1-2%  Position: Prone           Indications: 1. Chronic SI joint pain   2. Chronic pain syndrome    Pain Score: Pre-procedure: 9 /10 Post-procedure: 2 /10  Pre-op Assessment:  Tamara Finley is a 80 y.o. (year old), female patient, seen today for interventional treatment. She  has a past surgical history that includes Cardiac surgery; Abdominal aortic aneurysm repair; Cardiac catheterization (N/A, 12/05/2015); Cardiac catheterization (Left, 12/31/2015); Breast excisional biopsy (Left, 2010); Breast excisional biopsy (Left, 07/01/2006); Breast biopsy (Left, 09/03/2016); Breast biopsy (Left, 09/25/2016); Bronchoscopy; CAROTID PTA/STENT INTERVENTION; Colonoscopy; Eye surgery; Laminectomy (07/25/2013); Back surgery; Mediastinoscopy; Mastectomy; Thoracoscopy (12/16/2016); and Colonoscopy with propofol (N/A, 06/15/2017). Tamara Finley has a current medication list which includes the following prescription(s): acetaminophen, albuterol, anastrozole, aspirin, atorvastatin, azelastine, calcium 600+d plus minerals, carvedilol, fluticasone, furosemide, gabapentin, hydralazine, hydrocodone-acetaminophen, [START ON 06/30/2021] hydrocodone-acetaminophen, levothyroxine, mirtazapine,  montelukast, pantoprazole, potassium chloride sa, and vitamin b-12. Her primarily concern today is the Hip Pain (bilateral)  Initial Vital Signs:  Pulse/HCG Rate: 72ECG Heart Rate: 73 Temp: (!) 96.9 F (36.1 C) Resp: 16 BP: 129/64 SpO2: 98 %  BMI: Estimated body mass index is 31.58 kg/m as calculated from the following:   Height as of this encounter: 5\' 4"  (1.626 m).   Weight as of this encounter: 184 lb (83.5 kg).  Risk Assessment: Allergies: Reviewed. She is allergic to benadryl [diphenhydramine hcl (sleep)] and alprazolam.  Allergy Precautions: None required Coagulopathies: Reviewed. None identified.  Blood-thinner therapy: None at this time Active Infection(s): Reviewed. None identified. [name] is afebrile  Site Confirmation: Tamara Finley was asked to confirm the procedure and laterality before marking the site Procedure checklist: Completed Consent: Before the procedure and under the influence of no sedative(s), amnesic(s), or anxiolytics, the patient was informed of the treatment options, risks and possible complications. To fulfill our ethical and legal obligations, as recommended by the American Medical Association's Code of Ethics, I have informed the patient of my clinical impression; the nature and purpose of the treatment or procedure; the risks, benefits, and possible complications of the intervention; the alternatives, including doing nothing; the risk(s) and benefit(s) of the alternative treatment(s) or procedure(s); and the risk(s) and benefit(s) of doing nothing. The patient was provided information about the general risks and possible complications associated with the procedure. These may include, but are not limited to: failure to achieve desired goals, infection, bleeding, organ or nerve damage, allergic reactions, paralysis, and death. In addition, the patient was informed of those risks and complications associated to the procedure, such as failure to decrease pain;  infection; bleeding; organ or nerve damage with subsequent damage to sensory, motor, and/or autonomic systems, resulting in permanent pain, numbness, and/or weakness of one or several areas of the body; allergic reactions; (i.e.: anaphylactic reaction); and/or death. Furthermore, the  patient was informed of those risks and complications associated with the medications. These include, but are not limited to: allergic reactions (i.e.: anaphylactic or anaphylactoid reaction(s)); adrenal axis suppression; blood sugar elevation that in diabetics may result in ketoacidosis or comma; water retention that in patients with history of congestive heart failure may result in shortness of breath, pulmonary edema, and decompensation with resultant heart failure; weight gain; swelling or edema; medication-induced neural toxicity; particulate matter embolism and blood vessel occlusion with resultant organ, and/or nervous system infarction; and/or aseptic necrosis of one or more joints. Finally, the patient was informed that Medicine is not an exact science; therefore, there is also the possibility of unforeseen or unpredictable risks and/or possible complications that may result in a catastrophic outcome. The patient indicated having understood very clearly. We have given the patient no guarantees and we have made no promises. Enough time was given to the patient to ask questions, all of which were answered to the patient's satisfaction. Tamara Finley has indicated that she wanted to continue with the procedure. Attestation: I, the ordering provider, attest that I have discussed with the patient the benefits, risks, side-effects, alternatives, likelihood of achieving goals, and potential problems during recovery for the procedure that I have provided informed consent. Date  Time: 06/12/2021 10:42 AM  Pre-Procedure Preparation:  Monitoring: As per clinic protocol. Respiration, ETCO2, SpO2, BP, heart rate and rhythm monitor  placed and checked for adequate function Safety Precautions: Patient was assessed for positional comfort and pressure points before starting the procedure. Time-out: I initiated and conducted the "Time-out" before starting the procedure, as per protocol. The patient was asked to participate by confirming the accuracy of the "Time Out" information. Verification of the correct person, site, and procedure were performed and confirmed by me, the nursing staff, and the patient. "Time-out" conducted as per Joint Commission's Universal Protocol (UP.01.01.01). Time: 1134  Description of Procedure:          Target Area: Inferior, posterior, aspect of the sacroiliac fissure Approach: Posterior, paraspinal, ipsilateral approach. Area Prepped: Entire Lower Lumbosacral Region Prepping solution: ChloraPrep (2% chlorhexidine gluconate and 70% isopropyl alcohol) Safety Precautions: Aspiration looking for blood return was conducted prior to all injections. At no point did we inject any substances, as a needle was being advanced. No attempts were made at seeking any paresthesias. Safe injection practices and needle disposal techniques used. Medications properly checked for expiration dates. SDV (single dose vial) medications used. Description of the Procedure: Protocol guidelines were followed. The patient was placed in position over the procedure table. The target area was identified and the area prepped in the usual manner. Skin & deeper tissues infiltrated with local anesthetic. Appropriate amount of time allowed to pass for local anesthetics to take effect. The procedure needle was advanced under fluoroscopic guidance into the sacroiliac joint until a firm endpoint was obtained. Proper needle placement secured. Negative aspiration confirmed. Solution injected in intermittent fashion, asking for systemic symptoms every 0.5cc of injectate. The needles were then removed and the area cleansed, making sure to leave some of  the prepping solution back to take advantage of its long term bactericidal properties. Vitals:   06/12/21 1050 06/12/21 1135 06/12/21 1140  BP: 129/64 (!) 143/79 (!) 148/83  Pulse: 72    Resp: 16 13 16   Temp: (!) 96.9 F (36.1 C)    TempSrc: Temporal    SpO2: 98% 98% 100%  Weight: 184 lb (83.5 kg)    Height: 5\' 4"  (1.626 m)  Start Time: 1134 hrs. End Time: 1139 hrs. Materials:  Needle(s) Type: Spinal Needle Gauge: 25G Length: 3.5-in Medication(s): Please see orders for medications and dosing details. 5 cc solution made of 4 cc of 0.2% ropivacaine, 1 cc of Decadron 10 mg/cc.  2.5 cc injected intra-articular, 2.5 cc injected periarticular for the right side 5 cc solution made of 4 cc of 0.2% ropivacaine, 1 cc of Decadron 10 mg/cc.  2.5 cc injected intra-articular, 2.5 cc injected periarticular for the left side Imaging Guidance (Non-Spinal):          Type of Imaging Technique: Fluoroscopy Guidance (Non-Spinal) Indication(s): Assistance in needle guidance and placement for procedures requiring needle placement in or near specific anatomical locations not easily accessible without such assistance. Exposure Time: Please see nurses notes. Contrast: Before injecting any contrast, we confirmed that the patient did not have an allergy to iodine, shellfish, or radiological contrast. Once satisfactory needle placement was completed at the desired level, radiological contrast was injected. Contrast injected under live fluoroscopy. No contrast complications. See chart for type and volume of contrast used. Fluoroscopic Guidance: I was personally present during the use of fluoroscopy. "Tunnel Vision Technique" used to obtain the best possible view of the target area. Parallax error corrected before commencing the procedure. "Direction-depth-direction" technique used to introduce the needle under continuous pulsed fluoroscopy. Once target was reached, antero-posterior, oblique, and lateral  fluoroscopic projection used confirm needle placement in all planes. Images permanently stored in EMR. Interpretation: I personally interpreted the imaging intraoperatively. Adequate needle placement confirmed in multiple planes. Appropriate spread of contrast into desired area was observed. No evidence of afferent or efferent intravascular uptake. Permanent images saved into the patient's record.   Post-operative Assessment:  Post-procedure Vital Signs:  Pulse/HCG Rate: 7276 Temp: (!) 96.9 F (36.1 C) Resp: 16 BP: (!) 148/83 SpO2: 100 %  EBL: None  Complications: No immediate post-treatment complications observed by team, or reported by patient.  Note: The patient tolerated the entire procedure well. A repeat set of vitals were taken after the procedure and the patient was kept under observation following institutional policy, for this type of procedure. Post-procedural neurological assessment was performed, showing return to baseline, prior to discharge. The patient was provided with post-procedure discharge instructions, including a section on how to identify potential problems. Should any problems arise concerning this procedure, the patient was given instructions to immediately contact us, at any time, without hesitation. In any case, we plan to contact the patient by telephone for a follow-up status report regarding this interventional procedure.  Comments:  No additional relevant information.  Plan of Care   Imaging Orders         DG PAIN CLINIC C-ARM 1-60 MIN NO REPORT     Procedure Orders    No procedure(s) ordered today    Medications ordered for procedure: Meds ordered this encounter  Medications   iohexol (OMNIPAQUE) 180 MG/ML injection 10 mL    Must be Myelogram-compatible. If not available, you may substitute with a water-soluble, non-ionic, hypoallergenic, myelogram-compatible radiological contrast medium.   lidocaine (XYLOCAINE) 2 % (with pres) injection 400 mg    dexamethasone (DECADRON) injection 10 mg   dexamethasone (DECADRON) injection 10 mg   ropivacaine (PF) 2 mg/mL (0.2%) (NAROPIN) injection    Medications administered: We administered iohexol, lidocaine, dexamethasone, dexamethasone, and ropivacaine (PF) 2 mg/mL (0.2%).  See the medical record for exact dosing, route, and time of administration.  Disposition: Discharge home  Discharge Date & Time: 06/12/2021; 1150 hrs.  Physician-requested Follow-up: Return for Keep sch. appt.  Future Appointments  Date Time Provider [place]  07/02/2021  7:30 AM [facility] VASC 2 [facility] None  07/02/2021  8:30 AM [facility] V[facility] 2 [facility] None  07/02/2021  9:00 AM Dew, Erskine Squibb, MD [facility] None  07/30/2021  1:40 PM Gillis Santa, MD [facility] None   Primary Care Physician: Adin Hector, MD Location: [facility] Note by: Gillis Santa, MD Date: 06/12/2021; Time: 11:54 AM  Disclaimer:  Medicine is not an exact science. The only guarantee in medicine is that nothing is guaranteed. It is important to note that the decision to proceed with this intervention was based on the information collected from the patient. The Data and conclusions were drawn from the patient's questionnaire, the interview, and the physical examination. Because the information was provided in large part by the patient, it cannot be guaranteed that it has not been purposely or unconsciously manipulated. Every effort has been made to obtain as much relevant data as possible for this evaluation. It is important to note that the conclusions that lead to this procedure are derived in large part from the available data. Always take into account that the treatment will also be dependent on availability of resources and existing treatment guidelines, considered by other Pain Management Practitioners as being common knowledge and practice, at the time of the intervention. For Medico-Legal purposes, it is also  important to point out that variation in procedural techniques and pharmacological choices are the acceptable norm. The indications, contraindications, technique, and results of the above procedure should only be interpreted and judged by a Board-Certified Interventional Pain Specialist with extensive familiarity and expertise in the same exact procedure and technique.

## 2021-06-12 NOTE — Patient Instructions (Signed)

## 2021-06-13 ENCOUNTER — Telehealth: Payer: Self-pay | Admitting: *Deleted

## 2021-06-13 NOTE — Telephone Encounter (Signed)
Post procedure call,  no voicemail capability.

## 2021-07-01 ENCOUNTER — Other Ambulatory Visit (INDEPENDENT_AMBULATORY_CARE_PROVIDER_SITE_OTHER): Payer: Self-pay | Admitting: Vascular Surgery

## 2021-07-01 DIAGNOSIS — I6522 Occlusion and stenosis of left carotid artery: Secondary | ICD-10-CM

## 2021-07-02 ENCOUNTER — Encounter (INDEPENDENT_AMBULATORY_CARE_PROVIDER_SITE_OTHER): Payer: Self-pay | Admitting: Vascular Surgery

## 2021-07-02 ENCOUNTER — Ambulatory Visit (INDEPENDENT_AMBULATORY_CARE_PROVIDER_SITE_OTHER): Payer: Medicare HMO

## 2021-07-02 ENCOUNTER — Other Ambulatory Visit: Payer: Self-pay

## 2021-07-02 ENCOUNTER — Ambulatory Visit (INDEPENDENT_AMBULATORY_CARE_PROVIDER_SITE_OTHER): Payer: Medicare HMO | Admitting: Vascular Surgery

## 2021-07-02 VITALS — BP 169/81 | HR 75 | Ht 64.0 in | Wt 167.0 lb

## 2021-07-02 DIAGNOSIS — I701 Atherosclerosis of renal artery: Secondary | ICD-10-CM

## 2021-07-02 DIAGNOSIS — Z9889 Other specified postprocedural states: Secondary | ICD-10-CM | POA: Diagnosis not present

## 2021-07-02 DIAGNOSIS — I6523 Occlusion and stenosis of bilateral carotid arteries: Secondary | ICD-10-CM

## 2021-07-02 DIAGNOSIS — I10 Essential (primary) hypertension: Secondary | ICD-10-CM

## 2021-07-02 DIAGNOSIS — E1159 Type 2 diabetes mellitus with other circulatory complications: Secondary | ICD-10-CM | POA: Diagnosis not present

## 2021-07-02 DIAGNOSIS — M7989 Other specified soft tissue disorders: Secondary | ICD-10-CM | POA: Diagnosis not present

## 2021-07-02 NOTE — Assessment & Plan Note (Signed)
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  No focal neurologic symptoms.  Continue current medical regimen.  Recheck in 6 months.

## 2021-07-02 NOTE — Assessment & Plan Note (Signed)
blood pressure control important in reducing the progression of atherosclerotic disease. On appropriate oral medications.  

## 2021-07-02 NOTE — Progress Notes (Signed)
MRN : [id number]  Tamara Finley is a 80 y.o. (1940/09/07) female who presents with chief complaint of  Chief Complaint  Patient presents with   Follow-up    U/S FU  .  History of Present Illness: patient returns in follow up of multiple vascular issues. She is doing well today.  Her biggest complaint is of back pain. 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 Stable left kidney size and no hemodynamically significant stenosis in the left renal artery. BP has been stable and her function is stable as far as she knows.  She is also followed for carotid disease.  No focal neurologic symptoms. Specifically, the patient denies amaurosis fugax, speech or swallowing difficulties, or arm or leg weakness or numbness.  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  Current Outpatient Medications  Medication Sig Dispense Refill   acetaminophen (TYLENOL) 500 MG tablet Take 1,000 mg by mouth every 8 (eight) hours as needed for moderate pain.      albuterol (PROVENTIL HFA;VENTOLIN HFA) 108 (90 BASE) MCG/ACT inhaler Inhale 2 puffs into the lungs every 6 (six) hours as needed for wheezing or shortness of breath. Reported on 12/31/2015     anastrozole (ARIMIDEX) 1 MG tablet Take 1 mg by mouth daily.      aspirin 81 MG chewable tablet Chew 81 mg by mouth daily.     atorvastatin (LIPITOR) 40 MG tablet Take 40 mg by mouth daily at 6 PM.      azelastine (ASTELIN) 0.1 % nasal spray Place into both nostrils 2 (two) times daily. Use in each nostril as directed     Calcium Carbonate-Vit D-Min (CALCIUM 600+D PLUS MINERALS) 600-400 MG-UNIT TABS Take by mouth.     carvedilol (COREG) 6.25 MG tablet Take 6.25 mg by mouth 2 (two) times daily with a meal.     Docusate Sodium (DSS) 100 MG CAPS Take by mouth.     fluticasone (FLONASE) 50 MCG/ACT nasal spray Place 1-2 sprays into both nostrils  daily as needed for rhinitis.     furosemide (LASIX) 40 MG tablet Take 1 tablet by mouth 1 day or 1 dose.     gabapentin (NEURONTIN) 300 MG capsule Take 2 capsules (600 mg total) by mouth at bedtime. 90 capsule 0   gabapentin (NEURONTIN) 300 MG capsule Take 2 capsules by mouth at bedtime.     hydrALAZINE (APRESOLINE) 50 MG tablet Take 50 mg by mouth 3 (three) times daily.     HYDROcodone-acetaminophen (NORCO/VICODIN) 5-325 MG tablet Take 1 tablet by mouth 2 (two) times daily as needed for moderate pain. 60 tablet 0   levothyroxine (SYNTHROID) 88 MCG tablet Take by mouth.     levothyroxine (SYNTHROID, LEVOTHROID) 88 MCG tablet Take 88 mcg by mouth daily before breakfast.     mirtazapine (REMERON) 15 MG tablet TAKE 1 TABLET BY MOUTH EVERY DAY AT NIGHT  3   montelukast (SINGULAIR) 10 MG tablet Take 10 mg by mouth at bedtime.     pantoprazole (PROTONIX) 40 MG tablet Take 40 mg by mouth daily.     potassium chloride SA (K-DUR,KLOR-CON) 20 MEQ tablet Take 20 mEq by mouth as directed.     vitamin B-12 (CYANOCOBALAMIN) 1000 MCG tablet Take 1 tablet by mouth 1 day or 1 dose.     No current facility-administered medications  for this visit.    Past Medical History:  Diagnosis Date   Abdominal aortic aneurysm (AAA) 2000's   Age related entropion, unspecified laterality    Anxiety    Anxiety and depression    Bilateral renal artery stenosis (HCC)    Breast calcification, left    Cancer ([place]) 10/20/2016   breast cancer   Carotid artery thrombosis, bilateral    Carotid stenosis    Chronic bronchitis (HCC)    Chronic insomnia    COPD (chronic obstructive pulmonary disease) (HCC)    Coronary artery disease    CRI (chronic renal insufficiency), stage 3 (moderate) (HCC)    Depression    Diabetes mellitus without complication (HCC)    GERD (gastroesophageal reflux disease)    Heart palpitations    History of AAA (abdominal aortic aneurysm) repair    History of colon polyps    History of TIA  (transient ischemic attack)    Hypercholesterolemia    Hypertension    Hypothyroidism    Migraine headache    Osteoarthritis    Osteoporosis    Peripheral arterial disease (HCC)    Pulmonary scarring    Renal disorder    SOB (shortness of breath)    Stenosis of left subclavian artery (HCC)    Stroke (HCC)    Thyroid disease    TIA (transient ischemic attack)    Type 2 diabetes mellitus with vascular disease ([place])     Past Surgical History:  Procedure Laterality Date   ABDOMINAL AORTIC ANEURYSM REPAIR     BACK SURGERY     BREAST BIOPSY Left 09/03/2016   benign   BREAST BIOPSY Left 09/25/2016   path pending   BREAST EXCISIONAL BIOPSY Left 2010   BREAST EXCISIONAL BIOPSY Left 07/01/2006   BRONCHOSCOPY     CARDIAC SURGERY     CAROTID PTA/STENT INTERVENTION     COLONOSCOPY     COLONOSCOPY WITH PROPOFOL N/A 06/15/2017   Procedure: COLONOSCOPY WITH PROPOFOL;  Surgeon: Lollie Sails, MD;  Location: [facility] ENDOSCOPY;  Service: Endoscopy;  Laterality: N/A;   EYE SURGERY     LAMINECTOMY  07/25/2013   MASTECTOMY     MEDIASTINOSCOPY     PERIPHERAL VASCULAR CATHETERIZATION N/A 12/05/2015   Procedure: Renal Angiography;  Surgeon: Katha Cabal, MD;  Location: [facility];  Service: Cardiovascular;  Laterality: N/A;   PERIPHERAL VASCULAR CATHETERIZATION Left 12/31/2015   Procedure: Carotid PTA/Stent Intervention;  Surgeon: Algernon Huxley, MD;  Location: [facility];  Service: Cardiovascular;  Laterality: Left;   THORACOSCOPY  12/16/2016     Social History   Tobacco Use   Smoking status: Former    Packs/day: 2.00    Years: 50.00    Pack years: 100.00    Types: Cigarettes    Quit date: 06/01/2013    Years since quitting: 8.0   Smokeless tobacco: Never  Vaping Use   Vaping Use: Never used  Substance Use Topics   Alcohol use: No    Alcohol/week: 0.0 standard drinks   Drug use: No      Family History  Problem Relation Age of Onset   CVA Mother     Hypertension Mother    Stroke Mother    Epilepsy Mother    Dementia Father    Stroke Father    Coronary artery disease Father    Hypertension Father    Ovarian cancer Sister    Cancer Brother    Diabetes Brother  Allergies  Allergen Reactions   Benadryl [Diphenhydramine Hcl (Sleep)] Other (See Comments)    BAD DREAMS   Alprazolam Rash    "bad dreams"    REVIEW OF SYSTEMS (Negative unless checked)   Constitutional: [] Weight loss  [] Fever  [] Chills Cardiac: [] Chest pain   [] Chest pressure   [] Palpitations   [] Shortness of breath when laying flat   [] Shortness of breath at rest   [x] Shortness of breath with exertion. Vascular:  [] Pain in legs with walking   [] Pain in legs at rest   [] Pain in legs when laying flat   [] Claudication   [] Pain in feet when walking  [] Pain in feet at rest  [] Pain in feet when laying flat   [] History of DVT   [] Phlebitis   [x] Swelling in legs   [] Varicose veins   [] Non-healing ulcers Pulmonary:   [] Uses home oxygen   [] Productive cough   [] Hemoptysis   [] Wheeze  [x] COPD   [] Asthma Neurologic:  [] Dizziness  [] Blackouts   [] Seizures   [] History of stroke   [x] History of TIA  [] Aphasia   [] Temporary blindness   [] Dysphagia   [] Weakness or numbness in arms   [] Weakness or numbness in legs Musculoskeletal:  [x] Arthritis   [] Joint swelling   [] Joint pain   [] Low back pain Hematologic:  [] Easy bruising  [] Easy bleeding   [] Hypercoagulable state   [] Anemic   Gastrointestinal:  [] Blood in stool   [] Vomiting blood  [] Gastroesophageal reflux/heartburn   [] Abdominal pain Genitourinary:  [x] Chronic kidney disease   [] Difficult urination  [] Frequent urination  [] Burning with urination   [] Hematuria Skin:  [] Rashes   [] Ulcers   [] Wounds Psychological:  [] History of anxiety   []  History of major depression.  Physical Examination  Vitals:   07/02/21 0912  BP: (!) 169/81  Pulse: 75  Weight: 167 lb (75.8 kg)  Height: 5\' 4"  (1.626 m)   Body mass index is 28.67  kg/m. Gen:  WD/WN, NAD Head: [place]/AT, No temporalis wasting. Ear/Nose/Throat: Hearing grossly intact, nares w/o erythema or drainage, trachea midline Eyes: Conjunctiva clear. Sclera non-icteric Neck: Supple.  Soft right carotid bruit  Pulmonary:  Good air movement, equal and clear to auscultation bilaterally.  Cardiac: RRR, No JVD Vascular:  Vessel Right Left  Radial Palpable Palpable                          PT Palpable Palpable  DP Palpable Palpable   Gastrointestinal: soft, non-tender/non-distended. No guarding/reflex.  Musculoskeletal: M/S 5/5 throughout.  No deformity or atrophy. No edema. Neurologic: CN 2-12 intact. Sensation grossly intact in extremities.  Symmetrical.  Speech is fluent. Motor exam as listed above. Psychiatric: Judgment intact, Mood & affect appropriate for pt's clinical situation. Dermatologic: No rashes or ulcers noted.  No cellulitis or open wounds.     CBC Lab Results  Component Value Date   WBC 5.5 01/01/2016   HGB 11.2 (L) 01/01/2016   HCT 33.9 (L) 01/01/2016   MCV 88.0 01/01/2016   PLT 241 01/01/2016    BMET    Component Value Date/Time   NA 139 01/01/2016 0513   K 3.5 01/01/2016 0513   CL 106 01/01/2016 0513   CO2 24 01/01/2016 0513   GLUCOSE 90 01/01/2016 0513   BUN 9 01/01/2016 0513   BUN 23 (H) 10/18/2012 0815   CREATININE 1.03 (H) 01/01/2016 0513   CREATININE 1.15 10/18/2012 0815   CALCIUM 8.2 (L) 01/01/2016 0513   GFRNONAA 53 (L) 01/01/2016 3086  GFRNONAA 48 (L) 10/18/2012 0815   GFRAA >60 01/01/2016 0513   GFRAA 56 (L) 10/18/2012 0815   CrCl cannot be calculated (Patient's most recent lab result is older than the maximum 21 days allowed.).  COAG Lab Results  Component Value Date   INR 1.04 10/17/2015   INR 1.19 10/12/2015    Radiology DG Cervical Spine Complete  Result Date: 06/11/2021 CLINICAL DATA:  Bilateral neck pain for 4 months.  No known injury. EXAM: CERVICAL SPINE - COMPLETE 4+ VIEW COMPARISON:  None.  FINDINGS: Normal alignment of the cervical spine. No vertebral compression deformities. No focal bone lesion or bone destruction. Normal alignment of the posterior elements. No significant bone encroachment upon the neural foramina. Degenerative changes with narrowed interspaces and endplate osteophyte formation at C4-5 and C5-6 levels. Vascular calcifications. Stent in the left carotid region. IMPRESSION: Normal alignment. No acute displaced fractures. Mild degenerative changes. Electronically Signed   By: Lucienne Capers M.D.   On: 06/11/2021 21:13   DG PAIN CLINIC C-ARM 1-60 MIN NO REPORT  Result Date: 06/12/2021 Fluoro was used, but no Radiologist interpretation will be provided. Please refer to "NOTES" tab for provider progress note.    Assessment/Plan Type 2 diabetes mellitus with vascular disease (HCC) blood glucose control important in reducing the progression of atherosclerotic disease. Also, involved in wound healing. On appropriate medications.     Hyperlipidemia, unspecified lipid control important in reducing the progression of atherosclerotic disease. Continue statin therapy   History of AAA (abdominal aortic aneurysm) repair Aortobiiliac graft appears patent by duplex at last check  Benign essential hypertension blood pressure control important in reducing the progression of atherosclerotic disease. On appropriate oral medications.   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 Stable left kidney size and no hemodynamically significant stenosis in the left renal artery. Overall doing well. Continue to follow on six month intervals since it is a solitary kidney.  Carotid stenosis Carotid duplex today reveals stable stenosis in the upper end of the 40 to 59% range on the right and stable stenosis in the 1 to 39% range on the left.  No focal neurologic symptoms.  Continue current medical regimen.  Recheck in 6  months.    Leotis Pain, MD  07/02/2021 10:03 AM    This note was created with Dragon medical transcription system.  Any errors from dictation are purely unintentional

## 2021-07-02 NOTE — Assessment & Plan Note (Signed)
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 Stable left kidney size and no hemodynamically significant stenosis in the left renal artery. Overall doing well. Continue to follow on six month intervals since it is a solitary kidney.

## 2021-07-30 ENCOUNTER — Encounter: Payer: Medicare HMO | Admitting: Student in an Organized Health Care Education/Training Program

## 2021-08-01 ENCOUNTER — Ambulatory Visit
Payer: Medicare HMO | Attending: Student in an Organized Health Care Education/Training Program | Admitting: Student in an Organized Health Care Education/Training Program

## 2021-08-01 ENCOUNTER — Encounter: Payer: Self-pay | Admitting: Student in an Organized Health Care Education/Training Program

## 2021-08-01 ENCOUNTER — Other Ambulatory Visit: Payer: Self-pay

## 2021-08-01 VITALS — BP 192/75 | HR 67 | Temp 96.9°F | Resp 15 | Ht 64.0 in | Wt 180.0 lb

## 2021-08-01 DIAGNOSIS — M542 Cervicalgia: Secondary | ICD-10-CM | POA: Diagnosis not present

## 2021-08-01 DIAGNOSIS — M533 Sacrococcygeal disorders, not elsewhere classified: Secondary | ICD-10-CM | POA: Insufficient documentation

## 2021-08-01 DIAGNOSIS — M47816 Spondylosis without myelopathy or radiculopathy, lumbar region: Secondary | ICD-10-CM | POA: Diagnosis not present

## 2021-08-01 DIAGNOSIS — G5703 Lesion of sciatic nerve, bilateral lower limbs: Secondary | ICD-10-CM | POA: Insufficient documentation

## 2021-08-01 DIAGNOSIS — M5030 Other cervical disc degeneration, unspecified cervical region: Secondary | ICD-10-CM | POA: Diagnosis not present

## 2021-08-01 DIAGNOSIS — G894 Chronic pain syndrome: Secondary | ICD-10-CM | POA: Insufficient documentation

## 2021-08-01 DIAGNOSIS — G8929 Other chronic pain: Secondary | ICD-10-CM | POA: Diagnosis not present

## 2021-08-01 MED ORDER — HYDROCODONE-ACETAMINOPHEN 5-325 MG PO TABS: 1.0000 | ORAL_TABLET | Freq: Two times a day (BID) | ORAL | 0 refills | Status: DC | PRN

## 2021-08-01 MED ORDER — HYDROCODONE-ACETAMINOPHEN 5-325 MG PO TABS: 1.0000 | ORAL_TABLET | Freq: Two times a day (BID) | ORAL | 0 refills | Status: AC | PRN

## 2021-08-01 NOTE — Progress Notes (Signed)
PROVIDER NOTE: Information contained herein reflects review and annotations entered in association with encounter. Interpretation of such information and data should be left to medically-trained personnel. Information provided to patient can be located elsewhere in the medical record under "Patient Instructions". Document created using STT-dictation technology, any transcriptional errors that may result from process are unintentional.    Patient: Tamara Finley  Service Category: E/M  Provider: Gillis Santa, MD  DOB: 08/29/1941  DOS: 08/01/2021  Specialty: Interventional Pain Management  MRN: [id number]  Setting: Ambulatory outpatient  PCP: Adin Hector, MD  Type: Established Patient    Referring Provider: Adin Hector, MD  Location: Office  Delivery: Face-to-face     HPI  [name], a 80 y.o. year old female, is here today because of her Chronic SI joint pain [M53.3, G89.29]. Ms. Timothy primary complain today is Back Pain (lower) Last encounter: My last encounter with her was on 06/12/2021. Pertinent problems: Ms. Helmuth has Low back pain radiating to both legs; Peripheral arterial disease ([place]); Right hip pain; Chronic SI joint pain; Lumbar facet arthropathy (L4/L5 and L5/S1); Controlled substance agreement signed; Lumbar spondylosis; and Lumbar radiculopathy on their pertinent problem list. Pain Assessment: Severity of Chronic pain is reported as a 8 /10. Location: Back Lower/to knees bilat. Onset: More than a month ago. Quality: Aching, Dull, Discomfort. Timing: Constant. Modifying factor(s): meds, sitting. Vitals:  height is 5' 4"  (1.626 m) and weight is 180 lb (81.6 kg). Her temporal temperature is 96.9 F (36.1 C) (abnormal). Her blood pressure is 192/75 (abnormal) and her pulse is 67. Her respiration is 15 and oxygen saturation is 97%.   Reason for encounter: medication management.   No change in medical history since last visit.  Patient's pain is at baseline.  Patient  continues multimodal pain regimen as prescribed.  States that it provides pain relief and improvement in functional status. Unfortunately, the patient has been under greater stress as her husband's lymphoma has recurred.  He is currently being managed at [facility]. Patient is utilizing Dulcolax & magnesium for constipation  Pharmacotherapy Assessment  Analgesic: Hydrocodone 5 mg 3 times daily prn, quantity 75/month    Monitoring: [place] PMP: PDMP reviewed during this encounter.       Pharmacotherapy: No side-effects or adverse reactions reported. Compliance: No problems identified. Effectiveness: Clinically acceptable.  Rise Patience, RN  08/01/2021  1:16 PM  Sign when Signing Visit Nursing Pain Medication Assessment:  Safety precautions to be maintained throughout the outpatient stay will include: orient to surroundings, keep bed in low position, maintain call bell within reach at all times, provide assistance with transfer out of bed and ambulation.  Medication Inspection Compliance: Pill count conducted under aseptic conditions, in front of the patient. Neither the pills nor the bottle was removed from the patient's sight at any time. Once count was completed pills were immediately returned to the patient in their original bottle.  Medication: Hydrocodone/APAP Pill/Patch Count:  24 of 60 pills remain Pill/Patch Appearance: Markings consistent with prescribed medication Bottle Appearance: Standard pharmacy container. Clearly labeled. Filled Date: 59 / 09 / 2022 Last Medication intake:  Yesterday      UDS:  Summary  Date Value Ref Range Status  04/30/2021 Note  Final    Comment:    ==================================================================== [employer] Select 13 (MW) ==================================================================== Test  Result       Flag       Units  Drug Present and Declared for Prescription Verification   Norhydrocodone                  223          EXPECTED   ng/mg creat    Norhydrocodone is an expected metabolite of hydrocodone.  Drug Absent but Declared for Prescription Verification   Hydrocodone                    Not Detected UNEXPECTED ng/mg creat    Hydrocodone is almost always present in patients taking this drug    consistently. Absence of hydrocodone could be due to lapse of time    since the last dose or unusual pharmacokinetics (rapid metabolism).  ==================================================================== Test                      Result    Flag   Units      Ref Range   Creatinine              26               mg/dL      >=20 ==================================================================== Declared Medications:  The flagging and interpretation on this report are based on the  following declared medications.  Unexpected results may arise from  inaccuracies in the declared medications.   **Note: The testing scope of this panel includes these medications:   Hydrocodone (Norco)   **Note: The testing scope of this panel does not include the  following reported medications:   Acetaminophen (Tylenol)  Acetaminophen (Norco)  Albuterol (Ventolin HFA)  Anastrozole (Arimidex)  Aspirin  Atorvastatin (Lipitor)  Azelastine (Astelin)  Calcium  Carvedilol (Coreg)  Cyanocobalamin  Fluticasone (Flonase)  Furosemide (Lasix)  Gabapentin (Neurontin)  Hydralazine (Apresoline)  Levothyroxine (Synthroid)  Mirtazapine (Remeron)  Montelukast (Singulair)  Pantoprazole (Protonix)  Potassium (Klor-Con)  Vitamin D ==================================================================== For clinical consultation, please call [phone]. ====================================================================      ROS  Constitutional: Denies any fever or chills Gastrointestinal: No reported hemesis, hematochezia, vomiting, or acute GI distress Musculoskeletal: Denies any acute onset joint swelling,  redness, loss of ROM, or weakness Neurological: No reported episodes of acute onset apraxia, aphasia, dysarthria, agnosia, amnesia, paralysis, loss of coordination, or loss of consciousness  Medication Review  Calcium 600+D Plus Minerals, DSS, HYDROcodone-acetaminophen, acetaminophen, albuterol, anastrozole, aspirin, atorvastatin, azelastine, carvedilol, fluticasone, furosemide, gabapentin, hydrALAZINE, levothyroxine, mirtazapine, montelukast, pantoprazole, potassium chloride SA, and vitamin B-12  History Review  Allergy: Ms. Mckinnie is allergic to benadryl [diphenhydramine hcl (sleep)] and alprazolam. Drug: Ms. Vogl  reports no history of drug use. Alcohol:  reports no history of alcohol use. Tobacco:  reports that she quit smoking about 8 years ago. Her smoking use included cigarettes. She has a 100.00 pack-year smoking history. She has never used smokeless tobacco. Social: Ms. Hickling  reports that she quit smoking about 8 years ago. Her smoking use included cigarettes. She has a 100.00 pack-year smoking history. She has never used smokeless tobacco. She reports that she does not drink alcohol and does not use drugs. Medical:  has a past medical history of Abdominal aortic aneurysm (AAA) (2000's), Age related entropion, unspecified laterality, Anxiety, Anxiety and depression, Bilateral renal artery stenosis ([place]), Breast calcification, left, Cancer ([place]) (10/20/2016), Carotid artery thrombosis, bilateral, Carotid stenosis, Chronic bronchitis ([place]), Chronic insomnia, COPD (chronic obstructive pulmonary disease) ([place]), Coronary artery disease, CRI (  chronic renal insufficiency), stage 3 (moderate) (HCC), Depression, Diabetes mellitus without complication ([place]), GERD (gastroesophageal reflux disease), Heart palpitations, History of AAA (abdominal aortic aneurysm) repair, History of colon polyps, History of TIA (transient ischemic attack), Hypercholesterolemia, Hypertension, Hypothyroidism, Migraine  headache, Osteoarthritis, Osteoporosis, Peripheral arterial disease ([place]), Pulmonary scarring, Renal disorder, SOB (shortness of breath), 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 Surgical: Ms. Lemaire  has a past surgical history that includes Cardiac surgery; Abdominal aortic aneurysm repair; Cardiac catheterization (N/A, 12/05/2015); Cardiac catheterization (Left, 12/31/2015); Breast excisional biopsy (Left, 2010); Breast excisional biopsy (Left, 07/01/2006); Breast biopsy (Left, 09/03/2016); Breast biopsy (Left, 09/25/2016); Bronchoscopy; CAROTID PTA/STENT INTERVENTION; Colonoscopy; Eye surgery; Laminectomy (07/25/2013); Back surgery; Mediastinoscopy; Mastectomy; Thoracoscopy (12/16/2016); and Colonoscopy with propofol (N/A, 06/15/2017). Family: family history includes CVA in her mother; Cancer in her brother; Coronary artery disease in her father; Dementia in her father; Diabetes in her brother; Epilepsy in her mother; Hypertension in her father and mother; Ovarian cancer in her sister; Stroke in her father and mother.  Laboratory Chemistry Profile   Renal Lab Results  Component Value Date   BUN 9 01/01/2016   CREATININE 1.03 (H) 01/01/2016   GFRAA >60 01/01/2016   GFRNONAA 53 (L) 01/01/2016    Hepatic Lab Results  Component Value Date   AST 28 10/17/2015   ALT 17 10/17/2015   ALBUMIN 3.7 10/17/2015   ALKPHOS 64 10/17/2015    Electrolytes Lab Results  Component Value Date   NA 139 01/01/2016   K 3.5 01/01/2016   CL 106 01/01/2016   CALCIUM 8.2 (L) 01/01/2016    Bone No results found for: VD25OH, TD176HY0VPX, TG6269SW5, IO2703JK0, 25OHVITD1, 25OHVITD2, 25OHVITD3, TESTOFREE, TESTOSTERONE  Inflammation (CRP: Acute Phase) (ESR: Chronic Phase) Lab Results  Component Value Date   ESRSEDRATE 23 10/12/2015         Note: Above Lab results reviewed.  Recent Imaging  Review  VAS US RENAL ARTERY DUPLEX ABDOMINAL VISCERAL  Patient Name:  SKYANN GANIM  Date of Exam:   07/02/2021 Medical Rec #: [id number]       Accession #:    [id number] Date of Birth: 1941-01-13      Patient Gender: F Patient Age:   49 years Exam Location:  [facility] Procedure:      VAS US RENAL ARTERY DUPLEX Referring Phys: [id number] JASON S DEW  --------------------------------------------------------------------------------  Limitations: Air/bowel gas and obesity. Comparison Study: 12/28/2020  Performing Technologist: Charlane Ferretti RT (R)(VS)    Examination Guidelines: A complete evaluation includes B-mode imaging, spectral Doppler, color Doppler, and power Doppler as needed of all accessible portions of each vessel. Bilateral testing is considered an integral part of a complete examination. Limited examinations for reoccurring indications may be performed as noted.    Duplex Findings: +----------+--------+--------+------+--------+ MesentericPSV cm/sEDV cm/sPlaqueComments +----------+--------+--------+------+--------+ Aorta Mid    65                          +----------+--------+--------+------+--------+    +------------------+--------+--------+-------+ Right Renal ArteryPSV cm/sEDV cm/sComment +------------------+--------+--------+-------+ Proximal             37                   +------------------+--------+--------+-------+ Mid                  36                   +------------------+--------+--------+-------+  Distal               25                   +------------------+--------+--------+-------+  +-----------------+--------+--------+-------+ Left Renal ArteryPSV cm/sEDV cm/sComment +-----------------+--------+--------+-------+ Proximal           168                   +-----------------+--------+--------+-------+ Mid                 72                    +-----------------+--------+--------+-------+ Distal             103                   +-----------------+--------+--------+-------+    +------------------+----+------------------+-----+ Right Kidney          Left Kidney             +------------------+----+------------------+-----+ RAR (manual)          RAR (manual)            +------------------+----+------------------+-----+ Kidney length (cm)5.80Kidney length (cm)11.50 +------------------+----+------------------+-----+  Summary: Renal:   Right: Abnormal size for the right kidney. No evidence of right        renal artery stenosis. RRV flow present. Small right kidney        as noted in previous exam on 12/28/2020. Left:  Normal size of left kidney. No evidence of left renal artery        stenosis. LRV flow present. Mid left renal cyst near lateral        border measuring approximatelt 1.76cm x 1.75cm x 2.06cm.   *See table(s) above for measurements and observations.   Diagnosing physician: Leotis Pain MD   Electronically signed by Leotis Pain MD on 07/02/2021 at 2:51:11 PM.       Final   VAS US CAROTID Carotid Arterial Duplex Study  Patient Name:  Tamara Finley  Date of Exam:   07/02/2021 Medical Rec #: [id number]       Accession #:    [id number] Date of Birth: Oct 28, 1940      Patient Gender: F Patient Age:   22 years Exam Location:  [facility] Procedure:      VAS US CAROTID Referring Phys: Leotis Pain  --------------------------------------------------------------------------------   Comparison Study:  12/28/2020  Performing Technologist: Charlane Ferretti RT (R)(VS)    Examination Guidelines: A complete evaluation includes B-mode imaging, spectral Doppler, color Doppler, and power Doppler as needed of all accessible portions of each vessel. Bilateral testing is considered an integral part of a complete examination. Limited examinations for reoccurring indications may be performed as  noted.    Right Carotid Findings: +----------+-------+--------+--------+---------------------+-------------------+           PSV    EDV cm/sStenosisPlaque Description   Comments                      cm/s                                                            +----------+-------+--------+--------+---------------------+-------------------+ CCA Prox  50     10  calcific and         intimal thickening                                   irregular                                +----------+-------+--------+--------+---------------------+-------------------+ CCA Mid   93     21              calcific and         intimal thickening                                   irregular                                +----------+-------+--------+--------+---------------------+-------------------+ CCA Distal114    27              calcific             intimal thickening  +----------+-------+--------+--------+---------------------+-------------------+ ICA Prox  171    40                                   ICA/CCA ratio =                                                           1.84                +----------+-------+--------+--------+---------------------+-------------------+ ICA Mid   104    22                                                       +----------+-------+--------+--------+---------------------+-------------------+ ICA Distal116    28                                                       +----------+-------+--------+--------+---------------------+-------------------+ ECA       222    16              calcific                                 +----------+-------+--------+--------+---------------------+-------------------+  +----------+--------+-------+----------------+-------------------+           PSV cm/sEDV cmsDescribe        Arm Pressure  (mmHG) +----------+--------+-------+----------------+-------------------+ [id number]             Multiphasic, WNL                    +----------+--------+-------+----------------+-------------------+  +---------+--------+--+--------+--+---------+ VertebralPSV cm/s89EDV cm/s15Antegrade +---------+--------+--+--------+--+---------+  Left Carotid Findings: +----------+--------+--------+--------+------------------+--------------------+  PSV cm/sEDV cm/sStenosisPlaque DescriptionComments             +----------+--------+--------+--------+------------------+--------------------+ CCA Prox  56      15              calcific          intimal thickening   +----------+--------+--------+--------+------------------+--------------------+ CCA Mid   69      20              calcific          intimal thickening   +----------+--------+--------+--------+------------------+--------------------+ CCA Distal111     25                                Stent                +----------+--------+--------+--------+------------------+--------------------+ ICA Prox  97      24                                Sent                 +----------+--------+--------+--------+------------------+--------------------+ ICA Mid   88      27                                ICA/CCA ratio = 1.41 +----------+--------+--------+--------+------------------+--------------------+ ICA Distal97      22                                                     +----------+--------+--------+--------+------------------+--------------------+ ECA       341     23                                                     +----------+--------+--------+--------+------------------+--------------------+  +----------+--------+--------+-----------+-------------------+           PSV cm/sEDV cm/sDescribe   Arm Pressure  (mmHG) +----------+--------+--------+-----------+-------------------+ [id number]             Multiphasic                    +----------+--------+--------+-----------+-------------------+  +---------+--------+--+--------+--+---------+ VertebralPSV cm/s89EDV cm/s17Antegrade +---------+--------+--+--------+--+---------+  Summary: Right Carotid: Velocities in the right ICA are consistent with a 40-59%                stenosis. The ECA appears >50% stenosed.  Left Carotid: Velocities in the left ICA are consistent with a 1-39% stenosis.               The ECA appears >50% stenosed.  Vertebrals:  Bilateral vertebral arteries demonstrate antegrade flow. Subclavians: Normal flow hemodynamics were seen in bilateral subclavian              arteries.  *See table(s) above for measurements and observations.    Electronically signed by Leotis Pain MD on 07/02/2021 at 2:46:07 PM.      Final   Note: Reviewed        Physical Exam  General appearance: Well nourished, well developed, and well hydrated. In no apparent acute distress  Mental status: Alert, oriented x 3 (person, place, & time)       Respiratory: No evidence of acute respiratory distress Eyes: PERLA Vitals: BP (!) 192/75   Pulse 67   Temp (!) 96.9 F (36.1 C) (Temporal)   Resp 15   Ht 5' 4"  (1.626 m)   Wt 180 lb (81.6 kg)   SpO2 97%   BMI 30.90 kg/m  BMI: Estimated body mass index is 30.9 kg/m as calculated from the following:   Height as of this encounter: 5' 4"  (1.626 m).   Weight as of this encounter: 180 lb (81.6 kg). Ideal: Ideal body weight: 54.7 kg (120 lb 9.5 oz) Adjusted ideal body weight: 65.5 kg (144 lb 5.7 oz)  Assessment   Status Diagnosis  Controlled Controlled Controlled 1. Chronic SI joint pain   2. Cervicalgia   3. Bilateral neck pain (right)   4. Lumbar spondylosis   5. Lumbar facet arthropathy (L4/L5 and L5/S1)   6. DDD (degenerative disc disease), cervical   7. Piriformis syndrome  of both sides   8. Chronic pain syndrome      Plan of Care    Ms. CATALENA STANHOPE has a current medication list which includes the following long-term medication(s): albuterol, azelastine, calcium 600+d plus minerals, carvedilol, furosemide, gabapentin, gabapentin, levothyroxine, levothyroxine, mirtazapine, montelukast, and pantoprazole.  Pharmacotherapy (Medications Ordered): Meds ordered this encounter  Medications   HYDROcodone-acetaminophen (NORCO/VICODIN) 5-325 MG tablet    Sig: Take 1 tablet by mouth 2 (two) times daily as needed for moderate pain.    Dispense:  60 tablet    Refill:  0   HYDROcodone-acetaminophen (NORCO/VICODIN) 5-325 MG tablet    Sig: Take 1 tablet by mouth 2 (two) times daily as needed for moderate pain.    Dispense:  60 tablet    Refill:  0   HYDROcodone-acetaminophen (NORCO/VICODIN) 5-325 MG tablet    Sig: Take 1 tablet by mouth 2 (two) times daily as needed for moderate pain.    Dispense:  60 tablet    Refill:  0   Continue gabapentin as prescribed. Follow-up plan:   Return in about 14 weeks (around 11/07/2021) for Medication Management, in person.     consider RFA in future, Sprint PNS of medial branch at L4          Recent Visits Date Type Provider Dept  06/12/21 Procedure visit Gillis Santa, MD [facility]  Showing recent visits within past 90 days and meeting all other requirements Today's Visits Date Type Provider Dept  08/01/21 Office Visit Gillis Santa, MD [facility]  Showing today's visits and meeting all other requirements Future Appointments Date Type Provider Dept  10/29/21 Appointment Gillis Santa, MD [facility]  Showing future appointments within next 90 days and meeting all other requirements I discussed the assessment and treatment plan with the patient. The patient was provided an opportunity to ask questions and all were answered. The patient agreed with the plan and demonstrated an understanding  of the instructions.  Patient advised to call back or seek an in-person evaluation if the symptoms or condition worsens.  Duration of encounter: 21mnutes.  Note by: BGillis Santa MD Date: 08/01/2021; Time: 1:53 PM

## 2021-08-01 NOTE — Progress Notes (Signed)
Nursing Pain Medication Assessment:  Safety precautions to be maintained throughout the outpatient stay will include: orient to surroundings, keep bed in low position, maintain call bell within reach at all times, provide assistance with transfer out of bed and ambulation.  Medication Inspection Compliance: Pill count conducted under aseptic conditions, in front of the patient. Neither the pills nor the bottle was removed from the patient's sight at any time. Once count was completed pills were immediately returned to the patient in their original bottle.  Medication: Hydrocodone/APAP Pill/Patch Count:  24 of 60 pills remain Pill/Patch Appearance: Markings consistent with prescribed medication Bottle Appearance: Standard pharmacy container. Clearly labeled. Filled Date: 60 / 09 / 2022 Last Medication intake:  Yesterday

## 2021-09-03 DIAGNOSIS — R600 Localized edema: Secondary | ICD-10-CM | POA: Diagnosis not present

## 2021-09-03 DIAGNOSIS — D631 Anemia in chronic kidney disease: Secondary | ICD-10-CM | POA: Diagnosis not present

## 2021-09-03 DIAGNOSIS — N1831 Chronic kidney disease, stage 3a: Secondary | ICD-10-CM | POA: Diagnosis not present

## 2021-09-03 DIAGNOSIS — I701 Atherosclerosis of renal artery: Secondary | ICD-10-CM | POA: Diagnosis not present

## 2021-09-23 DIAGNOSIS — D631 Anemia in chronic kidney disease: Secondary | ICD-10-CM | POA: Diagnosis not present

## 2021-09-23 DIAGNOSIS — N1831 Chronic kidney disease, stage 3a: Secondary | ICD-10-CM | POA: Diagnosis not present

## 2021-09-23 DIAGNOSIS — I150 Renovascular hypertension: Secondary | ICD-10-CM | POA: Diagnosis not present

## 2021-09-23 DIAGNOSIS — R739 Hyperglycemia, unspecified: Secondary | ICD-10-CM | POA: Diagnosis not present

## 2021-09-23 DIAGNOSIS — E782 Mixed hyperlipidemia: Secondary | ICD-10-CM | POA: Diagnosis not present

## 2021-10-07 DIAGNOSIS — E039 Hypothyroidism, unspecified: Secondary | ICD-10-CM | POA: Diagnosis not present

## 2021-10-07 DIAGNOSIS — Z9889 Other specified postprocedural states: Secondary | ICD-10-CM | POA: Diagnosis not present

## 2021-10-07 DIAGNOSIS — E782 Mixed hyperlipidemia: Secondary | ICD-10-CM | POA: Diagnosis not present

## 2021-10-07 DIAGNOSIS — I771 Stricture of artery: Secondary | ICD-10-CM | POA: Diagnosis not present

## 2021-10-07 DIAGNOSIS — I150 Renovascular hypertension: Secondary | ICD-10-CM | POA: Diagnosis not present

## 2021-10-07 DIAGNOSIS — I739 Peripheral vascular disease, unspecified: Secondary | ICD-10-CM | POA: Diagnosis not present

## 2021-10-07 DIAGNOSIS — J440 Chronic obstructive pulmonary disease with acute lower respiratory infection: Secondary | ICD-10-CM | POA: Diagnosis not present

## 2021-10-07 DIAGNOSIS — N2581 Secondary hyperparathyroidism of renal origin: Secondary | ICD-10-CM | POA: Diagnosis not present

## 2021-10-07 DIAGNOSIS — M810 Age-related osteoporosis without current pathological fracture: Secondary | ICD-10-CM | POA: Diagnosis not present

## 2021-10-08 DIAGNOSIS — Z0100 Encounter for examination of eyes and vision without abnormal findings: Secondary | ICD-10-CM | POA: Diagnosis not present

## 2021-10-08 DIAGNOSIS — H5203 Hypermetropia, bilateral: Secondary | ICD-10-CM | POA: Diagnosis not present

## 2021-10-10 ENCOUNTER — Telehealth: Payer: Self-pay | Admitting: Student in an Organized Health Care Education/Training Program

## 2021-10-10 NOTE — Telephone Encounter (Signed)
Patient is unable to get Hydrocodone due to shortage. Please call daughter Kieth Brightly  and advise [phone]

## 2021-10-11 ENCOUNTER — Telehealth: Payer: Self-pay

## 2021-10-11 DIAGNOSIS — G894 Chronic pain syndrome: Secondary | ICD-10-CM

## 2021-10-11 NOTE — Telephone Encounter (Signed)
Message sent to Dr Lateef 

## 2021-10-11 NOTE — Telephone Encounter (Signed)
Patient due to fill Hydrocodone on 10-09-21.   [facility] is out of Hydrocodone 5/325 mg.  Called [facility] and they states that they have the Hydrocodone available.  [place] added to patient pharmacy list.  Dr Holley Raring notified and states he will take care of this on Monday.  Patients daughter notified.

## 2021-10-15 MED ORDER — HYDROCODONE-ACETAMINOPHEN 5-325 MG PO TABS: 1.0000 | ORAL_TABLET | Freq: Two times a day (BID) | ORAL | 0 refills | Status: DC | PRN

## 2021-10-16 ENCOUNTER — Telehealth: Payer: Self-pay

## 2021-10-16 NOTE — Telephone Encounter (Signed)
Called patient to let her know that [facility] has sent in the hydrocodone to [facility]. Spoke with her daughter Kieth Brightly.

## 2021-10-16 NOTE — Telephone Encounter (Signed)
She said she spoke with someone Friday about her mothers pharmacy not having her hydrocodone,  and they assured her that it would be taken care of, but she has not heard anything. She only has a couple left. She wants someone to call her with the status.

## 2021-10-28 DIAGNOSIS — N6489 Other specified disorders of breast: Secondary | ICD-10-CM | POA: Diagnosis not present

## 2021-10-28 DIAGNOSIS — C50212 Malignant neoplasm of upper-inner quadrant of left female breast: Secondary | ICD-10-CM | POA: Diagnosis not present

## 2021-10-29 ENCOUNTER — Other Ambulatory Visit: Payer: Self-pay

## 2021-10-29 ENCOUNTER — Encounter: Payer: Self-pay | Admitting: Student in an Organized Health Care Education/Training Program

## 2021-10-29 ENCOUNTER — Ambulatory Visit
Payer: Medicare HMO | Attending: Student in an Organized Health Care Education/Training Program | Admitting: Student in an Organized Health Care Education/Training Program

## 2021-10-29 VITALS — BP 178/90 | HR 73 | Temp 98.6°F | Resp 16 | Ht 64.0 in | Wt 186.0 lb

## 2021-10-29 DIAGNOSIS — M47816 Spondylosis without myelopathy or radiculopathy, lumbar region: Secondary | ICD-10-CM

## 2021-10-29 DIAGNOSIS — G894 Chronic pain syndrome: Secondary | ICD-10-CM | POA: Diagnosis not present

## 2021-10-29 DIAGNOSIS — G8929 Other chronic pain: Secondary | ICD-10-CM

## 2021-10-29 DIAGNOSIS — M542 Cervicalgia: Secondary | ICD-10-CM

## 2021-10-29 DIAGNOSIS — M533 Sacrococcygeal disorders, not elsewhere classified: Secondary | ICD-10-CM | POA: Insufficient documentation

## 2021-10-29 MED ORDER — HYDROCODONE-ACETAMINOPHEN 5-325 MG PO TABS: 1.0000 | ORAL_TABLET | Freq: Two times a day (BID) | ORAL | 0 refills | Status: AC | PRN

## 2021-10-29 MED ORDER — HYDROCODONE-ACETAMINOPHEN 5-325 MG PO TABS: 1.0000 | ORAL_TABLET | Freq: Two times a day (BID) | ORAL | 0 refills | Status: DC | PRN

## 2021-10-29 MED ORDER — GABAPENTIN 300 MG PO CAPS: 600.0000 mg | ORAL_CAPSULE | Freq: Every day | ORAL | 2 refills | Status: DC

## 2021-10-29 NOTE — Progress Notes (Signed)
PROVIDER NOTE: Information contained herein reflects review and annotations entered in association with encounter. Interpretation of such information and data should be left to medically-trained personnel. Information provided to patient can be located elsewhere in the medical record under "Patient Instructions". Document created using STT-dictation technology, any transcriptional errors that may result from process are unintentional.    Patient: Tamara Finley  Service Category: E/M  Provider: Gillis Santa, MD  DOB: 14-Jul-1941  DOS: 10/29/2021  Specialty: Interventional Pain Management  MRN: [id number]  Setting: Ambulatory outpatient  PCP: Adin Hector, MD  Type: Established Patient    Referring Provider: Adin Hector, MD  Location: Office  Delivery: Face-to-face     HPI  Tamara Finley, a 81 y.o. year old female, is here today because of her Chronic pain syndrome [G89.4]. Tamara Finley primary complain today is Hip Pain (right)  Last encounter: My last encounter with her was on 08/01/21 Pertinent problems: Tamara Finley has Low back pain radiating to both legs; Peripheral arterial disease ([place]); Right hip pain; Chronic SI joint pain; Lumbar facet arthropathy (L4/L5 and L5/S1); Controlled substance agreement signed; Lumbar spondylosis; and Lumbar radiculopathy on their pertinent problem list. Pain Assessment: Severity of Chronic pain is reported as a 6 /10. Location: Hip Right/radiates into right leg to ankle. Onset: More than a month ago. Quality: Sharp. Timing: Intermittent. Modifying factor(s): sitting or laying. Vitals:  height is 5' 4"  (1.626 m) and weight is 186 lb (84.4 kg). Her temporal temperature is 98.6 F (37 C). Her blood pressure is 178/90 (abnormal) and her pulse is 73. Her respiration is 16 and oxygen saturation is 95%.   Reason for encounter: medication management.   No change in medical history since last visit.  Patient's pain is at baseline.  Patient continues  multimodal pain regimen as prescribed.  States that it provides pain relief and improvement in functional status. Unfortunately, the patient has been under greater stress as her husband's lymphoma has recurred.  He is currently being managed at [facility]. She had her surveillance mammogram yesterday Patient is utilizing Dulcolax & magnesium for constipation  Pharmacotherapy Assessment  Analgesic: Hydrocodone 5 mg two times daily prn, quantity 60/month    Monitoring: [place] PMP: PDMP reviewed during this encounter.       Pharmacotherapy: No side-effects or adverse reactions reported. Compliance: No problems identified. Effectiveness: Clinically acceptable.  No notes on file   UDS:  Summary  Date Value Ref Range Status  04/30/2021 Note  Final    Comment:    ==================================================================== [employer] Select 13 (MW) ==================================================================== Test                             Result       Flag       Units  Drug Present and Declared for Prescription Verification   Norhydrocodone                 223          EXPECTED   ng/mg creat    Norhydrocodone is an expected metabolite of hydrocodone.  Drug Absent but Declared for Prescription Verification   Hydrocodone                    Not Detected UNEXPECTED ng/mg creat    Hydrocodone is almost always present in patients taking this drug    consistently. Absence of hydrocodone could be due to lapse of  time    since the last dose or unusual pharmacokinetics (rapid metabolism).  ==================================================================== Test                      Result    Flag   Units      Ref Range   Creatinine              26               mg/dL      >=20 ==================================================================== Declared Medications:  The flagging and interpretation on this report are based on the  following declared medications.  Unexpected results  may arise from  inaccuracies in the declared medications.   **Note: The testing scope of this panel includes these medications:   Hydrocodone (Norco)   **Note: The testing scope of this panel does not include the  following reported medications:   Acetaminophen (Tylenol)  Acetaminophen (Norco)  Albuterol (Ventolin HFA)  Anastrozole (Arimidex)  Aspirin  Atorvastatin (Lipitor)  Azelastine (Astelin)  Calcium  Carvedilol (Coreg)  Cyanocobalamin  Fluticasone (Flonase)  Furosemide (Lasix)  Gabapentin (Neurontin)  Hydralazine (Apresoline)  Levothyroxine (Synthroid)  Mirtazapine (Remeron)  Montelukast (Singulair)  Pantoprazole (Protonix)  Potassium (Klor-Con)  Vitamin D ==================================================================== For clinical consultation, please call [phone]. ====================================================================      ROS  Constitutional: Denies any fever or chills Gastrointestinal: No reported hemesis, hematochezia, vomiting, or acute GI distress Musculoskeletal:  low back pain Neurological: No reported episodes of acute onset apraxia, aphasia, dysarthria, agnosia, amnesia, paralysis, loss of coordination, or loss of consciousness  Medication Review  Calcium 600+D Plus Minerals, DSS, HYDROcodone-acetaminophen, acetaminophen, albuterol, anastrozole, aspirin, atorvastatin, azelastine, carvedilol, fluticasone, furosemide, gabapentin, hydrALAZINE, levothyroxine, mirtazapine, montelukast, pantoprazole, potassium chloride SA, and vitamin B-12  History Review  Allergy: Tamara Finley is allergic to benadryl [diphenhydramine hcl (sleep)] and alprazolam. Drug: Tamara Finley  reports no history of drug use. Alcohol:  reports no history of alcohol use. Tobacco:  reports that she quit smoking about 8 years ago. Her smoking use included cigarettes. She has a 100.00 pack-year smoking history. She has never used smokeless tobacco. Social: Tamara Finley   reports that she quit smoking about 8 years ago. Her smoking use included cigarettes. She has a 100.00 pack-year smoking history. She has never used smokeless tobacco. She reports that she does not drink alcohol and does not use drugs. Medical:  has a past medical history of Abdominal aortic aneurysm (AAA) (2000's), Age related entropion, unspecified laterality, Anxiety, Anxiety and depression, Bilateral renal artery stenosis ([place]), Breast calcification, left, Cancer ([place]) (10/20/2016), Carotid artery thrombosis, bilateral, Carotid stenosis, Chronic bronchitis (HCC), Chronic insomnia, COPD (chronic obstructive pulmonary disease) ([place]), Coronary artery disease, CRI (chronic renal insufficiency), stage 3 (moderate) ([place]), Depression, Diabetes mellitus without complication ([place]), GERD (gastroesophageal reflux disease), Heart palpitations, History of AAA (abdominal aortic aneurysm) repair, History of colon polyps, History of TIA (transient ischemic attack), Hypercholesterolemia, Hypertension, Hypothyroidism, Migraine headache, Osteoarthritis, Osteoporosis, Peripheral arterial disease ([place]), Pulmonary scarring, Renal disorder, SOB (shortness of breath), 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 Surgical: Tamara Finley  has a past surgical history that includes Cardiac surgery; Abdominal aortic aneurysm repair; Cardiac catheterization (N/A, 12/05/2015); Cardiac catheterization (Left, 12/31/2015); Breast excisional biopsy (Left, 2010); Breast excisional biopsy (Left, 07/01/2006); Breast biopsy (Left, 09/03/2016); Breast biopsy (Left, 09/25/2016); Bronchoscopy; CAROTID PTA/STENT INTERVENTION; Colonoscopy; Eye surgery; Laminectomy (07/25/2013); Back surgery; Mediastinoscopy; Mastectomy; Thoracoscopy (12/16/2016);  and Colonoscopy with propofol (N/A, 06/15/2017). Family: family history includes CVA in her  mother; Cancer in her brother; Coronary artery disease in her father; Dementia in her father; Diabetes in her brother; Epilepsy in her mother; Hypertension in her father and mother; Ovarian cancer in her sister; Stroke in her father and mother.  Laboratory Chemistry Profile   Renal Lab Results  Component Value Date   BUN 9 01/01/2016   CREATININE 1.03 (H) 01/01/2016   GFRAA >60 01/01/2016   GFRNONAA 53 (L) 01/01/2016    Hepatic Lab Results  Component Value Date   AST 28 10/17/2015   ALT 17 10/17/2015   ALBUMIN 3.7 10/17/2015   ALKPHOS 64 10/17/2015    Electrolytes Lab Results  Component Value Date   NA 139 01/01/2016   K 3.5 01/01/2016   CL 106 01/01/2016   CALCIUM 8.2 (L) 01/01/2016    Bone No results found for: VD25OH, [id number], VO3500XF8, HW2993ZJ6, 25OHVITD1, 25OHVITD2, 25OHVITD3, TESTOFREE, TESTOSTERONE  Inflammation (CRP: Acute Phase) (ESR: Chronic Phase) Lab Results  Component Value Date   ESRSEDRATE 23 10/12/2015         Note: Above Lab results reviewed.  Recent Imaging Review  VAS US RENAL ARTERY DUPLEX ABDOMINAL VISCERAL  Patient Name:  DONNETTA GILLIN  Date of Exam:   07/02/2021 Medical Rec #: [id number]       Accession #:    [id number] Date of Birth: 07/09/1941      Patient Gender: F Patient Age:   85 years [facility]am [facility] Procedure:      VAS US RENAL ARTERY DUPLEX Referring Phys: [id number] JASON S DEW  --------------------------------------------------------------------------------  Limitations: Air/bowel gas and obesity. Comparison Study: 12/28/2020  Performing Technologist: Charlane Ferretti RT (R)(VS)    Examination Guidelines: A complete evaluation includes B-mode imaging, spectral Doppler, color Doppler, and power Doppler as needed of all accessible portions of each vessel. Bilateral testing is considered an integral part of a complete examination. Limited examinations for reoccurring indications may be performed as  noted.    Duplex Findings: +----------+--------+--------+------+--------+  Mesenteric PSV cm/s EDV cm/s Plaque Comments  +----------+--------+--------+------+--------+  Aorta Mid     65                              +----------+--------+--------+------+--------+    +------------------+--------+--------+-------+  Right Renal Artery PSV cm/s EDV cm/s Comment  +------------------+--------+--------+-------+  Proximal              37                      +------------------+--------+--------+-------+  Mid                   36                      +------------------+--------+--------+-------+  Distal                25                      +------------------+--------+--------+-------+  +-----------------+--------+--------+-------+  Left Renal Artery PSV cm/s EDV cm/s Comment  +-----------------+--------+--------+-------+  Proximal            168                      +-----------------+--------+--------+-------+  Mid  72                      +-----------------+--------+--------+-------+  Distal              103                      +-----------------+--------+--------+-------+    +------------------+----+------------------+-----+  Right Kidney            Left Kidney               +------------------+----+------------------+-----+  RAR (manual)            RAR (manual)              +------------------+----+------------------+-----+  Kidney length (cm) 5.80 Kidney length (cm) 11.50  +------------------+----+------------------+-----+  Summary: Renal:   Right: Abnormal size for the right kidney. No evidence of right        renal artery stenosis. RRV flow present. Small right kidney        as noted in previous exam on 12/28/2020. Left:  Normal size of left kidney. No evidence of left renal artery        stenosis. LRV flow present. Mid left renal cyst near lateral        border measuring approximatelt 1.76cm x 1.75cm x 2.06cm.   *See table(s) above for measurements  and observations.   Diagnosing physician: Leotis Pain MD   Electronically signed by Leotis Pain MD on 07/02/2021 at 2:51:11 PM.       Final   VAS US CAROTID Carotid Arterial Duplex Study  Patient Name:  Tamara Finley  Date of Exam:   07/02/2021 Medical Rec #: [id number]       Accession #:    [id number] Date of Birth: May 24, 1941      Patient Gender: F Patient Age:   51 years Exam Location:  [facility] Procedure:      VAS US CAROTID Referring Phys: Leotis Pain  --------------------------------------------------------------------------------   Comparison Study:  12/28/2020  Performing Technologist: Charlane Ferretti RT (R)(VS)    Examination Guidelines: A complete evaluation includes B-mode imaging, spectral Doppler, color Doppler, and power Doppler as needed of all accessible portions of each vessel. Bilateral testing is considered an integral part of a complete examination. Limited examinations for reoccurring indications may be performed as noted.    Right Carotid Findings: +----------+-------+--------+--------+---------------------+-------------------+             PSV     EDV cm/s Stenosis Plaque Description    Comments                         cm/s                                                                 +----------+-------+--------+--------+---------------------+-------------------+  CCA Prox   50      10                calcific and          intimal thickening  irregular                                  +----------+-------+--------+--------+---------------------+-------------------+  CCA Mid    93      21                calcific and          intimal thickening                                         irregular                                  +----------+-------+--------+--------+---------------------+-------------------+  CCA Distal 114     27                calcific              intimal thickening    +----------+-------+--------+--------+---------------------+-------------------+  ICA Prox   171     40                                      ICA/CCA ratio =                                                                  1.84                 +----------+-------+--------+--------+---------------------+-------------------+  ICA Mid    104     22                                                           +----------+-------+--------+--------+---------------------+-------------------+  ICA Distal 116     28                                                           +----------+-------+--------+--------+---------------------+-------------------+  ECA        222     16                calcific                                   +----------+-------+--------+--------+---------------------+-------------------+  +----------+--------+-------+----------------+-------------------+             PSV cm/s EDV cms Describe         Arm Pressure (mmHG)  +----------+--------+-------+----------------+-------------------+  Subclavian 71               Multiphasic, WNL                      +----------+--------+-------+----------------+-------------------+  +---------+--------+--+--------+--+---------+  Vertebral PSV cm/s 89 EDV cm/s 15 Antegrade  +---------+--------+--+--------+--+---------+  Left Carotid Findings: +----------+--------+--------+--------+------------------+--------------------+             PSV cm/s EDV cm/s Stenosis Plaque Description Comments              +----------+--------+--------+--------+------------------+--------------------+  CCA Prox   56       15                calcific           intimal thickening    +----------+--------+--------+--------+------------------+--------------------+  CCA Mid    69       20                calcific           intimal thickening    +----------+--------+--------+--------+------------------+--------------------+  CCA Distal 111      25                                    Stent                 +----------+--------+--------+--------+------------------+--------------------+  ICA Prox   97       24                                   Sent                  +----------+--------+--------+--------+------------------+--------------------+  ICA Mid    88       27                                   ICA/CCA ratio = 1.41  +----------+--------+--------+--------+------------------+--------------------+  ICA Distal 97       22                                                         +----------+--------+--------+--------+------------------+--------------------+  ECA        341      23                                                         +----------+--------+--------+--------+------------------+--------------------+  +----------+--------+--------+-----------+-------------------+             PSV cm/s EDV cm/s Describe    Arm Pressure (mmHG)  +----------+--------+--------+-----------+-------------------+  Subclavian 153               Multiphasic                      +----------+--------+--------+-----------+-------------------+  +---------+--------+--+--------+--+---------+  Vertebral PSV cm/s 89 EDV cm/s 17 Antegrade  +---------+--------+--+--------+--+---------+  Summary: Right Carotid: Velocities in the right ICA are consistent with a 40-59%                stenosis. The ECA appears >50% stenosed.  Left Carotid: Velocities in the left ICA are consistent with a 1-39% stenosis.  The ECA appears >50% stenosed.  Vertebrals:  Bilateral vertebral arteries demonstrate antegrade flow. Subclavians: Normal flow hemodynamics were seen in bilateral subclavian              arteries.  *See table(s) above for measurements and observations.    Electronically signed by Leotis Pain MD on 07/02/2021 at 2:46:07 PM.      Final    Note: Reviewed        Physical Exam  General appearance: Well nourished, well developed, and well hydrated. In no apparent acute  distress Mental status: Alert, oriented x 3 (person, place, & time)       Respiratory: No evidence of acute respiratory distress Eyes: PERLA Vitals: BP (!) 178/90    Pulse 73    Temp 98.6 F (37 C) (Temporal)    Resp 16    Ht 5' 4"  (1.626 m)    Wt 186 lb (84.4 kg)    SpO2 95%    BMI 31.93 kg/m  BMI: Estimated body mass index is 31.93 kg/m as calculated from the following:   Height as of this encounter: 5' 4"  (1.626 m).   Weight as of this encounter: 186 lb (84.4 kg). Ideal: Ideal body weight: 54.7 kg (120 lb 9.5 oz) Adjusted ideal body weight: 66.6 kg (146 lb 12.1 oz)  Increased low back pain, SI joint pain  Assessment   Status Diagnosis  Controlled Controlled Controlled 1. Chronic pain syndrome   2. Chronic SI joint pain   3. Cervicalgia   4. Bilateral neck pain (right)   5. Lumbar spondylosis   6. Lumbar facet arthropathy (L4/L5 and L5/S1)      Plan of Care    Tamara Finley has a current medication list which includes the following long-term medication(s): albuterol, azelastine, calcium 600+d plus minerals, carvedilol, furosemide, levothyroxine, levothyroxine, mirtazapine, montelukast, pantoprazole, and gabapentin.  Pharmacotherapy (Medications Ordered): Meds ordered this encounter  Medications   HYDROcodone-acetaminophen (NORCO/VICODIN) 5-325 MG tablet    Sig: Take 1 tablet by mouth 2 (two) times daily as needed for moderate pain.    Dispense:  60 tablet    Refill:  0   HYDROcodone-acetaminophen (NORCO/VICODIN) 5-325 MG tablet    Sig: Take 1 tablet by mouth 2 (two) times daily as needed for moderate pain.    Dispense:  60 tablet    Refill:  0   HYDROcodone-acetaminophen (NORCO/VICODIN) 5-325 MG tablet    Sig: Take 1 tablet by mouth 2 (two) times daily as needed for moderate pain.    Dispense:  60 tablet    Refill:  0   gabapentin (NEURONTIN) 300 MG capsule    Sig: Take 2 capsules (600 mg total) by mouth at bedtime.    Dispense:  90 capsule    Refill:  2    Continue gabapentin as prescribed. Follow-up plan:   Return in about 3 months (around 02/05/2022) for Medication Management, in person.     consider RFA in future, Sprint PNS of medial branch at L4          Recent Visits Date Type Provider Dept  08/01/21 Office Visit Gillis Santa, MD [facility]  Showing recent visits within past 90 days and meeting all other requirements Today's Visits Date Type Provider Dept  10/29/21 Office Visit Gillis Santa, MD [facility]  Showing today's visits and meeting all other requirements Future Appointments No visits were found meeting these conditions. Showing future appointments within next 90 days and meeting all other  requirements  I discussed the assessment and treatment plan with the patient. The patient was provided an opportunity to ask questions and all were answered. The patient agreed with the plan and demonstrated an understanding of the instructions.  Patient advised to call back or seek an in-person evaluation if the symptoms or condition worsens.  Duration of encounter: 65mnutes.  Note by: BGillis Santa MD Date: 10/29/2021; Time: 9:58 AM

## 2021-10-29 NOTE — Progress Notes (Deleted)
Patient: Tamara Finley  Service Category: E/M  Provider: Gillis Santa, MD  DOB: 16-May-1941  DOS: 10/29/2021  Location: Office  MRN: [id number]  Setting: Ambulatory outpatient  Referring Provider: Adin Hector, MD  Type: Established Patient  Specialty: Interventional Pain Management  PCP: Adin Hector, MD  Location: {Blank single:19197::"Office","Ambulating","Shop","Work","Car","Home","Outdoors","Away from [facility] else","Remote location"}  Delivery: TeleHealth     Virtual Encounter - Pain Management PROVIDER NOTE: Information contained herein reflects review and annotations entered in association with encounter. Interpretation of such information and data should be left to medically-trained personnel. Information provided to patient can be located elsewhere in the medical record under "Patient Instructions". Document created using STT-dictation technology, any transcriptional errors that may result from process are unintentional.    Contact & Pharmacy Preferred: [phone] Home: [phone] (home) Mobile: [phone] (mobile) E-mail: [email or web address]  [facility] [place], [address] [address] Phone: [phone] Fax: [phone]   Pre-screening  Tamara Finley offered "in-person" vs "virtual" encounter. She indicated preferring virtual for this encounter.   Reason {Blank single:19197::"COVID-19*   Social distancing based on CDC and AMA recommendations."}   I {Blank single:19197::"initiated contact with","attempted to contact","contacted"} Tamara Finley on 10/29/2021 via {Blank single:19197::"video conference","telephone"}. {Blank single:19197::"I was unable to complete the visit due to no one answering the phone, or unable to get a proper contact number. I was also unable to leave a message","I was transferred to an answering service where I left a message  to reschedule the call.","     "}I clearly identified myself as Gillis Santa, MD. I verified that I was speaking with the correct person using two identifiers (Name: Tamara Finley, and date of birth: 1941-01-09).  Consent I sought verbal advanced consent from Tamara Finley for virtual visit interactions. I informed Tamara Finley of possible security and privacy concerns, risks, and limitations associated with providing "not-in-person" medical evaluation and management services. I also informed Tamara Finley of the availability of "in-person" appointments. Finally, I informed her that there would be a charge for the virtual visit and that she could be  personally, fully or partially, financially responsible for it. {Blank single:19197::"Tamara Finley expressed understanding and decided not to proceed. Virtual visit terminated.","Tamara Finley expressed understanding and agreed to proceed."}   Historic Elements   Tamara Finley is a 81 y.o. year old, female patient evaluated today after our last contact on 10/10/2021. Tamara Finley  has a past medical history of Abdominal aortic aneurysm (AAA) (2000's), Age related entropion, unspecified laterality, Anxiety, Anxiety and depression, Bilateral renal artery stenosis ([place]), Breast calcification, left, Cancer ([place]) (10/20/2016), Carotid artery thrombosis, bilateral, Carotid stenosis, Chronic bronchitis ([place]), Chronic insomnia, COPD (chronic obstructive pulmonary disease) ([place]), Coronary artery disease, CRI (chronic renal insufficiency), stage 3 (moderate) ([place]), Depression, Diabetes mellitus without complication ([place]), GERD (gastroesophageal reflux disease), Heart palpitations, History of AAA (abdominal aortic aneurysm) repair, History of colon polyps, History of TIA (transient ischemic attack), Hypercholesterolemia, Hypertension, Hypothyroidism, Migraine headache, Osteoarthritis, Osteoporosis, Peripheral arterial disease ([place]), Pulmonary scarring, Renal disorder, SOB  (shortness of breath), Stenosis of left subclavian artery ([place]), Stroke ([place]), Thyroid disease, TIA (transient ischemic attack), and Type 2 diabetes mellitus with vascular disease ([place]). She also  has a past surgical history that includes Cardiac surgery; Abdominal aortic aneurysm repair; Cardiac catheterization (N/A, 12/05/2015); Cardiac catheterization (Left, 12/31/2015); Breast excisional biopsy (Left, 2010); Breast excisional biopsy (Left, 07/01/2006); Breast  biopsy (Left, 09/03/2016); Breast biopsy (Left, 09/25/2016); Bronchoscopy; CAROTID PTA/STENT INTERVENTION; Colonoscopy; Eye surgery; Laminectomy (07/25/2013); Back surgery; Mediastinoscopy; Mastectomy; Thoracoscopy (12/16/2016); and Colonoscopy with propofol (N/A, 06/15/2017). Tamara Finley has a current medication list which includes the following prescription(s): acetaminophen, albuterol, anastrozole, aspirin, atorvastatin, azelastine, calcium 600+d plus minerals, carvedilol, dss, fluticasone, furosemide, gabapentin, gabapentin, hydralazine, hydrocodone-acetaminophen, levothyroxine, levothyroxine, mirtazapine, montelukast, pantoprazole, potassium chloride sa, and vitamin b-12. She  reports that she quit smoking about 8 years ago. Her smoking use included cigarettes. She has a 100.00 pack-year smoking history. She has never used smokeless tobacco. She reports that she does not drink alcohol and does not use drugs. Tamara Finley is allergic to benadryl [diphenhydramine hcl (sleep)] and alprazolam.   HPI  Today, she is being contacted for {Blank single:19197::"medication management.","a post-procedure assessment.","both, medication management and a post-procedure assessment.","worsening of previously known (established) problem","follow-up evaluation","new problems."," *** "}  Pharmacotherapy Assessment   Opioid Analgesic: Hydrocodone 5 mg 3 times daily prn, quantity 75/month    Monitoring: [place] PMP: PDMP not reviewed this encounter. 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 UDS:  Summary  Date Value Ref Range Status  04/30/2021 Note  Final    Comment:    ==================================================================== [employer] Select 13 (MW) ==================================================================== Test                             Result       Flag       Units  Drug Present and Declared for Prescription Verification   Norhydrocodone                 223          EXPECTED   ng/mg creat    Norhydrocodone is an expected metabolite of hydrocodone.  Drug Absent but Declared for Prescription Verification   Hydrocodone                    Not Detected UNEXPECTED ng/mg creat    Hydrocodone is almost always present in patients taking this drug    consistently. Absence of hydrocodone could be due to lapse of time    since the last dose or unusual pharmacokinetics (rapid metabolism).  ==================================================================== Test                      Result    Flag   Units      Ref Range   Creatinine              26               mg/dL      >=20 ==================================================================== Declared Medications:  The flagging and interpretation on this report are based on the  following declared medications.  Unexpected results may arise from  inaccuracies in the declared medications.   **Note: The testing scope of this panel includes these medications:   Hydrocodone (Norco)   **Note: The  testing scope of this panel does not include the  following reported medications:   Acetaminophen (Tylenol)  Acetaminophen (Norco)  Albuterol (Ventolin HFA)  Anastrozole (Arimidex)  Aspirin  Atorvastatin (Lipitor)  Azelastine (Astelin)  Calcium  Carvedilol (Coreg)  Cyanocobalamin  Fluticasone (Flonase)  Furosemide (Lasix)  Gabapentin (Neurontin)  Hydralazine (Apresoline)  Levothyroxine (Synthroid)  Mirtazapine (Remeron)  Montelukast (Singulair)  Pantoprazole (Protonix)  Potassium (Klor-Con)  Vitamin D ==================================================================== For clinical consultation, please call [phone]. ====================================================================      Laboratory Chemistry Profile   Renal Lab Results  Component Value Date   BUN 9 01/01/2016   CREATININE 1.03 (H) 01/01/2016   GFRAA >60 01/01/2016   GFRNONAA 53 (L) 01/01/2016    Hepatic Lab Results  Component Value Date   AST 28 10/17/2015   ALT 17 10/17/2015   ALBUMIN 3.7 10/17/2015   ALKPHOS 64 10/17/2015    Electrolytes Lab Results  Component Value Date   NA 139 01/01/2016   K 3.5 01/01/2016   CL 106 01/01/2016   CALCIUM 8.2 (L) 01/01/2016    Bone No results found for: VD25OH, VD125OH2TOT, MC9470JG2, EZ6629UT6, 25OHVITD1, 25OHVITD2, 25OHVITD3, TESTOFREE, TESTOSTERONE  Inflammation (CRP: Acute Phase) (ESR: Chronic Phase) Lab Results  Component Value Date   ESRSEDRATE 23 10/12/2015         Note: {Blank single:19197::"Tamara Finley indicates labs done and monitored by primary care practitioner using a non-[facility] EMR system","No results found under the [employer] electronic medical record","Results made available to patient.","Lab results reviewed and made available to patient.","Lab results reviewed and explained to patient in Layman's terms.","Above Lab results reviewed."}  Imaging  VAS US RENAL ARTERY [place]  Patient Name:  Tamara Finley   Date of Exam:   07/02/2021 Medical Rec #: [id number]       Accession #:    [id number] Date of Birth: November 20, 1940      Patient Gender: F Patient Age:   89 years Exam Location:  [facility] Procedure:      VAS US RENAL ARTERY DUPLEX Referring Phys: [id number] JASON S DEW  --------------------------------------------------------------------------------  Limitations: Air/bowel gas and obesity. Comparison Study: 12/28/2020  Performing Technologist: Charlane Ferretti RT (R)(VS)    Examination Guidelines: A complete evaluation includes B-mode imaging, spectral Doppler, color Doppler, and power Doppler as needed of all accessible portions of each vessel. Bilateral testing is considered an integral part of a complete examination. Limited examinations for reoccurring indications may be performed as noted.    Duplex Findings: +----------+--------+--------+------+--------+  Mesenteric PSV cm/s EDV cm/s Plaque Comments  +----------+--------+--------+------+--------+  Aorta Mid     65                              +----------+--------+--------+------+--------+    +------------------+--------+--------+-------+  Right Renal Artery PSV cm/s EDV cm/s Comment  +------------------+--------+--------+-------+  Proximal              37                      +------------------+--------+--------+-------+  Mid                   36                      +------------------+--------+--------+-------+  Distal                25                      +------------------+--------+--------+-------+  +-----------------+--------+--------+-------+  Left Renal Artery PSV cm/s EDV cm/s Comment  +-----------------+--------+--------+-------+  Proximal            168                      +-----------------+--------+--------+-------+  Mid                  72                      +-----------------+--------+--------+-------+  Distal              103                       +-----------------+--------+--------+-------+    +------------------+----+------------------+-----+  Right Kidney            Left Kidney               +------------------+----+------------------+-----+  RAR (manual)            RAR (manual)              +------------------+----+------------------+-----+  Kidney length (cm) 5.80 Kidney length (cm) 11.50  +------------------+----+------------------+-----+  Summary: Renal:   Right: Abnormal size for the right kidney. No evidence of right        renal artery stenosis. RRV flow present. Small right kidney        as noted in previous exam on 12/28/2020. Left:  Normal size of left kidney. No evidence of left renal artery        stenosis. LRV flow present. Mid left renal cyst near lateral        border measuring approximatelt 1.76cm x 1.75cm x 2.06cm.   *See table(s) above for measurements and observations.   Diagnosing physician: Leotis Pain MD   Electronically signed by Leotis Pain MD on 07/02/2021 at 2:51:11 PM.       Final   VAS US CAROTID Carotid Arterial Duplex Study  Patient Name:  Tamara Finley  Date of Exam:   07/02/2021 Medical Rec #: [id number]       Accession #:    [id number] Date of Birth: April 19, 1941      Patient Gender: F Patient Age:   89 years Exam Location:  [facility] Procedure:      VAS US CAROTID Referring Phys: Leotis Pain  --------------------------------------------------------------------------------   Comparison Study:  12/28/2020  Performing Technologist: Charlane Ferretti RT (R)(VS)    Examination Guidelines: A complete evaluation includes B-mode imaging, spectral Doppler, color Doppler, and power Doppler as needed of all accessible portions of each vessel. Bilateral testing is considered an integral part of a complete examination. Limited examinations for reoccurring indications may be performed as noted.    Right Carotid  Findings: +----------+-------+--------+--------+---------------------+-------------------+             PSV     EDV cm/s Stenosis Plaque Description    Comments                         cm/s                                                                 +----------+-------+--------+--------+---------------------+-------------------+  CCA Prox   50      10                calcific and          intimal thickening  irregular                                  +----------+-------+--------+--------+---------------------+-------------------+  CCA Mid    93      21                calcific and          intimal thickening                                         irregular                                  +----------+-------+--------+--------+---------------------+-------------------+  CCA Distal 114     27                calcific              intimal thickening   +----------+-------+--------+--------+---------------------+-------------------+  ICA Prox   171     40                                      ICA/CCA ratio =                                                                  1.84                 +----------+-------+--------+--------+---------------------+-------------------+  ICA Mid    104     22                                                           +----------+-------+--------+--------+---------------------+-------------------+  ICA Distal 116     28                                                           +----------+-------+--------+--------+---------------------+-------------------+  ECA        222     16                calcific                                   +----------+-------+--------+--------+---------------------+-------------------+  +----------+--------+-------+----------------+-------------------+             PSV cm/s EDV cms Describe         Arm Pressure  (mmHG)  +----------+--------+-------+----------------+-------------------+  Subclavian 71               Multiphasic, WNL                      +----------+--------+-------+----------------+-------------------+  +---------+--------+--+--------+--+---------+  Vertebral PSV cm/s 89 EDV cm/s 15 Antegrade  +---------+--------+--+--------+--+---------+  Left Carotid Findings: +----------+--------+--------+--------+------------------+--------------------+             PSV cm/s EDV cm/s Stenosis Plaque Description Comments              +----------+--------+--------+--------+------------------+--------------------+  CCA Prox   56       15                calcific           intimal thickening    +----------+--------+--------+--------+------------------+--------------------+  CCA Mid    69       20                calcific           intimal thickening    +----------+--------+--------+--------+------------------+--------------------+  CCA Distal 111      25                                   Stent                 +----------+--------+--------+--------+------------------+--------------------+  ICA Prox   97       24                                   Sent                  +----------+--------+--------+--------+------------------+--------------------+  ICA Mid    88       27                                   ICA/CCA ratio = 1.41  +----------+--------+--------+--------+------------------+--------------------+  ICA Distal 97       22                                                         +----------+--------+--------+--------+------------------+--------------------+  ECA        341      23                                                         +----------+--------+--------+--------+------------------+--------------------+  +----------+--------+--------+-----------+-------------------+             PSV cm/s EDV cm/s Describe    Arm Pressure  (mmHG)  +----------+--------+--------+-----------+-------------------+  Subclavian 153               Multiphasic                      +----------+--------+--------+-----------+-------------------+  +---------+--------+--+--------+--+---------+  Vertebral PSV cm/s 89 EDV cm/s 17 Antegrade  +---------+--------+--+--------+--+---------+  Summary: Right Carotid: Velocities in the right ICA are consistent with a 40-59%                stenosis. The ECA appears >50% stenosed.  Left Carotid: Velocities in the left ICA are consistent with a 1-39% stenosis.  The ECA appears >50% stenosed.  Vertebrals:  Bilateral vertebral arteries demonstrate antegrade flow. Subclavians: Normal flow hemodynamics were seen in bilateral subclavian              arteries.  *See table(s) above for measurements and observations.    Electronically signed by Leotis Pain MD on 07/02/2021 at 2:46:07 PM.      Final    Assessment  There were no encounter diagnoses.  Plan of Care  Problem-specific:  No problem-specific Assessment & Plan notes found for this encounter.  Ms. LIDIA CLAVIJO has a current medication list which includes the following long-term medication(s): albuterol, azelastine, calcium 600+d plus minerals, carvedilol, furosemide, gabapentin, gabapentin, levothyroxine, levothyroxine, mirtazapine, montelukast, and pantoprazole.  Pharmacotherapy (Medications Ordered): No orders of the defined types were placed in this encounter.  Orders:  No orders of the defined types were placed in this encounter.  Follow-up plan:   No follow-ups on file.     consider RFA in future, Sprint PNS of medial branch at L4           Recent Visits Date Type Provider Dept  08/01/21 Office Visit Gillis Santa, MD [facility]  Showing recent visits within past 90 days and meeting all other requirements Today's Visits Date Type Provider Dept  10/29/21 Office Visit Gillis Santa, MD [facility]  Showing today's visits and meeting all other requirements Future Appointments No visits were found meeting these conditions. Showing future appointments within next 90 days and meeting all other requirements  I discussed the assessment and treatment plan with the patient. The patient was provided an opportunity to ask questions and all were answered. The patient agreed with the plan and demonstrated an understanding of the instructions.  Patient advised to call back or seek an in-person evaluation if the symptoms or condition worsens.  Duration of encounter: *** minutes.  Note by: Gillis Santa, MD Date: 10/29/2021; Time: 9:46 AM

## 2021-12-05 ENCOUNTER — Telehealth: Payer: Self-pay

## 2021-12-09 NOTE — Telephone Encounter (Signed)
No message

## 2021-12-17 ENCOUNTER — Telehealth (INDEPENDENT_AMBULATORY_CARE_PROVIDER_SITE_OTHER): Payer: Self-pay | Admitting: Vascular Surgery

## 2021-12-17 NOTE — Telephone Encounter (Signed)
Pt's daughter, Cato Mulligan, [facility] stating she needed to talk to someone about pt's appt on Jan 03, 2022. I ATC Ms. Owens and LVM for her TCB and we would be happy to help her.  ?

## 2021-12-31 DIAGNOSIS — I701 Atherosclerosis of renal artery: Secondary | ICD-10-CM | POA: Diagnosis not present

## 2021-12-31 DIAGNOSIS — D631 Anemia in chronic kidney disease: Secondary | ICD-10-CM | POA: Diagnosis not present

## 2021-12-31 DIAGNOSIS — R600 Localized edema: Secondary | ICD-10-CM | POA: Diagnosis not present

## 2021-12-31 DIAGNOSIS — N1832 Chronic kidney disease, stage 3b: Secondary | ICD-10-CM | POA: Diagnosis not present

## 2021-12-31 DIAGNOSIS — N1831 Chronic kidney disease, stage 3a: Secondary | ICD-10-CM | POA: Diagnosis not present

## 2021-12-31 DIAGNOSIS — R809 Proteinuria, unspecified: Secondary | ICD-10-CM | POA: Diagnosis not present

## 2022-01-03 ENCOUNTER — Encounter (INDEPENDENT_AMBULATORY_CARE_PROVIDER_SITE_OTHER): Payer: Medicare HMO

## 2022-01-03 ENCOUNTER — Ambulatory Visit (INDEPENDENT_AMBULATORY_CARE_PROVIDER_SITE_OTHER): Payer: Medicare HMO | Admitting: Vascular Surgery

## 2022-02-04 ENCOUNTER — Ambulatory Visit
Payer: Medicare HMO | Attending: Student in an Organized Health Care Education/Training Program | Admitting: Student in an Organized Health Care Education/Training Program

## 2022-02-04 ENCOUNTER — Encounter: Payer: Self-pay | Admitting: Student in an Organized Health Care Education/Training Program

## 2022-02-04 VITALS — BP 124/61 | HR 72 | Temp 97.5°F | Ht 64.0 in | Wt 186.0 lb

## 2022-02-04 DIAGNOSIS — M542 Cervicalgia: Secondary | ICD-10-CM | POA: Diagnosis not present

## 2022-02-04 DIAGNOSIS — M5030 Other cervical disc degeneration, unspecified cervical region: Secondary | ICD-10-CM | POA: Insufficient documentation

## 2022-02-04 DIAGNOSIS — G8929 Other chronic pain: Secondary | ICD-10-CM | POA: Insufficient documentation

## 2022-02-04 DIAGNOSIS — M47816 Spondylosis without myelopathy or radiculopathy, lumbar region: Secondary | ICD-10-CM | POA: Insufficient documentation

## 2022-02-04 DIAGNOSIS — G894 Chronic pain syndrome: Secondary | ICD-10-CM | POA: Diagnosis not present

## 2022-02-04 DIAGNOSIS — M533 Sacrococcygeal disorders, not elsewhere classified: Secondary | ICD-10-CM | POA: Insufficient documentation

## 2022-02-04 MED ORDER — HYDROCODONE-ACETAMINOPHEN 5-325 MG PO TABS: 1.0000 | ORAL_TABLET | Freq: Two times a day (BID) | ORAL | 0 refills | Status: DC | PRN

## 2022-02-04 MED ORDER — GABAPENTIN 300 MG PO CAPS: 600.0000 mg | ORAL_CAPSULE | Freq: Every day | ORAL | 2 refills | Status: DC

## 2022-02-04 MED ORDER — HYDROCODONE-ACETAMINOPHEN 5-325 MG PO TABS: 1.0000 | ORAL_TABLET | Freq: Two times a day (BID) | ORAL | 0 refills | Status: AC | PRN

## 2022-02-04 NOTE — Progress Notes (Signed)
PROVIDER NOTE: Information contained herein reflects review and annotations entered in association with encounter. Interpretation of such information and data should be left to medically-trained personnel. Information provided to patient can be located elsewhere in the medical record under "Patient Instructions". Document created using STT-dictation technology, any transcriptional errors that may result from process are unintentional.    Patient: Tamara Finley  Service Category: E/M  Provider: Gillis Santa, MD  DOB: [birth year]  DOS: 02/04/2022  Specialty: Interventional Pain Management  MRN: [id number]  Setting: Ambulatory outpatient  PCP: Adin Hector, MD  Type: Established Patient    Referring Provider: Adin Hector, MD  Location: Office  Delivery: Face-to-face     HPI  [name], a 81 y.o. year old female, is here today because of her Chronic pain syndrome [G89.4]. Tamara Finley primary complain today is Back Pain (lower)  Last encounter: My last encounter with her was on 10/29/21 Pertinent problems: Tamara Finley has Low back pain radiating to both legs; Peripheral arterial disease ([place]); Right hip pain; Chronic SI joint pain; Lumbar facet arthropathy (L4/L5 and L5/S1); Controlled substance agreement signed; Lumbar spondylosis; and Lumbar radiculopathy on their pertinent problem list. Pain Assessment: Severity of Chronic pain is reported as a 5 /10. Location: Back Right, Left (right is the worse)/pain radiaties down both hip. Onset: More than a month ago. Quality: Aching, Constant. Timing: Constant. Modifying factor(s): meds, laying down. Vitals:  height is 5' 4"  (1.626 m) and weight is 186 lb (84.4 kg). Her temperature is 97.5 F (36.4 C) (abnormal). Her blood pressure is 124/61 and her pulse is 72. Her oxygen saturation is 95%.   Reason for encounter: medication management.   No change in medical history since last visit.  Patient's pain is at baseline.  Patient continues  multimodal pain regimen as prescribed.  States that it provides pain relief and improvement in functional status. Unfortunately, Tamara Finley lost her husband.  At her last clinic visit, she informed me that he had been dealing with his lymphoma recurrence.  He passed somewhat expectedly related to brain metastases.  She is very sad and currently in the stages of bereavement. We will refill her medications as below.  She states that she has a good support system during this time.  Pharmacotherapy Assessment  Analgesic: Hydrocodone 5 mg two times daily prn, quantity 60/month    Monitoring: [place] PMP: PDMP reviewed during this encounter.       Pharmacotherapy: No side-effects or adverse reactions reported. Compliance: No problems identified. Effectiveness: Clinically acceptable.  Chauncey Fischer, RN  02/04/2022 10:47 AM  Sign when Signing Visit Nursing Pain Medication Assessment:  Safety precautions to be maintained throughout the outpatient stay will include: orient to surroundings, keep bed in low position, maintain call bell within reach at all times, provide assistance with transfer out of bed and ambulation.  Medication Inspection Compliance: Pill count conducted under aseptic conditions, in front of the patient. Neither the pills nor the bottle was removed from the patient's sight at any time. Once count was completed pills were immediately returned to the patient in their original bottle.  Medication: Hydrocodone/APAP Pill/Patch Count:  42 of 60 pills remain Pill/Patch Appearance: Markings consistent with prescribed medication Bottle Appearance: Standard pharmacy container. Clearly labeled. Filled Date: 5 / 24 / 2023 Last Medication intake:  YesterdaySafety precautions to be maintained throughout the outpatient stay will include: orient to surroundings, keep bed in low position, maintain call bell within reach at all times,  provide assistance with transfer out of bed and ambulation.    UDS:   Summary  Date Value Ref Range Status  04/30/2021 Note  Final    Comment:    ==================================================================== [employer] Select 13 (MW) ==================================================================== Test                             Result       Flag       Units  Drug Present and Declared for Prescription Verification   Norhydrocodone                 223          EXPECTED   ng/mg creat    Norhydrocodone is an expected metabolite of hydrocodone.  Drug Absent but Declared for Prescription Verification   Hydrocodone                    Not Detected UNEXPECTED ng/mg creat    Hydrocodone is almost always present in patients taking this drug    consistently. Absence of hydrocodone could be due to lapse of time    since the last dose or unusual pharmacokinetics (rapid metabolism).  ==================================================================== Test                      Result    Flag   Units      Ref Range   Creatinine              26               mg/dL      >=20 ==================================================================== Declared Medications:  The flagging and interpretation on this report are based on the  following declared medications.  Unexpected results may arise from  inaccuracies in the declared medications.   **Note: The testing scope of this panel includes these medications:   Hydrocodone (Norco)   **Note: The testing scope of this panel does not include the  following reported medications:   Acetaminophen (Tylenol)  Acetaminophen (Norco)  Albuterol (Ventolin HFA)  Anastrozole (Arimidex)  Aspirin  Atorvastatin (Lipitor)  Azelastine (Astelin)  Calcium  Carvedilol (Coreg)  Cyanocobalamin  Fluticasone (Flonase)  Furosemide (Lasix)  Gabapentin (Neurontin)  Hydralazine (Apresoline)  Levothyroxine (Synthroid)  Mirtazapine (Remeron)  Montelukast (Singulair)  Pantoprazole (Protonix)  Potassium (Klor-Con)  Vitamin  D ==================================================================== For clinical consultation, please call [phone]. ====================================================================      ROS  Constitutional: Denies any fever or chills Gastrointestinal: No reported hemesis, hematochezia, vomiting, or acute GI distress Musculoskeletal:  low back pain Neurological: No reported episodes of acute onset apraxia, aphasia, dysarthria, agnosia, amnesia, paralysis, loss of coordination, or loss of consciousness  Medication Review  Calcium 600+D Plus Minerals, DSS, HYDROcodone-acetaminophen, acetaminophen, albuterol, anastrozole, aspirin, atorvastatin, azelastine, carvedilol, fluticasone, furosemide, gabapentin, hydrALAZINE, levothyroxine, mirtazapine, montelukast, pantoprazole, potassium chloride SA, and vitamin B-12  History Review  Allergy: Ms. Mobley is allergic to benadryl [diphenhydramine hcl (sleep)] and alprazolam. Drug: Ms. Dominey  reports no history of drug use. Alcohol:  reports no history of alcohol use. Tobacco:  reports that she quit smoking about 8 years ago. Her smoking use included cigarettes. She has a 100.00 pack-year smoking history. She has never used smokeless tobacco. Social: Ms. Vandenberg  reports that she quit smoking about 8 years ago. Her smoking use included cigarettes. She has a 100.00 pack-year smoking history. She has never used smokeless tobacco. She reports that she does not drink  alcohol and does not use drugs. Medical:  has a past medical history of Abdominal aortic aneurysm (AAA) ([place]) (2000's), Age related entropion, unspecified laterality, Anxiety, Anxiety and depression, Bilateral renal artery stenosis ([place]), Breast calcification, left, Cancer ([place]) (10/20/2016), Carotid artery thrombosis, bilateral, Carotid stenosis, Chronic bronchitis (H[facility]), Chronic insomnia, COPD (chronic obstructive pulmonary disease) ([place]), Coronary artery disease, CRI (chronic renal  insufficiency), stage 3 (moderate) ([place]), Depression, Diabetes mellitus without complication ([place]), GERD (gastroesophageal reflux disease), Heart palpitations, History of AAA (abdominal aortic aneurysm) repair, History of colon polyps, History of TIA (transient ischemic attack), Hypercholesterolemia, Hypertension, Hypothyroidism, Migraine headache, Osteoarthritis, Osteoporosis, Peripheral arterial disease ([place]), Pulmonary scarring, Renal disorder, SOB (shortness of breath), 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 Surgical: Ms. Gueye  has a past surgical history that includes Cardiac surgery; Abdominal aortic aneurysm repair; Cardiac catheterization (N/A, 12/05/2015); Cardiac catheterization (Left, 12/31/2015); Breast excisional biopsy (Left, 2010); Breast excisional biopsy (Left, 07/01/2006); Breast biopsy (Left, 09/03/2016); Breast biopsy (Left, 09/25/2016); Bronchoscopy; CAROTID PTA/STENT INTERVENTION; Colonoscopy; Eye surgery; Laminectomy (07/25/2013); Back surgery; Mediastinoscopy; Mastectomy; Thoracoscopy (12/16/2016); and Colonoscopy with propofol (N/A, 06/15/2017). Family: family history includes CVA in her mother; Cancer in her brother; Coronary artery disease in her father; Dementia in her father; Diabetes in her brother; Epilepsy in her mother; Hypertension in her father and mother; Ovarian cancer in her sister; Stroke in her father and mother.  Laboratory Chemistry Profile   Renal Lab Results  Component Value Date   BUN 9 01/01/2016   CREATININE 1.03 (H) 01/01/2016   GFRAA >60 01/01/2016   GFRNONAA 53 (L) 01/01/2016    Hepatic Lab Results  Component Value Date   AST 28 10/17/2015   ALT 17 10/17/2015   ALBUMIN 3.7 10/17/2015   ALKPHOS 64 10/17/2015    Electrolytes Lab Results  Component Value Date   NA 139 01/01/2016   K 3.5 01/01/2016   CL 106 01/01/2016    CALCIUM 8.2 (L) 01/01/2016    Bone No results found for: VD25OH, TI458KD9IPJ, AS5053ZJ6, BH4193XT0, 25OHVITD1, 25OHVITD2, 25OHVITD3, TESTOFREE, TESTOSTERONE  Inflammation (CRP: Acute Phase) (ESR: Chronic Phase) Lab Results  Component Value Date   ESRSEDRATE 23 10/12/2015         Note: Above Lab results reviewed.  Recent Imaging Review  VAS US RENAL ARTERY DUPLEX ABDOMINAL VISCERAL  Patient Name:  RONAN DION  Date of Exam:   07/02/2021 Medical Rec #: [id number]       Accession #:    [id number] Date of Birth: [birth year]      Patient Gender: F Patient Age:   2 years Exam Location:  [facility] Procedure:      VAS US RENAL ARTERY DUPLEX Referring Phys: [id number] JASON S DEW  --------------------------------------------------------------------------------  Limitations: Air/bowel gas and obesity. Comparison Study: 12/28/2020  Performing Technologist: Charlane Ferretti RT (R)(VS)    Examination Guidelines: A complete evaluation includes B-mode imaging, spectral Doppler, color Doppler, and power Doppler as needed of all accessible portions of each vessel. Bilateral testing is considered an integral part of a complete examination. Limited examinations for reoccurring indications may be performed as noted.    Duplex Findings: +----------+--------+--------+------+--------+ MesentericPSV cm/sEDV cm/sPlaqueComments +----------+--------+--------+------+--------+ Aorta Mid    65                          +----------+--------+--------+------+--------+    +------------------+--------+--------+-------+ Right Renal ArteryPSV cm/sEDV cm/sComment +------------------+--------+--------+-------+ Proximal  37                   +------------------+--------+--------+-------+ Mid                  36                   +------------------+--------+--------+-------+ Distal               25                    +------------------+--------+--------+-------+  +-----------------+--------+--------+-------+ Left Renal ArteryPSV cm/sEDV cm/sComment +-----------------+--------+--------+-------+ Proximal           168                   +-----------------+--------+--------+-------+ Mid                 72                   +-----------------+--------+--------+-------+ Distal             103                   +-----------------+--------+--------+-------+    +------------------+----+------------------+-----+ Right Kidney          Left Kidney             +------------------+----+------------------+-----+ RAR (manual)          RAR (manual)            +------------------+----+------------------+-----+ Kidney length (cm)5.80Kidney length (cm)11.50 +------------------+----+------------------+-----+  Summary: Renal:   Right: Abnormal size for the right kidney. No evidence of right        renal artery stenosis. RRV flow present. Small right kidney        as noted in previous exam on 12/28/2020. Left:  Normal size of left kidney. No evidence of left renal artery        stenosis. LRV flow present. Mid left renal cyst near lateral        border measuring approximatelt 1.76cm x 1.75cm x 2.06cm.   *See table(s) above for measurements and observations.   Diagnosing physician: Leotis Pain MD   Electronically signed by Leotis Pain MD on 07/02/2021 at 2:51:11 PM.       Final   VAS US CAROTID Carotid Arterial Duplex Study  Patient Name:  Tamara Finley  Date of Exam:   07/02/2021 Medical Rec #: [id number]       Accession #:    [id number] Date of Birth: Apr 23, 1941      Patient Gender: F Patient Age:   59 years Exam Location:  [facility] Procedure:      VAS US CAROTID Referring Phys: Leotis Pain  --------------------------------------------------------------------------------   Comparison Study:  12/28/2020  Performing Technologist: Charlane Ferretti RT  (R)(VS)    Examination Guidelines: A complete evaluation includes B-mode imaging, spectral Doppler, color Doppler, and power Doppler as needed of all accessible portions of each vessel. Bilateral testing is considered an integral part of a complete examination. Limited examinations for reoccurring indications may be performed as noted.    Right Carotid Findings: +----------+-------+--------+--------+---------------------+-------------------+           PSV    EDV cm/sStenosisPlaque Description   Comments                      cm/s                                                            +----------+-------+--------+--------+---------------------+-------------------+  CCA Prox  50     10              calcific and         intimal thickening                                   irregular                                +----------+-------+--------+--------+---------------------+-------------------+ CCA Mid   93     21              calcific and         intimal thickening                                   irregular                                +----------+-------+--------+--------+---------------------+-------------------+ CCA Distal114    27              calcific             intimal thickening  +----------+-------+--------+--------+---------------------+-------------------+ ICA Prox  171    40                                   ICA/CCA ratio =                                                           1.84                +----------+-------+--------+--------+---------------------+-------------------+ ICA Mid   104    22                                                       +----------+-------+--------+--------+---------------------+-------------------+ ICA Distal116    28                                                       +----------+-------+--------+--------+---------------------+-------------------+ ECA       222     16              calcific                                 +----------+-------+--------+--------+---------------------+-------------------+  +----------+--------+-------+----------------+-------------------+           PSV cm/sEDV cmsDescribe        Arm Pressure (mmHG) +----------+--------+-------+----------------+-------------------+ [id number]             Multiphasic, WNL                    +----------+--------+-------+----------------+-------------------+  +---------+--------+--+--------+--+---------+  VertebralPSV cm/s89EDV cm/s15Antegrade +---------+--------+--+--------+--+---------+  Left Carotid Findings: +----------+--------+--------+--------+------------------+--------------------+           PSV cm/sEDV cm/sStenosisPlaque DescriptionComments             +----------+--------+--------+--------+------------------+--------------------+ CCA Prox  56      15              calcific          intimal thickening   +----------+--------+--------+--------+------------------+--------------------+ CCA Mid   69      20              calcific          intimal thickening   +----------+--------+--------+--------+------------------+--------------------+ CCA Distal111     25                                Stent                +----------+--------+--------+--------+------------------+--------------------+ ICA Prox  97      24                                Sent                 +----------+--------+--------+--------+------------------+--------------------+ ICA Mid   88      27                                ICA/CCA ratio = 1.41 +----------+--------+--------+--------+------------------+--------------------+ ICA Distal97      22                                                     +----------+--------+--------+--------+------------------+--------------------+ ECA       341     23                                                      +----------+--------+--------+--------+------------------+--------------------+  +----------+--------+--------+-----------+-------------------+           PSV cm/sEDV cm/sDescribe   Arm Pressure (mmHG) +----------+--------+--------+-----------+-------------------+ [id number]             Multiphasic                    +----------+--------+--------+-----------+-------------------+  +---------+--------+--+--------+--+---------+ VertebralPSV cm/s89EDV cm/s17Antegrade +---------+--------+--+--------+--+---------+  Summary: Right Carotid: Velocities in the right ICA are consistent with a 40-59%                stenosis. The ECA appears >50% stenosed.  Left Carotid: Velocities in the left ICA are consistent with a 1-39% stenosis.               The ECA appears >50% stenosed.  Vertebrals:  Bilateral vertebral arteries demonstrate antegrade flow. Subclavians: Normal flow hemodynamics were seen in bilateral subclavian              arteries.  *See table(s) above for measurements and observations.    Electronically signed by Leotis Pain MD on 07/02/2021 at 2:46:07 PM.      Final    Note: Reviewed  Physical Exam  General appearance: Well nourished, well developed, and well hydrated. In no apparent acute distress Mental status: Alert, oriented x 3 (person, place, & time)       Respiratory: No evidence of acute respiratory distress Eyes: PERLA Vitals: BP 124/61   Pulse 72   Temp (!) 97.5 F (36.4 C)   Ht 5' 4"  (1.626 m)   Wt 186 lb (84.4 kg)   SpO2 95%   BMI 31.93 kg/m  BMI: Estimated body mass index is 31.93 kg/m as calculated from the following:   Height as of this encounter: 5' 4"  (1.626 m).   Weight as of this encounter: 186 lb (84.4 kg). Ideal: Ideal body weight: 54.7 kg (120 lb 9.5 oz) Adjusted ideal body weight: 66.6 kg (146 lb 12.1 oz)  Increased low back pain, SI joint pain  Assessment   Status Diagnosis   Controlled Controlled Controlled 1. Chronic pain syndrome   2. Chronic SI joint pain   3. Cervicalgia   4. Bilateral neck pain (right)   5. Lumbar spondylosis   6. Lumbar facet arthropathy (L4/L5 and L5/S1)   7. DDD (degenerative disc disease), cervical      Plan of Care    Ms. OZELLA COMINS has a current medication list which includes the following long-term medication(s): albuterol, azelastine, calcium 600+d plus minerals, carvedilol, furosemide, levothyroxine, mirtazapine, montelukast, pantoprazole, gabapentin, and levothyroxine.  Pharmacotherapy (Medications Ordered): Meds ordered this encounter  Medications   HYDROcodone-acetaminophen (NORCO/VICODIN) 5-325 MG tablet    Sig: Take 1 tablet by mouth 2 (two) times daily as needed for moderate pain.    Dispense:  60 tablet    Refill:  0   HYDROcodone-acetaminophen (NORCO/VICODIN) 5-325 MG tablet    Sig: Take 1 tablet by mouth 2 (two) times daily as needed for moderate pain.    Dispense:  60 tablet    Refill:  0   HYDROcodone-acetaminophen (NORCO/VICODIN) 5-325 MG tablet    Sig: Take 1 tablet by mouth 2 (two) times daily as needed for moderate pain.    Dispense:  60 tablet    Refill:  0   gabapentin (NEURONTIN) 300 MG capsule    Sig: Take 2 capsules (600 mg total) by mouth at bedtime.    Dispense:  90 capsule    Refill:  2    Follow-up plan:   Return in about 3 months (around 05/07/2022) for Medication Management, in person.     consider RFA in future, Sprint PNS of medial branch at L4          Recent Visits No visits were found meeting these conditions. Showing recent visits within past 90 days and meeting all other requirements Today's Visits Date Type Provider Dept  02/04/22 Office Visit Gillis Santa, MD [facility]  Showing today's visits and meeting all other requirements Future Appointments Date Type Provider Dept  05/01/22 Appointment Gillis Santa, MD [facility]  Showing future  appointments within next 90 days and meeting all other requirements  I discussed the assessment and treatment plan with the patient. The patient was provided an opportunity to ask questions and all were answered. The patient agreed with the plan and demonstrated an understanding of the instructions.  Patient advised to call back or seek an in-person evaluation if the symptoms or condition worsens.  Duration of encounter: 54mnutes.  Note by: BGillis Santa MD Date: 02/04/2022; Time: 11:02 AM

## 2022-02-04 NOTE — Progress Notes (Signed)
Nursing Pain Medication Assessment:  Safety precautions to be maintained throughout the outpatient stay will include: orient to surroundings, keep bed in low position, maintain call bell within reach at all times, provide assistance with transfer out of bed and ambulation.  Medication Inspection Compliance: Pill count conducted under aseptic conditions, in front of the patient. Neither the pills nor the bottle was removed from the patient's sight at any time. Once count was completed pills were immediately returned to the patient in their original bottle.  Medication: Hydrocodone/APAP Pill/Patch Count:  42 of 60 pills remain Pill/Patch Appearance: Markings consistent with prescribed medication Bottle Appearance: Standard pharmacy container. Clearly labeled. Filled Date: 5 / 24 / 2023 Last Medication intake:  YesterdaySafety precautions to be maintained throughout the outpatient stay will include: orient to surroundings, keep bed in low position, maintain call bell within reach at all times, provide assistance with transfer out of bed and ambulation.

## 2022-02-07 ENCOUNTER — Ambulatory Visit (INDEPENDENT_AMBULATORY_CARE_PROVIDER_SITE_OTHER): Payer: Medicare HMO

## 2022-02-07 ENCOUNTER — Encounter (INDEPENDENT_AMBULATORY_CARE_PROVIDER_SITE_OTHER): Payer: Self-pay | Admitting: Vascular Surgery

## 2022-02-07 ENCOUNTER — Ambulatory Visit (INDEPENDENT_AMBULATORY_CARE_PROVIDER_SITE_OTHER): Payer: Medicare HMO | Admitting: Vascular Surgery

## 2022-02-07 VITALS — BP 162/68 | HR 78 | Resp 16 | Wt 176.2 lb

## 2022-02-07 DIAGNOSIS — E785 Hyperlipidemia, unspecified: Secondary | ICD-10-CM

## 2022-02-07 DIAGNOSIS — I6523 Occlusion and stenosis of bilateral carotid arteries: Secondary | ICD-10-CM

## 2022-02-07 DIAGNOSIS — I10 Essential (primary) hypertension: Secondary | ICD-10-CM

## 2022-02-07 DIAGNOSIS — I701 Atherosclerosis of renal artery: Secondary | ICD-10-CM | POA: Diagnosis not present

## 2022-02-07 DIAGNOSIS — E1159 Type 2 diabetes mellitus with other circulatory complications: Secondary | ICD-10-CM

## 2022-02-07 NOTE — Progress Notes (Signed)
MRN : [id number]  Tamara Finley is a 81 y.o. (1941-01-16) female who presents with chief complaint of  Chief Complaint  Patient presents with   Follow-up    Ultrasound follow up  .  History of Present Illness: Patient returns today in follow up of multiple vascular issues.  She is doing well today and does not have any specific complaints.  She denies any focal neurologic symptoms. Specifically, the patient denies amaurosis fugax, speech or swallowing difficulties, or arm or leg weakness or numbness.  Duplex today shows velocities in the upper end of the 40 to 59% range on the right and in the lower end of the 40 to 59% range on the left.  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  No claudication, rest pain, or limb threatening symptoms of the lower extremities. 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  The most recent was over 5 years ago.  She says her blood pressure and kidney function are stable as far she knows. Duplex today shows the left renal artery velocities to be normal in the left kidney size to be normal with a small right kidney.  Current Outpatient Medications  Medication Sig Dispense Refill   acetaminophen (TYLENOL) 500 MG tablet Take 1,000 mg by mouth every 8 (eight) hours as needed for moderate pain.      albuterol (PROVENTIL HFA;VENTOLIN HFA) 108 (90 BASE) MCG/ACT inhaler Inhale 2 puffs into the lungs every 6 (six) hours as needed for wheezing or shortness of breath. Reported on 12/31/2015     anastrozole (ARIMIDEX) 1 MG tablet Take 1 mg by mouth daily.      aspirin 81 MG chewable tablet Chew 81 mg by mouth daily.     atorvastatin (LIPITOR) 40 MG tablet Take 40 mg by mouth daily at 6 PM.      azelastine (ASTELIN) 0.1 % nasal spray Place into both nostrils 2 (two) times daily. Use in  each nostril as directed     Calcium Carbonate-Vit D-Min (CALCIUM 600+D PLUS MINERALS) 600-400 MG-UNIT TABS Take by mouth.     carvedilol (COREG) 6.25 MG tablet Take 6.25 mg by mouth 2 (two) times daily with a meal.     Docusate Sodium (DSS) 100 MG CAPS Take by mouth.     fluticasone (FLONASE) 50 MCG/ACT nasal spray Place 1-2 sprays into both nostrils daily as needed for rhinitis.     furosemide (LASIX) 40 MG tablet Take 1 tablet by mouth 1 day or 1 dose.     gabapentin (NEURONTIN) 300 MG capsule Take 2 capsules (600 mg total) by mouth at bedtime. 90 capsule 2   hydrALAZINE (APRESOLINE) 50 MG tablet Take 50 mg by mouth 3 (three) times daily.     [START ON 02/22/2022] HYDROcodone-acetaminophen (NORCO/VICODIN) 5-325 MG tablet Take 1 tablet by mouth 2 (two) times daily as needed for moderate pain. 60 tablet 0   [START ON 03/24/2022] HYDROcodone-acetaminophen (NORCO/VICODIN) 5-325 MG tablet Take 1 tablet by mouth 2 (two) times daily as needed for moderate pain. 60 tablet 0   [START ON 04/23/2022] HYDROcodone-acetaminophen (NORCO/VICODIN) 5-325 MG tablet Take 1 tablet by mouth 2 (two) times daily as needed for moderate pain. 60 tablet 0   levothyroxine (SYNTHROID, LEVOTHROID) 88 MCG tablet Take 88 mcg by mouth daily before breakfast.  mirtazapine (REMERON) 15 MG tablet TAKE 1 TABLET BY MOUTH EVERY DAY AT NIGHT  3   montelukast (SINGULAIR) 10 MG tablet Take 10 mg by mouth at bedtime.     pantoprazole (PROTONIX) 40 MG tablet Take 40 mg by mouth daily.     potassium chloride SA (K-DUR,KLOR-CON) 20 MEQ tablet Take 20 mEq by mouth as directed.     vitamin B-12 (CYANOCOBALAMIN) 1000 MCG tablet Take 1 tablet by mouth 1 day or 1 dose.     levothyroxine (SYNTHROID) 88 MCG tablet Take by mouth. (Patient not taking: Reported on 02/04/2022)     No current facility-administered medications for this visit.    Past Medical History:  Diagnosis Date   Abdominal aortic aneurysm (AAA) ([place]) 2000's   Age related  entropion, unspecified laterality    Anxiety    Anxiety and depression    Bilateral renal artery stenosis (HCC)    Breast calcification, left    Cancer ([place]) 10/20/2016   breast cancer   Carotid artery thrombosis, bilateral    Carotid stenosis    Chronic bronchitis (HCC)    Chronic insomnia    COPD (chronic obstructive pulmonary disease) (HCC)    Coronary artery disease    CRI (chronic renal insufficiency), stage 3 (moderate) (HCC)    Depression    Diabetes mellitus without complication (HCC)    GERD (gastroesophageal reflux disease)    Heart palpitations    History of AAA (abdominal aortic aneurysm) repair    History of colon polyps    History of TIA (transient ischemic attack)    Hypercholesterolemia    Hypertension    Hypothyroidism    Migraine headache    Osteoarthritis    Osteoporosis    Peripheral arterial disease (HCC)    Pulmonary scarring    Renal disorder    SOB (shortness of breath)    Stenosis of left subclavian artery (HCC)    Stroke (HCC)    Thyroid disease    TIA (transient ischemic attack)    Type 2 diabetes mellitus with vascular disease ([place])     Past Surgical History:  Procedure Laterality Date   ABDOMINAL AORTIC ANEURYSM REPAIR     BACK SURGERY     BREAST BIOPSY Left 09/03/2016   benign   BREAST BIOPSY Left 09/25/2016   path pending   BREAST EXCISIONAL BIOPSY Left 2010   BREAST EXCISIONAL BIOPSY Left 07/01/2006   BRONCHOSCOPY     CARDIAC SURGERY     CAROTID PTA/STENT INTERVENTION     COLONOSCOPY     COLONOSCOPY WITH PROPOFOL N/A 06/15/2017   Procedure: COLONOSCOPY WITH PROPOFOL;  Surgeon: Lollie Sails, MD;  Location: [facility] ENDOSCOPY;  Service: Endoscopy;  Laterality: N/A;   EYE SURGERY     LAMINECTOMY  07/25/2013   MASTECTOMY     MEDIASTINOSCOPY     PERIPHERAL VASCULAR CATHETERIZATION N/A 12/05/2015   Procedure: Renal Angiography;  Surgeon: Katha Cabal, MD;  Location: [facility];  Service: Cardiovascular;  Laterality:  N/A;   PERIPHERAL VASCULAR CATHETERIZATION Left 12/31/2015   Procedure: Carotid PTA/Stent Intervention;  Surgeon: Algernon Huxley, MD;  Location: [facility];  Service: Cardiovascular;  Laterality: Left;   THORACOSCOPY  12/16/2016     Social History   Tobacco Use   Smoking status: Former    Packs/day: 2.00    Years: 50.00    Total pack years: 100.00    Types: Cigarettes    Quit date: 06/01/2013    Years  since quitting: 8.6   Smokeless tobacco: Never  Vaping Use   Vaping Use: Never used  Substance Use Topics   Alcohol use: No    Alcohol/week: 0.0 standard drinks of alcohol   Drug use: No      Family History  Problem Relation Age of Onset   CVA Mother    Hypertension Mother    Stroke Mother    Epilepsy Mother    Dementia Father    Stroke Father    Coronary artery disease Father    Hypertension Father    Ovarian cancer Sister    Cancer Brother    Diabetes Brother      Allergies  Allergen Reactions   Benadryl [Diphenhydramine Hcl (Sleep)] Other (See Comments)    BAD DREAMS   Alprazolam Rash    "bad dreams"    REVIEW OF SYSTEMS (Negative unless checked)   Constitutional: '[]'$ Weight loss  '[]'$ Fever  '[]'$ Chills Cardiac: '[]'$ Chest pain   '[]'$ Chest pressure   '[]'$ Palpitations   '[]'$ Shortness of breath when laying flat   '[]'$ Shortness of breath at rest   '[x]'$ Shortness of breath with exertion. Vascular:  '[]'$ Pain in legs with walking   '[]'$ Pain in legs at rest   '[]'$ Pain in legs when laying flat   '[]'$ Claudication   '[]'$ Pain in feet when walking  '[]'$ Pain in feet at rest  '[]'$ Pain in feet when laying flat   '[]'$ History of DVT   '[]'$ Phlebitis   '[x]'$ Swelling in legs   '[]'$ Varicose veins   '[]'$ Non-healing ulcers Pulmonary:   '[]'$ Uses home oxygen   '[]'$ Productive cough   '[]'$ Hemoptysis   '[]'$ Wheeze  '[x]'$ COPD   '[]'$ Asthma Neurologic:  '[]'$ Dizziness  '[]'$ Blackouts   '[]'$ Seizures   '[]'$ History of stroke   '[x]'$ History of TIA  '[]'$ Aphasia   '[]'$ Temporary blindness   '[]'$ Dysphagia   '[]'$ Weakness or numbness in arms   '[]'$ Weakness or numbness in  legs Musculoskeletal:  '[x]'$ Arthritis   '[]'$ Joint swelling   '[]'$ Joint pain   '[]'$ Low back pain Hematologic:  '[]'$ Easy bruising  '[]'$ Easy bleeding   '[]'$ Hypercoagulable state   '[]'$ Anemic   Gastrointestinal:  '[]'$ Blood in stool   '[]'$ Vomiting blood  '[]'$ Gastroesophageal reflux/heartburn   '[]'$ Abdominal pain Genitourinary:  '[x]'$ Chronic kidney disease   '[]'$ Difficult urination  '[]'$ Frequent urination  '[]'$ Burning with urination   '[]'$ Hematuria Skin:  '[]'$ Rashes   '[]'$ Ulcers   '[]'$ Wounds Psychological:  '[]'$ History of anxiety   '[]'$  History of major depression.   Physical Examination  BP (!) 162/68 (BP Location: Right Arm)   Pulse 78   Resp 16   Wt 176 lb 3.2 oz (79.9 kg)   BMI 30.24 kg/m  Gen:  WD/WN, NAD.  Appears younger than stated age Head: [place]/AT, No temporalis wasting. Ear/Nose/Throat: Hearing grossly intact, nares w/o erythema or drainage Eyes: Conjunctiva clear. Sclera non-icteric Neck: Supple.  Trachea midline.  Soft right carotid bruit is present Pulmonary:  Good air movement, no use of accessory muscles.  Cardiac: RRR, no JVD Vascular:  Vessel Right Left  Radial Palpable Palpable           Musculoskeletal: M/S 5/5 throughout.  No deformity or atrophy.  No significant lower extremity edema. Neurologic: Sensation grossly intact in extremities.  Symmetrical.  Speech is fluent.  Psychiatric: Judgment intact, Mood & affect appropriate for pt's clinical situation. Dermatologic: No rashes or ulcers noted.  No cellulitis or open wounds.      Labs No results found for this or any previous visit (from the past 2160 hour(s)).  Radiology No results found.  Assessment/Plan Type 2 diabetes  mellitus with vascular disease ([place]) blood glucose control important in reducing the progression of atherosclerotic disease. Also, involved in wound healing. On appropriate medications.     Hyperlipidemia, unspecified lipid control important in reducing the progression of atherosclerotic disease. Continue statin therapy    History of AAA (abdominal aortic aneurysm) repair Aortobiiliac graft appears patent by duplex at last check   Benign essential hypertension blood pressure control important in reducing the progression of atherosclerotic disease. On appropriate oral medications.  Renal artery stenosis (HCC) Status post left renal artery stent and subsequent additional stent placement for recurrent stenosis over 5 years ago.  Duplex today shows the left renal artery velocities to be normal in the left kidney size to be normal with a small right kidney.  Her aortobiiliac bypass appears patent.  No role for intervention.  Since we are over 5 years out from her last intervention, I think we can go to checking this on an annual basis.  Continue current medical regimen.  Carotid stenosis Duplex today shows velocities in the upper end of the 40 to 59% range on the right and in the lower end of the 40 to 59% range on the left.  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  Overall doing well.  Continue current medical regimen.  Recheck in 1 year.    Leotis Pain, MD  02/07/2022 10:31 AM    This note was created with Dragon medical transcription system.  Any errors from dictation are purely unintentional

## 2022-02-07 NOTE — Assessment & Plan Note (Signed)
Duplex today shows velocities in the upper end of the 40 to 59% range on the right and in the lower end of the 40 to 59% range on the left.  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  Overall doing well.  Continue current medical regimen.  Recheck in 1 year.

## 2022-02-07 NOTE — Assessment & Plan Note (Signed)
Status post left renal artery stent and subsequent additional stent placement for recurrent stenosis over 5 years ago.  Duplex today shows the left renal artery velocities to be normal in the left kidney size to be normal with a small right kidney.  Her aortobiiliac bypass appears patent.  No role for intervention.  Since we are over 5 years out from her last intervention, I think we can go to checking this on an annual basis.  Continue current medical regimen.

## 2022-04-02 DIAGNOSIS — R739 Hyperglycemia, unspecified: Secondary | ICD-10-CM | POA: Diagnosis not present

## 2022-04-02 DIAGNOSIS — E039 Hypothyroidism, unspecified: Secondary | ICD-10-CM | POA: Diagnosis not present

## 2022-04-02 DIAGNOSIS — I150 Renovascular hypertension: Secondary | ICD-10-CM | POA: Diagnosis not present

## 2022-04-02 DIAGNOSIS — E782 Mixed hyperlipidemia: Secondary | ICD-10-CM | POA: Diagnosis not present

## 2022-04-02 DIAGNOSIS — N1831 Chronic kidney disease, stage 3a: Secondary | ICD-10-CM | POA: Diagnosis not present

## 2022-04-09 DIAGNOSIS — Z0000 Encounter for general adult medical examination without abnormal findings: Secondary | ICD-10-CM | POA: Diagnosis not present

## 2022-04-09 DIAGNOSIS — Z1389 Encounter for screening for other disorder: Secondary | ICD-10-CM | POA: Diagnosis not present

## 2022-05-01 ENCOUNTER — Ambulatory Visit
Payer: Medicare HMO | Attending: Student in an Organized Health Care Education/Training Program | Admitting: Student in an Organized Health Care Education/Training Program

## 2022-05-01 ENCOUNTER — Encounter: Payer: Self-pay | Admitting: Student in an Organized Health Care Education/Training Program

## 2022-05-01 VITALS — BP 178/81 | HR 72 | Temp 97.3°F | Resp 16 | Ht 64.0 in | Wt 175.0 lb

## 2022-05-01 DIAGNOSIS — M5030 Other cervical disc degeneration, unspecified cervical region: Secondary | ICD-10-CM

## 2022-05-01 DIAGNOSIS — M5416 Radiculopathy, lumbar region: Secondary | ICD-10-CM

## 2022-05-01 DIAGNOSIS — M51369 Other intervertebral disc degeneration, lumbar region without mention of lumbar back pain or lower extremity pain: Secondary | ICD-10-CM

## 2022-05-01 DIAGNOSIS — M5136 Other intervertebral disc degeneration, lumbar region: Secondary | ICD-10-CM | POA: Insufficient documentation

## 2022-05-01 DIAGNOSIS — G894 Chronic pain syndrome: Secondary | ICD-10-CM | POA: Diagnosis not present

## 2022-05-01 DIAGNOSIS — M47816 Spondylosis without myelopathy or radiculopathy, lumbar region: Secondary | ICD-10-CM | POA: Diagnosis not present

## 2022-05-01 MED ORDER — HYDROCODONE-ACETAMINOPHEN 5-325 MG PO TABS: 1.0000 | ORAL_TABLET | Freq: Two times a day (BID) | ORAL | 0 refills | Status: DC | PRN

## 2022-05-01 MED ORDER — HYDROCODONE-ACETAMINOPHEN 5-325 MG PO TABS: 1.0000 | ORAL_TABLET | Freq: Two times a day (BID) | ORAL | 0 refills | Status: AC | PRN

## 2022-05-01 NOTE — Progress Notes (Signed)
Nursing Pain Medication Assessment:  Safety precautions to be maintained throughout the outpatient stay will include: orient to surroundings, keep bed in low position, maintain call bell within reach at all times, provide assistance with transfer out of bed and ambulation.  Medication Inspection Compliance: Pill count conducted under aseptic conditions, in front of the patient. Neither the pills nor the bottle was removed from the patient's sight at any time. Once count was completed pills were immediately returned to the patient in their original bottle.  Medication: Hydrocodone/APAP Pill/Patch Count:  12.5 of 60 pills remain Pill/Patch Appearance: Markings consistent with prescribed medication Bottle Appearance: Standard pharmacy container. Clearly labeled. Filled Date: 08 / 02 / 2023 Last Medication intake:  yesterday

## 2022-05-01 NOTE — Progress Notes (Signed)
PROVIDER NOTE: Information contained herein reflects review and annotations entered in association with encounter. Interpretation of such information and data should be left to medically-trained personnel. Information provided to patient can be located elsewhere in the medical record under "Patient Instructions". Document created using STT-dictation technology, any transcriptional errors that may result from process are unintentional.    Patient: Tamara Finley  Service Category: E/M  Provider: Gillis Santa, MD  DOB: May 13, 1941  DOS: 05/01/2022  Specialty: Interventional Pain Management  MRN: [id number]  Setting: Ambulatory outpatient  PCP: Adin Hector, MD  Type: Established Patient    Referring Provider: Adin Hector, MD  Location: Office  Delivery: Face-to-face     HPI  [name], a 81 y.o. year old female, is here today because of her Chronic pain syndrome [G89.4]. Tamara Finley primary complain today is Back Pain (lower)  Last encounter: My last encounter with her was on 02/04/2022 Pertinent problems: Tamara Finley has Low back pain radiating to both legs; Peripheral arterial disease ([place]); Right hip pain; Chronic SI joint pain; Lumbar facet arthropathy (L4/L5 and L5/S1); Controlled substance agreement signed; Lumbar spondylosis; and Lumbar radiculopathy on their pertinent problem list. Pain Assessment: Severity of Chronic pain is reported as a 7 /10. Location: Back Right/to right knee. Onset: More than a month ago. Quality: Aching. Timing: Constant. Modifying factor(s):  Marland Kitchen Vitals:  height is 5' 4"  (1.626 m) and weight is 175 lb (79.4 kg). Her temporal temperature is 97.3 F (36.3 C) (abnormal). Her blood pressure is 178/81 (abnormal) and her pulse is 72. Her respiration is 16 and oxygen saturation is 98%.   Reason for encounter: medication management.   No change in medical history since last visit.  Patient's pain is at baseline.  Patient continues multimodal pain regimen as  prescribed.  States that it provides pain relief and improvement in functional status. Patient continues to grieve the loss of her husband which occurred last February.  She states that she has good days and bad days.  She does endorse increased pain at times.  I informed her how depression, anxiety, grieving, altered mental health stays especially after trauma can also increase overall pain experience.  Patient states that she does have family members to help support her although his loss has been very tough for her.  No side effects with her current medications although she did experience mild constipation this morning.  She does have a stool softener that she utilizes.  We do need to renew her urine toxicology screen however she states that she has a home health appointment at 1 PM so we will repeat it at her next visit.  Tamara Finley has always been compliant with therapy.  No concern of misuse or abuse of her medications.  Follow-up in 3 months for medication management.  Pharmacotherapy Assessment  Analgesic: Hydrocodone 5 mg two times daily prn, quantity 60/month    Monitoring: [place] PMP: PDMP reviewed during this encounter.       Pharmacotherapy: No side-effects or adverse reactions reported. Compliance: No problems identified. Effectiveness: Clinically acceptable.  Rise Patience, RN  05/01/2022 11:52 AM  Sign when Signing Visit Nursing Pain Medication Assessment:  Safety precautions to be maintained throughout the outpatient stay will include: orient to surroundings, keep bed in low position, maintain call bell within reach at all times, provide assistance with transfer out of bed and ambulation.  Medication Inspection Compliance: Pill count conducted under aseptic conditions, in front of the patient. Neither  the pills nor the bottle was removed from the patient's sight at any time. Once count was completed pills were immediately returned to the patient in their original bottle.  Medication:  Hydrocodone/APAP Pill/Patch Count:  12.5 of 60 pills remain Pill/Patch Appearance: Markings consistent with prescribed medication Bottle Appearance: Standard pharmacy container. Clearly labeled. Filled Date: 08 / 02 / 2023 Last Medication intake:  yesterday     UDS:  Summary  Date Value Ref Range Status  04/30/2021 Note  Final    Comment:    ==================================================================== ToxASSURE Select 13 (MW) ==================================================================== Test                             Result       Flag       Units  Drug Present and Declared for Prescription Verification   Norhydrocodone                 223          EXPECTED   ng/mg creat    Norhydrocodone is an expected metabolite of hydrocodone.  Drug Absent but Declared for Prescription Verification   Hydrocodone                    Not Detected UNEXPECTED ng/mg creat    Hydrocodone is almost always present in patients taking this drug    consistently. Absence of hydrocodone could be due to lapse of time    since the last dose or unusual pharmacokinetics (rapid metabolism).  ==================================================================== Test                      Result    Flag   Units      Ref Range   Creatinine              26               mg/dL      >=20 ==================================================================== Declared Medications:  The flagging and interpretation on this report are based on the  following declared medications.  Unexpected results may arise from  inaccuracies in the declared medications.   **Note: The testing scope of this panel includes these medications:   Hydrocodone (Norco)   **Note: The testing scope of this panel does not include the  following reported medications:   Acetaminophen (Tylenol)  Acetaminophen (Norco)  Albuterol (Ventolin HFA)  Anastrozole (Arimidex)  Aspirin  Atorvastatin (Lipitor)  Azelastine (Astelin)   Calcium  Carvedilol (Coreg)  Cyanocobalamin  Fluticasone (Flonase)  Furosemide (Lasix)  Gabapentin (Neurontin)  Hydralazine (Apresoline)  Levothyroxine (Synthroid)  Mirtazapine (Remeron)  Montelukast (Singulair)  Pantoprazole (Protonix)  Potassium (Klor-Con)  Vitamin D ==================================================================== For clinical consultation, please call [phone]. ====================================================================      ROS  Constitutional: Denies any fever or chills Gastrointestinal: No reported hemesis, hematochezia, vomiting, or acute GI distress Musculoskeletal:  low back pain Neurological: No reported episodes of acute onset apraxia, aphasia, dysarthria, agnosia, amnesia, paralysis, loss of coordination, or loss of consciousness  Medication Review  Calcium 600+D Plus Minerals, DSS, HYDROcodone-acetaminophen, acetaminophen, albuterol, anastrozole, aspirin, atorvastatin, azelastine, carvedilol, cyanocobalamin, fluticasone, furosemide, gabapentin, hydrALAZINE, levothyroxine, mirtazapine, montelukast, pantoprazole, and potassium chloride SA  History Review  Allergy: Tamara Finley is allergic to benadryl [diphenhydramine hcl (sleep)] and alprazolam. Drug: Tamara Finley  reports no history of drug use. Alcohol:  reports no history of alcohol use. Tobacco:  reports that she quit smoking about 8 years  ago. Her smoking use included cigarettes. She has a 100.00 pack-year smoking history. She has never used smokeless tobacco. Social: Tamara Finley  reports that she quit smoking about 8 years ago. Her smoking use included cigarettes. She has a 100.00 pack-year smoking history. She has never used smokeless tobacco. She reports that she does not drink alcohol and does not use drugs. Medical:  has a past medical history of Abdominal aortic aneurysm (AAA) ([place]) (2000's), Age related entropion, unspecified laterality, Anxiety, Anxiety and depression, Bilateral  renal artery stenosis ([place]), Breast calcification, left, Cancer ([place]) (10/20/2016), Carotid artery thrombosis, bilateral, Carotid stenosis, Chronic bronchitis (HCC), Chronic insomnia, COPD (chronic obstructive pulmonary disease) ([place]), Coronary artery disease, CRI (chronic renal insufficiency), stage 3 (moderate) ([place]), Depression, Diabetes mellitus without complication ([place]), GERD (gastroesophageal reflux disease), Heart palpitations, History of AAA (abdominal aortic aneurysm) repair, History of colon polyps, History of TIA (transient ischemic attack), Hypercholesterolemia, Hypertension, Hypothyroidism, Migraine headache, Osteoarthritis, Osteoporosis, Peripheral arterial disease ([place]), Pulmonary scarring, Renal disorder, SOB (shortness of breath), 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 Surgical: Tamara Finley  has a past surgical history that includes Cardiac surgery; Abdominal aortic aneurysm repair; Cardiac catheterization (N/A, 12/05/2015); Cardiac catheterization (Left, 12/31/2015); Breast excisional biopsy (Left, 2010); Breast excisional biopsy (Left, 07/01/2006); Breast biopsy (Left, 09/03/2016); Breast biopsy (Left, 09/25/2016); Bronchoscopy; CAROTID PTA/STENT INTERVENTION; Colonoscopy; Eye surgery; Laminectomy (07/25/2013); Back surgery; Mediastinoscopy; Mastectomy; Thoracoscopy (12/16/2016); and Colonoscopy with propofol (N/A, 06/15/2017). Family: family history includes CVA in her mother; Cancer in her brother; Coronary artery disease in her father; Dementia in her father; Diabetes in her brother; Epilepsy in her mother; Hypertension in her father and mother; Ovarian cancer in her sister; Stroke in her father and mother.  Laboratory Chemistry Profile   Renal Lab Results  Component Value Date   BUN 9 01/01/2016   CREATININE 1.03 (H) 01/01/2016   GFRAA >60 01/01/2016   GFRNONAA  53 (L) 01/01/2016    Hepatic Lab Results  Component Value Date   AST 28 10/17/2015   ALT 17 10/17/2015   ALBUMIN 3.7 10/17/2015   ALKPHOS 64 10/17/2015    Electrolytes Lab Results  Component Value Date   NA 139 01/01/2016   K 3.5 01/01/2016   CL 106 01/01/2016   CALCIUM 8.2 (L) 01/01/2016    Bone No results found for: "VD25OH", "VD125OH2TOT", "KN3976BH4", "LP3790WI0", "25OHVITD1", "25OHVITD2", "25OHVITD3", "TESTOFREE", "TESTOSTERONE"  Inflammation (CRP: Acute Phase) (ESR: Chronic Phase) Lab Results  Component Value Date   ESRSEDRATE 23 10/12/2015         Note: Above Lab results reviewed.  Recent Imaging Review  VAS US RENAL ARTERY DUPLEX ABDOMINAL VISCERAL  Patient Name:  RAMEEN GOHLKE  Date of Exam:   02/07/2022 Medical Rec #: [id number]       Accession #:    [id number] Date of Birth: [birth year]      Patient Gender: F Patient Age:   83 years Exam Location:  [facility] Procedure:      VAS US RENAL ARTERY DUPLEX Referring Phys: [id number] Erskine Squibb DEW  --------------------------------------------------------------------------------  Indications: RAS  Vascular Interventions: 04/17: Lt Renal Stent and Aorta Bi Iliac BPG.                           05/17: Carotid/Renal Stent.  Comparison Study: 07/02/2021  Performing Technologist: Almira Coaster RVS    Examination Guidelines: A  complete evaluation includes B-mode imaging, spectral Doppler, color Doppler, and power Doppler as needed of all accessible portions of each vessel. Bilateral testing is considered an integral part of a complete examination. Limited examinations for reoccurring indications may be performed as noted.    Duplex Findings: +----------+--------+--------+------+--------+ MesentericPSV cm/sEDV cm/sPlaqueComments +----------+--------+--------+------+--------+ Aorta Prox   54      16                  +----------+--------+--------+------+--------+ Aorta Mid    84      16                   +----------+--------+--------+------+--------+          +------------------+--------+--------+-------+ Right Renal ArteryPSV cm/sEDV cm/sComment +------------------+--------+--------+-------+ Origin               48      11           +------------------+--------+--------+-------+ Proximal             62      11           +------------------+--------+--------+-------+ Mid                  63      14           +------------------+--------+--------+-------+ Distal               60      15           +------------------+--------+--------+-------+  +-----------------+--------+--------+-------+ Left Renal ArteryPSV cm/sEDV cm/sComment +-----------------+--------+--------+-------+ Proximal            89      12           +-----------------+--------+--------+-------+ Mid                 85      12           +-----------------+--------+--------+-------+ Distal              47      12           +-----------------+--------+--------+-------+  +------------+--------+--------+--+-----------+--------+--------+---+ Right KidneyPSV cm/sEDV cm/sRILeft KidneyPSV cm/sEDV cm/sRI  +------------+--------+--------+--+-----------+--------+--------+---+ Upper Pole                    Upper Pole                     +------------+--------+--------+--+-----------+--------+--------+---+ Mid                           Mid                            +------------+--------+--------+--+-----------+--------+--------+---+ Lower Pole                    Lower Pole                     +------------+--------+--------+--+-----------+--------+--------+---+ Hilar                         Hilar                          +------------+--------+--------+--+-----------+--------+--------+---+  +------------------+----+------------------+-----+ Right Kidney          Left Kidney              +------------------+----+------------------+-----+  RAR                   RAR                     +------------------+----+------------------+-----+ RAR (manual)      .75 RAR (manual)      1.06  +------------------+----+------------------+-----+ Cortex                Cortex                  +------------------+----+------------------+-----+ Cortex thickness      Corex thickness         +------------------+----+------------------+-----+ Kidney length (cm)7.01Kidney length (cm)11.06 +------------------+----+------------------+-----+    Summary: Largest Aortic Diameter: 2.1 cm   Renal:   Right: Abnormal size for the right kidney. 1-59% stenosis of the        right renal artery. RRV flow present. Normal right Resisitive        Index. Left:  1-59% stenosis of the left renal artery. LRV flow present.        Normal size of left kidney. Normal left Resistive Index.   *See table(s) above for measurements and observations.   Diagnosing physician: Leotis Pain MD   Electronically signed by Leotis Pain MD on 02/12/2022 at 9:40:14 AM.       Final   VAS US CAROTID Carotid Arterial Duplex Study  Patient Name:  SHEKELA GOODRIDGE  Date of Exam:   02/07/2022 Medical Rec #: [id number]       Accession #:    [id number] Date of Birth: August 30, 1941      Patient Gender: F Patient Age:   41 years Exam Location:  [facility] Procedure:      VAS US CAROTID Referring Phys: Leotis Pain  --------------------------------------------------------------------------------   Indications:       Carotid artery disease. Other Factors:     04/17: Lt Renal Stent and Aorta Bi Iliac BPG.                      05/17: Carotid/Renal Stent. Comparison Study:  07/02/2021  Performing Technologist: Almira Coaster RVS    Examination Guidelines: A complete evaluation includes B-mode imaging, spectral Doppler, color Doppler, and power Doppler as needed of all accessible portions of each  vessel. Bilateral testing is considered an integral part of a complete examination. Limited examinations for reoccurring indications may be performed as noted.    Right Carotid Findings: +----------+--------+--------+--------+------------------+--------+           PSV cm/sEDV cm/sStenosisPlaque DescriptionComments +----------+--------+--------+--------+------------------+--------+ CCA Prox  106     19                                         +----------+--------+--------+--------+------------------+--------+ CCA Mid   75      17                                         +----------+--------+--------+--------+------------------+--------+ CCA Distal102     18                                         +----------+--------+--------+--------+------------------+--------+ ICA Prox  209  44                                         +----------+--------+--------+--------+------------------+--------+ ICA Mid   158     30                                         +----------+--------+--------+--------+------------------+--------+ ICA Distal122     25                                         +----------+--------+--------+--------+------------------+--------+ ECA       266     24                                         +----------+--------+--------+--------+------------------+--------+  +----------+--------+-------+--------+-------------------+           PSV cm/sEDV cmsDescribeArm Pressure (mmHG) +----------+--------+-------+--------+-------------------+ [id number]      0                                  +----------+--------+-------+--------+-------------------+  +---------+--------+--+--------+--+ VertebralPSV cm/s94EDV cm/s20 +---------+--------+--+--------+--+     Left Carotid Findings: +----------+--------+--------+--------+------------------+--------+           PSV cm/sEDV cm/sStenosisPlaque  DescriptionComments +----------+--------+--------+--------+------------------+--------+ CCA Prox  69      23                                         +----------+--------+--------+--------+------------------+--------+ CCA Mid   67      22                                         +----------+--------+--------+--------+------------------+--------+ CCA Distal63      16                                         +----------+--------+--------+--------+------------------+--------+ ICA Prox  118     31                                         +----------+--------+--------+--------+------------------+--------+ ICA Mid   48      7                                          +----------+--------+--------+--------+------------------+--------+ ICA Distal107     29                                         +----------+--------+--------+--------+------------------+--------+ ECA       166     9                                          +----------+--------+--------+--------+------------------+--------+  +----------+--------+--------+--------+-------------------+  PSV cm/sEDV cm/sDescribeArm Pressure (mmHG) +----------+--------+--------+--------+-------------------+ [id number]                                         +----------+--------+--------+--------+-------------------+  +---------+--------+--+--------+--+ VertebralPSV cm/s66EDV cm/s14 +---------+--------+--+--------+--+        Summary: Right Carotid: Velocities in the right ICA are consistent with a 40-59%                stenosis.  Left Carotid: Velocities in the left ICA are consistent with a 40-59% stenosis.  Vertebrals:  Bilateral vertebral arteries demonstrate antegrade flow. Subclavians: Normal flow hemodynamics were seen in bilateral subclavian              arteries.  *See table(s) above for measurements and observations.    Electronically signed by Leotis Pain  MD on 02/12/2022 at 9:39:49 AM.      Final    Note: Reviewed        Physical Exam  General appearance: Well nourished, well developed, and well hydrated. In no apparent acute distress Mental status: Alert, oriented x 3 (person, place, & time)       Respiratory: No evidence of acute respiratory distress Eyes: PERLA Vitals: BP (!) 178/81   Pulse 72   Temp (!) 97.3 F (36.3 C) (Temporal)   Resp 16   Ht 5' 4"  (1.626 m)   Wt 175 lb (79.4 kg)   SpO2 98%   BMI 30.04 kg/m  BMI: Estimated body mass index is 30.04 kg/m as calculated from the following:   Height as of this encounter: 5' 4"  (1.626 m).   Weight as of this encounter: 175 lb (79.4 kg). Ideal: Ideal body weight: 54.7 kg (120 lb 9.5 oz) Adjusted ideal body weight: 64.6 kg (142 lb 5.7 oz)  Chronic low back pain  Assessment   Status Diagnosis  Controlled Controlled Controlled 1. Chronic pain syndrome   2. Lumbar spondylosis   3. Lumbar facet arthropathy (L4/L5 and L5/S1)   4. Lumbar radiculopathy   5. Lumbar degenerative disc disease   6. DDD (degenerative disc disease), cervical       Plan of Care    Tamara Finley has a current medication list which includes the following long-term medication(s): albuterol, azelastine, calcium 600+d plus minerals, carvedilol, furosemide, gabapentin, levothyroxine, levothyroxine, mirtazapine, montelukast, and pantoprazole.  Pharmacotherapy (Medications Ordered): Meds ordered this encounter  Medications   HYDROcodone-acetaminophen (NORCO/VICODIN) 5-325 MG tablet    Sig: Take 1 tablet by mouth 2 (two) times daily as needed for moderate pain.    Dispense:  60 tablet    Refill:  0   HYDROcodone-acetaminophen (NORCO/VICODIN) 5-325 MG tablet    Sig: Take 1 tablet by mouth 2 (two) times daily as needed for moderate pain.    Dispense:  60 tablet    Refill:  0   HYDROcodone-acetaminophen (NORCO/VICODIN) 5-325 MG tablet    Sig: Take 1 tablet by mouth 2 (two) times daily as needed  for moderate pain.    Dispense:  60 tablet    Refill:  0   Orders Placed This Encounter  Procedures   ToxASSURE Select 13 (MW), Urine    Volume: 30 ml(s). Minimum 3 ml of urine is needed. Document temperature of fresh sample. Indications: Long term (current) use of opiate analgesic (E76.147)    Order Specific Question:   Release to patient    Answer:   Immediate  Follow-up plan:   Return in about 3 months (around 07/31/2022) for Medication Management, in person.     consider RFA in future, Sprint PNS of medial branch at L4          Recent Visits Date Type Provider Dept  02/04/22 Office Visit Gillis Santa, MD [facility]  Showing recent visits within past 90 days and meeting all other requirements Today's Visits Date Type Provider Dept  05/01/22 Office Visit Gillis Santa, MD [facility]  Showing today's visits and meeting all other requirements Future Appointments Date Type Provider Dept  07/22/22 Appointment Gillis Santa, MD [facility]  Showing future appointments within next 90 days and meeting all other requirements  I discussed the assessment and treatment plan with the patient. The patient was provided an opportunity to ask questions and all were answered. The patient agreed with the plan and demonstrated an understanding of the instructions.  Patient advised to call back or seek an in-person evaluation if the symptoms or condition worsens.  Duration of encounter: 31mnutes.  Note by: BGillis Santa MD Date: 05/01/2022; Time: 1:38 PM

## 2022-05-06 DIAGNOSIS — I10 Essential (primary) hypertension: Secondary | ICD-10-CM | POA: Diagnosis not present

## 2022-05-06 DIAGNOSIS — R600 Localized edema: Secondary | ICD-10-CM | POA: Diagnosis not present

## 2022-05-06 DIAGNOSIS — N1831 Chronic kidney disease, stage 3a: Secondary | ICD-10-CM | POA: Diagnosis not present

## 2022-05-06 DIAGNOSIS — R809 Proteinuria, unspecified: Secondary | ICD-10-CM | POA: Diagnosis not present

## 2022-05-06 DIAGNOSIS — I701 Atherosclerosis of renal artery: Secondary | ICD-10-CM | POA: Diagnosis not present

## 2022-05-06 DIAGNOSIS — D631 Anemia in chronic kidney disease: Secondary | ICD-10-CM | POA: Diagnosis not present

## 2022-06-30 ENCOUNTER — Encounter (INDEPENDENT_AMBULATORY_CARE_PROVIDER_SITE_OTHER): Payer: Self-pay

## 2022-07-22 ENCOUNTER — Ambulatory Visit
Payer: Medicare HMO | Attending: Student in an Organized Health Care Education/Training Program | Admitting: Student in an Organized Health Care Education/Training Program

## 2022-07-22 ENCOUNTER — Encounter: Payer: Self-pay | Admitting: Student in an Organized Health Care Education/Training Program

## 2022-07-22 VITALS — HR 60 | Temp 97.3°F | Resp 16 | Ht 64.0 in | Wt 175.0 lb

## 2022-07-22 DIAGNOSIS — M47816 Spondylosis without myelopathy or radiculopathy, lumbar region: Secondary | ICD-10-CM | POA: Diagnosis not present

## 2022-07-22 DIAGNOSIS — M533 Sacrococcygeal disorders, not elsewhere classified: Secondary | ICD-10-CM | POA: Insufficient documentation

## 2022-07-22 DIAGNOSIS — M51369 Other intervertebral disc degeneration, lumbar region without mention of lumbar back pain or lower extremity pain: Secondary | ICD-10-CM

## 2022-07-22 DIAGNOSIS — M5136 Other intervertebral disc degeneration, lumbar region: Secondary | ICD-10-CM | POA: Insufficient documentation

## 2022-07-22 DIAGNOSIS — M542 Cervicalgia: Secondary | ICD-10-CM

## 2022-07-22 DIAGNOSIS — M5416 Radiculopathy, lumbar region: Secondary | ICD-10-CM

## 2022-07-22 DIAGNOSIS — G8929 Other chronic pain: Secondary | ICD-10-CM

## 2022-07-22 DIAGNOSIS — G894 Chronic pain syndrome: Secondary | ICD-10-CM

## 2022-07-22 DIAGNOSIS — M5030 Other cervical disc degeneration, unspecified cervical region: Secondary | ICD-10-CM | POA: Diagnosis not present

## 2022-07-22 DIAGNOSIS — M4726 Other spondylosis with radiculopathy, lumbar region: Secondary | ICD-10-CM | POA: Diagnosis not present

## 2022-07-22 MED ORDER — HYDROCODONE-ACETAMINOPHEN 5-325 MG PO TABS: 1.0000 | ORAL_TABLET | Freq: Two times a day (BID) | ORAL | 0 refills | Status: AC | PRN

## 2022-07-22 MED ORDER — HYDROCODONE-ACETAMINOPHEN 5-325 MG PO TABS: 1.0000 | ORAL_TABLET | Freq: Two times a day (BID) | ORAL | 0 refills | Status: DC | PRN

## 2022-07-22 NOTE — Progress Notes (Signed)
Nursing Pain Medication Assessment:  Safety precautions to be maintained throughout the outpatient stay will include: orient to surroundings, keep bed in low position, maintain call bell within reach at all times, provide assistance with transfer out of bed and ambulation.  Medication Inspection Compliance: Pill count conducted under aseptic conditions, in front of the patient. Neither the pills nor the bottle was removed from the patient's sight at any time. Once count was completed pills were immediately returned to the patient in their original bottle.  Medication: See above Pill/Patch Count:  45.5 of 60 pills remain Pill/Patch Appearance: Markings consistent with prescribed medication Bottle Appearance: Standard pharmacy container. Clearly labeled. Filled Date: 11/ 13 / 2023 Last Medication intake:  Today

## 2022-07-22 NOTE — Progress Notes (Signed)
PROVIDER NOTE: Information contained herein reflects review and annotations entered in association with encounter. Interpretation of such information and data should be left to medically-trained personnel. Information provided to patient can be located elsewhere in the medical record under "Patient Instructions". Document created using STT-dictation technology, any transcriptional errors that may result from process are unintentional.    Patient: Tamara Finley  Service Category: E/M  Provider: Gillis Santa, MD  DOB: Oct 13, 1940  DOS: 07/22/2022  Specialty: Interventional Pain Management  MRN: [id number]  Setting: Ambulatory outpatient  PCP: Adin Hector, MD  Type: Established Patient    Referring Provider: Adin Hector, MD  Location: Office  Delivery: Face-to-face     HPI  [name], a 81 y.o. year old female, is here today because of her Chronic pain syndrome [G89.4]. Tamara Finley primary complain today is Back Pain  Last encounter: My last encounter with her was on 05/01/22  Pertinent problems: Tamara Finley has Low back pain radiating to both legs; Peripheral arterial disease ([place]); Right hip pain; Chronic SI joint pain; Lumbar facet arthropathy (L4/L5 and L5/S1); Controlled substance agreement signed; Lumbar spondylosis; and Lumbar radiculopathy on their pertinent problem list. Pain Assessment: Severity of Chronic pain is reported as a 8 /10. Location: Back Lower/hips bilateral down back of right leg to calf. Onset: More than a month ago. Quality: Aching, Constant, Discomfort, Radiating, Nagging. Timing: Constant. Modifying factor(s): rest, lay in bed. Vitals:  height is _0  (1.626 m) and weight is 175 lb (79.4 kg). Her temperature is 97.3 F (36.3 C) (abnormal). Her pulse is 60. Her respiration is 16 and oxygen saturation is 97%.   Reason for encounter: medication management.   No change in medical history since last visit.  Patient's pain is at baseline.  Patient continues  multimodal pain regimen as prescribed.  States that it provides pain relief and improvement in functional status. Patient continues to grieve the loss of her husband which occurred last February.  Patient states that she does have family members to help support her although his loss has been very tough for her.  No side effects with her current medications, mild constipation intermittently. She does have a stool softener that she utilizes.  We do need to renew her urine toxicology screen.  Tamara Finley has always been compliant with therapy.  No concern of misuse or abuse of her medications.  Follow-up in 3 months for medication management.  Pharmacotherapy Assessment  Analgesic: Hydrocodone 5 mg two times daily prn, quantity 60/month    Monitoring: [place] PMP: PDMP reviewed during this encounter.       Pharmacotherapy: No side-effects or adverse reactions reported. Compliance: No problems identified. Effectiveness: Clinically acceptable.  Tamara Specking, Tamara Finley  07/22/2022  2:16 PM  Sign when Signing Visit Nursing Pain Medication Assessment:  Safety precautions to be maintained throughout the outpatient stay will include: orient to surroundings, keep bed in low position, maintain call bell within reach at all times, provide assistance with transfer out of bed and ambulation.  Medication Inspection Compliance: Pill count conducted under aseptic conditions, in front of the patient. Neither the pills nor the bottle was removed from the patient's sight at any time. Once count was completed pills were immediately returned to the patient in their original bottle.  Medication: See above Pill/Patch Count:  45.5 of 60 pills remain Pill/Patch Appearance: Markings consistent with prescribed medication Bottle Appearance: Standard pharmacy container. Clearly labeled. Filled Date: 11/ 13 / 2023 Last  Medication intake:  Today     UDS:  Summary  Date Value Ref Range Status  04/30/2021 Note  Final    Comment:     ==================================================================== ToxASSURE Select 13 (MW) ==================================================================== Test                             Result       Flag       Units  Drug Present and Declared for Prescription Verification   Norhydrocodone                 223          EXPECTED   ng/mg creat    Norhydrocodone is an expected metabolite of hydrocodone.  Drug Absent but Declared for Prescription Verification   Hydrocodone                    Not Detected UNEXPECTED ng/mg creat    Hydrocodone is almost always present in patients taking this drug    consistently. Absence of hydrocodone could be due to lapse of time    since the last dose or unusual pharmacokinetics (rapid metabolism).  ==================================================================== Test                      Result    Flag   Units      Ref Range   Creatinine              26               mg/dL      >=20 ==================================================================== Declared Medications:  The flagging and interpretation on this report are based on the  following declared medications.  Unexpected results may arise from  inaccuracies in the declared medications.   **Note: The testing scope of this panel includes these medications:   Hydrocodone (Norco)   **Note: The testing scope of this panel does not include the  following reported medications:   Acetaminophen (Tylenol)  Acetaminophen (Norco)  Albuterol (Ventolin HFA)  Anastrozole (Arimidex)  Aspirin  Atorvastatin (Lipitor)  Azelastine (Astelin)  Calcium  Carvedilol (Coreg)  Cyanocobalamin  Fluticasone (Flonase)  Furosemide (Lasix)  Gabapentin (Neurontin)  Hydralazine (Apresoline)  Levothyroxine (Synthroid)  Mirtazapine (Remeron)  Montelukast (Singulair)  Pantoprazole (Protonix)  Potassium (Klor-Con)  Vitamin D ==================================================================== For  clinical consultation, please call [phone]. ====================================================================      ROS  Constitutional: Denies any fever or chills Gastrointestinal: No reported hemesis, hematochezia, vomiting, or acute GI distress Musculoskeletal:  low back pain Neurological: No reported episodes of acute onset apraxia, aphasia, dysarthria, agnosia, amnesia, paralysis, loss of coordination, or loss of consciousness  Medication Review  Calcium 600+D Plus Minerals, DSS, HYDROcodone-acetaminophen, acetaminophen, albuterol, anastrozole, aspirin, atorvastatin, azelastine, carvedilol, cyanocobalamin, fluticasone, furosemide, gabapentin, hydrALAZINE, levothyroxine, mirtazapine, montelukast, pantoprazole, and potassium chloride SA  History Review  Allergy: Tamara Finley is allergic to benadryl [diphenhydramine hcl (sleep)] and alprazolam. Drug: Tamara Finley  reports no history of drug use. Alcohol:  reports no history of alcohol use. Tobacco:  reports that she quit smoking about 9 years ago. Her smoking use included cigarettes. She has a 100.00 pack-year smoking history. She has never used smokeless tobacco. Social: Tamara Finley  reports that she quit smoking about 9 years ago. Her smoking use included cigarettes. She has a 100.00 pack-year smoking history. She has never used smokeless tobacco. She reports that she does not drink alcohol and does not use  drugs. Medical:  has a past medical history of Abdominal aortic aneurysm (AAA) ([place]) (2000's), Age related entropion, unspecified laterality, Anxiety, Anxiety and depression, Bilateral renal artery stenosis ([place]), Breast calcification, left, Cancer ([place]) (10/20/2016), Carotid artery thrombosis, bilateral, Carotid stenosis, Chronic bronchitis ([facility]), Chronic insomnia, COPD (chronic obstructive pulmonary disease) ([place]), Coronary artery disease, CRI (chronic renal insufficiency), stage 3 (moderate) ([place]), Depression, Diabetes mellitus  without complication (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 Surgical: Tamara Finley  has a past surgical history that includes Cardiac surgery; Abdominal aortic aneurysm repair; Cardiac catheterization (N/A, 12/05/2015); Cardiac catheterization (Left, 12/31/2015); Breast excisional biopsy (Left, 2010); Breast excisional biopsy (Left, 07/01/2006); Breast biopsy (Left, 09/03/2016); Breast biopsy (Left, 09/25/2016); Bronchoscopy; CAROTID PTA/STENT INTERVENTION; Colonoscopy; Eye surgery; Laminectomy (07/25/2013); Back surgery; Mediastinoscopy; Mastectomy; Thoracoscopy (12/16/2016); and Colonoscopy with propofol (N/A, 06/15/2017). Family: family history includes CVA in her mother; Cancer in her brother; Coronary artery disease in her father; Dementia in her father; Diabetes in her brother; Epilepsy in her mother; Hypertension in her father and mother; Ovarian cancer in her sister; Stroke in her father and mother.  Laboratory Chemistry Profile   Renal Lab Results  Component Value Date   BUN 9 01/01/2016   CREATININE 1.03 (H) 01/01/2016   GFRAA >60 01/01/2016   GFRNONAA 53 (L) 01/01/2016    Hepatic Lab Results  Component Value Date   AST 28 10/17/2015   ALT 17 10/17/2015   ALBUMIN 3.7 10/17/2015   ALKPHOS 64 10/17/2015    Electrolytes Lab Results  Component Value Date   NA 139 01/01/2016   K 3.5 01/01/2016   CL 106 01/01/2016   CALCIUM 8.2 (L) 01/01/2016    Bone No results found for: "VD25OH",  "VD125OH2TOT", "SJ6283MO2", "HU7654YT0", "25OHVITD1", "25OHVITD2", "25OHVITD3", "TESTOFREE", "TESTOSTERONE"  Inflammation (CRP: Acute Phase) (ESR: Chronic Phase) Lab Results  Component Value Date   ESRSEDRATE 23 10/12/2015         Note: Above Lab results reviewed.  Recent Imaging Review  VAS US RENAL ARTERY DUPLEX ABDOMINAL VISCERAL  Patient Name:  Tamara Finley  Date of Exam:   02/07/2022 Medical Rec #: [id number]       Accession #:    [id number] Date of Birth: 03-08-1941      Patient Gender: F Patient Age:   56 years Exam Location:  [facility] Procedure:      VAS US RENAL ARTERY DUPLEX Referring Phys: [id number] Erskine Squibb DEW  --------------------------------------------------------------------------------  Indications: RAS  Vascular Interventions: 04/17: Lt Renal Stent and Aorta Bi Iliac BPG.                           05/17: Carotid/Renal Stent.  Comparison Study: 07/02/2021  Performing Technologist: Almira Coaster RVS    Examination Guidelines: A complete evaluation includes B-mode imaging, spectral Doppler, color Doppler, and power Doppler as needed of all accessible portions of each vessel. Bilateral testing is considered an integral part of a complete examination. Limited examinations for reoccurring indications may be performed as noted.    Duplex Findings: +----------+--------+--------+------+--------+ MesentericPSV cm/sEDV cm/sPlaqueComments +----------+--------+--------+------+--------+ Aorta Prox   54  16                  +----------+--------+--------+------+--------+ Aorta Mid    84      16                  +----------+--------+--------+------+--------+          +------------------+--------+--------+-------+ Right Renal ArteryPSV cm/sEDV cm/sComment +------------------+--------+--------+-------+ Origin               48      11           +------------------+--------+--------+-------+ Proximal              62      11           +------------------+--------+--------+-------+ Mid                  63      14           +------------------+--------+--------+-------+ Distal               60      15           +------------------+--------+--------+-------+  +-----------------+--------+--------+-------+ Left Renal ArteryPSV cm/sEDV cm/sComment +-----------------+--------+--------+-------+ Proximal            89      12           +-----------------+--------+--------+-------+ Mid                 85      12           +-----------------+--------+--------+-------+ Distal              47      12           +-----------------+--------+--------+-------+  +------------+--------+--------+--+-----------+--------+--------+---+ Right KidneyPSV cm/sEDV cm/sRILeft KidneyPSV cm/sEDV cm/sRI  +------------+--------+--------+--+-----------+--------+--------+---+ Upper Pole                    Upper Pole                     +------------+--------+--------+--+-----------+--------+--------+---+ Mid                           Mid                            +------------+--------+--------+--+-----------+--------+--------+---+ Lower Pole                    Lower Pole                     +------------+--------+--------+--+-----------+--------+--------+---+ Hilar                         Hilar                          +------------+--------+--------+--+-----------+--------+--------+---+  +------------------+----+------------------+-----+ Right Kidney          Left Kidney             +------------------+----+------------------+-----+ RAR                   RAR                     +------------------+----+------------------+-----+ RAR (manual)      .75 RAR (manual)      1.06  +------------------+----+------------------+-----+ Cortex  Cortex                   +------------------+----+------------------+-----+ Cortex thickness      Corex thickness         +------------------+----+------------------+-----+ Kidney length (cm)7.01Kidney length (cm)11.06 +------------------+----+------------------+-----+    Summary: Largest Aortic Diameter: 2.1 cm   Renal:   Right: Abnormal size for the right kidney. 1-59% stenosis of the        right renal artery. RRV flow present. Normal right Resisitive        Index. Left:  1-59% stenosis of the left renal artery. LRV flow present.        Normal size of left kidney. Normal left Resistive Index.   *See table(s) above for measurements and observations.   Diagnosing physician: Leotis Pain MD   Electronically signed by Leotis Pain MD on 02/12/2022 at 9:40:14 AM.       Final   VAS US CAROTID Carotid Arterial Duplex Study  Patient Name:  Tamara Finley  Date of Exam:   02/07/2022 Medical Rec #: [id number]       Accession #:    [id number] Date of Birth: 11/29/1940      Patient Gender: F Patient Age:   54 years Exam Location:  [facility] Procedure:      VAS US CAROTID Referring Phys: Leotis Pain  --------------------------------------------------------------------------------   Indications:       Carotid artery disease. Other Factors:     04/17: Lt Renal Stent and Aorta Bi Iliac BPG.                      05/17: Carotid/Renal Stent. Comparison Study:  07/02/2021  Performing Technologist: Almira Coaster RVS    Examination Guidelines: A complete evaluation includes B-mode imaging, spectral Doppler, color Doppler, and power Doppler as needed of all accessible portions of each vessel. Bilateral testing is considered an integral part of a complete examination. Limited examinations for reoccurring indications may be performed as noted.    Right Carotid Findings: +----------+--------+--------+--------+------------------+--------+           PSV cm/sEDV cm/sStenosisPlaque  DescriptionComments +----------+--------+--------+--------+------------------+--------+ CCA Prox  106     19                                         +----------+--------+--------+--------+------------------+--------+ CCA Mid   75      17                                         +----------+--------+--------+--------+------------------+--------+ CCA Distal102     18                                         +----------+--------+--------+--------+------------------+--------+ ICA Prox  209     44                                         +----------+--------+--------+--------+------------------+--------+ ICA Mid   158     30                                         +----------+--------+--------+--------+------------------+--------+  ICA Distal122     25                                         +----------+--------+--------+--------+------------------+--------+ ECA       266     24                                         +----------+--------+--------+--------+------------------+--------+  +----------+--------+-------+--------+-------------------+           PSV cm/sEDV cmsDescribeArm Pressure (mmHG) +----------+--------+-------+--------+-------------------+ [id number]      0                                  +----------+--------+-------+--------+-------------------+  +---------+--------+--+--------+--+ VertebralPSV cm/s94EDV cm/s20 +---------+--------+--+--------+--+     Left Carotid Findings: +----------+--------+--------+--------+------------------+--------+           PSV cm/sEDV cm/sStenosisPlaque DescriptionComments +----------+--------+--------+--------+------------------+--------+ CCA Prox  69      23                                         +----------+--------+--------+--------+------------------+--------+ CCA Mid   67      22                                          +----------+--------+--------+--------+------------------+--------+ CCA Distal63      16                                         +----------+--------+--------+--------+------------------+--------+ ICA Prox  118     31                                         +----------+--------+--------+--------+------------------+--------+ ICA Mid   48      7                                          +----------+--------+--------+--------+------------------+--------+ ICA Distal107     29                                         +----------+--------+--------+--------+------------------+--------+ ECA       166     9                                          +----------+--------+--------+--------+------------------+--------+  +----------+--------+--------+--------+-------------------+           PSV cm/sEDV cm/sDescribeArm Pressure (mmHG) +----------+--------+--------+--------+-------------------+ [id number]                                         +----------+--------+--------+--------+-------------------+  +---------+--------+--+--------+--+  VertebralPSV cm/s66EDV cm/s14 +---------+--------+--+--------+--+        Summary: Right Carotid: Velocities in the right ICA are consistent with a 40-59%                stenosis.  Left Carotid: Velocities in the left ICA are consistent with a 40-59% stenosis.  Vertebrals:  Bilateral vertebral arteries demonstrate antegrade flow. Subclavians: Normal flow hemodynamics were seen in bilateral subclavian              arteries.  *See table(s) above for measurements and observations.    Electronically signed by Leotis Pain MD on 02/12/2022 at 9:39:49 AM.      Final    Note: Reviewed        Physical Exam  General appearance: Well nourished, well developed, and well hydrated. In no apparent acute distress Mental status: Alert, oriented x 3 (person, place, & time)       Respiratory: No evidence of acute  respiratory distress Eyes: PERLA Vitals: Pulse 60   Temp (!) 97.3 F (36.3 C)   Resp 16   Ht _0  (1.626 m)   Wt 175 lb (79.4 kg)   SpO2 97%   BMI 30.04 kg/m  BMI: Estimated body mass index is 30.04 kg/m as calculated from the following:   Height as of this encounter: _1  (1.626 m).   Weight as of this encounter: 175 lb (79.4 kg). Ideal: Ideal body weight: 54.7 kg (120 lb 9.5 oz) Adjusted ideal body weight: 64.6 kg (142 lb 5.7 oz)  Chronic low back pain  Assessment   Status Diagnosis  Controlled Controlled Controlled 1. Chronic pain syndrome   2. Lumbar spondylosis   3. Lumbar facet arthropathy (L4/L5 and L5/S1)   4. Lumbar radiculopathy   5. Lumbar degenerative disc disease   6. DDD (degenerative disc disease), cervical   7. Chronic SI joint pain   8. Cervicalgia        Plan of Care    Tamara Finley has a current medication list which includes the following long-term medication(s): albuterol, azelastine, calcium 600+d plus minerals, carvedilol, furosemide, gabapentin, levothyroxine, levothyroxine, mirtazapine, montelukast, and pantoprazole.  Pharmacotherapy (Medications Ordered): Meds ordered this encounter  Medications   HYDROcodone-acetaminophen (NORCO/VICODIN) 5-325 MG tablet    Sig: Take 1 tablet by mouth 2 (two) times daily as needed for moderate pain.    Dispense:  60 tablet    Refill:  0   HYDROcodone-acetaminophen (NORCO/VICODIN) 5-325 MG tablet    Sig: Take 1 tablet by mouth 2 (two) times daily as needed for moderate pain.    Dispense:  60 tablet    Refill:  0   HYDROcodone-acetaminophen (NORCO/VICODIN) 5-325 MG tablet    Sig: Take 1 tablet by mouth 2 (two) times daily as needed for moderate pain.    Dispense:  60 tablet    Refill:  0   Orders Placed This Encounter  Procedures   ToxASSURE Select 13 (MW), Urine    Volume: 30 ml(s). Minimum 3 ml of urine is needed. Document temperature of fresh sample. Indications: Long term (current) use  of opiate analgesic [phone])    Order Specific Question:   Release to patient    Answer:   Immediate     Follow-up plan:   Return in about 15 weeks (around 11/04/2022) for Medication Management, in person.     consider RFA in future, Sprint PNS of medial branch at L4          Recent  Visits Date Type Provider Dept  05/01/22 Office Visit Gillis Santa, MD [facility]  Showing recent visits within past 90 days and meeting all other requirements Today's Visits Date Type Provider Dept  07/22/22 Office Visit Gillis Santa, MD [facility]  Showing today's visits and meeting all other requirements Future Appointments No visits were found meeting these conditions. Showing future appointments within next 90 days and meeting all other requirements  I discussed the assessment and treatment plan with the patient. The patient was provided an opportunity to ask questions and all were answered. The patient agreed with the plan and demonstrated an understanding of the instructions.  Patient advised to call back or seek an in-person evaluation if the symptoms or condition worsens.  Duration of encounter: 63mnutes.  Note by: BGillis Santa MD Date: 07/22/2022; Time: 2:43 PM

## 2022-09-04 DIAGNOSIS — J42 Unspecified chronic bronchitis: Secondary | ICD-10-CM | POA: Diagnosis not present

## 2022-09-04 DIAGNOSIS — R7303 Prediabetes: Secondary | ICD-10-CM | POA: Diagnosis not present

## 2022-09-04 DIAGNOSIS — Z853 Personal history of malignant neoplasm of breast: Secondary | ICD-10-CM | POA: Diagnosis not present

## 2022-09-04 DIAGNOSIS — N2581 Secondary hyperparathyroidism of renal origin: Secondary | ICD-10-CM | POA: Diagnosis not present

## 2022-09-04 DIAGNOSIS — I129 Hypertensive chronic kidney disease with stage 1 through stage 4 chronic kidney disease, or unspecified chronic kidney disease: Secondary | ICD-10-CM | POA: Diagnosis not present

## 2022-09-04 DIAGNOSIS — E039 Hypothyroidism, unspecified: Secondary | ICD-10-CM | POA: Diagnosis not present

## 2022-09-04 DIAGNOSIS — I739 Peripheral vascular disease, unspecified: Secondary | ICD-10-CM | POA: Diagnosis not present

## 2022-09-04 DIAGNOSIS — I771 Stricture of artery: Secondary | ICD-10-CM | POA: Diagnosis not present

## 2022-09-04 DIAGNOSIS — F418 Other specified anxiety disorders: Secondary | ICD-10-CM | POA: Diagnosis not present

## 2022-09-05 ENCOUNTER — Other Ambulatory Visit: Payer: Self-pay | Admitting: Internal Medicine

## 2022-09-05 DIAGNOSIS — F3342 Major depressive disorder, recurrent, in full remission: Secondary | ICD-10-CM

## 2022-09-05 DIAGNOSIS — R251 Tremor, unspecified: Secondary | ICD-10-CM

## 2022-09-08 DIAGNOSIS — N1832 Chronic kidney disease, stage 3b: Secondary | ICD-10-CM | POA: Diagnosis not present

## 2022-09-08 DIAGNOSIS — D631 Anemia in chronic kidney disease: Secondary | ICD-10-CM | POA: Diagnosis not present

## 2022-09-08 DIAGNOSIS — R809 Proteinuria, unspecified: Secondary | ICD-10-CM | POA: Diagnosis not present

## 2022-09-08 DIAGNOSIS — I701 Atherosclerosis of renal artery: Secondary | ICD-10-CM | POA: Diagnosis not present

## 2022-09-16 ENCOUNTER — Ambulatory Visit
Admission: RE | Admit: 2022-09-16 | Discharge: 2022-09-16 | Disposition: A | Discharge status: Home / Self Care | Payer: Medicare HMO | Source: Ambulatory Visit | Attending: Internal Medicine | Admitting: Internal Medicine

## 2022-09-16 DIAGNOSIS — F3342 Major depressive disorder, recurrent, in full remission: Secondary | ICD-10-CM

## 2022-09-16 DIAGNOSIS — R251 Tremor, unspecified: Secondary | ICD-10-CM | POA: Insufficient documentation

## 2022-09-16 DIAGNOSIS — R4182 Altered mental status, unspecified: Secondary | ICD-10-CM | POA: Diagnosis not present

## 2022-10-30 ENCOUNTER — Ambulatory Visit
Payer: Medicare HMO | Attending: Student in an Organized Health Care Education/Training Program | Admitting: Student in an Organized Health Care Education/Training Program

## 2022-10-30 ENCOUNTER — Encounter: Payer: Self-pay | Admitting: Student in an Organized Health Care Education/Training Program

## 2022-10-30 VITALS — BP 167/79 | HR 67 | Temp 97.5°F | Ht 63.0 in | Wt 169.0 lb

## 2022-10-30 DIAGNOSIS — M533 Sacrococcygeal disorders, not elsewhere classified: Secondary | ICD-10-CM | POA: Diagnosis not present

## 2022-10-30 DIAGNOSIS — M5416 Radiculopathy, lumbar region: Secondary | ICD-10-CM | POA: Diagnosis not present

## 2022-10-30 DIAGNOSIS — M5136 Other intervertebral disc degeneration, lumbar region: Secondary | ICD-10-CM | POA: Insufficient documentation

## 2022-10-30 DIAGNOSIS — G894 Chronic pain syndrome: Secondary | ICD-10-CM | POA: Diagnosis not present

## 2022-10-30 DIAGNOSIS — M47816 Spondylosis without myelopathy or radiculopathy, lumbar region: Secondary | ICD-10-CM | POA: Diagnosis not present

## 2022-10-30 DIAGNOSIS — G8929 Other chronic pain: Secondary | ICD-10-CM | POA: Diagnosis not present

## 2022-10-30 MED ORDER — HYDROCODONE-ACETAMINOPHEN 5-325 MG PO TABS: 1.0000 | ORAL_TABLET | Freq: Two times a day (BID) | ORAL | 0 refills | Status: DC | PRN

## 2022-10-30 MED ORDER — GABAPENTIN 300 MG PO CAPS: 600.0000 mg | ORAL_CAPSULE | Freq: Every day | ORAL | 2 refills | Status: AC

## 2022-10-30 MED ORDER — HYDROCODONE-ACETAMINOPHEN 5-325 MG PO TABS: 1.0000 | ORAL_TABLET | Freq: Two times a day (BID) | ORAL | 0 refills | Status: AC | PRN

## 2022-10-30 NOTE — Progress Notes (Signed)
PROVIDER NOTE: Information contained herein reflects review and annotations entered in association with encounter. Interpretation of such information and data should be left to medically-trained personnel. Information provided to patient can be located elsewhere in the medical record under "Patient Instructions". Document created using STT-dictation technology, any transcriptional errors that may result from process are unintentional.    Patient: Tamara Finley  Service Category: E/M  Provider: Gillis Santa, MD  DOB: May 14, 1941  DOS: 10/30/2022  Specialty: Interventional Pain Management  MRN: [id number]  Setting: Ambulatory outpatient  PCP: Adin Hector, MD  Type: Established Patient    Referring Provider: Adin Hector, MD  Location: Office  Delivery: Face-to-face     HPI  Tamara Finley, a 82 y.o. year old female, is here today because of her Lumbar spondylosis [M47.816]. Tamara Finley primary complain today is Back Pain (Lower right)  Last encounter: My last encounter with her was on 07/22/22  Pertinent problems: Tamara Finley has Low back pain radiating to both legs; Peripheral arterial disease ([place]); Right hip pain; Chronic SI joint pain; Lumbar facet arthropathy (L4/L5 and L5/S1); Controlled substance agreement signed; Lumbar spondylosis; and Lumbar radiculopathy on their pertinent problem list. Pain Assessment: Severity of Chronic pain is reported as a 5 /10. Location: Back Right/through right hip down right leg to ankle. Onset: More than a month ago. Quality: Aching, Numbness. Timing: Constant. Modifying factor(s): meds, sitting. Vitals:  height is '5\' 3"'$  (1.6 m) and weight is 169 lb (76.7 kg). Her temporal temperature is 97.5 F (36.4 C) (abnormal). Her blood pressure is 167/79 (abnormal) and her pulse is 67. Her oxygen saturation is 94%.   Reason for encounter: medication management.   Patient presents for medication management, no significant change in her medical history. She  states that she is noticing more head tremors, CT scan was negative for stroke or mass lesion, has follow up with her PCP to discuss further She was also started on Lexapro by her PCP for depression.  This is in addition to being on Remeron.   Pharmacotherapy Assessment  Analgesic: Hydrocodone 5 mg two times daily prn, quantity 60/month    Monitoring: [place] PMP: PDMP reviewed during this encounter.       Pharmacotherapy: No side-effects or adverse reactions reported. Compliance: No problems identified. Effectiveness: Clinically acceptable.  Rise Patience, RN  10/30/2022  2:01 PM  Sign when Signing Visit Nursing Pain Medication Assessment:  Safety precautions to be maintained throughout the outpatient stay will include: orient to surroundings, keep bed in low position, maintain call bell within reach at all times, provide assistance with transfer out of bed and ambulation.  Medication Inspection Compliance: Pill count conducted under aseptic conditions, in front of the patient. Neither the pills nor the bottle was removed from the patient's sight at any time. Once count was completed pills were immediately returned to the patient in their original bottle.  Medication: Hydrocodone/APAP Pill/Patch Count:  29 of 60 pills remain Pill/Patch Appearance: Markings consistent with prescribed medication Bottle Appearance: Standard pharmacy container. Clearly labeled. Filled Date: 02 / 14 / 2024 Last Medication intake:  Today     UDS:  Summary  Date Value Ref Range Status  04/30/2021 Note  Final    Comment:    ==================================================================== [employer] [facility] 13 (MW) ==================================================================== Test                             Result  Flag       Units  Drug Present and Declared for Prescription Verification   Norhydrocodone                 223          EXPECTED   ng/mg creat    Norhydrocodone is an expected  metabolite of hydrocodone.  Drug Absent but Declared for Prescription Verification   Hydrocodone                    Not Detected UNEXPECTED ng/mg creat    Hydrocodone is almost always present in patients taking this drug    consistently. Absence of hydrocodone could be due to lapse of time    since the last dose or unusual pharmacokinetics (rapid metabolism).  ==================================================================== Test                      Result    Flag   Units      Ref Range   Creatinine              26               mg/dL      >=20 ==================================================================== Declared Medications:  The flagging and interpretation on this report are based on the  following declared medications.  Unexpected results may arise from  inaccuracies in the declared medications.   **Note: The testing scope of this panel includes these medications:   Hydrocodone (Norco)   **Note: The testing scope of this panel does not include the  following reported medications:   Acetaminophen (Tylenol)  Acetaminophen (Norco)  Albuterol (Ventolin HFA)  Anastrozole (Arimidex)  Aspirin  Atorvastatin (Lipitor)  Azelastine (Astelin)  Calcium  Carvedilol (Coreg)  Cyanocobalamin  Fluticasone (Flonase)  Furosemide (Lasix)  Gabapentin (Neurontin)  Hydralazine (Apresoline)  Levothyroxine (Synthroid)  Mirtazapine (Remeron)  Montelukast (Singulair)  Pantoprazole (Protonix)  Potassium (Klor-Con)  Vitamin D ==================================================================== For clinical consultation, please call [phone]. ====================================================================      ROS  Constitutional: Denies any fever or chills Gastrointestinal: No reported hemesis, hematochezia, vomiting, or acute GI distress Musculoskeletal:  low back pain Neurological: No reported episodes of acute onset apraxia, aphasia, dysarthria, agnosia, amnesia,  paralysis, loss of coordination, or loss of consciousness  Medication Review  Calcium 600+D Plus Minerals, DSS, HYDROcodone-acetaminophen, acetaminophen, albuterol, anastrozole, aspirin, atorvastatin, azelastine, carvedilol, cyanocobalamin, fluticasone, furosemide, gabapentin, hydrALAZINE, levothyroxine, mirtazapine, pantoprazole, and potassium chloride SA  History Review  Allergy: Tamara Finley is allergic to benadryl [diphenhydramine hcl (sleep)] and alprazolam. Drug: Tamara Finley  reports no history of drug use. Alcohol:  reports no history of alcohol use. Tobacco:  reports that she quit smoking about 9 years ago. Her smoking use included cigarettes. She has a 100.00 pack-year smoking history. She has never used smokeless tobacco. Social: Tamara Finley  reports that she quit smoking about 9 years ago. Her smoking use included cigarettes. She has a 100.00 pack-year smoking history. She has never used smokeless tobacco. She reports that she does not drink alcohol and does not use drugs. Medical:  has a past medical history of Abdominal aortic aneurysm (AAA) ([place]) (2000's), Age related entropion, unspecified laterality, Anxiety, Anxiety and depression, Bilateral renal artery stenosis ([place]), Breast calcification, left, Cancer ([place]) (10/20/2016), Carotid artery thrombosis, bilateral, Carotid stenosis, Chronic bronchitis (H[facility]), Chronic insomnia, COPD (chronic obstructive pulmonary disease) ([place]), Coronary artery disease, CRI (chronic renal insufficiency), stage 3 (moderate) ([place]), Depression, Diabetes mellitus without complication ([place]), GERD (gastroesophageal reflux  disease), Heart palpitations, History of AAA (abdominal aortic aneurysm) repair, History of colon polyps, History of TIA (transient ischemic attack), Hypercholesterolemia, Hypertension, Hypothyroidism, Migraine headache, Osteoarthritis, Osteoporosis, Peripheral arterial disease ([place]), Pulmonary scarring, Renal disorder, SOB (shortness of breath),  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 Surgical: Tamara Finley  has a past surgical history that includes Cardiac surgery; Abdominal aortic aneurysm repair; Cardiac catheterization (N/A, 12/05/2015); Cardiac catheterization (Left, 12/31/2015); Breast excisional biopsy (Left, 2010); Breast excisional biopsy (Left, 07/01/2006); Breast biopsy (Left, 09/03/2016); Breast biopsy (Left, 09/25/2016); Bronchoscopy; CAROTID PTA/STENT INTERVENTION; Colonoscopy; Eye surgery; Laminectomy (07/25/2013); Back surgery; Mediastinoscopy; Mastectomy; Thoracoscopy (12/16/2016); and Colonoscopy with propofol (N/A, 06/15/2017). Family: family history includes CVA in her mother; Cancer in her brother; Coronary artery disease in her father; Dementia in her father; Diabetes in her brother; Epilepsy in her mother; Hypertension in her father and mother; Ovarian cancer in her sister; Stroke in her father and mother.  Laboratory Chemistry Profile   Renal Lab Results  Component Value Date   BUN 9 01/01/2016   CREATININE 1.03 (H) 01/01/2016   GFRAA >60 01/01/2016   GFRNONAA 53 (L) 01/01/2016    Hepatic Lab Results  Component Value Date   AST 28 10/17/2015   ALT 17 10/17/2015   ALBUMIN 3.7 10/17/2015   ALKPHOS 64 10/17/2015    Electrolytes Lab Results  Component Value Date   NA 139 01/01/2016   K 3.5 01/01/2016   CL 106 01/01/2016   CALCIUM 8.2 (L) 01/01/2016    Bone No results found for: "VD25OH", "VD125OH2TOT", "[id number]", "[id number]", "25OHVITD1", "25OHVITD2", "25OHVITD3", "TESTOFREE", "TESTOSTERONE"  Inflammation (CRP: Acute Phase) (ESR: Chronic Phase) Lab Results  Component Value Date   ESRSEDRATE 23 10/12/2015         Note: Above Lab results reviewed.  Recent Imaging Review  CT HEAD WO CONTRAST (5MM) CLINICAL DATA:  Altered mental status  EXAM: CT HEAD WITHOUT  CONTRAST  TECHNIQUE: Contiguous axial images were obtained from the base of the skull through the vertex without intravenous contrast.  RADIATION DOSE REDUCTION: This exam was performed according to the departmental dose-optimization program which includes automated exposure control, adjustment of the mA and/or kV according to patient size and/or use of iterative reconstruction technique.  COMPARISON:  None Available.  FINDINGS: Brain: No acute intracranial hemorrhage. No focal mass lesion. No CT evidence of acute infarction. No midline shift or mass effect. No hydrocephalus. Basilar cisterns are patent.  There are periventricular and subcortical white matter hypodensities. Generalized cortical atrophy.  Vascular: No hyperdense vessel or unexpected calcification.  Skull: Normal. Negative for fracture or focal lesion.  Sinuses/Orbits: Paranasal sinuses and mastoid air cells are clear. Orbits are clear.  Other: None.  IMPRESSION: 1. No acute intracranial findings. 2. Atrophy and white matter microvascular disease.  Electronically Signed   By: Suzy Bouchard M.D.   On: 09/16/2022 16:04  Note: Reviewed        Physical Exam  General appearance: Well nourished, well developed, and well hydrated. In no apparent acute distress Mental status: Alert, oriented x 3 (person, place, & time)       Respiratory: No evidence of acute respiratory distress Eyes: PERLA Vitals: BP (!) 167/79   Pulse 67   Temp (!) 97.5 F (36.4 C) (Temporal)   Ht '5\' 3"'$  (1.6 m)   Wt 169 lb (76.7 kg)   SpO2 94%   BMI 29.94 kg/m  BMI: Estimated body  mass index is 29.94 kg/m as calculated from the following:   Height as of this encounter: '5\' 3"'$  (1.6 m).   Weight as of this encounter: 169 lb (76.7 kg). Ideal: Ideal body weight: 52.4 kg (115 lb 8.3 oz) Adjusted ideal body weight: 62.1 kg (136 lb 14.6 oz)  Chronic low back pain  Assessment   Status Diagnosis  Controlled Controlled Controlled  1. Lumbar spondylosis   2. Lumbar facet arthropathy (L4/L5 and L5/S1)   3. Lumbar radiculopathy   4. Lumbar degenerative disc disease   5. Chronic SI joint pain   6. Chronic pain syndrome         Plan of Care    Tamara Finley has a current medication list which includes the following long-term medication(s): albuterol, azelastine, calcium 600+d plus minerals, carvedilol, furosemide, levothyroxine, levothyroxine, mirtazapine, pantoprazole, and gabapentin.  Pharmacotherapy (Medications Ordered): Meds ordered this encounter  Medications   HYDROcodone-acetaminophen (NORCO/VICODIN) 5-325 MG tablet    Sig: Take 1 tablet by mouth 2 (two) times daily as needed for moderate pain.    Dispense:  60 tablet    Refill:  0   HYDROcodone-acetaminophen (NORCO/VICODIN) 5-325 MG tablet    Sig: Take 1 tablet by mouth 2 (two) times daily as needed for moderate pain.    Dispense:  60 tablet    Refill:  0   HYDROcodone-acetaminophen (NORCO/VICODIN) 5-325 MG tablet    Sig: Take 1 tablet by mouth 2 (two) times daily as needed for moderate pain.    Dispense:  60 tablet    Refill:  0   gabapentin (NEURONTIN) 300 MG capsule    Sig: Take 2 capsules (600 mg total) by mouth at bedtime.    Dispense:  90 capsule    Refill:  2   Offered patient repeat lumbar facet medial branch nerve blocks that she has done previously in 2019, she states that she will think about this further and let us know if she would like to proceed.  No orders of the defined types were placed in this encounter.    Follow-up plan:   Return in about 3 months (around 01/28/2023) for Medication Management, in person.     consider RFA in future, Sprint PNS of medial branch at L4          Recent Visits No visits were found meeting these conditions. Showing recent visits within past 90 days and meeting all other requirements Today's Visits Date Type Provider Dept  10/30/22 Office Visit Gillis Santa, MD [facility]   Showing today's visits and meeting all other requirements Future Appointments Date Type Provider Dept  01/20/23 Appointment Gillis Santa, MD [facility]  Showing future appointments within next 90 days and meeting all other requirements  I discussed the assessment and treatment plan with the patient. The patient was provided an opportunity to ask questions and all were answered. The patient agreed with the plan and demonstrated an understanding of the instructions.  Patient advised to call back or seek an in-person evaluation if the symptoms or condition worsens.  Duration of encounter: 108mnutes.  Note by: BGillis Santa MD Date: 10/30/2022; Time: 2:44 PM

## 2022-10-30 NOTE — Progress Notes (Signed)
Nursing Pain Medication Assessment:  Safety precautions to be maintained throughout the outpatient stay will include: orient to surroundings, keep bed in low position, maintain call bell within reach at all times, provide assistance with transfer out of bed and ambulation.  Medication Inspection Compliance: Pill count conducted under aseptic conditions, in front of the patient. Neither the pills nor the bottle was removed from the patient's sight at any time. Once count was completed pills were immediately returned to the patient in their original bottle.  Medication: Hydrocodone/APAP Pill/Patch Count:  29 of 60 pills remain Pill/Patch Appearance: Markings consistent with prescribed medication Bottle Appearance: Standard pharmacy container. Clearly labeled. Filled Date: 02 / 14 / 2024 Last Medication intake:  Today

## 2022-11-03 DIAGNOSIS — I739 Peripheral vascular disease, unspecified: Secondary | ICD-10-CM | POA: Diagnosis not present

## 2022-11-03 DIAGNOSIS — J4489 Other specified chronic obstructive pulmonary disease: Secondary | ICD-10-CM | POA: Diagnosis not present

## 2022-11-03 DIAGNOSIS — I771 Stricture of artery: Secondary | ICD-10-CM | POA: Diagnosis not present

## 2022-11-03 DIAGNOSIS — E213 Hyperparathyroidism, unspecified: Secondary | ICD-10-CM | POA: Diagnosis not present

## 2022-11-03 DIAGNOSIS — F32A Depression, unspecified: Secondary | ICD-10-CM | POA: Diagnosis not present

## 2022-11-03 DIAGNOSIS — R7303 Prediabetes: Secondary | ICD-10-CM | POA: Diagnosis not present

## 2022-11-03 DIAGNOSIS — N189 Chronic kidney disease, unspecified: Secondary | ICD-10-CM | POA: Diagnosis not present

## 2022-11-03 DIAGNOSIS — G319 Degenerative disease of nervous system, unspecified: Secondary | ICD-10-CM | POA: Diagnosis not present

## 2022-11-03 DIAGNOSIS — C50919 Malignant neoplasm of unspecified site of unspecified female breast: Secondary | ICD-10-CM | POA: Diagnosis not present

## 2022-12-17 ENCOUNTER — Emergency Department: Payer: Medicare HMO

## 2022-12-17 ENCOUNTER — Encounter: Payer: Self-pay | Admitting: Emergency Medicine

## 2022-12-17 ENCOUNTER — Inpatient Hospital Stay
Admission: EM | Admit: 2022-12-17 | Discharge: 2022-12-21 | DRG: 392 | Disposition: A | Discharge status: Home Health | Payer: Medicare HMO | Attending: Internal Medicine | Admitting: Internal Medicine

## 2022-12-17 DIAGNOSIS — Z853 Personal history of malignant neoplasm of breast: Secondary | ICD-10-CM

## 2022-12-17 DIAGNOSIS — R531 Weakness: Principal | ICD-10-CM | POA: Diagnosis present

## 2022-12-17 DIAGNOSIS — E785 Hyperlipidemia, unspecified: Secondary | ICD-10-CM | POA: Diagnosis not present

## 2022-12-17 DIAGNOSIS — Z8249 Family history of ischemic heart disease and other diseases of the circulatory system: Secondary | ICD-10-CM

## 2022-12-17 DIAGNOSIS — F5104 Psychophysiologic insomnia: Secondary | ICD-10-CM | POA: Diagnosis present

## 2022-12-17 DIAGNOSIS — G894 Chronic pain syndrome: Secondary | ICD-10-CM | POA: Diagnosis present

## 2022-12-17 DIAGNOSIS — Z79811 Long term (current) use of aromatase inhibitors: Secondary | ICD-10-CM

## 2022-12-17 DIAGNOSIS — K529 Noninfective gastroenteritis and colitis, unspecified: Secondary | ICD-10-CM | POA: Diagnosis not present

## 2022-12-17 DIAGNOSIS — R197 Diarrhea, unspecified: Secondary | ICD-10-CM | POA: Diagnosis not present

## 2022-12-17 DIAGNOSIS — E039 Hypothyroidism, unspecified: Secondary | ICD-10-CM | POA: Diagnosis present

## 2022-12-17 DIAGNOSIS — N1832 Chronic kidney disease, stage 3b: Secondary | ICD-10-CM | POA: Diagnosis not present

## 2022-12-17 DIAGNOSIS — Z823 Family history of stroke: Secondary | ICD-10-CM

## 2022-12-17 DIAGNOSIS — I2699 Other pulmonary embolism without acute cor pulmonale: Secondary | ICD-10-CM | POA: Diagnosis not present

## 2022-12-17 DIAGNOSIS — I10 Essential (primary) hypertension: Secondary | ICD-10-CM | POA: Diagnosis not present

## 2022-12-17 DIAGNOSIS — Z8679 Personal history of other diseases of the circulatory system: Secondary | ICD-10-CM

## 2022-12-17 DIAGNOSIS — J90 Pleural effusion, not elsewhere classified: Secondary | ICD-10-CM | POA: Diagnosis not present

## 2022-12-17 DIAGNOSIS — N390 Urinary tract infection, site not specified: Secondary | ICD-10-CM

## 2022-12-17 DIAGNOSIS — F32A Depression, unspecified: Secondary | ICD-10-CM | POA: Diagnosis present

## 2022-12-17 DIAGNOSIS — A419 Sepsis, unspecified organism: Secondary | ICD-10-CM | POA: Diagnosis not present

## 2022-12-17 DIAGNOSIS — Z79890 Hormone replacement therapy (postmenopausal): Secondary | ICD-10-CM | POA: Diagnosis not present

## 2022-12-17 DIAGNOSIS — R0902 Hypoxemia: Secondary | ICD-10-CM | POA: Diagnosis present

## 2022-12-17 DIAGNOSIS — Z86010 Personal history of colonic polyps: Secondary | ICD-10-CM

## 2022-12-17 DIAGNOSIS — E1122 Type 2 diabetes mellitus with diabetic chronic kidney disease: Secondary | ICD-10-CM | POA: Diagnosis present

## 2022-12-17 DIAGNOSIS — Z888 Allergy status to other drugs, medicaments and biological substances status: Secondary | ICD-10-CM

## 2022-12-17 DIAGNOSIS — I129 Hypertensive chronic kidney disease with stage 1 through stage 4 chronic kidney disease, or unspecified chronic kidney disease: Secondary | ICD-10-CM | POA: Diagnosis present

## 2022-12-17 DIAGNOSIS — I2510 Atherosclerotic heart disease of native coronary artery without angina pectoris: Secondary | ICD-10-CM | POA: Diagnosis present

## 2022-12-17 DIAGNOSIS — Z87891 Personal history of nicotine dependence: Secondary | ICD-10-CM

## 2022-12-17 DIAGNOSIS — Z7982 Long term (current) use of aspirin: Secondary | ICD-10-CM

## 2022-12-17 DIAGNOSIS — Z1152 Encounter for screening for COVID-19: Secondary | ICD-10-CM | POA: Diagnosis not present

## 2022-12-17 DIAGNOSIS — M5030 Other cervical disc degeneration, unspecified cervical region: Secondary | ICD-10-CM | POA: Diagnosis present

## 2022-12-17 DIAGNOSIS — N281 Cyst of kidney, acquired: Secondary | ICD-10-CM | POA: Diagnosis not present

## 2022-12-17 DIAGNOSIS — K219 Gastro-esophageal reflux disease without esophagitis: Secondary | ICD-10-CM | POA: Diagnosis present

## 2022-12-17 DIAGNOSIS — J439 Emphysema, unspecified: Secondary | ICD-10-CM | POA: Diagnosis not present

## 2022-12-17 DIAGNOSIS — M810 Age-related osteoporosis without current pathological fracture: Secondary | ICD-10-CM | POA: Diagnosis present

## 2022-12-17 DIAGNOSIS — E7800 Pure hypercholesterolemia, unspecified: Secondary | ICD-10-CM | POA: Diagnosis present

## 2022-12-17 DIAGNOSIS — E1151 Type 2 diabetes mellitus with diabetic peripheral angiopathy without gangrene: Secondary | ICD-10-CM | POA: Diagnosis not present

## 2022-12-17 DIAGNOSIS — R4182 Altered mental status, unspecified: Secondary | ICD-10-CM | POA: Diagnosis not present

## 2022-12-17 DIAGNOSIS — B9620 Unspecified Escherichia coli [E. coli] as the cause of diseases classified elsewhere: Secondary | ICD-10-CM | POA: Diagnosis not present

## 2022-12-17 DIAGNOSIS — Z9012 Acquired absence of left breast and nipple: Secondary | ICD-10-CM

## 2022-12-17 DIAGNOSIS — Z8673 Personal history of transient ischemic attack (TIA), and cerebral infarction without residual deficits: Secondary | ICD-10-CM

## 2022-12-17 DIAGNOSIS — K3189 Other diseases of stomach and duodenum: Secondary | ICD-10-CM | POA: Diagnosis not present

## 2022-12-17 DIAGNOSIS — Z79899 Other long term (current) drug therapy: Secondary | ICD-10-CM

## 2022-12-17 DIAGNOSIS — R42 Dizziness and giddiness: Secondary | ICD-10-CM | POA: Diagnosis not present

## 2022-12-17 LAB — COMPREHENSIVE METABOLIC PANEL
ALT: 14 U/L (ref 0–44)
AST: 31 U/L (ref 15–41)
Albumin: 3.4 g/dL — ABNORMAL LOW (ref 3.5–5.0)
Alkaline Phosphatase: 48 U/L (ref 38–126)
Anion gap: 8 (ref 5–15)
BUN: 21 mg/dL (ref 8–23)
CO2: 24 mmol/L (ref 22–32)
Calcium: 8.2 mg/dL — ABNORMAL LOW (ref 8.9–10.3)
Chloride: 102 mmol/L (ref 98–111)
Creatinine, Ser: 1.29 mg/dL — ABNORMAL HIGH (ref 0.44–1.00)
GFR, Estimated: 42 mL/min — ABNORMAL LOW (ref >60)
Glucose, Bld: 98 mg/dL (ref 70–99)
Potassium: 4.2 mmol/L (ref 3.5–5.1)
Sodium: 134 mmol/L — ABNORMAL LOW (ref 135–145)
Total Bilirubin: 0.9 mg/dL (ref 0.3–1.2)
Total Protein: 6.3 g/dL — ABNORMAL LOW (ref 6.5–8.1)

## 2022-12-17 LAB — RESP PANEL BY RT-PCR (RSV, FLU A&B, COVID)  RVPGX2
Influenza A by PCR: NEGATIVE
Influenza B by PCR: NEGATIVE
Resp Syncytial Virus by PCR: NEGATIVE
SARS Coronavirus 2 by RT PCR: NEGATIVE

## 2022-12-17 LAB — CBC WITH DIFFERENTIAL/PLATELET
Abs Immature Granulocytes: 0.03 10*3/uL (ref 0.00–0.07)
Basophils Absolute: 0 10*3/uL (ref 0.0–0.1)
Basophils Relative: 0 %
Eosinophils Absolute: 0 10*3/uL (ref 0.0–0.5)
Eosinophils Relative: 0 %
HCT: 35.7 % — ABNORMAL LOW (ref 36.0–46.0)
Hemoglobin: 11.6 g/dL — ABNORMAL LOW (ref 12.0–15.0)
Immature Granulocytes: 0 %
Lymphocytes Relative: 7 %
Lymphs Abs: 0.7 10*3/uL (ref 0.7–4.0)
MCH: 30.8 pg (ref 26.0–34.0)
MCHC: 32.5 g/dL (ref 30.0–36.0)
MCV: 94.7 fL (ref 80.0–100.0)
Monocytes Absolute: 0.7 10*3/uL (ref 0.1–1.0)
Monocytes Relative: 6 %
Neutro Abs: 9.8 10*3/uL — ABNORMAL HIGH (ref 1.7–7.7)
Neutrophils Relative %: 87 %
Platelets: 142 10*3/uL — ABNORMAL LOW (ref 150–400)
RBC: 3.77 MIL/uL — ABNORMAL LOW (ref 3.87–5.11)
RDW: 12.7 % (ref 11.5–15.5)
WBC: 11.3 10*3/uL — ABNORMAL HIGH (ref 4.0–10.5)
nRBC: 0 % (ref 0.0–0.2)

## 2022-12-17 LAB — TROPONIN I (HIGH SENSITIVITY): Troponin I (High Sensitivity): 6 ng/L (ref <18)

## 2022-12-17 MED ORDER — ONDANSETRON HCL 4 MG/2ML IJ SOLN
4.0000 mg | Freq: Once | INTRAMUSCULAR | Status: AC
  Administered 2022-12-17: 4 mg via INTRAVENOUS
  Filled 2022-12-17: qty 2

## 2022-12-17 MED ORDER — SODIUM CHLORIDE 0.9 % IV BOLUS
1000.0000 mL | Freq: Once | INTRAVENOUS | Status: AC
  Administered 2022-12-17: 1000 mL via INTRAVENOUS

## 2022-12-17 MED ORDER — MORPHINE SULFATE (PF) 2 MG/ML IV SOLN
2.0000 mg | Freq: Once | INTRAVENOUS | Status: AC
  Administered 2022-12-17: 2 mg via INTRAVENOUS
  Filled 2022-12-17: qty 1

## 2022-12-17 NOTE — ED Triage Notes (Signed)
EMS brings pt in from home for c/o weakness today with dizziness

## 2022-12-17 NOTE — ED Provider Notes (Signed)
[facility] Provider Note    Event Date/Time   First MD Initiated Contact with Patient 12/17/22 2322     (approximate)   History   Weakness   HPI  Tamara Finley is a 82 y.o. female brought to the ED from home via EMS with a chief complaint of generalized weakness, chest pain, dizziness, nausea and diarrhea x 1 day.  Daughter reports patient appears weaker than usual today.  Patient denies fever/chills, cough, shortness of breath, abdominal pain, vomiting, dysuria.     Past Medical History   Past Medical History:  Diagnosis Date   Abdominal aortic aneurysm (AAA) 2000's   Age related entropion, unspecified laterality    Anxiety    Anxiety and depression    Bilateral renal artery stenosis    Breast calcification, left    Cancer 10/20/2016   breast cancer   Carotid artery thrombosis, bilateral    Carotid stenosis    Chronic bronchitis    Chronic insomnia    COPD (chronic obstructive pulmonary disease)    Coronary artery disease    CRI (chronic renal insufficiency), stage 3 (moderate)    Depression    Diabetes mellitus without complication    GERD (gastroesophageal reflux disease)    Heart palpitations    History of AAA (abdominal aortic aneurysm) repair    History of colon polyps    History of TIA (transient ischemic attack)    Hypercholesterolemia    Hypertension    Hypothyroidism    Migraine headache    Osteoarthritis    Osteoporosis    Peripheral arterial disease    Pulmonary scarring    Renal disorder    SOB (shortness of breath)    Stenosis of left subclavian artery    Stroke    Thyroid disease    TIA (transient ischemic attack)    Type 2 diabetes mellitus with vascular disease      Active Problem List   Patient Active Problem List   Diagnosis Date Noted   Piriformis syndrome of both sides 04/30/2021   Bilateral neck pain 02/12/2021   DDD (degenerative disc disease), cervical 02/12/2021   Chronic pain syndrome  02/12/2021   Hyperparathyroidism due to renal insufficiency 12/22/2020   Swelling of limb 06/01/2020   Acute kidney failure 08/04/2019   Benign essential hypertension 08/04/2019   Lumbar spondylosis 07/21/2019   Lumbar radiculopathy 07/21/2019   Colitis, nonspecific 06/03/2019   Hematochezia 06/03/2019   Age related entropion 04/15/2019   B12 deficiency 03/15/2019   Lumbar facet arthropathy (L4/L5 and L5/S1) 09/28/2018   Controlled substance agreement signed 09/28/2018   Impaired glucose tolerance 12/07/2017   Anemia in stage 3 chronic kidney disease 06/01/2017   Renal artery stenosis 04/10/2017   Mediastinal adenopathy 11/25/2016   Lung nodule 11/07/2016   Primary cancer of upper inner quadrant of left female breast 10/20/2016   Hyperlipidemia, unspecified 10/09/2016   Migraine headache 10/09/2016   Osteoarthritis 10/09/2016   ERRONEOUS ENCOUNTER--DISREGARD 10/09/2016   Left breast mass 08/15/2016   Elevated blood sugar 07/08/2016   Adenomatous colon polyp 05/27/2016   SOB (shortness of breath) 04/07/2016   Heart palpitations 04/01/2016   High risk medication use 02/11/2016   Carotid stenosis 12/31/2015   Malignant hypertension 12/06/2015   Renovascular hypertension, 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/2015   Carotid atherosclerosis, bilateral 10/25/2015   History of TIA (transient ischemic attack) 10/25/2015   TIA (transient ischemic attack) 10/17/2015   Stenosis of left subclavian artery 10/15/2015   Low back pain radiating  to both legs 08/21/2015   Chronic SI joint pain 08/21/2015   Perennial allergic rhinitis 08/14/2015   Primary osteoarthritis of right hip 10/11/2014   COPD (chronic obstructive pulmonary disease) 08/11/2014   CRI (chronic renal insufficiency), stage 3 (moderate) 08/11/2014   Breast calcification, left 04/06/2014   Right hip pain 03/14/2014   Anxiety and depression 02/08/2014   Chronic insomnia 02/08/2014   Osteoporosis, postmenopausal 02/08/2014   Peripheral arterial  disease 02/08/2014   Type 2 diabetes mellitus with vascular disease 02/08/2014   Primary localized osteoarthrosis, pelvic region and thigh 11/09/2013   GERD (gastroesophageal reflux disease) 07/18/2013   History of AAA (abdominal aortic aneurysm) repair 07/18/2013   Hypertension 07/18/2013   Hypothyroidism 07/18/2013   Recurrent major depressive disorder, in full remission 07/18/2013   Lumbar stenosis 06/01/2013     Past Surgical History   Past Surgical History:  Procedure Laterality Date   ABDOMINAL AORTIC ANEURYSM REPAIR     BACK SURGERY     BREAST BIOPSY Left 09/03/2016   benign   BREAST BIOPSY Left 09/25/2016   path pending   BREAST EXCISIONAL BIOPSY Left 2010   BREAST EXCISIONAL BIOPSY Left 07/01/2006   BRONCHOSCOPY     CARDIAC SURGERY     CAROTID PTA/STENT INTERVENTION     COLONOSCOPY     COLONOSCOPY WITH PROPOFOL N/A 06/15/2017   Procedure: COLONOSCOPY WITH PROPOFOL;  Surgeon: Christena Deem, MD;  Location: [facility] END[facility];  Service: Endoscopy;  Laterality: N/A;   EYE SURGERY     LAMINECTOMY  07/25/2013   MASTECTOMY     MEDIASTINOSCOPY     PERIPHERAL VASCULAR CATHETERIZATION N/A 12/05/2015   Procedure: Renal Angiography;  Surgeon: Renford Dills, MD;  Location: [facility] INVASIVE CV LAB;  Service: Cardiovascular;  Laterality: N/A;   PERIPHERAL VASCULAR CATHETERIZATION Left 12/31/2015   Procedure: Carotid PTA/Stent Intervention;  Surgeon: Annice Needy, MD;  Location: [facility] INVASIVE CV LAB;  Service: Cardiovascular;  Laterality: Left;   THORACOSCOPY  12/16/2016     Home Medications   Prior to Admission medications   Medication Sig Start Date End Date Taking? Authorizing Provider  acetaminophen (TYLENOL) 500 MG tablet Take 1,000 mg by mouth every 8 (eight) hours as needed for moderate pain.     [provider]  albuterol (PROVENTIL HFA;VENTOLIN HFA) 108 (90 BASE) MCG/ACT inhaler Inhale 2 puffs into the lungs every 6 (six) hours as needed for wheezing or  shortness of breath. Reported on 12/31/2015    [provider]  anastrozole (ARIMIDEX) 1 MG tablet Take 1 mg by mouth daily.  02/17/17   [provider]  aspirin 81 MG chewable tablet Chew 81 mg by mouth daily.    [provider]  atorvastatin (LIPITOR) 40 MG tablet Take 40 mg by mouth daily at 6 PM.  05/27/16   [provider]  azelastine (ASTELIN) 0.1 % nasal spray Place into both nostrils 2 (two) times daily. Use in each nostril as directed    [provider]  Calcium Carbonate-Vit D-Min (CALCIUM 600+D PLUS MINERALS) 600-400 MG-UNIT TABS Take by mouth.    [provider]  carvedilol (COREG) 6.25 MG tablet Take 6.25 mg by mouth 2 (two) times daily with a meal.    [provider]  Docusate Sodium (DSS) 100 MG CAPS Take by mouth.    [provider]  fluticasone (FLONASE) 50 MCG/ACT nasal spray Place 1-2 sprays into both nostrils daily as needed for rhinitis.    [provider]  furosemide (  LASIX) 40 MG tablet Take 1 tablet by mouth 1 day or 1 dose.    [provider]  gabapentin (NEURONTIN) 300 MG capsule Take 2 capsules (600 mg total) by mouth at bedtime. 10/30/22   Edward Jolly, MD  hydrALAZINE (APRESOLINE) 50 MG tablet Take 50 mg by mouth 3 (three) times daily.    [provider]  HYDROcodone-acetaminophen (NORCO/VICODIN) 5-325 MG tablet Take 1 tablet by mouth 2 (two) times daily as needed for moderate pain. 12/14/22 01/13/23  Edward Jolly, MD  HYDROcodone-acetaminophen (NORCO/VICODIN) 5-325 MG tablet Take 1 tablet by mouth 2 (two) times daily as needed for moderate pain. 01/13/23 02/12/23  Edward Jolly, MD  levothyroxine (SYNTHROID) 88 MCG tablet Take by mouth. 04/30/21   [provider]  levothyroxine (SYNTHROID, LEVOTHROID) 88 MCG tablet Take 88 mcg by mouth daily before breakfast.    [provider]  mirtazapine (REMERON) 15 MG tablet TAKE 1 TABLET BY MOUTH EVERY DAY AT NIGHT 01/09/18    [provider]  pantoprazole (PROTONIX) 40 MG tablet Take 40 mg by mouth daily.    [provider]  potassium chloride SA (K-DUR,KLOR-CON) 20 MEQ tablet Take 20 mEq by mouth as directed.    [provider]  vitamin B-12 (CYANOCOBALAMIN) 1000 MCG tablet Take 1 tablet by mouth 1 day or 1 dose.    [provider]     Allergies  Benadryl [diphenhydramine hcl (sleep)] and Alprazolam   Family History   Family History  Problem Relation Age of Onset   CVA Mother    Hypertension Mother    Stroke Mother    Epilepsy Mother    Dementia Father    Stroke Father    Coronary artery disease Father    Hypertension Father    Ovarian cancer Sister    Cancer Brother    Diabetes Brother      Physical Exam  Triage Vital Signs: ED Triage Vitals  Enc Vitals Group     BP 12/17/22 2208 (!) 137/58     Pulse Rate 12/17/22 2208 86     Resp 12/17/22 2208 18     Temp 12/17/22 2208 100.2 F (37.9 C)     Temp Source 12/17/22 2208 Oral     SpO2 12/17/22 2201 92 %     Weight 12/17/22 2208 190 lb (86.2 kg)     Height 12/17/22 2208 5\' 3"  (1.6 m)     Head Circumference --      Peak Flow --      Pain Score --      Pain Loc --      Pain Edu? --      Excl. in GC? --     Updated Vital Signs: BP (!) 128/50   Pulse 80   Temp 98.9 F (37.2 C) (Oral)   Resp 17   Ht 5\' 3"  (1.6 m)   Wt 86.2 kg   SpO2 100%   BMI 33.66 kg/m    General: Awake, no distress.  Mildly dry mucous membranes. CV:  RRR.  Good peripheral perfusion.  Resp:  Normal effort.  Diminished, otherwise CTAB.  Right anterior chest tender to palpation. Abd:  Nontender.  No distention.  Other:  No truncal vesicles.   ED Results / Procedures / Treatments  Labs (all labs ordered are listed, but only abnormal results are displayed) Labs Reviewed  CBC WITH DIFFERENTIAL/PLATELET - Abnormal; Notable for the following components:      Result Value   WBC 11.3 (*)  RBC 3.77 (*)    Hemoglobin 11.6  (*)    HCT 35.7 (*)    Platelets 142 (*)    Neutro Abs 9.8 (*)    All other components within normal limits  COMPREHENSIVE METABOLIC PANEL - Abnormal; Notable for the following components:   Sodium 134 (*)    Creatinine, Ser 1.29 (*)    Calcium 8.2 (*)    Total Protein 6.3 (*)    Albumin 3.4 (*)    GFR, Estimated 42 (*)    All other components within normal limits  URINALYSIS, ROUTINE W REFLEX MICROSCOPIC - Abnormal; Notable for the following components:   Color, Urine YELLOW (*)    APPearance HAZY (*)    Leukocytes,Ua MODERATE (*)    Bacteria, UA MANY (*)    All other components within normal limits  BRAIN NATRIURETIC PEPTIDE - Abnormal; Notable for the following components:   B Natriuretic Peptide 229.6 (*)    All other components within normal limits  RESP PANEL BY RT-PCR (RSV, FLU A&B, COVID)  RVPGX2  CULTURE, BLOOD (ROUTINE X 2)  CULTURE, BLOOD (ROUTINE X 2)  URINE CULTURE  C DIFFICILE QUICK SCREEN W PCR REFLEX    GASTROINTESTINAL PANEL BY PCR, STOOL (REPLACES STOOL CULTURE)  LACTIC ACID, PLASMA  PROCALCITONIN  LACTIC ACID, PLASMA  TROPONIN I (HIGH SENSITIVITY)  TROPONIN I (HIGH SENSITIVITY)     EKG  ED ECG REPORT I, Ember Gottwald J, the attending physician, personally viewed and interpreted this ECG.   Date: 12/17/2022  EKG Time: 2213  Rate: 82  Rhythm: normal sinus rhythm  Axis: LAD  Intervals:left anterior fascicular block  ST&T Change: Nonspecific    RADIOLOGY I have independently visualized and interpreted patient's x-ray and CT scans as well as noted the radiology interpretation:  Chest x-ray: Low lung volumes with bronchovascular crowding versus vascular congestion  CT Head: No ICH  CT Abdomen/Pelvis: Colitis  Official radiology report(s): CT Head Wo Contrast  Result Date: 12/18/2022 CLINICAL DATA:  Altered mental status, weakness, dizziness EXAM: CT HEAD WITHOUT CONTRAST TECHNIQUE: Contiguous axial images were obtained from the base of the skull  through the vertex without intravenous contrast. RADIATION DOSE REDUCTION: This exam was performed according to the departmental dose-optimization program which includes automated exposure control, adjustment of the mA and/or kV according to patient size and/or use of iterative reconstruction technique. COMPARISON:  09/16/2022 FINDINGS: Brain: No evidence of acute infarction, hemorrhage, hydrocephalus, extra-axial collection or mass lesion/mass effect. Mild global cortical atrophy. Subcortical white matter and periventricular small vessel ischemic changes. Vascular: Intracranial atherosclerosis. Skull: Normal. Negative for fracture or focal lesion. Sinuses/Orbits: The visualized paranasal sinuses are essentially clear. The mastoid air cells are unopacified. Other: None. IMPRESSION: No acute intracranial abnormality. Mild atrophy with small vessel ischemic changes. Electronically Signed   By: Charline Bills M.D.   On: 12/18/2022 00:11   CT ABDOMEN PELVIS WO CONTRAST  Result Date: 12/18/2022 CLINICAL DATA:  Diarrhea, weakness EXAM: CT ABDOMEN AND PELVIS WITHOUT CONTRAST TECHNIQUE: Multidetector CT imaging of the abdomen and pelvis was performed following the standard protocol without IV contrast. RADIATION DOSE REDUCTION: This exam was performed according to the departmental dose-optimization program which includes automated exposure control, adjustment of the mA and/or kV according to patient size and/or use of iterative reconstruction technique. COMPARISON:  CTA abdomen/pelvis dated 06/23/2008 FINDINGS: Lower chest: Lung bases are clear. Hepatobiliary: Unenhanced liver is unremarkable. Mildly distended gallbladder. No intrahepatic or extrahepatic duct dilatation. Pancreas: Within normal limits. Spleen: Within normal limits. Adrenals/Urinary  Tract: Adrenal glands are within normal limits. Right renal cortical atrophy. 2.0 cm left upper pole renal cyst (series 2/image 24), measuring simple fluid density, benign  (Bosniak I). No follow-up is recommended. Renal vascular calcifications. No hydronephrosis. Bladder is within normal limits. Stomach/Bowel: Stomach is notable for a tiny hiatal hernia. No evidence of bowel obstruction. Appendix is not discretely visualized. Segmental wall thickening involving the transverse colon (series 2/image 44), suggesting infectious/inflammatory colitis. No pneumatosis or free air. 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 Atherosclerotic calcifications of the abdominal aorta and branch vessels. Left renal stent. No suspicious abdominopelvic lymphadenopathy. Reproductive: Uterus is grossly unremarkable. Bilateral ovaries are within normal limits. Other: Suspected trace pelvic ascites (series 2/image 67). Musculoskeletal: Degenerative changes of the visualized thoracolumbar spine. IMPRESSION: Segmental wall thickening involving the transverse colon, suggesting infectious/inflammatory colitis. Additional ancillary findings as above. Electronically Signed   By: Charline Bills M.D.   On: 12/18/2022 00:08   DG Chest 1 View  Result Date: 12/17/2022 CLINICAL DATA:  Weakness. EXAM: CHEST  1 VIEW COMPARISON:  10/17/2015 FINDINGS: Lung volumes are low. Bronchovascular crowding versus vascular congestion. The heart is normal in size. Aortic atherosclerosis. No pneumothorax or pleural effusion. No acute osseous findings. IMPRESSION: Low lung volumes with bronchovascular crowding versus vascular congestion. Electronically Signed   By: Narda Rutherford M.D.   On: 12/17/2022 22:50     PROCEDURES:  Critical Care performed: Yes, see critical care procedure note(s)  CRITICAL CARE Performed by: Irean Hong   Total critical care time: 45 minutes  Critical care time was exclusive of separately billable procedures and treating other patients.  Critical care was necessary to treat or prevent imminent or life-threatening  deterioration.  Critical care was time spent personally by me on the following activities: development of treatment plan with patient and/or surrogate as well as nursing, discussions with consultants, evaluation of patient's response to treatment, examination of patient, obtaining history from patient or surrogate, ordering and performing treatments and interventions, ordering and review of laboratory studies, ordering and review of radiographic studies, pulse oximetry and re-evaluation of patient's condition.   Marland Kitchen1-3 Lead EKG Interpretation  Performed by: Irean Hong, MD Authorized by: Irean Hong, MD     Interpretation: normal     ECG rate:  80   ECG rate assessment: normal     Rhythm: sinus rhythm     Ectopy: none     Conduction: normal   Comments:     Placed on cardiac monitor to evaluate for arrhythmias    MEDICATIONS ORDERED IN ED: Medications  sodium chloride 0.9 % bolus 1,000 mL (has no administration in time range)    And  sodium chloride 0.9 % bolus 1,000 mL (has no administration in time range)  cefTRIAXone (ROCEPHIN) 2 g in sodium chloride 0.9 % 100 mL IVPB (has no administration in time range)  metroNIDAZOLE (FLAGYL) IVPB 500 mg (has no administration in time range)  sodium chloride 0.9 % bolus 1,000 mL (0 mLs Intravenous Stopped 12/18/22 0036)  ondansetron (ZOFRAN) injection 4 mg (4 mg Intravenous Given 12/17/22 2339)  morphine (PF) 2 MG/ML injection 2 mg (2 mg Intravenous Given 12/17/22 2341)     IMPRESSION / MDM / ASSESSMENT AND PLAN / ED COURSE  I reviewed the triage vital signs and the nursing notes.  82 year old female presenting with generalized weakness, chest pain, nausea and dizziness. Differential diagnosis includes, but is not limited to, ACS, aortic dissection, pulmonary embolism, cardiac tamponade, pneumothorax, pneumonia, pericarditis, myocarditis, GI-related causes including esophagitis/gastritis, and musculoskeletal chest wall  pain.   Personally reviewed patient's records and note a PCP office visit for depression on 11/03/2022.  Patient's presentation is most consistent with acute presentation with potential threat to life or bodily function.  The patient is on the cardiac monitor to evaluate for evidence of arrhythmia and/or significant heart rate changes.  Patient had low-grade temperature on arrival.  Will obtain sepsis lab work, CT head for dizziness, CT abdomen/pelvis for diarrhea and reassess.  Clinical Course as of 12/18/22 0044  Thu Dec 18, 2022  0041 Laboratory results demonstrate mild leukocytosis with WBC 11.3, AKI creatinine 1.29, moderate leukocyte positive UTI, although lactic acid is normal, procalcitonin is elevated.  Will initiate ED code sepsis and start 30 cc/kilo IV fluids with broad-spectrum IV antibiotics.  CT head is negative for intracranial hemorrhage.  CT abdomen/pelvis is positive for colitis.  I have updated patient and her daughter and will consult hospital services for evaluation and admission. [JS]    Clinical Course User Index [JS] Irean Hong, MD     FINAL CLINICAL IMPRESSION(S) / ED DIAGNOSES   Final diagnoses:  Generalized weakness  Sepsis, due to unspecified organism, unspecified whether acute organ dysfunction present  Diarrhea, unspecified type  Lower urinary tract infectious disease  Colitis     Rx / DC Orders   ED Discharge Orders     None        Note:  This document was prepared using Dragon voice recognition software and may include unintentional dictation errors.   Irean Hong, MD 12/18/22 [phone]

## 2022-12-18 ENCOUNTER — Encounter: Payer: Self-pay | Admitting: Family Medicine

## 2022-12-18 ENCOUNTER — Other Ambulatory Visit: Payer: Self-pay

## 2022-12-18 ENCOUNTER — Inpatient Hospital Stay: Payer: Medicare HMO

## 2022-12-18 DIAGNOSIS — E785 Hyperlipidemia, unspecified: Secondary | ICD-10-CM | POA: Diagnosis not present

## 2022-12-18 DIAGNOSIS — G894 Chronic pain syndrome: Secondary | ICD-10-CM | POA: Diagnosis present

## 2022-12-18 DIAGNOSIS — N1832 Chronic kidney disease, stage 3b: Secondary | ICD-10-CM | POA: Diagnosis present

## 2022-12-18 DIAGNOSIS — R531 Weakness: Secondary | ICD-10-CM | POA: Diagnosis present

## 2022-12-18 DIAGNOSIS — K219 Gastro-esophageal reflux disease without esophagitis: Secondary | ICD-10-CM | POA: Diagnosis present

## 2022-12-18 DIAGNOSIS — I2510 Atherosclerotic heart disease of native coronary artery without angina pectoris: Secondary | ICD-10-CM | POA: Diagnosis present

## 2022-12-18 DIAGNOSIS — Z853 Personal history of malignant neoplasm of breast: Secondary | ICD-10-CM

## 2022-12-18 DIAGNOSIS — R0902 Hypoxemia: Secondary | ICD-10-CM | POA: Diagnosis present

## 2022-12-18 DIAGNOSIS — E1122 Type 2 diabetes mellitus with diabetic chronic kidney disease: Secondary | ICD-10-CM | POA: Diagnosis present

## 2022-12-18 DIAGNOSIS — Z79890 Hormone replacement therapy (postmenopausal): Secondary | ICD-10-CM | POA: Diagnosis not present

## 2022-12-18 DIAGNOSIS — B9620 Unspecified Escherichia coli [E. coli] as the cause of diseases classified elsewhere: Secondary | ICD-10-CM | POA: Diagnosis present

## 2022-12-18 DIAGNOSIS — Z1152 Encounter for screening for COVID-19: Secondary | ICD-10-CM | POA: Diagnosis not present

## 2022-12-18 DIAGNOSIS — K529 Noninfective gastroenteritis and colitis, unspecified: Secondary | ICD-10-CM | POA: Diagnosis present

## 2022-12-18 DIAGNOSIS — E1151 Type 2 diabetes mellitus with diabetic peripheral angiopathy without gangrene: Secondary | ICD-10-CM | POA: Diagnosis present

## 2022-12-18 DIAGNOSIS — J439 Emphysema, unspecified: Secondary | ICD-10-CM | POA: Diagnosis present

## 2022-12-18 DIAGNOSIS — Z8249 Family history of ischemic heart disease and other diseases of the circulatory system: Secondary | ICD-10-CM | POA: Diagnosis not present

## 2022-12-18 DIAGNOSIS — I10 Essential (primary) hypertension: Secondary | ICD-10-CM

## 2022-12-18 DIAGNOSIS — I129 Hypertensive chronic kidney disease with stage 1 through stage 4 chronic kidney disease, or unspecified chronic kidney disease: Secondary | ICD-10-CM | POA: Diagnosis present

## 2022-12-18 DIAGNOSIS — Z79811 Long term (current) use of aromatase inhibitors: Secondary | ICD-10-CM | POA: Diagnosis not present

## 2022-12-18 DIAGNOSIS — E039 Hypothyroidism, unspecified: Secondary | ICD-10-CM | POA: Diagnosis present

## 2022-12-18 DIAGNOSIS — E7800 Pure hypercholesterolemia, unspecified: Secondary | ICD-10-CM | POA: Diagnosis present

## 2022-12-18 DIAGNOSIS — F32A Depression, unspecified: Secondary | ICD-10-CM | POA: Diagnosis present

## 2022-12-18 DIAGNOSIS — N390 Urinary tract infection, site not specified: Secondary | ICD-10-CM | POA: Diagnosis present

## 2022-12-18 DIAGNOSIS — Z87891 Personal history of nicotine dependence: Secondary | ICD-10-CM | POA: Diagnosis not present

## 2022-12-18 DIAGNOSIS — F5104 Psychophysiologic insomnia: Secondary | ICD-10-CM | POA: Diagnosis present

## 2022-12-18 DIAGNOSIS — M810 Age-related osteoporosis without current pathological fracture: Secondary | ICD-10-CM | POA: Diagnosis present

## 2022-12-18 DIAGNOSIS — M5030 Other cervical disc degeneration, unspecified cervical region: Secondary | ICD-10-CM | POA: Diagnosis present

## 2022-12-18 LAB — CBC
HCT: 30.6 % — ABNORMAL LOW (ref 36.0–46.0)
Hemoglobin: 10 g/dL — ABNORMAL LOW (ref 12.0–15.0)
MCH: 31.3 pg (ref 26.0–34.0)
MCHC: 32.7 g/dL (ref 30.0–36.0)
MCV: 95.9 fL (ref 80.0–100.0)
Platelets: 128 10*3/uL — ABNORMAL LOW (ref 150–400)
RBC: 3.19 MIL/uL — ABNORMAL LOW (ref 3.87–5.11)
RDW: 12.8 % (ref 11.5–15.5)
WBC: 14.9 10*3/uL — ABNORMAL HIGH (ref 4.0–10.5)
nRBC: 0 % (ref 0.0–0.2)

## 2022-12-18 LAB — BASIC METABOLIC PANEL
Anion gap: 7 (ref 5–15)
BUN: 17 mg/dL (ref 8–23)
CO2: 23 mmol/L (ref 22–32)
Calcium: 7.4 mg/dL — ABNORMAL LOW (ref 8.9–10.3)
Chloride: 107 mmol/L (ref 98–111)
Creatinine, Ser: 1.13 mg/dL — ABNORMAL HIGH (ref 0.44–1.00)
GFR, Estimated: 49 mL/min — ABNORMAL LOW (ref >60)
Glucose, Bld: 95 mg/dL (ref 70–99)
Potassium: 4.4 mmol/L (ref 3.5–5.1)
Sodium: 137 mmol/L (ref 135–145)

## 2022-12-18 LAB — URINALYSIS, ROUTINE W REFLEX MICROSCOPIC
Bilirubin Urine: NEGATIVE
Glucose, UA: NEGATIVE mg/dL
Hgb urine dipstick: NEGATIVE
Ketones, ur: NEGATIVE mg/dL
Nitrite: NEGATIVE
Protein, ur: NEGATIVE mg/dL
Specific Gravity, Urine: 1.011 (ref 1.005–1.030)
pH: 5 (ref 5.0–8.0)

## 2022-12-18 LAB — PROCALCITONIN: Procalcitonin: 5.76 ng/mL

## 2022-12-18 LAB — TROPONIN I (HIGH SENSITIVITY): Troponin I (High Sensitivity): 5 ng/L (ref <18)

## 2022-12-18 LAB — CULTURE, BLOOD (ROUTINE X 2)
Culture: NO GROWTH
Special Requests: ADEQUATE

## 2022-12-18 LAB — BRAIN NATRIURETIC PEPTIDE: B Natriuretic Peptide: 229.6 pg/mL — ABNORMAL HIGH (ref 0.0–100.0)

## 2022-12-18 LAB — LACTIC ACID, PLASMA: Lactic Acid, Venous: 1.6 mmol/L (ref 0.5–1.9)

## 2022-12-18 MED ORDER — ENOXAPARIN SODIUM 60 MG/0.6ML IJ SOSY
0.5000 mg/kg | PREFILLED_SYRINGE | INTRAMUSCULAR | Status: DC
  Administered 2022-12-18: 42.5 mg via SUBCUTANEOUS
  Filled 2022-12-18: qty 0.6

## 2022-12-18 MED ORDER — HYDROCODONE-ACETAMINOPHEN 5-325 MG PO TABS
1.0000 | ORAL_TABLET | Freq: Two times a day (BID) | ORAL | Status: DC | PRN
  Administered 2022-12-18 – 2022-12-20 (×4): 1 via ORAL
  Filled 2022-12-18 (×4): qty 1

## 2022-12-18 MED ORDER — POTASSIUM CHLORIDE CRYS ER 20 MEQ PO TBCR: 20.0000 meq | EXTENDED_RELEASE_TABLET | Freq: Every day | ORAL | Status: DC | PRN

## 2022-12-18 MED ORDER — ANASTROZOLE 1 MG PO TABS
1.0000 mg | ORAL_TABLET | Freq: Every day | ORAL | Status: DC
  Administered 2022-12-18 – 2022-12-21 (×4): 1 mg via ORAL
  Filled 2022-12-18 (×4): qty 1

## 2022-12-18 MED ORDER — AZELASTINE HCL 0.1 % NA SOLN: 2.0000 | Freq: Two times a day (BID) | NASAL | Status: DC | PRN

## 2022-12-18 MED ORDER — ASPIRIN 325 MG PO TBEC: 325.0000 mg | DELAYED_RELEASE_TABLET | Freq: Every day | ORAL | Status: DC

## 2022-12-18 MED ORDER — MIRTAZAPINE 15 MG PO TABS
15.0000 mg | ORAL_TABLET | Freq: Every day | ORAL | Status: DC
  Administered 2022-12-18 – 2022-12-20 (×3): 15 mg via ORAL
  Filled 2022-12-18 (×3): qty 1

## 2022-12-18 MED ORDER — ATORVASTATIN CALCIUM 20 MG PO TABS: 80.0000 mg | ORAL_TABLET | Freq: Every day | ORAL | Status: DC

## 2022-12-18 MED ORDER — SODIUM CHLORIDE 0.9 % IV SOLN
2.0000 g | Freq: Once | INTRAVENOUS | Status: AC
  Administered 2022-12-18: 2 g via INTRAVENOUS
  Filled 2022-12-18: qty 20

## 2022-12-18 MED ORDER — VITAMIN B-12 1000 MCG PO TABS
1000.0000 ug | ORAL_TABLET | Freq: Every day | ORAL | Status: DC
  Administered 2022-12-18 – 2022-12-21 (×4): 1000 ug via ORAL
  Filled 2022-12-18 (×2): qty 1
  Filled 2022-12-18: qty 2
  Filled 2022-12-18: qty 1

## 2022-12-18 MED ORDER — ONDANSETRON HCL 4 MG/2ML IJ SOLN: 4.0000 mg | Freq: Four times a day (QID) | INTRAMUSCULAR | Status: DC | PRN

## 2022-12-18 MED ORDER — FUROSEMIDE 40 MG PO TABS: 40.0000 mg | ORAL_TABLET | Freq: Every day | ORAL | Status: DC | PRN

## 2022-12-18 MED ORDER — SODIUM CHLORIDE 0.9 % IV SOLN: INTRAVENOUS | Status: DC

## 2022-12-18 MED ORDER — LEVOTHYROXINE SODIUM 88 MCG PO TABS
88.0000 ug | ORAL_TABLET | Freq: Every day | ORAL | Status: DC
  Administered 2022-12-18 – 2022-12-21 (×4): 88 ug via ORAL
  Filled 2022-12-18 (×4): qty 1

## 2022-12-18 MED ORDER — GABAPENTIN 300 MG PO CAPS
600.0000 mg | ORAL_CAPSULE | Freq: Every day | ORAL | Status: DC
  Administered 2022-12-18 – 2022-12-20 (×3): 600 mg via ORAL
  Filled 2022-12-18 (×3): qty 2

## 2022-12-18 MED ORDER — ONDANSETRON HCL 4 MG PO TABS: 4.0000 mg | ORAL_TABLET | Freq: Four times a day (QID) | ORAL | Status: DC | PRN

## 2022-12-18 MED ORDER — FLUTICASONE PROPIONATE 50 MCG/ACT NA SUSP: 1.0000 | Freq: Every day | NASAL | Status: DC | PRN

## 2022-12-18 MED ORDER — METRONIDAZOLE 500 MG/100ML IV SOLN
500.0000 mg | Freq: Three times a day (TID) | INTRAVENOUS | Status: DC
  Administered 2022-12-18 – 2022-12-21 (×10): 500 mg via INTRAVENOUS
  Filled 2022-12-18 (×10): qty 100

## 2022-12-18 MED ORDER — HYDRALAZINE HCL 50 MG PO TABS
50.0000 mg | ORAL_TABLET | Freq: Two times a day (BID) | ORAL | Status: DC
  Administered 2022-12-18 – 2022-12-21 (×7): 50 mg via ORAL
  Filled 2022-12-18 (×7): qty 1

## 2022-12-18 MED ORDER — TRAZODONE HCL 50 MG PO TABS
25.0000 mg | ORAL_TABLET | Freq: Every evening | ORAL | Status: DC | PRN
  Filled 2022-12-18: qty 1

## 2022-12-18 MED ORDER — CARVEDILOL 6.25 MG PO TABS
6.2500 mg | ORAL_TABLET | Freq: Two times a day (BID) | ORAL | Status: DC
  Administered 2022-12-18 – 2022-12-21 (×7): 6.25 mg via ORAL
  Filled 2022-12-18 (×7): qty 1

## 2022-12-18 MED ORDER — ACETAMINOPHEN 650 MG RE SUPP: 650.0000 mg | Freq: Four times a day (QID) | RECTAL | Status: DC | PRN

## 2022-12-18 MED ORDER — SODIUM CHLORIDE 0.9 % IV BOLUS (SEPSIS): 1000.0000 mL | Freq: Once | INTRAVENOUS | Status: DC

## 2022-12-18 MED ORDER — ENOXAPARIN SODIUM 40 MG/0.4ML IJ SOSY
40.0000 mg | PREFILLED_SYRINGE | INTRAMUSCULAR | Status: DC
  Administered 2022-12-19 – 2022-12-21 (×3): 40 mg via SUBCUTANEOUS
  Filled 2022-12-18 (×3): qty 0.4

## 2022-12-18 MED ORDER — SODIUM CHLORIDE 0.9 % IV BOLUS (SEPSIS)
1000.0000 mL | Freq: Once | INTRAVENOUS | Status: AC
  Administered 2022-12-18: 1000 mL via INTRAVENOUS

## 2022-12-18 MED ORDER — PANTOPRAZOLE SODIUM 40 MG PO TBEC
40.0000 mg | DELAYED_RELEASE_TABLET | Freq: Every day | ORAL | Status: DC
  Administered 2022-12-18 – 2022-12-21 (×4): 40 mg via ORAL
  Filled 2022-12-18 (×4): qty 1

## 2022-12-18 MED ORDER — METRONIDAZOLE 500 MG/100ML IV SOLN
500.0000 mg | Freq: Once | INTRAVENOUS | Status: AC
  Administered 2022-12-18: 500 mg via INTRAVENOUS
  Filled 2022-12-18: qty 100

## 2022-12-18 MED ORDER — ACETAMINOPHEN 325 MG PO TABS
650.0000 mg | ORAL_TABLET | Freq: Four times a day (QID) | ORAL | Status: DC | PRN
  Administered 2022-12-18 – 2022-12-20 (×5): 650 mg via ORAL
  Filled 2022-12-18 (×5): qty 2

## 2022-12-18 MED ORDER — IOHEXOL 350 MG/ML SOLN
75.0000 mL | Freq: Once | INTRAVENOUS | Status: AC | PRN
  Administered 2022-12-18: 75 mL via INTRAVENOUS

## 2022-12-18 MED ORDER — OYSTER SHELL CALCIUM/D3 500-5 MG-MCG PO TABS
1.0000 | ORAL_TABLET | Freq: Every day | ORAL | Status: DC
  Administered 2022-12-18 – 2022-12-21 (×4): 1 via ORAL
  Filled 2022-12-18 (×4): qty 1

## 2022-12-18 MED ORDER — ALBUTEROL SULFATE HFA 108 (90 BASE) MCG/ACT IN AERS: 2.0000 | INHALATION_SPRAY | Freq: Four times a day (QID) | RESPIRATORY_TRACT | Status: DC | PRN

## 2022-12-18 MED ORDER — LEVOTHYROXINE SODIUM 88 MCG PO TABS: 88.0000 ug | ORAL_TABLET | Freq: Every day | ORAL | Status: DC

## 2022-12-18 MED ORDER — ALBUTEROL SULFATE (2.5 MG/3ML) 0.083% IN NEBU
2.5000 mg | INHALATION_SOLUTION | Freq: Four times a day (QID) | RESPIRATORY_TRACT | Status: DC | PRN
  Filled 2022-12-18: qty 3

## 2022-12-18 MED ORDER — ATORVASTATIN CALCIUM 20 MG PO TABS: 40.0000 mg | ORAL_TABLET | Freq: Every day | ORAL | Status: DC

## 2022-12-18 MED ORDER — SODIUM CHLORIDE 0.9 % IV SOLN
2.0000 g | INTRAVENOUS | Status: DC
  Administered 2022-12-18 – 2022-12-20 (×3): 2 g via INTRAVENOUS
  Filled 2022-12-18: qty 20
  Filled 2022-12-18: qty 2
  Filled 2022-12-18: qty 20

## 2022-12-18 MED ORDER — ASPIRIN 81 MG PO CHEW: 81.0000 mg | CHEWABLE_TABLET | Freq: Every day | ORAL | Status: DC

## 2022-12-18 NOTE — Assessment & Plan Note (Signed)
-   We will continue statin therapy. 

## 2022-12-18 NOTE — H&P (Signed)
[place]   PATIENT NAME: Tamara Finley    MR#:  [id number]  DATE OF BIRTH:  04/29/1941  DATE OF ADMISSION:  12/17/2022  PRIMARY CARE PHYSICIAN: Lynnea Ferrier, MD   Patient is coming from: Home  REQUESTING/REFERRING PHYSICIAN: Chiquita Loth, MD  CHIEF COMPLAINT:   Chief Complaint  Patient presents with   Weakness    HISTORY OF PRESENT ILLNESS:  Tamara Finley is a 82 y.o. female with medical history significant for multiple medical problems that are mentioned below including type 2 diabetes mellitus, GERD, COPD, CKD 3B, coronary artery disease and AAA and TIA, who presented to the emergency room with acute onset of generalized weakness for the last day with associated right-sided chest pain felt as pressure increasing with movement as well as nausea and diarrhea with no vomiting.  She admits to urinary frequency and urgency without significant dysuria.  She denies any hematuria or flank pain.  No other paresthesias or focal muscle weakness.  No cough or wheezing.  She denies any palpitations.  No bleeding diathesis.  No dyspnea orthopnea or paroxysmal nocturnal dyspnea or worsening lower extremity edema.   ED Course:  When she came to the ER, BP was 137/58 with temperature of 100.2 and otherwise unremarkable vital signs.  Labs revealed WBC of 11.3 with high sensitive, a creatinine 1.29 and calcium 8.2 with sodium 134 and total protein 6.3 with albumin 3.4.  High-sensitivity troponin I was 6 and later 5 and BNP 229.  UA was positive for UTI. EKG as reviewed by me : EKG showed normal sinus rhythm with a rate of 82 with incomplete right bundle branch block and T wave inversion anteroseptally with left anterior fascicular block Imaging: Portable chest ray showed low lung volumes with bronchovascular crowding versus vascular congestion.  Abdominal pelvic CT scan revealed segmental wall thickening involving the transverse colon suggesting infectious/inflammatory colitis.   Noncontrasted CT scan revealed mild atrophy and small vessel ischemic changes with no acute intracranial abnormality.  The patient was given 2 mg of IV morphine sulfate, 4 mg of IV Zofran, 3 L bolus of IV normal saline as well as 2 g of IV Rocephin and 500 g of IV Flagyl.  She will be admitted to a cardiac telemetry bed for further evaluation and management. PAST MEDICAL HISTORY:   Past Medical History:  Diagnosis Date   Abdominal aortic aneurysm (AAA) 2000's   Age related entropion, unspecified laterality    Anxiety    Anxiety and depression    Bilateral renal artery stenosis    Breast calcification, left    Cancer 10/20/2016   breast cancer   Carotid artery thrombosis, bilateral    Carotid stenosis    Chronic bronchitis    Chronic insomnia    COPD (chronic obstructive pulmonary disease)    Coronary artery disease    CRI (chronic renal insufficiency), stage 3 (moderate)    Depression    Diabetes mellitus without complication    GERD (gastroesophageal reflux disease)    Heart palpitations    History of AAA (abdominal aortic aneurysm) repair    History of colon polyps    History of TIA (transient ischemic attack)    Hypercholesterolemia    Hypertension    Hypothyroidism    Migraine headache    Osteoarthritis    Osteoporosis    Peripheral arterial disease    Pulmonary scarring    Renal disorder    SOB (shortness of breath)  Stenosis of left subclavian artery    Stroke    Thyroid disease    TIA (transient ischemic attack)    Type 2 diabetes mellitus with vascular disease     PAST SURGICAL HISTORY:   Past Surgical History:  Procedure Laterality Date   ABDOMINAL AORTIC ANEURYSM REPAIR     BACK SURGERY     BREAST BIOPSY Left 09/03/2016   benign   BREAST BIOPSY Left 09/25/2016   path pending   BREAST EXCISIONAL BIOPSY Left 2010   BREAST EXCISIONAL BIOPSY Left 07/01/2006   BRONCHOSCOPY     CARDIAC SURGERY     CAROTID PTA/STENT INTERVENTION     COLONOSCOPY      COLONOSCOPY WITH PROPOFOL N/A 06/15/2017   Procedure: COLONOSCOPY WITH PROPOFOL;  Surgeon: Christena Deem, MD;  Location: [facility] ENDOSCOPY;  Service: Endoscopy;  Laterality: N/A;   EYE SURGERY     LAMINECTOMY  07/25/2013   MASTECTOMY     MEDIASTINOSCOPY     PERIPHERAL VASCULAR CATHETERIZATION N/A 12/05/2015   Procedure: Renal Angiography;  Surgeon: Renford Dills, MD;  Location: [facility] INVASIVE CV LAB;  Service: Cardiovascular;  Laterality: N/A;   PERIPHERAL VASCULAR CATHETERIZATION Left 12/31/2015   Procedure: Carotid PTA/Stent Intervention;  Surgeon: Annice Needy, MD;  Location: [facility] INVASIVE CV LAB;  Service: Cardiovascular;  Laterality: Left;   THORACOSCOPY  12/16/2016    SOCIAL HISTORY:   Social History   Tobacco Use   Smoking status: Former    Packs/day: 2.00    Years: 50.00    Additional pack years: 0.00    Total pack years: 100.00    Types: Cigarettes    Quit date: 06/01/2013    Years since quitting: 9.5   Smokeless tobacco: Never  Substance Use Topics   Alcohol use: No    Alcohol/week: 0.0 standard drinks of alcohol    FAMILY HISTORY:   Family History  Problem Relation Age of Onset   CVA Mother    Hypertension Mother    Stroke Mother    Epilepsy Mother    Dementia Father    Stroke Father    Coronary artery disease Father    Hypertension Father    Ovarian cancer Sister    Cancer Brother    Diabetes Brother     DRUG ALLERGIES:   Allergies  Allergen Reactions   Benadryl [Diphenhydramine Hcl (Sleep)] Other (See Comments)    BAD DREAMS   Alprazolam Rash    "bad dreams"    REVIEW OF SYSTEMS:   ROS As per history of present illness. All pertinent systems were reviewed above. Constitutional, HEENT, cardiovascular, respiratory, GI, GU, musculoskeletal, neuro, psychiatric, endocrine, integumentary and hematologic systems were reviewed and are otherwise negative/unremarkable except for positive findings mentioned above in the HPI.   MEDICATIONS AT HOME:    Prior to Admission medications   Medication Sig Start Date End Date Taking? Authorizing Provider  acetaminophen (TYLENOL) 500 MG tablet Take 1,000 mg by mouth every 8 (eight) hours as needed for moderate pain.     [provider]  albuterol (PROVENTIL HFA;VENTOLIN HFA) 108 (90 BASE) MCG/ACT inhaler Inhale 2 puffs into the lungs every 6 (six) hours as needed for wheezing or shortness of breath. Reported on 12/31/2015    [provider]  anastrozole (ARIMIDEX) 1 MG tablet Take 1 mg by mouth daily.  02/17/17   [provider]  aspirin 81 MG chewable tablet Chew 81 mg by mouth daily.    [provider]  atorvastatin (  LIPITOR) 40 MG tablet Take 40 mg by mouth daily at 6 PM.  05/27/16   [provider]  azelastine (ASTELIN) 0.1 % nasal spray Place into both nostrils 2 (two) times daily. Use in each nostril as directed    [provider]  Calcium Carbonate-Vit D-Min (CALCIUM 600+D PLUS MINERALS) 600-400 MG-UNIT TABS Take by mouth.    [provider]  carvedilol (COREG) 6.25 MG tablet Take 6.25 mg by mouth 2 (two) times daily with a meal.    [provider]  Docusate Sodium (DSS) 100 MG CAPS Take by mouth.    [provider]  fluticasone (FLONASE) 50 MCG/ACT nasal spray Place 1-2 sprays into both nostrils daily as needed for rhinitis.    [provider]  furosemide (LASIX) 40 MG tablet Take 1 tablet by mouth 1 day or 1 dose.    [provider]  gabapentin (NEURONTIN) 300 MG capsule Take 2 capsules (600 mg total) by mouth at bedtime. 10/30/22   Edward Jolly, MD  hydrALAZINE (APRESOLINE) 50 MG tablet Take 50 mg by mouth 3 (three) times daily.    [provider]  HYDROcodone-acetaminophen (NORCO/VICODIN) 5-325 MG tablet Take 1 tablet by mouth 2 (two) times daily as needed for moderate pain. 12/14/22 01/13/23  Edward Jolly, MD  HYDROcodone-acetaminophen (NORCO/VICODIN) 5-325 MG tablet Take 1 tablet by mouth  2 (two) times daily as needed for moderate pain. 01/13/23 02/12/23  Edward Jolly, MD  levothyroxine (SYNTHROID) 88 MCG tablet Take by mouth. 04/30/21   [provider]  levothyroxine (SYNTHROID, LEVOTHROID) 88 MCG tablet Take 88 mcg by mouth daily before breakfast.    [provider]  mirtazapine (REMERON) 15 MG tablet TAKE 1 TABLET BY MOUTH EVERY DAY AT NIGHT 01/09/18   [provider]  pantoprazole (PROTONIX) 40 MG tablet Take 40 mg by mouth daily.    [provider]  potassium chloride SA (K-DUR,KLOR-CON) 20 MEQ tablet Take 20 mEq by mouth as directed.    [provider]  vitamin B-12 (CYANOCOBALAMIN) 1000 MCG tablet Take 1 tablet by mouth 1 day or 1 dose.    [provider]      VITAL SIGNS:  Blood pressure (!) 111/51, pulse 74, temperature 98.8 F (37.1 C), resp. rate 12, height 5\' 3"  (1.6 m), weight 86.2 kg, SpO2 97 %.  PHYSICAL EXAMINATION:  Physical Exam  GENERAL:  82 y.o.-year-old female patient lying in the bed with no acute distress.  EYES: Pupils equal, round, reactive to light and accommodation. No scleral icterus. Extraocular muscles intact.  HEENT: Head atraumatic, normocephalic. Oropharynx and nasopharynx clear.  NECK:  Supple, no jugular venous distention. No thyroid enlargement, no tenderness.  LUNGS: Normal breath sounds bilaterally, no wheezing, rales,rhonchi or crepitation. No use of accessory muscles of respiration.  CARDIOVASCULAR: Regular rate and rhythm, S1, S2 normal. No murmurs, rubs, or gallops.  ABDOMEN: Soft, nondistended, with mild generalized abdominal tenderness without rebound tenderness guarding or rigidity.. Bowel sounds present. No organomegaly or mass.  EXTREMITIES: No pedal edema, cyanosis, or clubbing.  NEUROLOGIC: Cranial nerves II through XII are intact. Muscle strength 5/5 in all extremities. Sensation intact. Gait not checked.  PSYCHIATRIC: The patient is alert and oriented x 3.  Normal affect and  good eye contact. SKIN: No obvious rash, lesion, or ulcer.   LABORATORY PANEL:   CBC Recent Labs  Lab 12/18/22 0449  WBC 14.9*  HGB 10.0*  HCT 30.6*  PLT 128*   ------------------------------------------------------------------------------------------------------------------  Chemistries  Recent Labs  Lab 12/17/22 2211 12/18/22 0449  NA 134* 137  K 4.2 4.4  CL 102 107  CO2 24 23  GLUCOSE 98 95  BUN 21 17  CREATININE 1.29* 1.13*  CALCIUM 8.2* 7.4*  AST 31  --   ALT 14  --   ALKPHOS 48  --   BILITOT 0.9  --    ------------------------------------------------------------------------------------------------------------------  Cardiac Enzymes No results for input(s): "TROPONINI" in the last 168 hours. ------------------------------------------------------------------------------------------------------------------  RADIOLOGY:  CT Head Wo Contrast  Result Date: 12/18/2022 CLINICAL DATA:  Altered mental status, weakness, dizziness EXAM: CT HEAD WITHOUT CONTRAST TECHNIQUE: Contiguous axial images were obtained from the base of the skull through the vertex without intravenous contrast. RADIATION DOSE REDUCTION: This exam was performed according to the departmental dose-optimization program which includes automated exposure control, adjustment of the mA and/or kV according to patient size and/or use of iterative reconstruction technique. COMPARISON:  09/16/2022 FINDINGS: Brain: No evidence of acute infarction, hemorrhage, hydrocephalus, extra-axial collection or mass lesion/mass effect. Mild global cortical atrophy. Subcortical white matter and periventricular small vessel ischemic changes. Vascular: Intracranial atherosclerosis. Skull: Normal. Negative for fracture or focal lesion. Sinuses/Orbits: The visualized paranasal sinuses are essentially clear. The mastoid air cells are unopacified. Other: None. IMPRESSION: No acute intracranial abnormality. Mild atrophy with small vessel  ischemic changes. Electronically Signed   By: Charline Bills M.D.   On: 12/18/2022 00:11   CT ABDOMEN PELVIS WO CONTRAST  Result Date: 12/18/2022 CLINICAL DATA:  Diarrhea, weakness EXAM: CT ABDOMEN AND PELVIS WITHOUT CONTRAST TECHNIQUE: Multidetector CT imaging of the abdomen and pelvis was performed following the standard protocol without IV contrast. RADIATION DOSE REDUCTION: This exam was performed according to the departmental dose-optimization program which includes automated exposure control, adjustment of the mA and/or kV according to patient size and/or use of iterative reconstruction technique. COMPARISON:  CTA abdomen/pelvis dated 06/23/2008 FINDINGS: Lower chest: Lung bases are clear. Hepatobiliary: Unenhanced liver is unremarkable. Mildly distended gallbladder. No intrahepatic or extrahepatic duct dilatation. Pancreas: Within normal limits. Spleen: Within normal limits. Adrenals/Urinary Tract: Adrenal glands are within normal limits. Right renal cortical atrophy. 2.0 cm left upper pole renal cyst (series 2/image 24), measuring simple fluid density, benign (Bosniak I). No follow-up is recommended. Renal vascular calcifications. No hydronephrosis. Bladder is within normal limits. Stomach/Bowel: Stomach is notable for a tiny hiatal hernia. No evidence of bowel obstruction. Appendix is not discretely visualized. Segmental wall thickening involving the transverse colon (series 2/image 44), suggesting infectious/inflammatory colitis. No pneumatosis or free air. 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 Atherosclerotic calcifications of the abdominal aorta and branch vessels. Left renal stent. No suspicious abdominopelvic lymphadenopathy. Reproductive: Uterus is grossly unremarkable. Bilateral ovaries are within normal limits. Other: Suspected trace pelvic ascites (series 2/image 67). Musculoskeletal: Degenerative changes of the visualized thoracolumbar  spine. IMPRESSION: Segmental wall thickening involving the transverse colon, suggesting infectious/inflammatory colitis. Additional ancillary findings as above. Electronically Signed   By: Charline Bills M.D.   On: 12/18/2022 00:08   DG Chest 1 View  Result Date: 12/17/2022 CLINICAL DATA:  Weakness. EXAM: CHEST  1 VIEW COMPARISON:  10/17/2015 FINDINGS: Lung volumes are low. Bronchovascular crowding versus vascular congestion. The heart is normal in size. Aortic atherosclerosis. No pneumothorax or pleural effusion. No acute osseous findings. IMPRESSION: Low lung volumes with bronchovascular crowding versus vascular congestion. Electronically Signed   By: Narda Rutherford M.D.   On: 12/17/2022 22:50      IMPRESSION AND PLAN:  Assessment and Plan: *  Acute colitis - The patient be admitted to a telemetry bed. - We will continue antibiotic therapy with IV Rocephin and Flagyl. - We will follow stool studies ordered by ED physician and C. difficile. - She will be hydrated with IV normal saline.  Acute lower UTI - This should be With IV Rocephin. - Urine culture will be obtained.  Generalized weakness - This is nearly secondary to #1 #2. - Management as above.  History of breast cancer - We will continue Arimidex.  Hypothyroidism - Continue Synthroid.  Essential hypertension - We will continue her antihypertensives.  Dyslipidemia - We will continue statin therapy.  Depression - We will continue her Lexapro and Remeron.       DVT prophylaxis: Lovenox.  Advanced Care Planning:  Code Status: full code.  Family Communication:  The plan of care was discussed in details with the patient (and family). I answered all questions. The patient agreed to proceed with the above mentioned plan. Further management will depend upon hospital course. Disposition Plan: Back to previous home environment Consults called: none. All the records are reviewed and case discussed with ED  provider.  Status is: Inpatient   At the time of the admission, it appears that the appropriate admission status for this patient is inpatient.  This is judged to be reasonable and necessary in order to provide the required intensity of service to ensure the patient's safety given the presenting symptoms, physical exam findings and initial radiographic and laboratory data in the context of comorbid conditions.  The patient requires inpatient status due to high intensity of service, high risk of further deterioration and high frequency of surveillance required.  I certify that at the time of admission, it is my clinical judgment that the patient will require inpatient hospital care extending more than 2 midnights.                            Dispo: The patient is from: Home              Anticipated d/c is to: Home              Patient currently is not medically stable to d/c.              Difficult to place patient: No  Hannah Beat M.D on 12/18/2022 at 5:58 AM  [facility]   From 7 PM-7 AM, contact night-coverage [email or web address]  CC: Primary care physician; Lynnea Ferrier, MD

## 2022-12-18 NOTE — Assessment & Plan Note (Signed)
Continue Synthroid °

## 2022-12-18 NOTE — Progress Notes (Signed)
PHARMACIST - PHYSICIAN COMMUNICATION  CONCERNING:  Enoxaparin (Lovenox) for DVT Prophylaxis    RECOMMENDATION: Patient was prescribed enoxaprin 40mg  q24 hours for VTE prophylaxis.   Filed Weights   12/17/22 2208  Weight: 86.2 kg (190 lb)    Body mass index is 33.66 kg/m.  Estimated Creatinine Clearance: 36.2 mL/min (A) (by C-G formula based on SCr of 1.29 mg/dL (H)).   Based on [facility] policy patient is candidate for enoxaparin 0.5mg /kg TBW SQ every 24 hours based on BMI being >30.  DESCRIPTION: Pharmacy has adjusted enoxaparin dose per [facility] policy.  Patient is now receiving enoxaparin 0.5 mg/kg every 24 hours   Otelia Sergeant, PharmD, [facility] 12/18/2022 3:45 AM

## 2022-12-18 NOTE — Assessment & Plan Note (Signed)
-   The patient be admitted to a telemetry bed. - We will continue antibiotic therapy with IV Rocephin and Flagyl. - We will follow stool studies ordered by ED physician and C. difficile. - She will be hydrated with IV normal saline.

## 2022-12-18 NOTE — Assessment & Plan Note (Addendum)
-   We will continue Arimidex. 

## 2022-12-18 NOTE — Progress Notes (Signed)
Patient admitted this morning for evaluation of pleuritic chest pain, abdominal pain and diarrhea.  She also complains of weakness. Patient has a remote history of breast cancer and is on  anastrozole  Labs show worsening leukocytosis 11.3 >> 14.9 Imaging shows segmental wall thickening involving the transverse colon, suggesting infectious/inflammatory colitis.  Patient denies recent antibiotic use Procalcitonin is elevated at 5.76 and stool for C. difficile is pending Patient also has pyuria Continue empiric antibiotic therapy with Rocephin and Flagyl Follow-up results of urine culture Obtain CT angiogram to rule out pulmonary embolism.

## 2022-12-18 NOTE — Progress Notes (Signed)
CODE SEPSIS - PHARMACY COMMUNICATION  **Broad Spectrum Antibiotics should be administered within 1 hour of Sepsis diagnosis**  Time Code Sepsis Called/Page Received: 8657  Antibiotics Ordered: Ceftriaxone & Flagyl  Time of 1st antibiotic administration: 0056  Otelia Sergeant, PharmD, [facility] 12/18/2022 1:02 AM

## 2022-12-18 NOTE — Assessment & Plan Note (Signed)
-   We will continue her antihypertensives. 

## 2022-12-18 NOTE — Assessment & Plan Note (Signed)
-   This is nearly secondary to #1 #2. - Management as above.

## 2022-12-18 NOTE — Plan of Care (Signed)

## 2022-12-18 NOTE — ED Notes (Signed)
ED TO INPATIENT HANDOFF REPORT  ED Nurse Name and Phone #: Jen Mow [phone]  S Name/Age/Gender Tamara Finley 82 y.o. female Room/Bed: ED36A/ED36A  Code Status   Code Status: Full Code  Home/SNF/Other Home Patient oriented to: self, place, time, and situation Is this baseline? Yes   Triage Complete: Triage complete  Chief Complaint Acute colitis [K52.9]  Triage Note EMS brings pt in from home for c/o weakness today with dizziness   Allergies Allergies  Allergen Reactions   Benadryl [Diphenhydramine Hcl (Sleep)] Other (See Comments)    BAD DREAMS   Alprazolam Rash    "bad dreams"    Level of Care/Admitting Diagnosis ED Disposition     ED Disposition  Admit   Condition  --   Comment  Hospital Area: [facility] [[id number]]  Level of Care: Telemetry Cardiac [[id number]]  Covid Evaluation: Asymptomatic - no recent exposure (last 10 days) testing not required  Diagnosis: Acute colitis [454098]  Admitting Physician: Hannah Beat [[id number]]  Attending Physician: Hannah Beat [[id number]]  Certification:: I certify this patient will need inpatient services for at least 2 midnights  Estimated Length of Stay: 2          B Medical/Surgery History Past Medical History:  Diagnosis Date   Abdominal aortic aneurysm (AAA) 2000's   Age related entropion, unspecified laterality    Anxiety    Anxiety and depression    Bilateral renal artery stenosis    Breast calcification, left    Cancer 10/20/2016   breast cancer   Carotid artery thrombosis, bilateral    Carotid stenosis    Chronic bronchitis    Chronic insomnia    COPD (chronic obstructive pulmonary disease)    Coronary artery disease    CRI (chronic renal insufficiency), stage 3 (moderate)    Depression    Diabetes mellitus without complication    GERD (gastroesophageal reflux disease)    Heart palpitations    History of AAA (abdominal aortic aneurysm) repair    History of colon polyps     History of TIA (transient ischemic attack)    Hypercholesterolemia    Hypertension    Hypothyroidism    Migraine headache    Osteoarthritis    Osteoporosis    Peripheral arterial disease    Pulmonary scarring    Renal disorder    SOB (shortness of breath)    Stenosis of left subclavian artery    Stroke    Thyroid disease    TIA (transient ischemic attack)    Type 2 diabetes mellitus with vascular disease    Past Surgical History:  Procedure Laterality Date   ABDOMINAL AORTIC ANEURYSM REPAIR     BACK SURGERY     BREAST BIOPSY Left 09/03/2016   benign   BREAST BIOPSY Left 09/25/2016   path pending   BREAST EXCISIONAL BIOPSY Left 2010   BREAST EXCISIONAL BIOPSY Left 07/01/2006   BRONCHOSCOPY     CARDIAC SURGERY     CAROTID PTA/STENT INTERVENTION     COLONOSCOPY     COLONOSCOPY WITH PROPOFOL N/A 06/15/2017   Procedure: COLONOSCOPY WITH PROPOFOL;  Surgeon: Christena Deem, MD;  Location: [facility] ENDOSCOPY;  Service: Endoscopy;  Laterality: N/A;   EYE SURGERY     LAMINECTOMY  07/25/2013   MASTECTOMY     MEDIASTINOSCOPY     PERIPHERAL VASCULAR CATHETERIZATION N/A 12/05/2015   Procedure: Renal Angiography;  Surgeon: Renford Dills, MD;  Location: [facility] INVASIVE CV LAB;  Service: Cardiovascular;  Laterality: N/A;   PERIPHERAL VASCULAR CATHETERIZATION Left 12/31/2015   Procedure: Carotid PTA/Stent Intervention;  Surgeon: Annice Needy, MD;  Location: [facility] INVASIVE CV LAB;  Service: Cardiovascular;  Laterality: Left;   THORACOSCOPY  12/16/2016     A IV Location/Drains/Wounds Patient Lines/Drains/Airways Status     Active Line/Drains/Airways     Name Placement date Placement time Site Days   Peripheral IV 12/17/22 18 G Left Antecubital 12/17/22  2201  Antecubital  1            Intake/Output Last 24 hours No intake or output data in the 24 hours ending 12/18/22 1445  Labs/Imaging Results for orders placed or performed during the hospital encounter of 12/17/22 (from the  past 48 hour(s))  CBC with Differential     Status: Abnormal   Collection Time: 12/17/22 10:11 PM  Result Value Ref Range   WBC 11.3 (H) 4.0 - 10.5 K/uL   RBC 3.77 (L) 3.87 - 5.11 MIL/uL   Hemoglobin 11.6 (L) 12.0 - 15.0 g/dL   HCT 16.1 (L) 09.6 - 04.5 %   MCV 94.7 80.0 - 100.0 fL   MCH 30.8 26.0 - 34.0 pg   MCHC 32.5 30.0 - 36.0 g/dL   RDW 40.9 81.1 - 91.4 %   Platelets 142 (L) 150 - 400 K/uL   nRBC 0.0 0.0 - 0.2 %   Neutrophils Relative % 87 %   Neutro Abs 9.8 (H) 1.7 - 7.7 K/uL   Lymphocytes Relative 7 %   Lymphs Abs 0.7 0.7 - 4.0 K/uL   Monocytes Relative 6 %   Monocytes Absolute 0.7 0.1 - 1.0 K/uL   Eosinophils Relative 0 %   Eosinophils Absolute 0.0 0.0 - 0.5 K/uL   Basophils Relative 0 %   Basophils Absolute 0.0 0.0 - 0.1 K/uL   Immature Granulocytes 0 %   Abs Immature Granulocytes 0.03 0.00 - 0.07 K/uL    Comment: Performed at [facility], [address] [place], [place] [address]  Comprehensive metabolic panel     Status: Abnormal   Collection Time: 12/17/22 10:11 PM  Result Value Ref Range   Sodium 134 (L) 135 - 145 mmol/L   Potassium 4.2 3.5 - 5.1 mmol/L   Chloride 102 98 - 111 mmol/L   CO2 24 22 - 32 mmol/L   Glucose, Bld 98 70 - 99 mg/dL    Comment: Glucose reference range applies only to samples taken after fasting for at least 8 hours.   BUN 21 8 - 23 mg/dL   Creatinine, Ser 6.21 (H) 0.44 - 1.00 mg/dL   Calcium 8.2 (L) 8.9 - 10.3 mg/dL   Total Protein 6.3 (L) 6.5 - 8.1 g/dL   Albumin 3.4 (L) 3.5 - 5.0 g/dL   AST 31 15 - 41 U/L   ALT 14 0 - 44 U/L   Alkaline Phosphatase 48 38 - 126 U/L   Total Bilirubin 0.9 0.3 - 1.2 mg/dL   GFR, Estimated 42 (L) >60 mL/min    Comment: (NOTE) Calculated using the CKD-EPI Creatinine Equation (2021)    Anion gap 8 5 - 15    Comment: Performed at [facility], [address] [place], [place] [address]  Troponin I (High Sensitivity)     Status: None   Collection Time: 12/17/22 10:11 PM  Result  Value Ref Range   Troponin I (High Sensitivity) 6 <18 ng/L    Comment: (NOTE) Elevated high sensitivity troponin I (hsTnI) values and significant  changes across serial measurements may suggest ACS but many other  chronic and acute conditions are known to elevate hsTnI results.  Refer to the "Links" section for chest pain algorithms and additional  guidance. Performed at [facility], [address] [place], [place] [address]   Brain natriuretic peptide     Status: Abnormal   Collection Time: 12/17/22 10:11 PM  Result Value Ref Range   B Natriuretic Peptide 229.6 (H) 0.0 - 100.0 pg/mL    Comment: Performed at [facility], [address] [place], [place] [address]  Procalcitonin     Status: None   Collection Time: 12/17/22 10:11 PM  Result Value Ref Range   Procalcitonin 5.76 ng/mL    Comment:        Interpretation: PCT > 2 ng/mL: Systemic infection (sepsis) is likely, unless other causes are known. (NOTE)       Sepsis PCT Algorithm           Lower Respiratory Tract                                      Infection PCT Algorithm    ----------------------------     ----------------------------         PCT < 0.25 ng/mL                PCT < 0.10 ng/mL          Strongly encourage             Strongly discourage   discontinuation of antibiotics    initiation of antibiotics    ----------------------------     -----------------------------       PCT 0.25 - 0.50 ng/mL            PCT 0.10 - 0.25 ng/mL               OR       >80% decrease in PCT            Discourage initiation of                                            antibiotics      Encourage discontinuation           of antibiotics    ----------------------------     -----------------------------         PCT >= 0.50 ng/mL              PCT 0.26 - 0.50 ng/mL               AND       <80% decrease in PCT              Encourage initiation of                                             antibiotics        Encourage continuation           of antibiotics    ----------------------------     -----------------------------        PCT >= 0.50 ng/mL  PCT > 0.50 ng/mL               AND         increase in PCT                  Strongly encourage                                      initiation of antibiotics    Strongly encourage escalation           of antibiotics                                     -----------------------------                                           PCT <= 0.25 ng/mL                                                 OR                                        > 80% decrease in PCT                                      Discontinue / Do not initiate                                             antibiotics  Performed at [facility], [address] [place], [place] [address]   Resp panel by RT-PCR (RSV, Flu A&B, Covid) Anterior Nasal Swab     Status: None   Collection Time: 12/17/22 10:13 PM   Specimen: Anterior Nasal Swab  Result Value Ref Range   SARS Coronavirus 2 by RT PCR NEGATIVE NEGATIVE    Comment: (NOTE) SARS-CoV-2 target nucleic acids are NOT DETECTED.  The SARS-CoV-2 RNA is generally detectable in upper respiratory specimens during the acute phase of infection. The lowest concentration of SARS-CoV-2 viral copies this assay can detect is 138 copies/mL. A negative result does not preclude SARS-Cov-2 infection and should not be used as the sole basis for treatment or other patient management decisions. A negative result may occur with  improper specimen collection/handling, submission of specimen other than nasopharyngeal swab, presence of viral mutation(s) within the areas targeted by this assay, and inadequate number of viral copies(<138 copies/mL). A negative result must be combined with clinical observations, patient history, and epidemiological information. The expected result is Negative.  Fact Sheet for Patients:   BloggerCourse.com  Fact Sheet for Healthcare Providers:  SeriousBroker.it  This test is no t yet approved or cleared by the [place] FDA and  has been authorized for detection and/or diagnosis of SARS-CoV-2 by FDA under an Emergency Use Authorization (  EUA). This EUA will remain  in effect (meaning this test can be used) for the duration of the COVID-19 declaration under Section 564(b)(1) of the Act, 21 U.S.C.section 360bbb-3(b)(1), unless the authorization is terminated  or revoked sooner.       Influenza A by PCR NEGATIVE NEGATIVE   Influenza B by PCR NEGATIVE NEGATIVE    Comment: (NOTE) The Xpert Xpress SARS-CoV-2/FLU/RSV plus assay is intended as an aid in the diagnosis of influenza from Nasopharyngeal swab specimens and should not be used as a sole basis for treatment. Nasal washings and aspirates are unacceptable for Xpert Xpress SARS-CoV-2/FLU/RSV testing.  Fact Sheet for Patients: BloggerCourse.com  Fact Sheet for Healthcare Providers: SeriousBroker.it  This test is not yet approved or cleared by the [place] FDA and has been authorized for detection and/or diagnosis of SARS-CoV-2 by FDA under an Emergency Use Authorization (EUA). This EUA will remain in effect (meaning this test can be used) for the duration of the COVID-19 declaration under Section 564(b)(1) of the Act, 21 U.S.C. section 360bbb-3(b)(1), unless the authorization is terminated or revoked.     Resp Syncytial Virus by PCR NEGATIVE NEGATIVE    Comment: (NOTE) Fact Sheet for Patients: BloggerCourse.com  Fact Sheet for Healthcare Providers: SeriousBroker.it  This test is not yet approved or cleared by the [place] FDA and has been authorized for detection and/or diagnosis of SARS-CoV-2 by FDA under an Emergency Use Authorization (EUA).  This EUA will remain in effect (meaning this test can be used) for the duration of the COVID-19 declaration under Section 564(b)(1) of the Act, 21 U.S.C. section 360bbb-3(b)(1), unless the authorization is terminated or revoked.  Performed at [facility], [address] [place], [place] [address]   Urinalysis, Routine w reflex microscopic -Urine, Clean Catch     Status: Abnormal   Collection Time: 12/17/22 11:26 PM  Result Value Ref Range   Color, Urine YELLOW (A) YELLOW   APPearance HAZY (A) CLEAR   Specific Gravity, Urine 1.011 1.005 - 1.030   pH 5.0 5.0 - 8.0   Glucose, UA NEGATIVE NEGATIVE mg/dL   Hgb urine dipstick NEGATIVE NEGATIVE   Bilirubin Urine NEGATIVE NEGATIVE   Ketones, ur NEGATIVE NEGATIVE mg/dL   Protein, ur NEGATIVE NEGATIVE mg/dL   Nitrite NEGATIVE NEGATIVE   Leukocytes,Ua MODERATE (A) NEGATIVE   RBC / HPF 0-5 0 - 5 RBC/hpf   WBC, UA 6-10 0 - 5 WBC/hpf   Bacteria, UA MANY (A) NONE SEEN   Squamous Epithelial / HPF 0-5 0 - 5 /HPF   WBC Clumps PRESENT    Mucus PRESENT    Hyaline Casts, UA PRESENT     Comment: Performed at [facility], [address] [place], [place] [address]  Culture, blood (routine x 2)     Status: None (Preliminary result)   Collection Time: 12/17/22 11:44 PM   Specimen: BLOOD  Result Value Ref Range   Specimen Description BLOOD RIGHT ASSIST CONTROL    Special Requests      BOTTLES DRAWN AEROBIC AND ANAEROBIC Blood Culture results may not be optimal due to an excessive volume of blood received in culture bottles   Culture      NO GROWTH < 24 HOURS Performed at [facility], [address] [place], [place] [address]    Report Status PENDING   Culture, blood (routine x 2)     Status: None (Preliminary result)   Collection Time: 12/17/22 11:46 PM   Specimen: BLOOD  Result Value Ref Range   Specimen Description BLOOD LEFT ASSIST CONTROL    Special Requests      BOTTLES DRAWN AEROBIC AND ANAEROBIC Blood  Culture adequate volume   Culture      NO GROWTH < 24 HOURS Performed at [facility], [address] [place], [place] [address]    Report Status PENDING   Lactic acid, plasma     Status: None   Collection Time: 12/17/22 11:46 PM  Result Value Ref Range   Lactic Acid, Venous 1.6 0.5 - 1.9 mmol/L    Comment: Performed at [facility], [address] [place], [place] [address]  Troponin I (High Sensitivity)     Status: None   Collection Time: 12/18/22 12:12 AM  Result Value Ref Range   Troponin I (High Sensitivity) 5 <18 ng/L    Comment: (NOTE) Elevated high sensitivity troponin I (hsTnI) values and significant  changes across serial measurements may suggest ACS but many other  chronic and acute conditions are known to elevate hsTnI results.  Refer to the "Links" section for chest pain algorithms and additional  guidance. Performed at [facility], [address] [place], [place] [address]   Basic metabolic panel     Status: Abnormal   Collection Time: 12/18/22  4:49 AM  Result Value Ref Range   Sodium 137 135 - 145 mmol/L   Potassium 4.4 3.5 - 5.1 mmol/L   Chloride 107 98 - 111 mmol/L   CO2 23 22 - 32 mmol/L   Glucose, Bld 95 70 - 99 mg/dL    Comment: Glucose reference range applies only to samples taken after fasting for at least 8 hours.   BUN 17 8 - 23 mg/dL   Creatinine, Ser 9.14 (H) 0.44 - 1.00 mg/dL   Calcium 7.4 (L) 8.9 - 10.3 mg/dL   GFR, Estimated 49 (L) >60 mL/min    Comment: (NOTE) Calculated using the CKD-EPI Creatinine Equation (2021)    Anion gap 7 5 - 15    Comment: Performed at [facility], [address] [place] [address] [place], [place] [address]  CBC     Status: Abnormal   Collection Time: 12/18/22  4:49 AM  Result Value Ref Range   WBC 14.9 (H) 4.0 - 10.5 K/uL   RBC 3.19 (L) 3.87 - 5.11 MIL/uL   Hemoglobin 10.0 (L) 12.0 - 15.0 g/dL   HCT 62.1 (L) 30.8 - 65.7 %   MCV 95.9 80.0 - 100.0 fL   MCH 31.3 26.0 - 34.0 pg   MCHC  32.7 30.0 - 36.0 g/dL   RDW 84.6 96.2 - 95.2 %   Platelets 128 (L) 150 - 400 K/uL   nRBC 0.0 0.0 - 0.2 %    Comment: Performed at [facility], [address] [place], [place] [address]   CT Angio Chest Pulmonary Embolism (PE) W or WO Contrast  Result Date: 12/18/2022 CLINICAL DATA:  Pulmonary embolism.  Weakness. EXAM: CT ANGIOGRAPHY CHEST WITH CONTRAST TECHNIQUE: Multidetector CT imaging of the chest was performed using the standard protocol during bolus administration of intravenous contrast. Multiplanar CT image reconstructions and MIPs were obtained to evaluate the vascular anatomy. RADIATION DOSE REDUCTION: This exam was performed according to the departmental dose-optimization program which includes automated exposure control, adjustment of the mA and/or kV according to patient size and/or use of iterative reconstruction technique. CONTRAST:  75mL OMNIPAQUE IOHEXOL 350 MG/ML SOLN COMPARISON:  Chest x-ray 12/17/2022 earlier. Noncontrast chest CT 04/14/2014 FINDINGS: Cardiovascular:  No pulmonary embolism identified. There is some enlargement of the pulmonary arteries. Please correlate for any evidence of pulmonary artery hypertension. Heart is nonenlarged. No pericardial effusion. Coronary artery calcifications are seen. The thoracic aorta has a normal course and caliber with scattered atherosclerotic calcified plaque. There is significant plaque extending along the origin of the great vessels including brachiocephalic, bilateral subclavian arteries. Please correlate with any particular symptoms. Mediastinum/Nodes: Mildly patulous thoracic esophagus with a small hiatal hernia. No specific abnormal lymph node enlargement identified in the axillary region. There are some small bilateral hilar nodes. Example on the right measures 16 by 12 mm and left 15 x 13 mm. Lungs/Pleura: Centrilobular emphysematous lung changes are identified particularly along the upper lung zones. Bilateral upper lobe  areas of scarring and pleural thickening with calcification. Scattered areas of interstitial septal thickening. Additional areas of pleural thickening along the anterior left hemithorax. Trace bilateral pleural fluid with some adjacent presumed dependent atelectasis. Upper Abdomen: The adrenal glands are preserved in the upper abdomen. Please correlate with the recent CT scan of the abdomen and pelvis from earlier on 12/17/2022. Musculoskeletal: Scattered degenerative changes along the spine. Review of the MIP images confirms the above findings. IMPRESSION: No pulmonary embolism identified. There is some enlargement of the pulmonary arteries. Please correlate for evidence of pulmonary artery hypertension. Chronic lung changes with centrilobular emphysematous change. There is significant apical pleural thickening with calcification. Associated scarring and fibrotic changes as well. These changes are progressive from the study of 2015. Tiny pleural effusions. Few enlarged bilateral hilar lymph nodes. This is of uncertain etiology. Short follow-up or additional workup when clinically appropriate. Small hiatal hernia. Significant calcified plaque along the aorta and branch vessels including areas of stenosis suggested along the brachiocephalic artery and bilateral subclavian arteries. Please correlate with any particular symptoms and if needed additional CT angiogram as clinically appropriate Aortic Atherosclerosis (ICD10-I70.0) and Emphysema (ICD10-J43.9). Electronically Signed   By: Karen Kays M.D.   On: 12/18/2022 10:22   CT Head Wo Contrast  Result Date: 12/18/2022 CLINICAL DATA:  Altered mental status, weakness, dizziness EXAM: CT HEAD WITHOUT CONTRAST TECHNIQUE: Contiguous axial images were obtained from the base of the skull through the vertex without intravenous contrast. RADIATION DOSE REDUCTION: This exam was performed according to the departmental dose-optimization program which includes automated  exposure control, adjustment of the mA and/or kV according to patient size and/or use of iterative reconstruction technique. COMPARISON:  09/16/2022 FINDINGS: Brain: No evidence of acute infarction, hemorrhage, hydrocephalus, extra-axial collection or mass lesion/mass effect. Mild global cortical atrophy. Subcortical white matter and periventricular small vessel ischemic changes. Vascular: Intracranial atherosclerosis. Skull: Normal. Negative for fracture or focal lesion. Sinuses/Orbits: The visualized paranasal sinuses are essentially clear. The mastoid air cells are unopacified. Other: None. IMPRESSION: No acute intracranial abnormality. Mild atrophy with small vessel ischemic changes. Electronically Signed   By: Charline Bills M.D.   On: 12/18/2022 00:11   CT ABDOMEN PELVIS WO CONTRAST  Result Date: 12/18/2022 CLINICAL DATA:  Diarrhea, weakness EXAM: CT ABDOMEN AND PELVIS WITHOUT CONTRAST TECHNIQUE: Multidetector CT imaging of the abdomen and pelvis was performed following the standard protocol without IV contrast. RADIATION DOSE REDUCTION: This exam was performed according to the departmental dose-optimization program which includes automated exposure control, adjustment of the mA and/or kV according to patient size and/or use of iterative reconstruction technique. COMPARISON:  CTA abdomen/pelvis dated 06/23/2008 FINDINGS: Lower chest: Lung bases are clear. Hepatobiliary: Unenhanced liver is unremarkable. Mildly distended gallbladder. No intrahepatic or extrahepatic  duct dilatation. Pancreas: Within normal limits. Spleen: Within normal limits. Adrenals/Urinary Tract: Adrenal glands are within normal limits. Right renal cortical atrophy. 2.0 cm left upper pole renal cyst (series 2/image 24), measuring simple fluid density, benign (Bosniak I). No follow-up is recommended. Renal vascular calcifications. No hydronephrosis. Bladder is within normal limits. Stomach/Bowel: Stomach is notable for a tiny hiatal  hernia. No evidence of bowel obstruction. Appendix is not discretely visualized. Segmental wall thickening involving the transverse colon (series 2/image 44), suggesting infectious/inflammatory colitis. No pneumatosis or free air. 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 Atherosclerotic calcifications of the abdominal aorta and branch vessels. Left renal stent. No suspicious abdominopelvic lymphadenopathy. Reproductive: Uterus is grossly unremarkable. Bilateral ovaries are within normal limits. Other: Suspected trace pelvic ascites (series 2/image 67). Musculoskeletal: Degenerative changes of the visualized thoracolumbar spine. IMPRESSION: Segmental wall thickening involving the transverse colon, suggesting infectious/inflammatory colitis. Additional ancillary findings as above. Electronically Signed   By: Charline Bills M.D.   On: 12/18/2022 00:08   DG Chest 1 View  Result Date: 12/17/2022 CLINICAL DATA:  Weakness. EXAM: CHEST  1 VIEW COMPARISON:  10/17/2015 FINDINGS: Lung volumes are low. Bronchovascular crowding versus vascular congestion. The heart is normal in size. Aortic atherosclerosis. No pneumothorax or pleural effusion. No acute osseous findings. IMPRESSION: Low lung volumes with bronchovascular crowding versus vascular congestion. Electronically Signed   By: Narda Rutherford M.D.   On: 12/17/2022 22:50    Pending Labs Unresulted Labs (From admission, onward)     Start     Ordered   12/18/22 1256  Urinalysis, w/ Reflex to Culture (Infection Suspected) -Urine, Clean Catch  (Urine Labs)  Once,   R       Question:  Specimen Source  Answer:  Urine, Clean Catch   12/18/22 1255   12/17/22 2331  Urine Culture  Once,   URGENT       Question:  Indication  Answer:  Sepsis   12/17/22 2330   12/17/22 2331  C Difficile Quick Screen w PCR reflex  (C Difficile quick screen w PCR reflex panel )  Once, for 24 hours,   URGENT       References:    CDiff  Information Tool   12/17/22 2330   12/17/22 2331  Gastrointestinal Panel by PCR , Stool  (Gastrointestinal Panel by PCR, Stool                                                                                                                                                     **Does Not include CLOSTRIDIUM DIFFICILE testing. **If CDIFF testing is needed, place order from the "C Difficile Testing" order set.**)  Once,   URGENT        12/17/22 2330            Vitals/Pain Today's  Vitals   12/18/22 0922 12/18/22 0930 12/18/22 1251 12/18/22 1252  BP: (!) 108/58 (!) 108/43  (!) 99/56  Pulse: 79 77  76  Resp: 18 16  17   Temp: 98.1 F (36.7 C)   98.1 F (36.7 C)  TempSrc: Oral   Oral  SpO2: 96% 93%  97%  Weight:      Height:      PainSc:   3      Isolation Precautions Enteric precautions (UV disinfection)  Medications Medications  HYDROcodone-acetaminophen (NORCO/VICODIN) 5-325 MG per tablet 1 tablet (1 tablet Oral Given 12/18/22 1047)  anastrozole (ARIMIDEX) tablet 1 mg (1 mg Oral Given 12/18/22 1046)  carvedilol (COREG) tablet 6.25 mg (6.25 mg Oral Given 12/18/22 0821)  furosemide (LASIX) tablet 40 mg (has no administration in time range)  hydrALAZINE (APRESOLINE) tablet 50 mg (50 mg Oral Given 12/18/22 1047)  mirtazapine (REMERON) tablet 15 mg (has no administration in time range)  levothyroxine (SYNTHROID) tablet 88 mcg (88 mcg Oral Given 12/18/22 0820)  pantoprazole (PROTONIX) EC tablet 40 mg (40 mg Oral Given 12/18/22 1048)  cyanocobalamin (VITAMIN B12) tablet 1,000 mcg (1,000 mcg Oral Given 12/18/22 1048)  gabapentin (NEURONTIN) capsule 600 mg (has no administration in time range)  calcium-vitamin D (OSCAL WITH D) 500-5 MG-MCG per tablet 1 tablet (1 tablet Oral Given 12/18/22 1320)  potassium chloride SA (KLOR-CON M) CR tablet 20 mEq (has no administration in time range)  fluticasone (FLONASE) 50 MCG/ACT nasal spray 1-2 spray (has no administration in time range)  azelastine  (ASTELIN) 0.1 % nasal spray 2 spray (has no administration in time range)  enoxaparin (LOVENOX) injection 42.5 mg (42.5 mg Subcutaneous Given 12/18/22 1044)  0.9 %  sodium chloride infusion ( Intravenous New Bag/Given 12/18/22 0451)  acetaminophen (TYLENOL) tablet 650 mg (has no administration in time range)    Or  acetaminophen (TYLENOL) suppository 650 mg (has no administration in time range)  traZODone (DESYREL) tablet 25 mg (has no administration in time range)  ondansetron (ZOFRAN) tablet 4 mg (has no administration in time range)    Or  ondansetron (ZOFRAN) injection 4 mg (has no administration in time range)  cefTRIAXone (ROCEPHIN) 2 g in sodium chloride 0.9 % 100 mL IVPB (has no administration in time range)  metroNIDAZOLE (FLAGYL) IVPB 500 mg (0 mg Intravenous Stopped 12/18/22 1251)  albuterol (PROVENTIL) (2.5 MG/3ML) 0.083% nebulizer solution 2.5 mg (has no administration in time range)  sodium chloride 0.9 % bolus 1,000 mL (0 mLs Intravenous Stopped 12/18/22 0036)  ondansetron (ZOFRAN) injection 4 mg (4 mg Intravenous Given 12/17/22 2339)  morphine (PF) 2 MG/ML injection 2 mg (2 mg Intravenous Given 12/17/22 2341)  sodium chloride 0.9 % bolus 1,000 mL (0 mLs Intravenous Stopped 12/18/22 0134)    And  sodium chloride 0.9 % bolus 1,000 mL (0 mLs Intravenous Stopped 12/18/22 0214)  cefTRIAXone (ROCEPHIN) 2 g in sodium chloride 0.9 % 100 mL IVPB (0 g Intravenous Stopped 12/18/22 0134)  metroNIDAZOLE (FLAGYL) IVPB 500 mg (0 mg Intravenous Stopped 12/18/22 0214)  iohexol (OMNIPAQUE) 350 MG/ML injection 75 mL (75 mLs Intravenous Contrast Given 12/18/22 0959)    Mobility walks     Focused Assessments     R Recommendations: See Admitting Provider Note  Report given to:   Additional Notes:

## 2022-12-18 NOTE — Assessment & Plan Note (Signed)
-   We will continue her Lexapro and Remeron.

## 2022-12-18 NOTE — Sepsis Progress Note (Signed)
Sepsis protocol is being followed by [facility]. 

## 2022-12-18 NOTE — Assessment & Plan Note (Signed)
-   This should be With IV Rocephin. - Urine culture will be obtained.

## 2022-12-19 DIAGNOSIS — K529 Noninfective gastroenteritis and colitis, unspecified: Secondary | ICD-10-CM | POA: Diagnosis not present

## 2022-12-19 LAB — GASTROINTESTINAL PANEL BY PCR, STOOL (REPLACES STOOL CULTURE)

## 2022-12-19 LAB — C DIFFICILE QUICK SCREEN W PCR REFLEX
C Diff antigen: NEGATIVE
C Diff interpretation: NOT DETECTED
C Diff toxin: NEGATIVE

## 2022-12-19 LAB — URINE CULTURE: Culture: >=100000 — AB

## 2022-12-19 NOTE — Progress Notes (Signed)
[facility]  - [place] [facility]   PATIENT NAME: Tamara Finley    MR#:  [id number]  DATE OF BIRTH:  [birth year]  SUBJECTIVE:  patient seen earlier. No family at bedside. Came in with atypical chest pain, diarrhea, generalized weakness, poor appetite. Found to have acute colitis and UTI. Continues to feel weak. No fever. Tolerating some diet. Patient denies any chest pain  VITALS:  Blood pressure (!) 149/59, pulse 79, temperature 97.7 F (36.5 C), resp. rate 19, height  (1.6 m), weight 86.2 kg, SpO2 96 %.  PHYSICAL EXAMINATION:   GENERAL:  82 y.o.-year-old patient with no acute distress.  LUNGS: Normal breath sounds bilaterally, no wheezing CARDIOVASCULAR: S1, S2 normal. No murmur   ABDOMEN: Soft, nontender, nondistended. Bowel sounds present.  EXTREMITIES: No  edema b/l.    NEUROLOGIC: nonfocal  patient is alert and awake SKIN: No obvious rash, lesion, or ulcer.   LABORATORY PANEL:  CBC Recent Labs  Lab 12/18/22 0449  WBC 14.9*  HGB 10.0*  HCT 30.6*  PLT 128*    Chemistries  Recent Labs  Lab 12/17/22 2211 12/18/22 0449  NA 134* 137  K 4.2 4.4  CL 102 107  CO2 24 23  GLUCOSE 98 95  BUN 21 17  CREATININE 1.29* 1.13*  CALCIUM 8.2* 7.4*  AST 31  --   ALT 14  --   ALKPHOS 48  --   BILITOT 0.9  --    Cardiac Enzymes No results for input(s): "TROPONINI" in the last 168 hours. RADIOLOGY:  CT Angio Chest Pulmonary Embolism (PE) W or WO Contrast  Result Date: 12/18/2022 CLINICAL DATA:  Pulmonary embolism.  Weakness. EXAM: CT ANGIOGRAPHY CHEST WITH CONTRAST TECHNIQUE: Multidetector CT imaging of the chest was performed using the standard protocol during bolus administration of intravenous contrast. Multiplanar CT image reconstructions and MIPs were obtained to evaluate the vascular anatomy. RADIATION DOSE REDUCTION: This exam was performed according to the departmental dose-optimization program which includes automated exposure control,  adjustment of the mA and/or kV according to patient size and/or use of iterative reconstruction technique. CONTRAST:  75mL OMNIPAQUE IOHEXOL 350 MG/ML SOLN COMPARISON:  Chest x-ray 12/17/2022 earlier. Noncontrast chest CT 04/14/2014 FINDINGS: Cardiovascular: No pulmonary embolism identified. There is some enlargement of the pulmonary arteries. Please correlate for any evidence of pulmonary artery hypertension. Heart is nonenlarged. No pericardial effusion. Coronary artery calcifications are seen. The thoracic aorta has a normal course and caliber with scattered atherosclerotic calcified plaque. There is significant plaque extending along the origin of the great vessels including brachiocephalic, bilateral subclavian arteries. Please correlate with any particular symptoms. Mediastinum/Nodes: Mildly patulous thoracic esophagus with a small hiatal hernia. No specific abnormal lymph node enlargement identified in the axillary region. There are some small bilateral hilar nodes. Example on the right measures 16 by 12 mm and left 15 x 13 mm. Lungs/Pleura: Centrilobular emphysematous lung changes are identified particularly along the upper lung zones. Bilateral upper lobe areas of scarring and pleural thickening with calcification. Scattered areas of interstitial septal thickening. Additional areas of pleural thickening along the anterior left hemithorax. Trace bilateral pleural fluid with some adjacent presumed dependent atelectasis. Upper Abdomen: The adrenal glands are preserved in the upper abdomen. Please correlate with the recent CT scan of the abdomen and pelvis from earlier on 12/17/2022. Musculoskeletal: Scattered degenerative changes along the spine. Review of the MIP images confirms the above findings. IMPRESSION: No pulmonary embolism identified. There is some enlargement of the pulmonary arteries. Please  correlate for evidence of pulmonary artery hypertension. Chronic lung changes with centrilobular  emphysematous change. There is significant apical pleural thickening with calcification. Associated scarring and fibrotic changes as well. These changes are progressive from the study of 2015. Tiny pleural effusions. Few enlarged bilateral hilar lymph nodes. This is of uncertain etiology. Short follow-up or additional workup when clinically appropriate. Small hiatal hernia. Significant calcified plaque along the aorta and branch vessels including areas of stenosis suggested along the brachiocephalic artery and bilateral subclavian arteries. Please correlate with any particular symptoms and if needed additional CT angiogram as clinically appropriate Aortic Atherosclerosis (ICD10-I70.0) and Emphysema (ICD10-J43.9). Electronically Signed   By: Karen Kays M.D.   On: 12/18/2022 10:22   CT Head Wo Contrast  Result Date: 12/18/2022 CLINICAL DATA:  Altered mental status, weakness, dizziness EXAM: CT HEAD WITHOUT CONTRAST TECHNIQUE: Contiguous axial images were obtained from the base of the skull through the vertex without intravenous contrast. RADIATION DOSE REDUCTION: This exam was performed according to the departmental dose-optimization program which includes automated exposure control, adjustment of the mA and/or kV according to patient size and/or use of iterative reconstruction technique. COMPARISON:  09/16/2022 FINDINGS: Brain: No evidence of acute infarction, hemorrhage, hydrocephalus, extra-axial collection or mass lesion/mass effect. Mild global cortical atrophy. Subcortical white matter and periventricular small vessel ischemic changes. Vascular: Intracranial atherosclerosis. Skull: Normal. Negative for fracture or focal lesion. Sinuses/Orbits: The visualized paranasal sinuses are essentially clear. The mastoid air cells are unopacified. Other: None. IMPRESSION: No acute intracranial abnormality. Mild atrophy with small vessel ischemic changes. Electronically Signed   By: Charline Bills M.D.   On:  12/18/2022 00:11   CT ABDOMEN PELVIS WO CONTRAST  Result Date: 12/18/2022 CLINICAL DATA:  Diarrhea, weakness EXAM: CT ABDOMEN AND PELVIS WITHOUT CONTRAST TECHNIQUE: Multidetector CT imaging of the abdomen and pelvis was performed following the standard protocol without IV contrast. RADIATION DOSE REDUCTION: This exam was performed according to the departmental dose-optimization program which includes automated exposure control, adjustment of the mA and/or kV according to patient size and/or use of iterative reconstruction technique. COMPARISON:  CTA abdomen/pelvis dated 06/23/2008 FINDINGS: Lower chest: Lung bases are clear. Hepatobiliary: Unenhanced liver is unremarkable. Mildly distended gallbladder. No intrahepatic or extrahepatic duct dilatation. Pancreas: Within normal limits. Spleen: Within normal limits. Adrenals/Urinary Tract: Adrenal glands are within normal limits. Right renal cortical atrophy. 2.0 cm left upper pole renal cyst (series 2/image 24), measuring simple fluid density, benign (Bosniak I). No follow-up is recommended. Renal vascular calcifications. No hydronephrosis. Bladder is within normal limits. Stomach/Bowel: Stomach is notable for a tiny hiatal hernia. No evidence of bowel obstruction. Appendix is not discretely visualized. Segmental wall thickening involving the transverse colon (series 2/image 44), suggesting infectious/inflammatory colitis. No pneumatosis or free air. 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 Atherosclerotic calcifications of the abdominal aorta and branch vessels. Left renal stent. No suspicious abdominopelvic lymphadenopathy. Reproductive: Uterus is grossly unremarkable. Bilateral ovaries are within normal limits. Other: Suspected trace pelvic ascites (series 2/image 67). Musculoskeletal: Degenerative changes of the visualized thoracolumbar spine. IMPRESSION: Segmental wall thickening involving the transverse  colon, suggesting infectious/inflammatory colitis. Additional ancillary findings as above. Electronically Signed   By: Charline Bills M.D.   On: 12/18/2022 00:08   DG Chest 1 View  Result Date: 12/17/2022 CLINICAL DATA:  Weakness. EXAM: CHEST  1 VIEW COMPARISON:  10/17/2015 FINDINGS: Lung volumes are low. Bronchovascular crowding versus vascular congestion. The heart is normal in size. Aortic atherosclerosis. No pneumothorax or pleural  effusion. No acute osseous findings. IMPRESSION: Low lung volumes with bronchovascular crowding versus vascular congestion. Electronically Signed   By: Narda Rutherford M.D.   On: 12/17/2022 22:50    Assessment and Plan  ELAYSIA DEVARGAS is a 82 y.o. female with medical history significant for multiple medical problems that are mentioned below including type 2 diabetes mellitus, GERD, COPD, CKD 3B, coronary artery disease and AAA and TIA, who presented to the emergency room with acute onset of generalized weakness for the last day with associated right-sided chest pain felt as pressure increasing with movement as well as nausea and diarrhea with no vomiting.  She admits to urinary frequency and urgency without significant dysuria   Abdominal pelvic CT scan revealed segmental wall thickening involving the transverse colon suggesting infectious/inflammatory colitis.   Acute colitis - continue antibiotic therapy with IV Rocephin and Flagyl. - Stool G.I. PCR negative. C. diff negative.  -  IV normal saline.   Acute lower UTI-- E. coli - This should be With IV Rocephin. - Urine culture showed E. Coli  Generalized weakness - This is nearly secondary to #1 #2. --PT/OT to see patient   History of breast cancer - continue Arimidex.   Hypothyroidism - Continue Synthroid.   Essential hypertension -  continue her antihypertensives.   Dyslipidemia -continue statin therapy.   Depression - continue her Lexapro and Remeron.    Procedures:none Family  communication :dter Boyd Kerbs on the phone Consults :none CODE STATUS: FULL DVT Prophylaxis :enoxaparin Level of care: Telemetry Cardiac Status is: Inpatient Remains inpatient appropriate because: colitis    TOTAL TIME TAKING CARE OF THIS PATIENT: 35 minutes.  >50% time spent on counselling and coordination of care  Note: This dictation was prepared with Dragon dictation along with smaller phrase technology. Any transcriptional errors that result from this process are unintentional.  Enedina Finner M.D    [facility]   CC: Primary care physician; Lynnea Ferrier, MD

## 2022-12-19 NOTE — Progress Notes (Signed)
  Transition of Care [facility]) Screening Note   Patient Details  Name: AMARISE LILLO Date of Birth: Dec 03, 1940   Transition of Care [facility]) CM/SW Contact:    Truddie Hidden, RN Phone Number: 12/19/2022, 2:29 PM    Transition of Care Department [facility]) has reviewed patient and no TOC needs have been identified at this time. We will continue to monitor patient advancement through interdisciplinary progression rounds. If new patient transition needs arise, please place a TOC consult.

## 2022-12-20 DIAGNOSIS — K529 Noninfective gastroenteritis and colitis, unspecified: Secondary | ICD-10-CM | POA: Diagnosis not present

## 2022-12-20 LAB — CULTURE, BLOOD (ROUTINE X 2)

## 2022-12-20 MED ORDER — FUROSEMIDE 40 MG PO TABS
40.0000 mg | ORAL_TABLET | Freq: Every day | ORAL | Status: DC
  Administered 2022-12-20 – 2022-12-21 (×2): 40 mg via ORAL
  Filled 2022-12-20 (×2): qty 1

## 2022-12-20 MED ORDER — LABETALOL HCL 5 MG/ML IV SOLN
10.0000 mg | Freq: Four times a day (QID) | INTRAVENOUS | Status: DC | PRN
  Administered 2022-12-20: 10 mg via INTRAVENOUS
  Filled 2022-12-20: qty 4

## 2022-12-20 NOTE — TOC Progression Note (Signed)
Transition of Care [facility]) - Progression Note    Patient Details  Name: Tamara Finley MRN: [id number] Date of Birth: 1941/05/06  Transition of Care [facility]) CM/SW Contact  Ashley Royalty Lutricia Feil, RN Phone Number: [phone] 12/20/2022, 3:40 PM  Clinical Narrative:    Received the request to set up [facility] for a potential discharge tomorrow. Spoke with the daughter Tamara Finley and pt concerning the recommendations. Pt states she has used [facility] in the past and request the same agencies for the recommended HHPT post discharge. Daughter has lots of questions concerning aide services and someone to continue caring for pt once home. Educated on [employer] services however pt may have an out-of-pocket of expense. Also offered [facility]) and daughter agreed to a referral for possible sitter services in the home. Also set up [facility] for HHPT who will be available to service patient on Monday if pt is discharged home at that time. Pt states she has a cane and RW to assist with her ongoing ambulation and denies any other DME needed.  Pt utilizes [facility] in [place] and has a sufficient amount of food and able to afford her ongoing medications.  REFERRAL: [facility] (Desiree/Kelsey) accepted for services-will services on Monday. [facility]) [phone] provided both [facility] the pt and daughter's contact number and provided the daughter Tamara Finley the agency's contact number for sitter services.  No other needs presented at this time. TOC remains available to assist.    Expected Discharge Plan: Home w Home Health Services Barriers to Discharge: No Barriers Identified  Expected Discharge Plan and Services   Discharge Planning Services: CM Consult   Living arrangements for the past 2 months: Single Family Home                           HH Arranged: PT HH Agency: [facility] Date HH Agency Contacted: 12/20/22 Time HH Agency Contacted: 1532 Representative spoke with at [facility] Agency:  i Cristie Hem and Adelina Mings   Social Determinants of Health (SDOH) Interventions SDOH Screenings   Food Insecurity: No Food Insecurity (12/18/2022)  Housing: Low Risk  (12/18/2022)  Transportation Needs: No Transportation Needs (12/18/2022)  Utilities: Not At Risk (12/18/2022)  Depression (PHQ2-9): Low Risk  (10/29/2021)  Tobacco Use: Medium Risk (12/18/2022)    Readmission Risk Interventions     No data to display

## 2022-12-20 NOTE — Evaluation (Signed)
Occupational Therapy Evaluation Patient Details Name: Tamara OSHANA MRN: [id number] DOB: 27-Oct-1940 Today's Date: 12/20/2022   History of Present Illness Pt is an 82 y/o F admitted on 12/17/22 after presenting with c/o acute onset of generalized weakness & R sided chest pain, as well as nausea & diarrhea. Pt admits to urinary frequency & urgency. Pt found to have lower UTI & acute colitis. PMH: DM2, GERD, COPD, CKD3B, CAD, AAA, TIA, anxiety, depression, breast CA, hypercholesterolemia, HTN, hypothyroidism, OA, stroke   Clinical Impression   Patient received for OT evaluation. See flowsheet below for details of function. Generally, patient MOD (I) for bed mobility, CGA-SBA with RW for functional mobility, and set up-MIN A for ADLs. Patient will benefit from continued OT while in acute care.     Recommendations for follow up therapy are one component of a multi-disciplinary discharge planning process, led by the attending physician.  Recommendations may be updated based on patient status, additional functional criteria and insurance authorization.   Assistance Recommended at Discharge Intermittent Supervision/Assistance  Patient can return home with the following A little help with bathing/dressing/bathroom;Assistance with cooking/housework;Assist for transportation;Help with stairs or ramp for entrance    Functional Status Assessment  Patient has had a recent decline in their functional status and demonstrates the ability to make significant improvements in function in a reasonable and predictable amount of time.  Equipment Recommendations  None recommended by OT    Recommendations for Other Services       Precautions / Restrictions Precautions Precautions: Fall Restrictions Weight Bearing Restrictions: No      Mobility Bed Mobility Overal bed mobility: Modified Independent Bed Mobility: Supine to Sit     Supine to sit: Modified independent (Device/Increase time), HOB  elevated     General bed mobility comments: use of bed rails, HOB elevated    Transfers Overall transfer level: Needs assistance Equipment used: Rolling walker (2 wheels) Transfers: Sit to/from Stand, Bed to chair/wheelchair/BSC Sit to Stand: Min guard     Step pivot transfers: Min guard, Supervision (with RW)            Balance                                           ADL either performed or assessed with clinical judgement   ADL Overall ADL's : Needs assistance/impaired     Grooming: Oral care;Wash/dry face;Brushing hair;Supervision/safety;Set up;Sitting Grooming Details (indicate cue type and reason): Pt sat on BSC in front of sink for grooming due to decreased activity tolerance. Pt needing intermittent rest breaks due to fatigue even when seated. Pt also applied lotion to face.                 Toilet Transfer: Chemical engineer (2 wheels) Toilet Transfer Details (indicate cue type and reason): BSC next to bed; cues for hand placement Toileting- Clothing Manipulation and Hygiene: Supervision/safety;Sit to/from stand (with RW) Toileting - Clothing Manipulation Details (indicate cue type and reason): Pt able to perform toilet hygiene with set up and supervision standing at [facility] with RW.     Functional mobility during ADLs: Rolling walker (2 wheels);Supervision/safety;Min guard General ADL Comments: Pt progressing to SBA mobility with RW in room; OT managing O2 line and IV pole/line. Pt's activity tolerance is well below baseline.     Vision         Perception  Praxis      Pertinent Vitals/Pain Pain Assessment Pain Assessment: No/denies pain     Hand Dominance Right   Extremity/Trunk Assessment Upper Extremity Assessment Upper Extremity Assessment: Generalized weakness   Lower Extremity Assessment Lower Extremity Assessment: Generalized weakness   Cervical / Trunk Assessment Cervical / Trunk Assessment:  Kyphotic   Communication Communication Communication: No difficulties   Cognition Arousal/Alertness: Awake/alert Behavior During Therapy: WFL for tasks assessed/performed Overall Cognitive Status: Within Functional Limits for tasks assessed                                 General Comments: Very pleasant and motivated.     General Comments  Walked from EOB to sink (approx 10 feet) then sat on BSC for grooming in front of sink 2/2 decreased activity tolerance. Pt on room air when OT arrived; NT giving pt a bath; OT checked O2 saturation after pt t/f to EOB, 85% on RA. OT applied 1L O2; after a minute O2 rose to 94%. After t/f to [facility], 90%. Mobilized to sink CGA-SBA with RW.    Exercises     Shoulder Instructions      Home Living Family/patient expects to be discharged to:: Private residence Living Arrangements: Alone Available Help at Discharge: Family;Available PRN/intermittently Type of Home: House Home Access: Stairs to enter [employer] of Steps: 2 Entrance Stairs-Rails: Right;Left;Can reach both Home Layout: One level     Bathroom Shower/Tub: Producer, television/film/video: Handicapped height Bathroom Accessibility: Yes How Accessible: Accessible via walker Home Equipment: Rolling Walker (2 wheels);Cane - single point;Shower seat;Hand held shower head   Additional Comments: States her daughter lives nearby      Prior Functioning/Environment Prior Level of Function : Independent/Modified Independent             Mobility Comments: Pt reports she's ambulatory with cane, denies falls, doesn't drive (daughter transports pt) ADLs Comments: Pt states she is MOD (I) with ADLs using cane, no O2 at home, stands for grooming, has shower seat. Daughter drives, but pt does laundry independently; has house cleaner routinely. Enjoys church activities on Sunday and Wednesday mornings; gets a ride from family/friends to those activities.        OT  Problem List: Decreased activity tolerance;Impaired balance (sitting and/or standing);Decreased knowledge of use of DME or AE      OT Treatment/Interventions: Self-care/ADL training;Therapeutic exercise;Therapeutic activities;Patient/family education    OT Goals(Current goals can be found in the care plan section) Acute Rehab OT Goals Patient Stated Goal: Feel better; go home OT Goal Formulation: With patient Time For Goal Achievement: 01/03/23 Potential to Achieve Goals: Good ADL Goals Pt Will Perform Grooming: with modified independence;standing Pt Will Perform Lower Body Bathing: with modified independence;sit to/from stand Pt Will Transfer to Toilet: with modified independence;ambulating Pt Will Perform Toileting - Clothing Manipulation and hygiene: with modified independence;sit to/from stand Pt Will Perform Tub/Shower Transfer: Shower transfer;with modified independence;ambulating;shower seat  OT Frequency: Min 2X/week    Co-evaluation              AM-PAC OT "6 Clicks" Daily Activity     Outcome Measure Help from another person eating meals?: None Help from another person taking care of personal grooming?: A Little Help from another person toileting, which includes using toliet, bedpan, or urinal?: A Little Help from another person bathing (including washing, rinsing, drying)?: A Little Help from another person to put on  and taking off regular upper body clothing?: None Help from another person to put on and taking off regular lower body clothing?: None 6 Click Score: 21   End of Session Equipment Utilized During Treatment: Rolling walker (2 wheels);Oxygen;Other (comment) (BSC) Nurse Communication: Mobility status  Activity Tolerance: Patient limited by fatigue;Patient tolerated treatment well Patient left: in bed;with call bell/phone within reach;with bed alarm set  OT Visit Diagnosis: Muscle weakness (generalized) (M62.81)                Time: 4098-1191 OT Time  Calculation (min): 35 min Charges:  OT General Charges $OT Visit: 1 Visit OT Evaluation $OT Eval Moderate Complexity: 1 Mod OT Treatments $Self Care/Home Management : 8-22 mins  Linward Foster, MS, OTR/L  Alvester Morin 12/20/2022, 3:36 PM

## 2022-12-20 NOTE — Progress Notes (Signed)
Mobility Specialist - Progress Note  During mobility: SpO2 (80) Post-mobility:SPO2(94)     12/20/22 1641  Mobility  Activity Ambulated with assistance in room;Transferred to/from BSC;Dangled on edge of bed  Level of Assistance Minimal assist, patient does 75% or more  Assistive Device None  Distance Ambulated (ft) 3 ft  Range of Motion/Exercises Active  Activity Response Tolerated well  Mobility Referral Yes  $Mobility charge 1 Mobility   Authore responded to bed alarm, Pt OOB transferred to [facility] by family member due to emergent need to pee. Pt desat to 80% due to O2 removal during transfer, pt recovered to 95% after 1L reapplied and rest 1-2 minutes. Pt returned to bed and left with needs in reach and bed alarm activated.   Johnathan Hausen Mobility Specialist 12/20/22, 4:45 PM

## 2022-12-20 NOTE — Evaluation (Signed)
Physical Therapy Evaluation Patient Details Name: Tamara Finley MRN: [id number] DOB: 02-Sep-1940 Today's Date: 12/20/2022  History of Present Illness  Pt is an 82 y/o F admitted on 12/17/22 after presenting with c/o acute onset of generalized weakness & R sided chest pain, as well as nausea & diarrhea. Pt admits to urinary frequency & urgency. Pt found to have lower UTI & acute colitis. PMH: DM2, GERD, COPD, CKD3B, CAD, AAA, TIA, anxiety, depression, breast CA, hypercholesterolemia, HTN, hypothyroidism, OA, stroke  Clinical Impression  Pt seen for PT evaluation with pt reporting prior to admission she was living alone in a 1 level home with 2 steps with B rails to enter, ambulating with cane. On this date, pt is able to complete bed mobility with mod I with hospital bed features, step pivot bed>recliner without AD with CGA. Pt ambulates into hallway with RW & supervision with slow gait speed, limited by SOB. Attempted to wean pt to room air but pt requires supplemental O2 at this time. Educated pt on recommendation of assistance from family at d/c with pt reporting she thinks daughter can assist some.  Pt received on 2L/min via nasal cannula SpO2 99% Weaned to room air & SpO2 92% or greater transferring to recliner SpO2 drops to 85% while ambulating on room air Pt recovered to >/= 90% on 1L/min with seated rest Pt left on 1L/min via nasal cannula      Recommendations for follow up therapy are one component of a multi-disciplinary discharge planning process, led by the attending physician.  Recommendations may be updated based on patient status, additional functional criteria and insurance authorization.  Follow Up Recommendations       Assistance Recommended at Discharge Intermittent Supervision/Assistance  Patient can return home with the following  A little help with walking and/or transfers;A little help with bathing/dressing/bathroom;Assistance with cooking/housework;Assist for  transportation;Help with stairs or ramp for entrance    Equipment Recommendations None recommended by PT (pt reports she already has a RW)  Recommendations for Other Services  OT consult    Functional Status Assessment Patient has had a recent decline in their functional status and demonstrates the ability to make significant improvements in function in a reasonable and predictable amount of time.     Precautions / Restrictions Precautions Precautions: Fall Restrictions Weight Bearing Restrictions: No      Mobility  Bed Mobility Overal bed mobility: Needs Assistance Bed Mobility: Supine to Sit     Supine to sit: Modified independent (Device/Increase time), HOB elevated     General bed mobility comments: use of bed rails, HOB elevated    Transfers Overall transfer level: Needs assistance Equipment used: None Transfers: Sit to/from Stand, Bed to chair/wheelchair/BSC Sit to Stand: Min guard   Step pivot transfers: Min guard       General transfer comment: STS from EOB without AD, step pivot bed>recliner without AD with CGA    Ambulation/Gait Ambulation/Gait assistance: Supervision Gait Distance (Feet): 25 Feet Assistive device: Rolling walker (2 wheels) Gait Pattern/deviations: Decreased step length - right, Decreased step length - left, Decreased stride length Gait velocity: decreased     General Gait Details: Limited by SOB & fatigue, SpO2 dropping  Stairs            Wheelchair Mobility    Modified Rankin (Stroke Patients Only)       Balance Overall balance assessment: Needs assistance   Sitting balance-Leahy Scale: Good     Standing balance support: During functional activity, Bilateral upper  extremity supported, Reliant on assistive device for balance Standing balance-Leahy Scale: Fair                               Pertinent Vitals/Pain Pain Assessment Pain Assessment: No/denies pain    Home Living Family/patient expects  to be discharged to:: Private residence Living Arrangements: Alone Available Help at Discharge: Family;Available PRN/intermittently Type of Home: House Home Access: Stairs to enter Entrance Stairs-Rails: Right;Left;Can reach both Entrance Stairs-Number of Steps: 2   Home Layout: One level Home Equipment: Agricultural consultant (2 wheels);Cane - single point      Prior Function Prior Level of Function : Independent/Modified Independent             Mobility Comments: Pt reports she's ambulatory with cane, denies falls, doesn't drive (daughter transports pt)       Hand Dominance        Extremity/Trunk Assessment   Upper Extremity Assessment Upper Extremity Assessment: Generalized weakness    Lower Extremity Assessment Lower Extremity Assessment: Generalized weakness    Cervical / Trunk Assessment Cervical / Trunk Assessment: Kyphotic  Communication   Communication: No difficulties  Cognition Arousal/Alertness: Awake/alert Behavior During Therapy: WFL for tasks assessed/performed Overall Cognitive Status: Within Functional Limits for tasks assessed                                          General Comments      Exercises     Assessment/Plan    PT Assessment Patient needs continued PT services  PT Problem List Decreased strength;Cardiopulmonary status limiting activity;Decreased activity tolerance;Decreased knowledge of use of DME;Decreased balance;Decreased mobility       PT Treatment Interventions DME instruction;Therapeutic exercise;Gait training;Balance training;Stair training;Neuromuscular re-education;Functional mobility training;Therapeutic activities;Patient/family education    PT Goals (Current goals can be found in the Care Plan section)  Acute Rehab PT Goals Patient Stated Goal: get better PT Goal Formulation: With patient Time For Goal Achievement: 01/03/23 Potential to Achieve Goals: Good    Frequency Min 3X/week      Co-evaluation               AM-PAC PT "6 Clicks" Mobility  Outcome Measure Help needed turning from your back to your side while in a flat bed without using bedrails?: None Help needed moving from lying on your back to sitting on the side of a flat bed without using bedrails?: A Little Help needed moving to and from a bed to a chair (including a wheelchair)?: A Little Help needed standing up from a chair using your arms (e.g., wheelchair or bedside chair)?: A Little Help needed to walk in hospital room?: A Little Help needed climbing 3-5 steps with a railing? : A Little 6 Click Score: 19    End of Session Equipment Utilized During Treatment: Oxygen Activity Tolerance: Patient limited by fatigue (limited 2/2 SOB) Patient left: in chair;with call bell/phone within reach Nurse Communication: Mobility status (O2) PT Visit Diagnosis: Muscle weakness (generalized) (M62.81);Difficulty in walking, not elsewhere classified (R26.2);Unsteadiness on feet (R26.81)    Time: 1610-9604 PT Time Calculation (min) (ACUTE ONLY): 12 min   Charges:   PT Evaluation $PT Eval Low Complexity: 1 Low          Aleda Grana, PT, DPT 12/20/22, 10:43 AM   Sandi Mariscal 12/20/2022, 10:41 AM

## 2022-12-20 NOTE — Progress Notes (Signed)
[facility]  - [place] [facility]   PATIENT NAME: Tamara Finley    MR#:  [id number]  DATE OF BIRTH:  05/09/1941  SUBJECTIVE:  patient seen earlier.  family at bedside. Came in with atypical chest pain, diarrhea, generalized weakness, poor appetite. Found to have acute colitis and UTI. Continues to feel weak. No fever. Tolerating some diet. Patient denies any chest pain. Patient was sitting out in the chair. Diarrhea slowdown. No fever. Worked with physical therapy  VITALS:  Blood pressure (!) 155/68, pulse 74, temperature 98.7 F (37.1 C), temperature source Oral, resp. rate 18, height  (1.6 m), weight 86.2 kg, SpO2 98 %.  PHYSICAL EXAMINATION:   GENERAL:  82 y.o.-year-old patient with no acute distress.  LUNGS: Normal breath sounds bilaterally, no wheezing CARDIOVASCULAR: S1, S2 normal. No murmur   ABDOMEN: Soft, nontender, nondistended. Bowel sounds present.  EXTREMITIES: No  edema b/l.    NEUROLOGIC: nonfocal  patient is alert and awake SKIN: No obvious rash, lesion, or ulcer.   LABORATORY PANEL:  CBC Recent Labs  Lab 12/18/22 0449  WBC 14.9*  HGB 10.0*  HCT 30.6*  PLT 128*     Chemistries  Recent Labs  Lab 12/17/22 2211 12/18/22 0449  NA 134* 137  K 4.2 4.4  CL 102 107  CO2 24 23  GLUCOSE 98 95  BUN 21 17  CREATININE 1.29* 1.13*  CALCIUM 8.2* 7.4*  AST 31  --   ALT 14  --   ALKPHOS 48  --   BILITOT 0.9  --     Cardiac Enzymes No results for input(s): "TROPONINI" in the last 168 hours. RADIOLOGY:  No results found.  Assessment and Plan  Tamara Finley is a 82 y.o. female with medical history significant for multiple medical problems that are mentioned below including type 2 diabetes mellitus, GERD, COPD, CKD 3B, coronary artery disease and AAA and TIA, who presented to the emergency room with acute onset of generalized weakness for the last day with associated right-sided chest pain felt as pressure increasing with movement as  well as nausea and diarrhea with no vomiting.  She admits to urinary frequency and urgency without significant dysuria   Abdominal pelvic CT scan revealed segmental wall thickening involving the transverse colon suggesting infectious/inflammatory colitis.   Acute colitis - continue antibiotic therapy with IV Rocephin and Flagyl. - Stool G.I. PCR negative. C. diff negative.  - Patient tolerating PO diet   Acute lower UTI-- E. coli - This should be With IV Rocephin. - Urine culture showed E. Coli  Generalized weakness - This is nearly secondary to #1 #2. --PT/OT to see patient-- home health PT   History of breast cancer - continue Arimidex.   Hypothyroidism - Continue Synthroid.   Essential hypertension -  continue her antihypertensives.   Dyslipidemia -continue statin therapy.   Depression - continue her Lexapro and Remeron.    Procedures:none Family communication : patient son and daughter-in-law at bedside Consults :none CODE STATUS: FULL DVT Prophylaxis :enoxaparin Level of care: Telemetry Cardiac Status is: Inpatient Remains inpatient appropriate because: colitis    TOTAL TIME TAKING CARE OF THIS PATIENT: 35 minutes.  >50% time spent on counselling and coordination of care  Note: This dictation was prepared with Dragon dictation along with smaller phrase technology. Any transcriptional errors that result from this process are unintentional.  Tamara Finley M.D    [facility]   CC: Primary care physician; Tamara Ferrier, MD

## 2022-12-21 DIAGNOSIS — J449 Chronic obstructive pulmonary disease, unspecified: Secondary | ICD-10-CM

## 2022-12-21 DIAGNOSIS — K529 Noninfective gastroenteritis and colitis, unspecified: Secondary | ICD-10-CM | POA: Diagnosis not present

## 2022-12-21 LAB — URINE CULTURE

## 2022-12-21 LAB — CULTURE, BLOOD (ROUTINE X 2)

## 2022-12-21 MED ORDER — ALBUTEROL SULFATE (2.5 MG/3ML) 0.083% IN NEBU: 2.5000 mg | INHALATION_SOLUTION | RESPIRATORY_TRACT | 2 refills | Status: AC | PRN

## 2022-12-21 MED ORDER — PREDNISONE 20 MG PO TABS: 20.0000 mg | ORAL_TABLET | Freq: Every day | ORAL | 0 refills | Status: AC

## 2022-12-21 MED ORDER — AMOXICILLIN-POT CLAVULANATE 875-125 MG PO TABS
1.0000 | ORAL_TABLET | Freq: Two times a day (BID) | ORAL | Status: DC
  Administered 2022-12-21: 1 via ORAL
  Filled 2022-12-21: qty 1

## 2022-12-21 MED ORDER — AMOXICILLIN-POT CLAVULANATE 875-125 MG PO TABS: 1.0000 | ORAL_TABLET | Freq: Two times a day (BID) | ORAL | 0 refills | Status: AC

## 2022-12-21 MED ORDER — PREDNISONE 20 MG PO TABS: 20.0000 mg | ORAL_TABLET | Freq: Every day | ORAL | Status: DC

## 2022-12-21 NOTE — Progress Notes (Signed)
Pt received on 2L/min via nasal cannula SpO2 99% Weaned to room air & SpO2 92% or greater transferring to recliner SpO2 drops to 85% while ambulating on room air Pt recovered to >/= 92% on 2 L /min with seated rest Pt left on 2 L/min via nasal cannula   Pt has COPD/Emphysema H/o smoking in the past

## 2022-12-21 NOTE — Discharge Summary (Signed)
Physician Discharge Summary   Patient: Tamara Finley MRN: [id number] DOB: Nov 19, 1940  Admit date:     12/17/2022  Discharge date: 12/21/22  Discharge Physician: Enedina Finner   PCP: Lynnea Ferrier, MD   Recommendations at discharge:    Use your oxygen, inhalers, nebulizer as instructed F/u PCP in 1-2 weeks  Discharge Diagnoses: Principal Problem:   Acute colitis Active Problems:   Acute lower UTI   Generalized weakness   Depression   Dyslipidemia   Essential hypertension   Hypothyroidism   History of breast cancer  Tamara Finley is a 82 y.o. female with medical history significant for multiple medical problems that are mentioned below including type 2 diabetes mellitus, GERD, COPD, CKD 3B, coronary artery disease and AAA and TIA, who presented to the emergency room with acute onset of generalized weakness for the last day with associated right-sided chest pain felt as pressure increasing with movement as well as nausea and diarrhea with no vomiting.  She admits to urinary frequency and urgency without significant dysuria    Abdominal pelvic CT scan revealed segmental wall thickening involving the transverse colon suggesting infectious/inflammatory colitis.    Acute colitis - continue antibiotic therapy with IV Rocephin and Flagyl.--change to Augmentin  - Stool G.I. PCR negative. C. diff negative.  - Patient tolerating PO diet --diarrhea resolved   Acute lower UTI-- E. coli - Urine culture showed E. Coli--on augmentin   Generalized weakness - secondary to #1 #2. --PT/OT to see patient-- home health PT  Chronic COPD with hypoxia on ambulation --DME nebulizer and oxygen --prednisone 20 mg qd for 5 days--empiric   History of breast cancer - per dter pt does NOT take Arimidex--d/ced   Hypothyroidism - Continue Synthroid.   Essential hypertension -  continue her antihypertensives.   Dyslipidemia -continue statin therapy.   Depression - continue her Lexapro and  Remeron.  Overall improving. Remains weak. HHPT/OT, aide D/c home. D/w dter Shelda Jakes     Procedures:none Family communication : patient's dter Boyd Kerbs Consults :none CODE STATUS: FULL DVT Prophylaxis :enoxaparin     Pain control - [place] Controlled Substance Reporting System database was reviewed. and patient was instructed, not to drive, operate heavy machinery, perform activities at heights, swimming or participation in water activities or provide baby-sitting services while on Pain, Sleep and Anxiety Medications; until their outpatient Physician has advised to do so again. Also recommended to not to take more than prescribed Pain, Sleep and Anxiety Medications.   Discharge Diet Orders (From admission, onward)     Start     Ordered   12/21/22 0000  Diet - low sodium heart healthy        12/21/22 0941            DISCHARGE MEDICATION: Allergies as of 12/21/2022       Reactions   Benadryl [diphenhydramine Hcl (sleep)] Other (See Comments)   BAD DREAMS   Alprazolam Rash   "bad dreams"        Medication List     STOP taking these medications    anastrozole 1 MG tablet Commonly known as: ARIMIDEX   aspirin 81 MG chewable tablet   DSS 100 MG Caps       TAKE these medications    acetaminophen 500 MG tablet Commonly known as: TYLENOL Take 1,000 mg by mouth every 8 (eight) hours as needed for moderate pain.   albuterol 108 (90 Base) MCG/ACT inhaler Commonly known as: VENTOLIN HFA Inhale 2  puffs into the lungs every 6 (six) hours as needed for wheezing or shortness of breath. Reported on 12/31/2015 What changed: Another medication with the same name was added. Make sure you understand how and when to take each.   albuterol (2.5 MG/3ML) 0.083% nebulizer solution Commonly known as: PROVENTIL Take 3 mLs (2.5 mg total) by nebulization every 4 (four) hours as needed for wheezing or shortness of breath. What changed: You were already taking a medication with the  same name, and this prescription was added. Make sure you understand how and when to take each.   amoxicillin-clavulanate 875-125 MG tablet Commonly known as: AUGMENTIN Take 1 tablet by mouth every 12 (twelve) hours for 5 days.   atorvastatin 40 MG tablet Commonly known as: LIPITOR Take 40 mg by mouth daily at 6 PM.   azelastine 0.1 % nasal spray Commonly known as: ASTELIN Place into both nostrils 2 (two) times daily. Use in each nostril as directed   Calcium 600+D Plus Minerals 600-400 MG-UNIT Tabs Take by mouth.   carvedilol 6.25 MG tablet Commonly known as: COREG Take 6.25 mg by mouth 2 (two) times daily with a meal.   cyanocobalamin 1000 MCG tablet Commonly known as: VITAMIN B12 Take 1 tablet by mouth 1 day or 1 dose.   escitalopram 5 MG tablet Commonly known as: LEXAPRO Take 5 mg by mouth daily.   fluticasone 50 MCG/ACT nasal spray Commonly known as: FLONASE Place 1-2 sprays into both nostrils daily as needed for rhinitis.   furosemide 40 MG tablet Commonly known as: LASIX Take 1 tablet by mouth 1 day or 1 dose.   gabapentin 300 MG capsule Commonly known as: NEURONTIN Take 2 capsules (600 mg total) by mouth at bedtime.   hydrALAZINE 50 MG tablet Commonly known as: APRESOLINE Take 50 mg by mouth 2 (two) times daily.   HYDROcodone-acetaminophen 5-325 MG tablet Commonly known as: NORCO/VICODIN Take 1 tablet by mouth 2 (two) times daily as needed for moderate pain. What changed: Another medication with the same name was removed. Continue taking this medication, and follow the directions you see here.   levothyroxine 88 MCG tablet Commonly known as: SYNTHROID Take 88 mcg by mouth daily before breakfast.   mirtazapine 15 MG tablet Commonly known as: REMERON Take 15 mg by mouth at bedtime.   pantoprazole 40 MG tablet Commonly known as: PROTONIX Take 40 mg by mouth daily.   potassium chloride SA 20 MEQ tablet Commonly known as: KLOR-CON M Take 20 mEq by  mouth as directed.   predniSONE 20 MG tablet Commonly known as: DELTASONE Take 1 tablet (20 mg total) by mouth daily with breakfast for 5 days. Start taking on: December 22, 2022               Durable Medical Equipment  (From admission, onward)           Start     Ordered   12/21/22 1610  For home use only DME Nebulizer machine  Once       Question Answer Comment  Patient needs a nebulizer to treat with the following condition COPD (chronic obstructive pulmonary disease)   Length of Need Lifetime      12/21/22 0937   12/21/22 0932  For home use only DME oxygen  Once       Question Answer Comment  Length of Need Lifetime   Mode or (Route) Nasal cannula   Liters per Minute 2   Frequency Continuous (stationary and portable  oxygen unit needed)   Oxygen conserving device Yes   Oxygen delivery system Gas      12/21/22 0931            Follow-up Information     Curtis Sites III, MD. Schedule an appointment as soon as possible for a visit in 1 week(s).   Specialty: Internal Medicine Why: hospital f/u Contact information: [address] [place] [address] [phone]                Discharge Exam: Ceasar Mons Weights   12/17/22 2208  Weight: 86.2 kg   GENERAL:  82 y.o.-year-old patient with no acute distress.  LUNGS: distant Breath sounds CARDIOVASCULAR: S1, S2 normal. No murmur   ABDOMEN: Soft, nontender, nondistended. Bowel sounds present.  EXTREMITIES: No  edema b/l.    NEUROLOGIC: nonfocal  patient is alert and awake SKIN: No obvious rash, lesion, or ulcer.   Condition at discharge: fair  The results of significant diagnostics from this hospitalization (including imaging, microbiology, ancillary and laboratory) are listed below for reference.   Imaging Studies: CT Angio Chest Pulmonary Embolism (PE) W or WO Contrast  Result Date: 12/18/2022 CLINICAL DATA:  Pulmonary embolism.  Weakness. EXAM: CT ANGIOGRAPHY CHEST  WITH CONTRAST TECHNIQUE: Multidetector CT imaging of the chest was performed using the standard protocol during bolus administration of intravenous contrast. Multiplanar CT image reconstructions and MIPs were obtained to evaluate the vascular anatomy. RADIATION DOSE REDUCTION: This exam was performed according to the departmental dose-optimization program which includes automated exposure control, adjustment of the mA and/or kV according to patient size and/or use of iterative reconstruction technique. CONTRAST:  75mL OMNIPAQUE IOHEXOL 350 MG/ML SOLN COMPARISON:  Chest x-ray 12/17/2022 earlier. Noncontrast chest CT 04/14/2014 FINDINGS: Cardiovascular: No pulmonary embolism identified. There is some enlargement of the pulmonary arteries. Please correlate for any evidence of pulmonary artery hypertension. Heart is nonenlarged. No pericardial effusion. Coronary artery calcifications are seen. The thoracic aorta has a normal course and caliber with scattered atherosclerotic calcified plaque. There is significant plaque extending along the origin of the great vessels including brachiocephalic, bilateral subclavian arteries. Please correlate with any particular symptoms. Mediastinum/Nodes: Mildly patulous thoracic esophagus with a small hiatal hernia. No specific abnormal lymph node enlargement identified in the axillary region. There are some small bilateral hilar nodes. Example on the right measures 16 by 12 mm and left 15 x 13 mm. Lungs/Pleura: Centrilobular emphysematous lung changes are identified particularly along the upper lung zones. Bilateral upper lobe areas of scarring and pleural thickening with calcification. Scattered areas of interstitial septal thickening. Additional areas of pleural thickening along the anterior left hemithorax. Trace bilateral pleural fluid with some adjacent presumed dependent atelectasis. Upper Abdomen: The adrenal glands are preserved in the upper abdomen. Please correlate with the  recent CT scan of the abdomen and pelvis from earlier on 12/17/2022. Musculoskeletal: Scattered degenerative changes along the spine. Review of the MIP images confirms the above findings. IMPRESSION: No pulmonary embolism identified. There is some enlargement of the pulmonary arteries. Please correlate for evidence of pulmonary artery hypertension. Chronic lung changes with centrilobular emphysematous change. There is significant apical pleural thickening with calcification. Associated scarring and fibrotic changes as well. These changes are progressive from the study of 2015. Tiny pleural effusions. Few enlarged bilateral hilar lymph nodes. This is of uncertain etiology. Short follow-up or additional workup when clinically appropriate. Small hiatal hernia. Significant calcified plaque along the aorta and branch vessels including areas of stenosis  suggested along the brachiocephalic artery and bilateral subclavian arteries. Please correlate with any particular symptoms and if needed additional CT angiogram as clinically appropriate Aortic Atherosclerosis (ICD10-I70.0) and Emphysema (ICD10-J43.9). Electronically Signed   By: Karen Kays M.D.   On: 12/18/2022 10:22   CT Head Wo Contrast  Result Date: 12/18/2022 CLINICAL DATA:  Altered mental status, weakness, dizziness EXAM: CT HEAD WITHOUT CONTRAST TECHNIQUE: Contiguous axial images were obtained from the base of the skull through the vertex without intravenous contrast. RADIATION DOSE REDUCTION: This exam was performed according to the departmental dose-optimization program which includes automated exposure control, adjustment of the mA and/or kV according to patient size and/or use of iterative reconstruction technique. COMPARISON:  09/16/2022 FINDINGS: Brain: No evidence of acute infarction, hemorrhage, hydrocephalus, extra-axial collection or mass lesion/mass effect. Mild global cortical atrophy. Subcortical white matter and periventricular small vessel  ischemic changes. Vascular: Intracranial atherosclerosis. Skull: Normal. Negative for fracture or focal lesion. Sinuses/Orbits: The visualized paranasal sinuses are essentially clear. The mastoid air cells are unopacified. Other: None. IMPRESSION: No acute intracranial abnormality. Mild atrophy with small vessel ischemic changes. Electronically Signed   By: Charline Bills M.D.   On: 12/18/2022 00:11   CT ABDOMEN PELVIS WO CONTRAST  Result Date: 12/18/2022 CLINICAL DATA:  Diarrhea, weakness EXAM: CT ABDOMEN AND PELVIS WITHOUT CONTRAST TECHNIQUE: Multidetector CT imaging of the abdomen and pelvis was performed following the standard protocol without IV contrast. RADIATION DOSE REDUCTION: This exam was performed according to the departmental dose-optimization program which includes automated exposure control, adjustment of the mA and/or kV according to patient size and/or use of iterative reconstruction technique. COMPARISON:  CTA abdomen/pelvis dated 06/23/2008 FINDINGS: Lower chest: Lung bases are clear. Hepatobiliary: Unenhanced liver is unremarkable. Mildly distended gallbladder. No intrahepatic or extrahepatic duct dilatation. Pancreas: Within normal limits. Spleen: Within normal limits. Adrenals/Urinary Tract: Adrenal glands are within normal limits. Right renal cortical atrophy. 2.0 cm left upper pole renal cyst (series 2/image 24), measuring simple fluid density, benign (Bosniak I). No follow-up is recommended. Renal vascular calcifications. No hydronephrosis. Bladder is within normal limits. Stomach/Bowel: Stomach is notable for a tiny hiatal hernia. No evidence of bowel obstruction. Appendix is not discretely visualized. Segmental wall thickening involving the transverse colon (series 2/image 44), suggesting infectious/inflammatory colitis. No pneumatosis or free air. 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 Atherosclerotic calcifications of the  abdominal aorta and branch vessels. Left renal stent. No suspicious abdominopelvic lymphadenopathy. Reproductive: Uterus is grossly unremarkable. Bilateral ovaries are within normal limits. Other: Suspected trace pelvic ascites (series 2/image 67). Musculoskeletal: Degenerative changes of the visualized thoracolumbar spine. IMPRESSION: Segmental wall thickening involving the transverse colon, suggesting infectious/inflammatory colitis. Additional ancillary findings as above. Electronically Signed   By: Charline Bills M.D.   On: 12/18/2022 00:08   DG Chest 1 View  Result Date: 12/17/2022 CLINICAL DATA:  Weakness. EXAM: CHEST  1 VIEW COMPARISON:  10/17/2015 FINDINGS: Lung volumes are low. Bronchovascular crowding versus vascular congestion. The heart is normal in size. Aortic atherosclerosis. No pneumothorax or pleural effusion. No acute osseous findings. IMPRESSION: Low lung volumes with bronchovascular crowding versus vascular congestion. Electronically Signed   By: Narda Rutherford M.D.   On: 12/17/2022 22:50    Microbiology: Results for orders placed or performed during the hospital encounter of 12/17/22  Resp panel by RT-PCR (RSV, Flu A&B, Covid) Anterior Nasal Swab     Status: None   Collection Time: 12/17/22 10:13 PM   Specimen: Anterior Nasal Swab  Result Value Ref Range Status   SARS Coronavirus 2 by RT PCR NEGATIVE NEGATIVE Final    Comment: (NOTE) SARS-CoV-2 target nucleic acids are NOT DETECTED.  The SARS-CoV-2 RNA is generally detectable in upper respiratory specimens during the acute phase of infection. The lowest concentration of SARS-CoV-2 viral copies this assay can detect is 138 copies/mL. A negative result does not preclude SARS-Cov-2 infection and should not be used as the sole basis for treatment or other patient management decisions. A negative result may occur with  improper specimen collection/handling, submission of specimen other than nasopharyngeal swab, presence of  viral mutation(s) within the areas targeted by this assay, and inadequate number of viral copies(<138 copies/mL). A negative result must be combined with clinical observations, patient history, and epidemiological information. The expected result is Negative.  Fact Sheet for Patients:  BloggerCourse.com  Fact Sheet for Healthcare Providers:  SeriousBroker.it  This test is no t yet approved or cleared by the [place] FDA and  has been authorized for detection and/or diagnosis of SARS-CoV-2 by FDA under an Emergency Use Authorization (EUA). This EUA will remain  in effect (meaning this test can be used) for the duration of the COVID-19 declaration under Section 564(b)(1) of the Act, 21 U.S.C.section 360bbb-3(b)(1), unless the authorization is terminated  or revoked sooner.       Influenza A by PCR NEGATIVE NEGATIVE Final   Influenza B by PCR NEGATIVE NEGATIVE Final    Comment: (NOTE) The Xpert Xpress SARS-CoV-2/FLU/RSV plus assay is intended as an aid in the diagnosis of influenza from Nasopharyngeal swab specimens and should not be used as a sole basis for treatment. Nasal washings and aspirates are unacceptable for Xpert Xpress SARS-CoV-2/FLU/RSV testing.  Fact Sheet for Patients: BloggerCourse.com  Fact Sheet for Healthcare Providers: SeriousBroker.it  This test is not yet approved or cleared by the [place] FDA and has been authorized for detection and/or diagnosis of SARS-CoV-2 by FDA under an Emergency Use Authorization (EUA). This EUA will remain in effect (meaning this test can be used) for the duration of the COVID-19 declaration under Section 564(b)(1) of the Act, 21 U.S.C. section 360bbb-3(b)(1), unless the authorization is terminated or revoked.     Resp Syncytial Virus by PCR NEGATIVE NEGATIVE Final    Comment: (NOTE) Fact Sheet for  Patients: BloggerCourse.com  Fact Sheet for Healthcare Providers: SeriousBroker.it  This test is not yet approved or cleared by the [place] FDA and has been authorized for detection and/or diagnosis of SARS-CoV-2 by FDA under an Emergency Use Authorization (EUA). This EUA will remain in effect (meaning this test can be used) for the duration of the COVID-19 declaration under Section 564(b)(1) of the Act, 21 U.S.C. section 360bbb-3(b)(1), unless the authorization is terminated or revoked.  Performed at [facility], [address], [place], [place] [address]   Urine Culture     Status: Abnormal   Collection Time: 12/17/22 11:26 PM   Specimen: Urine, Random  Result Value Ref Range Status   Specimen Description   Final    URINE, RANDOM Performed at [facility], [address], [place], [place] [address]    Special Requests   Final    NONE Performed at [facility], [address] [place], [place] [address]    Culture >=100,000 COLONIES/mL ESCHERICHIA COLI (A)  Final   Report Status 12/21/2022 FINAL  Final   Organism ID, Bacteria ESCHERICHIA COLI (A)  Final      Susceptibility   Escherichia  coli - MIC*    AMPICILLIN <=2 SENSITIVE Sensitive     CEFAZOLIN <=4 SENSITIVE Sensitive     CEFEPIME <=0.12 SENSITIVE Sensitive     CEFTRIAXONE <=0.25 SENSITIVE Sensitive     CIPROFLOXACIN <=0.25 SENSITIVE Sensitive     GENTAMICIN <=1 SENSITIVE Sensitive     IMIPENEM <=0.25 SENSITIVE Sensitive     NITROFURANTOIN <=16 SENSITIVE Sensitive     TRIMETH/SULFA <=20 SENSITIVE Sensitive     AMPICILLIN/SULBACTAM <=2 SENSITIVE Sensitive     PIP/TAZO <=4 SENSITIVE Sensitive     * >=100,000 COLONIES/mL ESCHERICHIA COLI  Culture, blood (routine x 2)     Status: None (Preliminary result)   Collection Time: 12/17/22 11:44 PM   Specimen: BLOOD  Result Value Ref Range Status   Specimen Description BLOOD RIGHT ASSIST  CONTROL  Final   Special Requests   Final    BOTTLES DRAWN AEROBIC AND ANAEROBIC Blood Culture results may not be optimal due to an excessive volume of blood received in culture bottles   Culture   Final    NO GROWTH 3 DAYS Performed at [facility], [address] [place], [place] [address]    Report Status PENDING  Incomplete  Culture, blood (routine x 2)     Status: None (Preliminary result)   Collection Time: 12/17/22 11:46 PM   Specimen: BLOOD  Result Value Ref Range Status   Specimen Description BLOOD LEFT ASSIST CONTROL  Final   Special Requests   Final    BOTTLES DRAWN AEROBIC AND ANAEROBIC Blood Culture adequate volume   Culture   Final    NO GROWTH 3 DAYS Performed at [facility], [address], [place], [place] [address]    Report Status PENDING  Incomplete  C Difficile Quick Screen w PCR reflex     Status: None   Collection Time: 12/18/22  4:49 AM   Specimen: STOOL  Result Value Ref Range Status   C Diff antigen NEGATIVE NEGATIVE Final   C Diff toxin NEGATIVE NEGATIVE Final   C Diff interpretation No C. difficile detected.  Final    Comment: Performed at [facility], [address] [place], [place] [address]  Gastrointestinal Panel by PCR , Stool     Status: None   Collection Time: 12/18/22 11:00 AM   Specimen: Stool  Result Value Ref Range Status   Campylobacter species NOT DETECTED NOT DETECTED Final   Plesimonas shigelloides NOT DETECTED NOT DETECTED Final   Salmonella species NOT DETECTED NOT DETECTED Final   Yersinia enterocolitica NOT DETECTED NOT DETECTED Final   Vibrio species NOT DETECTED NOT DETECTED Final   Vibrio cholerae NOT DETECTED NOT DETECTED Final   Enteroaggregative E coli (EAEC) NOT DETECTED NOT DETECTED Final   Enteropathogenic E coli (EPEC) NOT DETECTED NOT DETECTED Final   Enterotoxigenic E coli (ETEC) NOT DETECTED NOT DETECTED Final   Shiga like toxin producing E coli (STEC) NOT DETECTED NOT DETECTED Final    Shigella/Enteroinvasive E coli (EIEC) NOT DETECTED NOT DETECTED Final   Cryptosporidium NOT DETECTED NOT DETECTED Final   Cyclospora cayetanensis NOT DETECTED NOT DETECTED Final   Entamoeba histolytica NOT DETECTED NOT DETECTED Final   Giardia lamblia NOT DETECTED NOT DETECTED Final   Adenovirus F40/41 NOT DETECTED NOT DETECTED Final   Astrovirus NOT DETECTED NOT DETECTED Final   Norovirus GI/GII NOT DETECTED NOT DETECTED Final   Rotavirus A NOT DETECTED NOT DETECTED Final   Sapovirus (I, II, IV, and V) NOT DETECTED NOT DETECTED  Final    Comment: Performed at [facility], [address] [place], [place] [address]    Labs: CBC: Recent Labs  Lab 12/17/22 2211 12/18/22 0449  WBC 11.3* 14.9*  NEUTROABS 9.8*  --   HGB 11.6* 10.0*  HCT 35.7* 30.6*  MCV 94.7 95.9  PLT 142* 128*   Basic Metabolic Panel: Recent Labs  Lab 12/17/22 2211 12/18/22 0449  NA 134* 137  K 4.2 4.4  CL 102 107  CO2 24 23  GLUCOSE 98 95  BUN 21 17  CREATININE 1.29* 1.13*  CALCIUM 8.2* 7.4*   Liver Function Tests: Recent Labs  Lab 12/17/22 2211  AST 31  ALT 14  ALKPHOS 48  BILITOT 0.9  PROT 6.3*  ALBUMIN 3.4*    Discharge time spent: greater than 30 minutes.  Signed: Enedina Finner, MD [facility] 12/21/2022

## 2022-12-21 NOTE — TOC Progression Note (Signed)
Transition of Care [facility]) - Progression Note    Patient Details  Name: Tamara Finley MRN: [id number] Date of Birth: 07/13/1941  Transition of Care [facility]) CM/SW Contact  Ashley Royalty Lutricia Feil, RN Phone Number: [phone] 12/21/2022, 1:23 PM  Clinical Narrative:    Received the request pt will need home O2 and aide services added to [facility] with [facility].  Spoke with Cristie Hem [facility]) and added aide services Spoke with Boyd Kerbs (daughter) on choice and referred Home O2 and nebulizer device to [employer]. Will be delivered to bedside.  TOC remains available for any additional needs.   Expected Discharge Plan: Home w Home Health Services Barriers to Discharge: No Barriers Identified  Expected Discharge Plan and Services   Discharge Planning Services: CM Consult   Living arrangements for the past 2 months: Single Family Home Expected Discharge Date: 12/21/22               DME Arranged: Nebulizer machine         HH Arranged: PT HH Agency: [facility] Date HH Agency Contacted: 12/20/22 Time HH Agency Contacted: 1532 Representative spoke with at [facility] Agency: i Cristie Hem and [facility] geologist   Social Determinants of Health (SDOH) Interventions SDOH Screenings   Food Insecurity: No Food Insecurity (12/18/2022)  Housing: Low Risk  (12/18/2022)  Transportation Needs: No Transportation Needs (12/18/2022)  Utilities: Not At Risk (12/18/2022)  Depression (PHQ2-9): Low Risk  (10/29/2021)  Tobacco Use: Medium Risk (12/18/2022)    Readmission Risk Interventions     No data to display

## 2022-12-22 LAB — CULTURE, BLOOD (ROUTINE X 2)

## 2022-12-23 DIAGNOSIS — K529 Noninfective gastroenteritis and colitis, unspecified: Secondary | ICD-10-CM | POA: Diagnosis not present

## 2022-12-23 DIAGNOSIS — E1151 Type 2 diabetes mellitus with diabetic peripheral angiopathy without gangrene: Secondary | ICD-10-CM | POA: Diagnosis not present

## 2022-12-23 DIAGNOSIS — I129 Hypertensive chronic kidney disease with stage 1 through stage 4 chronic kidney disease, or unspecified chronic kidney disease: Secondary | ICD-10-CM | POA: Diagnosis not present

## 2022-12-23 DIAGNOSIS — E1122 Type 2 diabetes mellitus with diabetic chronic kidney disease: Secondary | ICD-10-CM | POA: Diagnosis not present

## 2022-12-23 DIAGNOSIS — N390 Urinary tract infection, site not specified: Secondary | ICD-10-CM | POA: Diagnosis not present

## 2022-12-23 DIAGNOSIS — B9620 Unspecified Escherichia coli [E. coli] as the cause of diseases classified elsewhere: Secondary | ICD-10-CM | POA: Diagnosis not present

## 2022-12-23 DIAGNOSIS — I701 Atherosclerosis of renal artery: Secondary | ICD-10-CM | POA: Diagnosis not present

## 2022-12-23 DIAGNOSIS — I2510 Atherosclerotic heart disease of native coronary artery without angina pectoris: Secondary | ICD-10-CM | POA: Diagnosis not present

## 2022-12-23 DIAGNOSIS — N1832 Chronic kidney disease, stage 3b: Secondary | ICD-10-CM | POA: Diagnosis not present

## 2022-12-23 LAB — CULTURE, BLOOD (ROUTINE X 2): Culture: NO GROWTH

## 2022-12-24 DIAGNOSIS — I739 Peripheral vascular disease, unspecified: Secondary | ICD-10-CM | POA: Diagnosis not present

## 2022-12-24 DIAGNOSIS — K529 Noninfective gastroenteritis and colitis, unspecified: Secondary | ICD-10-CM | POA: Diagnosis not present

## 2022-12-24 DIAGNOSIS — J449 Chronic obstructive pulmonary disease, unspecified: Secondary | ICD-10-CM | POA: Diagnosis not present

## 2022-12-24 DIAGNOSIS — N2581 Secondary hyperparathyroidism of renal origin: Secondary | ICD-10-CM | POA: Diagnosis not present

## 2022-12-24 DIAGNOSIS — G319 Degenerative disease of nervous system, unspecified: Secondary | ICD-10-CM | POA: Diagnosis not present

## 2022-12-24 DIAGNOSIS — I129 Hypertensive chronic kidney disease with stage 1 through stage 4 chronic kidney disease, or unspecified chronic kidney disease: Secondary | ICD-10-CM | POA: Diagnosis not present

## 2022-12-24 DIAGNOSIS — I771 Stricture of artery: Secondary | ICD-10-CM | POA: Diagnosis not present

## 2022-12-24 DIAGNOSIS — N1831 Chronic kidney disease, stage 3a: Secondary | ICD-10-CM | POA: Diagnosis not present

## 2022-12-24 DIAGNOSIS — Z87891 Personal history of nicotine dependence: Secondary | ICD-10-CM | POA: Diagnosis not present

## 2022-12-30 DIAGNOSIS — E1151 Type 2 diabetes mellitus with diabetic peripheral angiopathy without gangrene: Secondary | ICD-10-CM | POA: Diagnosis not present

## 2022-12-30 DIAGNOSIS — N390 Urinary tract infection, site not specified: Secondary | ICD-10-CM | POA: Diagnosis not present

## 2022-12-30 DIAGNOSIS — E213 Hyperparathyroidism, unspecified: Secondary | ICD-10-CM | POA: Diagnosis not present

## 2022-12-30 DIAGNOSIS — J4489 Other specified chronic obstructive pulmonary disease: Secondary | ICD-10-CM | POA: Diagnosis not present

## 2022-12-30 DIAGNOSIS — I771 Stricture of artery: Secondary | ICD-10-CM | POA: Diagnosis not present

## 2022-12-30 DIAGNOSIS — Z9889 Other specified postprocedural states: Secondary | ICD-10-CM | POA: Diagnosis not present

## 2022-12-30 DIAGNOSIS — I701 Atherosclerosis of renal artery: Secondary | ICD-10-CM | POA: Diagnosis not present

## 2022-12-30 DIAGNOSIS — E1122 Type 2 diabetes mellitus with diabetic chronic kidney disease: Secondary | ICD-10-CM | POA: Diagnosis not present

## 2022-12-30 DIAGNOSIS — K529 Noninfective gastroenteritis and colitis, unspecified: Secondary | ICD-10-CM | POA: Diagnosis not present

## 2022-12-30 DIAGNOSIS — C50919 Malignant neoplasm of unspecified site of unspecified female breast: Secondary | ICD-10-CM | POA: Diagnosis not present

## 2022-12-30 DIAGNOSIS — G319 Degenerative disease of nervous system, unspecified: Secondary | ICD-10-CM | POA: Diagnosis not present

## 2022-12-30 DIAGNOSIS — N1832 Chronic kidney disease, stage 3b: Secondary | ICD-10-CM | POA: Diagnosis not present

## 2022-12-30 DIAGNOSIS — I739 Peripheral vascular disease, unspecified: Secondary | ICD-10-CM | POA: Diagnosis not present

## 2022-12-30 DIAGNOSIS — B9620 Unspecified Escherichia coli [E. coli] as the cause of diseases classified elsewhere: Secondary | ICD-10-CM | POA: Diagnosis not present

## 2022-12-30 DIAGNOSIS — I2510 Atherosclerotic heart disease of native coronary artery without angina pectoris: Secondary | ICD-10-CM | POA: Diagnosis not present

## 2022-12-30 DIAGNOSIS — I129 Hypertensive chronic kidney disease with stage 1 through stage 4 chronic kidney disease, or unspecified chronic kidney disease: Secondary | ICD-10-CM | POA: Diagnosis not present

## 2022-12-31 DIAGNOSIS — E1122 Type 2 diabetes mellitus with diabetic chronic kidney disease: Secondary | ICD-10-CM | POA: Diagnosis not present

## 2022-12-31 DIAGNOSIS — I701 Atherosclerosis of renal artery: Secondary | ICD-10-CM | POA: Diagnosis not present

## 2022-12-31 DIAGNOSIS — N1832 Chronic kidney disease, stage 3b: Secondary | ICD-10-CM | POA: Diagnosis not present

## 2022-12-31 DIAGNOSIS — I2510 Atherosclerotic heart disease of native coronary artery without angina pectoris: Secondary | ICD-10-CM | POA: Diagnosis not present

## 2022-12-31 DIAGNOSIS — B9620 Unspecified Escherichia coli [E. coli] as the cause of diseases classified elsewhere: Secondary | ICD-10-CM | POA: Diagnosis not present

## 2022-12-31 DIAGNOSIS — N390 Urinary tract infection, site not specified: Secondary | ICD-10-CM | POA: Diagnosis not present

## 2022-12-31 DIAGNOSIS — I129 Hypertensive chronic kidney disease with stage 1 through stage 4 chronic kidney disease, or unspecified chronic kidney disease: Secondary | ICD-10-CM | POA: Diagnosis not present

## 2022-12-31 DIAGNOSIS — E1151 Type 2 diabetes mellitus with diabetic peripheral angiopathy without gangrene: Secondary | ICD-10-CM | POA: Diagnosis not present

## 2022-12-31 DIAGNOSIS — K529 Noninfective gastroenteritis and colitis, unspecified: Secondary | ICD-10-CM | POA: Diagnosis not present

## 2023-01-01 DIAGNOSIS — I129 Hypertensive chronic kidney disease with stage 1 through stage 4 chronic kidney disease, or unspecified chronic kidney disease: Secondary | ICD-10-CM | POA: Diagnosis not present

## 2023-01-01 DIAGNOSIS — E1122 Type 2 diabetes mellitus with diabetic chronic kidney disease: Secondary | ICD-10-CM | POA: Diagnosis not present

## 2023-01-01 DIAGNOSIS — B9620 Unspecified Escherichia coli [E. coli] as the cause of diseases classified elsewhere: Secondary | ICD-10-CM | POA: Diagnosis not present

## 2023-01-01 DIAGNOSIS — N1832 Chronic kidney disease, stage 3b: Secondary | ICD-10-CM | POA: Diagnosis not present

## 2023-01-01 DIAGNOSIS — I701 Atherosclerosis of renal artery: Secondary | ICD-10-CM | POA: Diagnosis not present

## 2023-01-01 DIAGNOSIS — K529 Noninfective gastroenteritis and colitis, unspecified: Secondary | ICD-10-CM | POA: Diagnosis not present

## 2023-01-01 DIAGNOSIS — E1151 Type 2 diabetes mellitus with diabetic peripheral angiopathy without gangrene: Secondary | ICD-10-CM | POA: Diagnosis not present

## 2023-01-01 DIAGNOSIS — I2510 Atherosclerotic heart disease of native coronary artery without angina pectoris: Secondary | ICD-10-CM | POA: Diagnosis not present

## 2023-01-01 DIAGNOSIS — N390 Urinary tract infection, site not specified: Secondary | ICD-10-CM | POA: Diagnosis not present

## 2023-01-02 DIAGNOSIS — I129 Hypertensive chronic kidney disease with stage 1 through stage 4 chronic kidney disease, or unspecified chronic kidney disease: Secondary | ICD-10-CM | POA: Diagnosis not present

## 2023-01-02 DIAGNOSIS — I2510 Atherosclerotic heart disease of native coronary artery without angina pectoris: Secondary | ICD-10-CM | POA: Diagnosis not present

## 2023-01-02 DIAGNOSIS — N1832 Chronic kidney disease, stage 3b: Secondary | ICD-10-CM | POA: Diagnosis not present

## 2023-01-02 DIAGNOSIS — E1151 Type 2 diabetes mellitus with diabetic peripheral angiopathy without gangrene: Secondary | ICD-10-CM | POA: Diagnosis not present

## 2023-01-02 DIAGNOSIS — K529 Noninfective gastroenteritis and colitis, unspecified: Secondary | ICD-10-CM | POA: Diagnosis not present

## 2023-01-02 DIAGNOSIS — B9620 Unspecified Escherichia coli [E. coli] as the cause of diseases classified elsewhere: Secondary | ICD-10-CM | POA: Diagnosis not present

## 2023-01-02 DIAGNOSIS — I701 Atherosclerosis of renal artery: Secondary | ICD-10-CM | POA: Diagnosis not present

## 2023-01-02 DIAGNOSIS — N390 Urinary tract infection, site not specified: Secondary | ICD-10-CM | POA: Diagnosis not present

## 2023-01-02 DIAGNOSIS — E1122 Type 2 diabetes mellitus with diabetic chronic kidney disease: Secondary | ICD-10-CM | POA: Diagnosis not present

## 2023-01-05 DIAGNOSIS — E1122 Type 2 diabetes mellitus with diabetic chronic kidney disease: Secondary | ICD-10-CM | POA: Diagnosis not present

## 2023-01-05 DIAGNOSIS — E1151 Type 2 diabetes mellitus with diabetic peripheral angiopathy without gangrene: Secondary | ICD-10-CM | POA: Diagnosis not present

## 2023-01-05 DIAGNOSIS — N1832 Chronic kidney disease, stage 3b: Secondary | ICD-10-CM | POA: Diagnosis not present

## 2023-01-05 DIAGNOSIS — I2510 Atherosclerotic heart disease of native coronary artery without angina pectoris: Secondary | ICD-10-CM | POA: Diagnosis not present

## 2023-01-05 DIAGNOSIS — I701 Atherosclerosis of renal artery: Secondary | ICD-10-CM | POA: Diagnosis not present

## 2023-01-05 DIAGNOSIS — B9620 Unspecified Escherichia coli [E. coli] as the cause of diseases classified elsewhere: Secondary | ICD-10-CM | POA: Diagnosis not present

## 2023-01-05 DIAGNOSIS — K529 Noninfective gastroenteritis and colitis, unspecified: Secondary | ICD-10-CM | POA: Diagnosis not present

## 2023-01-05 DIAGNOSIS — I129 Hypertensive chronic kidney disease with stage 1 through stage 4 chronic kidney disease, or unspecified chronic kidney disease: Secondary | ICD-10-CM | POA: Diagnosis not present

## 2023-01-05 DIAGNOSIS — N390 Urinary tract infection, site not specified: Secondary | ICD-10-CM | POA: Diagnosis not present

## 2023-01-06 DIAGNOSIS — I701 Atherosclerosis of renal artery: Secondary | ICD-10-CM | POA: Diagnosis not present

## 2023-01-06 DIAGNOSIS — B9620 Unspecified Escherichia coli [E. coli] as the cause of diseases classified elsewhere: Secondary | ICD-10-CM | POA: Diagnosis not present

## 2023-01-06 DIAGNOSIS — I129 Hypertensive chronic kidney disease with stage 1 through stage 4 chronic kidney disease, or unspecified chronic kidney disease: Secondary | ICD-10-CM | POA: Diagnosis not present

## 2023-01-06 DIAGNOSIS — E1151 Type 2 diabetes mellitus with diabetic peripheral angiopathy without gangrene: Secondary | ICD-10-CM | POA: Diagnosis not present

## 2023-01-06 DIAGNOSIS — I2510 Atherosclerotic heart disease of native coronary artery without angina pectoris: Secondary | ICD-10-CM | POA: Diagnosis not present

## 2023-01-06 DIAGNOSIS — K529 Noninfective gastroenteritis and colitis, unspecified: Secondary | ICD-10-CM | POA: Diagnosis not present

## 2023-01-06 DIAGNOSIS — E1122 Type 2 diabetes mellitus with diabetic chronic kidney disease: Secondary | ICD-10-CM | POA: Diagnosis not present

## 2023-01-06 DIAGNOSIS — N390 Urinary tract infection, site not specified: Secondary | ICD-10-CM | POA: Diagnosis not present

## 2023-01-06 DIAGNOSIS — N1832 Chronic kidney disease, stage 3b: Secondary | ICD-10-CM | POA: Diagnosis not present

## 2023-01-08 DIAGNOSIS — R197 Diarrhea, unspecified: Secondary | ICD-10-CM | POA: Diagnosis not present

## 2023-01-08 DIAGNOSIS — R531 Weakness: Secondary | ICD-10-CM | POA: Diagnosis not present

## 2023-01-08 DIAGNOSIS — I150 Renovascular hypertension: Secondary | ICD-10-CM | POA: Diagnosis not present

## 2023-01-09 DIAGNOSIS — B9620 Unspecified Escherichia coli [E. coli] as the cause of diseases classified elsewhere: Secondary | ICD-10-CM | POA: Diagnosis not present

## 2023-01-09 DIAGNOSIS — N1832 Chronic kidney disease, stage 3b: Secondary | ICD-10-CM | POA: Diagnosis not present

## 2023-01-09 DIAGNOSIS — I701 Atherosclerosis of renal artery: Secondary | ICD-10-CM | POA: Diagnosis not present

## 2023-01-09 DIAGNOSIS — I129 Hypertensive chronic kidney disease with stage 1 through stage 4 chronic kidney disease, or unspecified chronic kidney disease: Secondary | ICD-10-CM | POA: Diagnosis not present

## 2023-01-09 DIAGNOSIS — K529 Noninfective gastroenteritis and colitis, unspecified: Secondary | ICD-10-CM | POA: Diagnosis not present

## 2023-01-09 DIAGNOSIS — N390 Urinary tract infection, site not specified: Secondary | ICD-10-CM | POA: Diagnosis not present

## 2023-01-09 DIAGNOSIS — I2510 Atherosclerotic heart disease of native coronary artery without angina pectoris: Secondary | ICD-10-CM | POA: Diagnosis not present

## 2023-01-09 DIAGNOSIS — E1122 Type 2 diabetes mellitus with diabetic chronic kidney disease: Secondary | ICD-10-CM | POA: Diagnosis not present

## 2023-01-09 DIAGNOSIS — E1151 Type 2 diabetes mellitus with diabetic peripheral angiopathy without gangrene: Secondary | ICD-10-CM | POA: Diagnosis not present

## 2023-01-13 DIAGNOSIS — N390 Urinary tract infection, site not specified: Secondary | ICD-10-CM | POA: Diagnosis not present

## 2023-01-13 DIAGNOSIS — E1151 Type 2 diabetes mellitus with diabetic peripheral angiopathy without gangrene: Secondary | ICD-10-CM | POA: Diagnosis not present

## 2023-01-13 DIAGNOSIS — I701 Atherosclerosis of renal artery: Secondary | ICD-10-CM | POA: Diagnosis not present

## 2023-01-13 DIAGNOSIS — I129 Hypertensive chronic kidney disease with stage 1 through stage 4 chronic kidney disease, or unspecified chronic kidney disease: Secondary | ICD-10-CM | POA: Diagnosis not present

## 2023-01-13 DIAGNOSIS — B9620 Unspecified Escherichia coli [E. coli] as the cause of diseases classified elsewhere: Secondary | ICD-10-CM | POA: Diagnosis not present

## 2023-01-13 DIAGNOSIS — I2510 Atherosclerotic heart disease of native coronary artery without angina pectoris: Secondary | ICD-10-CM | POA: Diagnosis not present

## 2023-01-13 DIAGNOSIS — N1832 Chronic kidney disease, stage 3b: Secondary | ICD-10-CM | POA: Diagnosis not present

## 2023-01-13 DIAGNOSIS — E1122 Type 2 diabetes mellitus with diabetic chronic kidney disease: Secondary | ICD-10-CM | POA: Diagnosis not present

## 2023-01-13 DIAGNOSIS — K529 Noninfective gastroenteritis and colitis, unspecified: Secondary | ICD-10-CM | POA: Diagnosis not present

## 2023-01-14 DIAGNOSIS — R600 Localized edema: Secondary | ICD-10-CM | POA: Diagnosis not present

## 2023-01-14 DIAGNOSIS — N1831 Chronic kidney disease, stage 3a: Secondary | ICD-10-CM | POA: Diagnosis not present

## 2023-01-14 DIAGNOSIS — I701 Atherosclerosis of renal artery: Secondary | ICD-10-CM | POA: Diagnosis not present

## 2023-01-14 DIAGNOSIS — D631 Anemia in chronic kidney disease: Secondary | ICD-10-CM | POA: Diagnosis not present

## 2023-01-15 DIAGNOSIS — K529 Noninfective gastroenteritis and colitis, unspecified: Secondary | ICD-10-CM | POA: Diagnosis not present

## 2023-01-15 DIAGNOSIS — B9620 Unspecified Escherichia coli [E. coli] as the cause of diseases classified elsewhere: Secondary | ICD-10-CM | POA: Diagnosis not present

## 2023-01-15 DIAGNOSIS — I701 Atherosclerosis of renal artery: Secondary | ICD-10-CM | POA: Diagnosis not present

## 2023-01-15 DIAGNOSIS — N1832 Chronic kidney disease, stage 3b: Secondary | ICD-10-CM | POA: Diagnosis not present

## 2023-01-15 DIAGNOSIS — E1122 Type 2 diabetes mellitus with diabetic chronic kidney disease: Secondary | ICD-10-CM | POA: Diagnosis not present

## 2023-01-15 DIAGNOSIS — E1151 Type 2 diabetes mellitus with diabetic peripheral angiopathy without gangrene: Secondary | ICD-10-CM | POA: Diagnosis not present

## 2023-01-15 DIAGNOSIS — I2510 Atherosclerotic heart disease of native coronary artery without angina pectoris: Secondary | ICD-10-CM | POA: Diagnosis not present

## 2023-01-15 DIAGNOSIS — N390 Urinary tract infection, site not specified: Secondary | ICD-10-CM | POA: Diagnosis not present

## 2023-01-15 DIAGNOSIS — I129 Hypertensive chronic kidney disease with stage 1 through stage 4 chronic kidney disease, or unspecified chronic kidney disease: Secondary | ICD-10-CM | POA: Diagnosis not present

## 2023-01-16 DIAGNOSIS — B9620 Unspecified Escherichia coli [E. coli] as the cause of diseases classified elsewhere: Secondary | ICD-10-CM | POA: Diagnosis not present

## 2023-01-16 DIAGNOSIS — I129 Hypertensive chronic kidney disease with stage 1 through stage 4 chronic kidney disease, or unspecified chronic kidney disease: Secondary | ICD-10-CM | POA: Diagnosis not present

## 2023-01-16 DIAGNOSIS — K529 Noninfective gastroenteritis and colitis, unspecified: Secondary | ICD-10-CM | POA: Diagnosis not present

## 2023-01-16 DIAGNOSIS — E1122 Type 2 diabetes mellitus with diabetic chronic kidney disease: Secondary | ICD-10-CM | POA: Diagnosis not present

## 2023-01-16 DIAGNOSIS — I701 Atherosclerosis of renal artery: Secondary | ICD-10-CM | POA: Diagnosis not present

## 2023-01-16 DIAGNOSIS — N1832 Chronic kidney disease, stage 3b: Secondary | ICD-10-CM | POA: Diagnosis not present

## 2023-01-16 DIAGNOSIS — I2510 Atherosclerotic heart disease of native coronary artery without angina pectoris: Secondary | ICD-10-CM | POA: Diagnosis not present

## 2023-01-16 DIAGNOSIS — N390 Urinary tract infection, site not specified: Secondary | ICD-10-CM | POA: Diagnosis not present

## 2023-01-16 DIAGNOSIS — E1151 Type 2 diabetes mellitus with diabetic peripheral angiopathy without gangrene: Secondary | ICD-10-CM | POA: Diagnosis not present

## 2023-01-19 DIAGNOSIS — I129 Hypertensive chronic kidney disease with stage 1 through stage 4 chronic kidney disease, or unspecified chronic kidney disease: Secondary | ICD-10-CM | POA: Diagnosis not present

## 2023-01-19 DIAGNOSIS — N1832 Chronic kidney disease, stage 3b: Secondary | ICD-10-CM | POA: Diagnosis not present

## 2023-01-19 DIAGNOSIS — I2510 Atherosclerotic heart disease of native coronary artery without angina pectoris: Secondary | ICD-10-CM | POA: Diagnosis not present

## 2023-01-19 DIAGNOSIS — B9620 Unspecified Escherichia coli [E. coli] as the cause of diseases classified elsewhere: Secondary | ICD-10-CM | POA: Diagnosis not present

## 2023-01-19 DIAGNOSIS — N390 Urinary tract infection, site not specified: Secondary | ICD-10-CM | POA: Diagnosis not present

## 2023-01-19 DIAGNOSIS — E1122 Type 2 diabetes mellitus with diabetic chronic kidney disease: Secondary | ICD-10-CM | POA: Diagnosis not present

## 2023-01-19 DIAGNOSIS — I701 Atherosclerosis of renal artery: Secondary | ICD-10-CM | POA: Diagnosis not present

## 2023-01-19 DIAGNOSIS — K529 Noninfective gastroenteritis and colitis, unspecified: Secondary | ICD-10-CM | POA: Diagnosis not present

## 2023-01-19 DIAGNOSIS — E1151 Type 2 diabetes mellitus with diabetic peripheral angiopathy without gangrene: Secondary | ICD-10-CM | POA: Diagnosis not present

## 2023-01-20 ENCOUNTER — Ambulatory Visit
Payer: Medicare HMO | Attending: Student in an Organized Health Care Education/Training Program | Admitting: Student in an Organized Health Care Education/Training Program

## 2023-01-20 ENCOUNTER — Encounter: Payer: Self-pay | Admitting: Student in an Organized Health Care Education/Training Program

## 2023-01-20 VITALS — BP 102/60 | HR 60 | Temp 97.2°F | Resp 18 | Ht 63.0 in | Wt 190.0 lb

## 2023-01-20 DIAGNOSIS — M533 Sacrococcygeal disorders, not elsewhere classified: Secondary | ICD-10-CM | POA: Diagnosis not present

## 2023-01-20 DIAGNOSIS — M47816 Spondylosis without myelopathy or radiculopathy, lumbar region: Secondary | ICD-10-CM | POA: Diagnosis not present

## 2023-01-20 DIAGNOSIS — G894 Chronic pain syndrome: Secondary | ICD-10-CM | POA: Diagnosis not present

## 2023-01-20 DIAGNOSIS — M5136 Other intervertebral disc degeneration, lumbar region: Secondary | ICD-10-CM

## 2023-01-20 DIAGNOSIS — M4726 Other spondylosis with radiculopathy, lumbar region: Secondary | ICD-10-CM | POA: Diagnosis not present

## 2023-01-20 DIAGNOSIS — M5416 Radiculopathy, lumbar region: Secondary | ICD-10-CM | POA: Insufficient documentation

## 2023-01-20 DIAGNOSIS — G8929 Other chronic pain: Secondary | ICD-10-CM | POA: Diagnosis not present

## 2023-01-20 MED ORDER — HYDROCODONE-ACETAMINOPHEN 5-325 MG PO TABS: 1.0000 | ORAL_TABLET | Freq: Two times a day (BID) | ORAL | 0 refills | Status: DC | PRN

## 2023-01-20 MED ORDER — HYDROCODONE-ACETAMINOPHEN 5-325 MG PO TABS: 1.0000 | ORAL_TABLET | Freq: Two times a day (BID) | ORAL | 0 refills | Status: AC | PRN

## 2023-01-20 NOTE — Progress Notes (Signed)
Safety precautions to be maintained throughout the outpatient stay will include: orient to surroundings, keep bed in low position, maintain call bell within reach at all times, provide assistance with transfer out of bed and ambulation.   Nursing Pain Medication Assessment:  Safety precautions to be maintained throughout the outpatient stay will include: orient to surroundings, keep bed in low position, maintain call bell within reach at all times, provide assistance with transfer out of bed and ambulation.  Medication Inspection Compliance: Pill count conducted under aseptic conditions, in front of the patient. Neither the pills nor the bottle was removed from the patient's sight at any time. Once count was completed pills were immediately returned to the patient in their original bottle.  Medication: Hydrocodone/APAP Pill/Patch Count:  0 of 60 pills remain Pill/Patch Appearance: Markings consistent with prescribed medication Bottle Appearance: Standard pharmacy container. Clearly labeled. Filled Date: 04 / 16 / 2024 Last Medication intake:  Yesterday  Call to patient pharmacy to ask if she has any more refills of the Hydrocodone/APAP. Spoke with pharmacist [facility]ig Castilla at [facility] in [place]. Per pharmacist patient has  01/02/23

## 2023-01-20 NOTE — Progress Notes (Signed)
PROVIDER NOTE: Information contained herein reflects review and annotations entered in association with encounter. Interpretation of such information and data should be left to medically-trained personnel. Information provided to patient can be located elsewhere in the medical record under "Patient Instructions". Document created using STT-dictation technology, any transcriptional errors that may result from process are unintentional.    Patient: Tamara Finley  Service Category: E/M  Provider: Edward Jolly, MD  DOB: [birth year]  DOS: 01/20/2023  Specialty: Interventional Pain Management  MRN: [id number]  Setting: Ambulatory outpatient  PCP: Lynnea Ferrier, MD  Type: Established Patient    Referring Provider: Lynnea Ferrier, MD  Location: Office  Delivery: Face-to-face     HPI  Tamara Finley, a 82 y.o. year old female, is here today because of her Lumbar spondylosis [M47.816]. Tamara Finley primary complain today is Back Pain  Last encounter: My last encounter with her was on 10/30/22  Pertinent problems: Tamara Finley has Low back pain radiating to both legs; Peripheral arterial disease (HCC); Right hip pain; Chronic SI joint pain; Lumbar facet arthropathy (L4/L5 and L5/S1); Controlled substance agreement signed; Lumbar spondylosis; and Lumbar radiculopathy on their pertinent problem list. Pain Assessment: Severity of Chronic pain is reported as a 5 /10. Location: Back Lower, Right, Left/Radaites from lower back into legs bilateral down to knees. Onset: More than a month ago. Quality: Constant, Dull, Aching. Timing: Constant. Modifying factor(s): Pain medication and rest. Vitals:  height is 5\' 3"  (1.6 m) and weight is 190 lb (86.2 kg). Her temporal temperature is 97.2 F (36.2 C) (abnormal). Her blood pressure is 102/60 and her pulse is 60. Her respiration is 18 and oxygen saturation is 100%.   Reason for encounter: medication management.   Hospitalized for colitis and UTI a couple of weeks  ago. She has also been experiencing diarrhea and associated fatigue.  She states that she is trying to keep up with water intake. She takes hydrocodone 5 mg once to twice daily as needed.  She takes hydrocodone on a as needed basis and states that it does help her with her low back pain allows her to function and complete her ADLs.  She lives independently. She still has a prescription of hydrocodone at her pharmacy. She continues gabapentin 300 mg nightly   Pharmacotherapy Assessment  Analgesic: Hydrocodone 5 mg two times daily prn, quantity 60/month    Monitoring: [place] PMP: PDMP reviewed during this encounter.       Pharmacotherapy: No side-effects or adverse reactions reported. Compliance: No problems identified. Effectiveness: Clinically acceptable.  Earlyne Iba, RN  01/20/2023  1:20 PM  Sign when Signing Visit Safety precautions to be maintained throughout the outpatient stay will include: orient to surroundings, keep bed in low position, maintain call bell within reach at all times, provide assistance with transfer out of bed and ambulation.   Nursing Pain Medication Assessment:  Safety precautions to be maintained throughout the outpatient stay will include: orient to surroundings, keep bed in low position, maintain call bell within reach at all times, provide assistance with transfer out of bed and ambulation.  Medication Inspection Compliance: Pill count conducted under aseptic conditions, in front of the patient. Neither the pills nor the bottle was removed from the patient's sight at any time. Once count was completed pills were immediately returned to the patient in their original bottle.  Medication: Hydrocodone/APAP Pill/Patch Count:  0 of 60 pills remain Pill/Patch Appearance: Markings consistent with prescribed medication Bottle Appearance: Standard pharmacy  container. Clearly labeled. Filled Date: 04 / 16 / 2024 Last Medication intake:  Yesterday  Call to patient  pharmacy to ask if she has any more refills of the Hydrocodone/APAP. Sp[facility] [place]. Per pharmacist patient has  01/02/23      UDS:  Summary  Date Value Ref Range Status  04/30/2021 Note  Final    Comment:    ==================================================================== ToxASSURE Select 13 (MW) ==================================================================== Test                             Result       Flag       Units  Drug Present and Declared for Prescription Verification   Norhydrocodone                 223          EXPECTED   ng/mg creat    Norhydrocodone is an expected metabolite of hydrocodone.  Drug Absent but Declared for Prescription Verification   Hydrocodone                    Not Detected UNEXPECTED ng/mg creat    Hydrocodone is almost always present in patients taking this drug    consistently. Absence of hydrocodone could be due to lapse of time    since the last dose or unusual pharmacokinetics (rapid metabolism).  ==================================================================== Test                      Result    Flag   Units      Ref Range   Creatinine              26               mg/dL      >=16 ==================================================================== Declared Medications:  The flagging and interpretation on this report are based on the  following declared medications.  Unexpected results may arise from  inaccuracies in the declared medications.   **Note: The testing scope of this panel includes these medications:   Hydrocodone (Norco)   **Note: The testing scope of this panel does not include the  following reported medications:   Acetaminophen (Tylenol)  Acetaminophen (Norco)  Albuterol (Ventolin HFA)  Anastrozole (Arimidex)  Aspirin  Atorvastatin (Lipitor)  Azelastine (Astelin)  Calcium  Carvedilol (Coreg)  Cyanocobalamin  Fluticasone (Flonase)  Furosemide (Lasix)  Gabapentin  (Neurontin)  Hydralazine (Apresoline)  Levothyroxine (Synthroid)  Mirtazapine (Remeron)  Montelukast (Singulair)  Pantoprazole (Protonix)  Potassium (Klor-Con)  Vitamin D ==================================================================== For clinical consultation, please call [phone]. ====================================================================      ROS  Constitutional: Denies any fever or chills Gastrointestinal: No reported hemesis, hematochezia, vomiting, or acute GI distress Musculoskeletal:  low back pain Neurological: No reported episodes of acute onset apraxia, aphasia, dysarthria, agnosia, amnesia, paralysis, loss of coordination, or loss of consciousness  Medication Review  Calcium 600+D Plus Minerals, HYDROcodone-acetaminophen, acetaminophen, albuterol, atorvastatin, azelastine, carvedilol, cyanocobalamin, escitalopram, fluticasone, furosemide, gabapentin, hydrALAZINE, levothyroxine, mirtazapine, pantoprazole, and potassium chloride SA  History Review  Allergy: Tamara Finley is allergic to benadryl [diphenhydramine hcl (sleep)] and alprazolam. Drug: Tamara Finley  reports no history of drug use. Alcohol:  reports no history of alcohol use. Tobacco:  reports that she quit smoking about 9 years ago. Her smoking use included cigarettes. She has a 100.00 pack-year smoking history. She has never used smokeless tobacco. Social: Tamara Finley  reports that she quit smoking about 9 years ago. Her smoking use included cigarettes. She has a 100.00 pack-year smoking history. She has never used smokeless tobacco. She reports that she does not drink alcohol and does not use drugs. Medical:  has a past medical history of Abdominal aortic aneurysm (AAA) (HCC) (2000's), Age related entropion, unspecified laterality, Anxiety, Anxiety and depression, Bilateral renal artery stenosis (HCC), Breast calcification, left, Cancer (HCC) (10/20/2016), Carotid artery thrombosis, bilateral, Carotid  stenosis, Chronic bronchitis (HCC), Chronic insomnia, COPD (chronic obstructive pulmonary disease) (HCC), Coronary artery disease, CRI (chronic renal insufficiency), stage 3 (moderate) (HCC), Depression, Diabetes mellitus without complication (HCC), GERD (gastroesophageal reflux disease), Heart palpitations, History of AAA (abdominal aortic aneurysm) repair, History of colon polyps, History of TIA (transient ischemic attack), Hypercholesterolemia, Hypertension, Hypothyroidism, Migraine headache, Osteoarthritis, Osteoporosis, Peripheral arterial disease (HCC), Pulmonary scarring, Renal disorder, SOB (shortness of breath), Stenosis of left subclavian artery (HCC), Stroke (HCC), Thyroid disease, TIA (transient ischemic attack), and Type 2 diabetes mellitus with vascular disease (HCC). Surgical: Tamara Finley  has a past surgical history that includes Cardiac surgery; Abdominal aortic aneurysm repair; Cardiac catheterization (N/A, 12/05/2015); Cardiac catheterization (Left, 12/31/2015); Breast excisional biopsy (Left, 2010); Breast excisional biopsy (Left, 07/01/2006); Breast biopsy (Left, 09/03/2016); Breast biopsy (Left, 09/25/2016); Bronchoscopy; CAROTID PTA/STENT INTERVENTION; Colonoscopy; Eye surgery; Laminectomy (07/25/2013); Back surgery; Mediastinoscopy; Mastectomy; Thoracoscopy (12/16/2016); and Colonoscopy with propofol (N/A, 06/15/2017). Family: family history includes CVA in her mother; Cancer in her brother; Coronary artery disease in her father; Dementia in her father; Diabetes in her brother; Epilepsy in her mother; Hypertension in her father and mother; Ovarian cancer in her sister; Stroke in her father and mother.  Laboratory Chemistry Profile   Renal Lab Results  Component Value Date   BUN 17 12/18/2022   CREATININE 1.13 (H) 12/18/2022   GFRAA >60 01/01/2016   GFRNONAA 49 (L) 12/18/2022    Hepatic Lab Results  Component Value Date   AST 31 12/17/2022   ALT 14 12/17/2022   ALBUMIN 3.4 (L)  12/17/2022   ALKPHOS 48 12/17/2022    Electrolytes Lab Results  Component Value Date   NA 137 12/18/2022   K 4.4 12/18/2022   CL 107 12/18/2022   CALCIUM 7.4 (L) 12/18/2022    Bone No results found for: "VD25OH", "VD125OH2TOT", "WU9811BJ4", "NW2956OZ3", "25OHVITD1", "25OHVITD2", "25OHVITD3", "TESTOFREE", "TESTOSTERONE"  Inflammation (CRP: Acute Phase) (ESR: Chronic Phase) Lab Results  Component Value Date   ESRSEDRATE 23 10/12/2015   LATICACIDVEN 1.6 12/17/2022         Note: Above Lab results reviewed.  Recent Imaging Review  CT Angio Chest Pulmonary Embolism (PE) W or WO Contrast CLINICAL DATA:  Pulmonary embolism.  Weakness.  EXAM: CT ANGIOGRAPHY CHEST WITH CONTRAST  TECHNIQUE: Multidetector CT imaging of the chest was performed using the standard protocol during bolus administration of intravenous contrast. Multiplanar CT image reconstructions and MIPs were obtained to evaluate the vascular anatomy.  RADIATION DOSE REDUCTION: This exam was performed according to the departmental dose-optimization program which includes automated exposure control, adjustment of the mA and/or kV according to patient size and/or use of iterative reconstruction technique.  CONTRAST:  75mL OMNIPAQUE IOHEXOL 350 MG/ML SOLN  COMPARISON:  Chest x-ray 12/17/2022 earlier. Noncontrast chest CT 04/14/2014  FINDINGS: Cardiovascular: No pulmonary embolism identified. There is some enlargement of the pulmonary arteries. Please correlate for any evidence of pulmonary artery hypertension. Heart is nonenlarged. No pericardial effusion. Coronary artery calcifications are seen. The thoracic aorta has a normal course and caliber with scattered  atherosclerotic calcified plaque. There is significant plaque extending along the origin of the great vessels including brachiocephalic, bilateral subclavian arteries. Please correlate with any particular symptoms.  Mediastinum/Nodes: Mildly patulous  thoracic esophagus with a small hiatal hernia. No specific abnormal lymph node enlargement identified in the axillary region. There are some small bilateral hilar nodes. Example on the right measures 16 by 12 mm and left 15 x 13 mm.  Lungs/Pleura: Centrilobular emphysematous lung changes are identified particularly along the upper lung zones. Bilateral upper lobe areas of scarring and pleural thickening with calcification. Scattered areas of interstitial septal thickening. Additional areas of pleural thickening along the anterior left hemithorax. Trace bilateral pleural fluid with some adjacent presumed dependent atelectasis.  Upper Abdomen: The adrenal glands are preserved in the upper abdomen. Please correlate with the recent CT scan of the abdomen and pelvis from earlier on 12/17/2022.  Musculoskeletal: Scattered degenerative changes along the spine.  Review of the MIP images confirms the above findings.  IMPRESSION: No pulmonary embolism identified. There is some enlargement of the pulmonary arteries. Please correlate for evidence of pulmonary artery hypertension.  Chronic lung changes with centrilobular emphysematous change. There is significant apical pleural thickening with calcification. Associated scarring and fibrotic changes as well. These changes are progressive from the study of 2015.  Tiny pleural effusions.  Few enlarged bilateral hilar lymph nodes. This is of uncertain etiology. Short follow-up or additional workup when clinically appropriate.  Small hiatal hernia.  Significant calcified plaque along the aorta and branch vessels including areas of stenosis suggested along the brachiocephalic artery and bilateral subclavian arteries. Please correlate with any particular symptoms and if needed additional CT angiogram as clinically appropriate  Aortic Atherosclerosis (ICD10-I70.0) and Emphysema (ICD10-J43.9).  Electronically Signed   By: Karen Kays  M.D.   On: 12/18/2022 10:22 CT Head Wo Contrast CLINICAL DATA:  Altered mental status, weakness, dizziness  EXAM: CT HEAD WITHOUT CONTRAST  TECHNIQUE: Contiguous axial images were obtained from the base of the skull through the vertex without intravenous contrast.  RADIATION DOSE REDUCTION: This exam was performed according to the departmental dose-optimization program which includes automated exposure control, adjustment of the mA and/or kV according to patient size and/or use of iterative reconstruction technique.  COMPARISON:  09/16/2022  FINDINGS: Brain: No evidence of acute infarction, hemorrhage, hydrocephalus, extra-axial collection or mass lesion/mass effect.  Mild global cortical atrophy.  Subcortical white matter and periventricular small vessel ischemic changes.  Vascular: Intracranial atherosclerosis.  Skull: Normal. Negative for fracture or focal lesion.  Sinuses/Orbits: The visualized paranasal sinuses are essentially clear. The mastoid air cells are unopacified.  Other: None.  IMPRESSION: No acute intracranial abnormality.  Mild atrophy with small vessel ischemic changes.  Electronically Signed   By: Charline Bills M.D.   On: 12/18/2022 00:11 CT ABDOMEN PELVIS WO CONTRAST CLINICAL DATA:  Diarrhea, weakness  EXAM: CT ABDOMEN AND PELVIS WITHOUT CONTRAST  TECHNIQUE: Multidetector CT imaging of the abdomen and pelvis was performed following the standard protocol without IV contrast.  RADIATION DOSE REDUCTION: This exam was performed according to the departmental dose-optimization program which includes automated exposure control, adjustment of the mA and/or kV according to patient size and/or use of iterative reconstruction technique.  COMPARISON:  CTA abdomen/pelvis dated 06/23/2008  FINDINGS: Lower chest: Lung bases are clear.  Hepatobiliary: Unenhanced liver is unremarkable.  Mildly distended gallbladder. No intrahepatic or  extrahepatic duct dilatation.  Pancreas: Within normal limits.  Spleen: Within normal limits.  Adrenals/Urinary Tract: Adrenal glands are within normal  limits.  Right renal cortical atrophy. 2.0 cm left upper pole renal cyst (series 2/image 24), measuring simple fluid density, benign (Bosniak I). No follow-up is recommended. Renal vascular calcifications. No hydronephrosis.  Bladder is within normal limits.  Stomach/Bowel: Stomach is notable for a tiny hiatal hernia.  No evidence of bowel obstruction.  Appendix is not discretely visualized.  Segmental wall thickening involving the transverse colon (series 2/image 44), suggesting infectious/inflammatory colitis. No pneumatosis or free air.  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 Atherosclerotic calcifications of the abdominal aorta and branch vessels. Left renal stent.  No suspicious abdominopelvic lymphadenopathy.  Reproductive: Uterus is grossly unremarkable.  Bilateral ovaries are within normal limits.  Other: Suspected trace pelvic ascites (series 2/image 67).  Musculoskeletal: Degenerative changes of the visualized thoracolumbar spine.  IMPRESSION: Segmental wall thickening involving the transverse colon, suggesting infectious/inflammatory colitis.  Additional ancillary findings as above.  Electronically Signed   By: Charline Bills M.D.   On: 12/18/2022 00:08  Note: Reviewed        Physical Exam  General appearance: Well nourished, well developed, and well hydrated. In no apparent acute distress Mental status: Alert, oriented x 3 (person, place, & time)       Respiratory: No evidence of acute respiratory distress Eyes: PERLA Vitals: BP 102/60   Pulse 60   Temp (!) 97.2 F (36.2 C) (Temporal)   Resp 18   Ht 5\' 3"  (1.6 m)   Wt 190 lb (86.2 kg)   SpO2 100%   BMI 33.66 kg/m  BMI: Estimated body mass index is 33.66 kg/m as calculated from the  following:   Height as of this encounter: 5\' 3"  (1.6 m).   Weight as of this encounter: 190 lb (86.2 kg). Ideal: Ideal body weight: 52.4 kg (115 lb 8.3 oz) Adjusted ideal body weight: 65.9 kg (145 lb 5 oz)  Chronic low back pain  Assessment   Status Diagnosis  Controlled Controlled Controlled 1. Lumbar spondylosis   2. Lumbar facet arthropathy (L4/L5 and L5/S1)   3. Lumbar radiculopathy   4. Lumbar degenerative disc disease   5. Chronic SI joint pain   6. Chronic pain syndrome          Plan of Care    Tamara Finley has a current medication list which includes the following long-term medication(s): albuterol, albuterol, azelastine, calcium 600+d plus minerals, carvedilol, escitalopram, furosemide, gabapentin, levothyroxine, mirtazapine, and pantoprazole.  Pharmacotherapy (Medications Ordered): Meds ordered this encounter  Medications   HYDROcodone-acetaminophen (NORCO/VICODIN) 5-325 MG tablet    Sig: Take 1 tablet by mouth every 12 (twelve) hours as needed for severe pain. Must last 30 days.    Dispense:  60 tablet    Refill:  0    Chronic Pain: STOP Act (Not applicable) Fill 1 day early if closed on refill date. Avoid benzodiazepines within 8 hours of opioids   HYDROcodone-acetaminophen (NORCO/VICODIN) 5-325 MG tablet    Sig: Take 1 tablet by mouth every 12 (twelve) hours as needed for severe pain. Must last 30 days.    Dispense:  60 tablet    Refill:  0    Chronic Pain: STOP Act (Not applicable) Fill 1 day early if closed on refill date. Avoid benzodiazepines within 8 hours of opioids   Consider repeating lumbar facet medial branch nerve blocks that she had in 2019.  Orders Placed This Encounter  Procedures   ToxASSURE Select 13 (MW), Urine    Volume: 30 ml(s).  Minimum 3 ml of urine is needed. Document temperature of fresh sample. Indications: Long term (current) use of opiate analgesic (Z79.891)    Order Specific Question:   Release to patient    Answer:    Immediate     Follow-up plan:   Return for patient will call to schedule F2F appt prn.     consider RFA in future, Sprint PNS of medial branch at L4          Recent Visits Date Type Provider Dept  10/30/22 Office Visit Edward Jolly, MD [facility]  Showing recent visits within past 90 days and meeting all other requirements Today's Visits Date Type Provider Dept  01/20/23 Office Visit Edward Jolly, MD [facility]  Showing today's visits and meeting all other requirements Future Appointments No visits were found meeting these conditions. Showing future appointments within next 90 days and meeting all other requirements  I discussed the assessment and treatment plan with the patient. The patient was provided an opportunity to ask questions and all were answered. The patient agreed with the plan and demonstrated an understanding of the instructions.  Patient advised to call back or seek an in-person evaluation if the symptoms or condition worsens.  Duration of encounter: .  Note by: Edward Jolly, MD Date: 01/20/2023; Time: 1:34 PM

## 2023-01-22 DIAGNOSIS — K529 Noninfective gastroenteritis and colitis, unspecified: Secondary | ICD-10-CM | POA: Diagnosis not present

## 2023-01-22 DIAGNOSIS — E1151 Type 2 diabetes mellitus with diabetic peripheral angiopathy without gangrene: Secondary | ICD-10-CM | POA: Diagnosis not present

## 2023-01-22 DIAGNOSIS — B9620 Unspecified Escherichia coli [E. coli] as the cause of diseases classified elsewhere: Secondary | ICD-10-CM | POA: Diagnosis not present

## 2023-01-22 DIAGNOSIS — I701 Atherosclerosis of renal artery: Secondary | ICD-10-CM | POA: Diagnosis not present

## 2023-01-22 DIAGNOSIS — I2510 Atherosclerotic heart disease of native coronary artery without angina pectoris: Secondary | ICD-10-CM | POA: Diagnosis not present

## 2023-01-22 DIAGNOSIS — I129 Hypertensive chronic kidney disease with stage 1 through stage 4 chronic kidney disease, or unspecified chronic kidney disease: Secondary | ICD-10-CM | POA: Diagnosis not present

## 2023-01-22 DIAGNOSIS — N1832 Chronic kidney disease, stage 3b: Secondary | ICD-10-CM | POA: Diagnosis not present

## 2023-01-22 DIAGNOSIS — N390 Urinary tract infection, site not specified: Secondary | ICD-10-CM | POA: Diagnosis not present

## 2023-01-22 DIAGNOSIS — E1122 Type 2 diabetes mellitus with diabetic chronic kidney disease: Secondary | ICD-10-CM | POA: Diagnosis not present

## 2023-01-23 DIAGNOSIS — K529 Noninfective gastroenteritis and colitis, unspecified: Secondary | ICD-10-CM | POA: Diagnosis not present

## 2023-01-23 DIAGNOSIS — I2510 Atherosclerotic heart disease of native coronary artery without angina pectoris: Secondary | ICD-10-CM | POA: Diagnosis not present

## 2023-01-23 DIAGNOSIS — I129 Hypertensive chronic kidney disease with stage 1 through stage 4 chronic kidney disease, or unspecified chronic kidney disease: Secondary | ICD-10-CM | POA: Diagnosis not present

## 2023-01-23 DIAGNOSIS — E1122 Type 2 diabetes mellitus with diabetic chronic kidney disease: Secondary | ICD-10-CM | POA: Diagnosis not present

## 2023-01-23 DIAGNOSIS — E1151 Type 2 diabetes mellitus with diabetic peripheral angiopathy without gangrene: Secondary | ICD-10-CM | POA: Diagnosis not present

## 2023-01-23 DIAGNOSIS — B9620 Unspecified Escherichia coli [E. coli] as the cause of diseases classified elsewhere: Secondary | ICD-10-CM | POA: Diagnosis not present

## 2023-01-23 DIAGNOSIS — I701 Atherosclerosis of renal artery: Secondary | ICD-10-CM | POA: Diagnosis not present

## 2023-01-23 DIAGNOSIS — N390 Urinary tract infection, site not specified: Secondary | ICD-10-CM | POA: Diagnosis not present

## 2023-01-23 DIAGNOSIS — N1832 Chronic kidney disease, stage 3b: Secondary | ICD-10-CM | POA: Diagnosis not present

## 2023-01-28 DIAGNOSIS — I2510 Atherosclerotic heart disease of native coronary artery without angina pectoris: Secondary | ICD-10-CM | POA: Diagnosis not present

## 2023-01-28 DIAGNOSIS — I129 Hypertensive chronic kidney disease with stage 1 through stage 4 chronic kidney disease, or unspecified chronic kidney disease: Secondary | ICD-10-CM | POA: Diagnosis not present

## 2023-01-28 DIAGNOSIS — E1151 Type 2 diabetes mellitus with diabetic peripheral angiopathy without gangrene: Secondary | ICD-10-CM | POA: Diagnosis not present

## 2023-01-28 DIAGNOSIS — N1832 Chronic kidney disease, stage 3b: Secondary | ICD-10-CM | POA: Diagnosis not present

## 2023-01-28 DIAGNOSIS — E1122 Type 2 diabetes mellitus with diabetic chronic kidney disease: Secondary | ICD-10-CM | POA: Diagnosis not present

## 2023-01-28 DIAGNOSIS — I701 Atherosclerosis of renal artery: Secondary | ICD-10-CM | POA: Diagnosis not present

## 2023-01-28 DIAGNOSIS — K529 Noninfective gastroenteritis and colitis, unspecified: Secondary | ICD-10-CM | POA: Diagnosis not present

## 2023-01-28 DIAGNOSIS — B9620 Unspecified Escherichia coli [E. coli] as the cause of diseases classified elsewhere: Secondary | ICD-10-CM | POA: Diagnosis not present

## 2023-01-28 DIAGNOSIS — N390 Urinary tract infection, site not specified: Secondary | ICD-10-CM | POA: Diagnosis not present

## 2023-01-30 DIAGNOSIS — N390 Urinary tract infection, site not specified: Secondary | ICD-10-CM | POA: Diagnosis not present

## 2023-01-30 DIAGNOSIS — I2510 Atherosclerotic heart disease of native coronary artery without angina pectoris: Secondary | ICD-10-CM | POA: Diagnosis not present

## 2023-01-30 DIAGNOSIS — I701 Atherosclerosis of renal artery: Secondary | ICD-10-CM | POA: Diagnosis not present

## 2023-01-30 DIAGNOSIS — B9620 Unspecified Escherichia coli [E. coli] as the cause of diseases classified elsewhere: Secondary | ICD-10-CM | POA: Diagnosis not present

## 2023-01-30 DIAGNOSIS — E1151 Type 2 diabetes mellitus with diabetic peripheral angiopathy without gangrene: Secondary | ICD-10-CM | POA: Diagnosis not present

## 2023-01-30 DIAGNOSIS — E1122 Type 2 diabetes mellitus with diabetic chronic kidney disease: Secondary | ICD-10-CM | POA: Diagnosis not present

## 2023-01-30 DIAGNOSIS — K529 Noninfective gastroenteritis and colitis, unspecified: Secondary | ICD-10-CM | POA: Diagnosis not present

## 2023-01-30 DIAGNOSIS — N1832 Chronic kidney disease, stage 3b: Secondary | ICD-10-CM | POA: Diagnosis not present

## 2023-01-30 DIAGNOSIS — I129 Hypertensive chronic kidney disease with stage 1 through stage 4 chronic kidney disease, or unspecified chronic kidney disease: Secondary | ICD-10-CM | POA: Diagnosis not present

## 2023-02-06 ENCOUNTER — Ambulatory Visit (INDEPENDENT_AMBULATORY_CARE_PROVIDER_SITE_OTHER): Payer: Medicare HMO | Admitting: Vascular Surgery

## 2023-02-06 ENCOUNTER — Encounter (INDEPENDENT_AMBULATORY_CARE_PROVIDER_SITE_OTHER): Payer: Self-pay | Admitting: Vascular Surgery

## 2023-02-06 ENCOUNTER — Ambulatory Visit (INDEPENDENT_AMBULATORY_CARE_PROVIDER_SITE_OTHER): Payer: Medicare HMO

## 2023-02-06 VITALS — BP 165/81 | HR 66 | Resp 18 | Ht 63.0 in | Wt 168.0 lb

## 2023-02-06 DIAGNOSIS — I6523 Occlusion and stenosis of bilateral carotid arteries: Secondary | ICD-10-CM

## 2023-02-06 DIAGNOSIS — E785 Hyperlipidemia, unspecified: Secondary | ICD-10-CM | POA: Diagnosis not present

## 2023-02-06 DIAGNOSIS — I714 Abdominal aortic aneurysm, without rupture, unspecified: Secondary | ICD-10-CM | POA: Diagnosis not present

## 2023-02-06 DIAGNOSIS — I739 Peripheral vascular disease, unspecified: Secondary | ICD-10-CM

## 2023-02-06 DIAGNOSIS — I701 Atherosclerosis of renal artery: Secondary | ICD-10-CM

## 2023-02-06 DIAGNOSIS — E1159 Type 2 diabetes mellitus with other circulatory complications: Secondary | ICD-10-CM

## 2023-02-06 DIAGNOSIS — N183 Chronic kidney disease, stage 3 unspecified: Secondary | ICD-10-CM

## 2023-02-06 DIAGNOSIS — I10 Essential (primary) hypertension: Secondary | ICD-10-CM | POA: Diagnosis not present

## 2023-02-06 NOTE — Assessment & Plan Note (Signed)
Carotid duplex today shows stable 40 to 59% right ICA stenosis and 1 to 39% left ICA stenosis without significant progression from previous studies.  No role for intervention.  Continue current medical regimen.  Recheck in 1 year.

## 2023-02-06 NOTE — Assessment & Plan Note (Signed)
blood pressure control important in reducing the progression of atherosclerotic disease. On appropriate oral medications.  

## 2023-02-06 NOTE — Progress Notes (Signed)
MRN : [id number]  Tamara Finley is a 82 y.o. ([birth year]) female who presents with chief complaint of  Chief Complaint  Patient presents with   Follow-up    1 year renal + carotid  .  History of Present Illness: Patient returns today in follow up of multiple vascular issues.  She is doing well without any new complaints today.  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  Duplex today shows her left renal artery velocities to be relatively normal suggesting less than 60% disease.  Her left kidney length is stable.  Right kidney is atrophic. She is also followed for carotid disease.  No focal neurologic symptoms. Specifically, the patient denies amaurosis fugax, speech or swallowing difficulties, or arm or leg weakness or numbness. Carotid duplex today shows stable 40 to 59% right ICA stenosis and 1 to 39% left ICA stenosis without significant progression from previous studies.   Current Outpatient Medications  Medication Sig Dispense Refill   acetaminophen (TYLENOL) 500 MG tablet Take 1,000 mg by mouth every 8 (eight) hours as needed for moderate pain.      albuterol (PROVENTIL HFA;VENTOLIN HFA) 108 (90 BASE) MCG/ACT inhaler Inhale 2 puffs into the lungs every 6 (six) hours as needed for wheezing or shortness of breath. Reported on 12/31/2015     albuterol (PROVENTIL) (2.5 MG/3ML) 0.083% nebulizer solution Take 3 mLs (2.5 mg total) by nebulization every 4 (four) hours as needed for wheezing or shortness of breath. 75 mL 2   atorvastatin (LIPITOR) 40 MG tablet Take 40 mg by mouth daily at 6 PM.      azelastine (ASTELIN) 0.1 % nasal spray Place into both nostrils 2 (two) times daily. Use in each nostril as directed     Calcium Carbonate-Vit D-Min (CALCIUM 600+D PLUS MINERALS) 600-400 MG-UNIT TABS Take by mouth.     carvedilol (COREG) 6.25 MG tablet Take 6.25 mg by mouth 2 (two) times daily with a meal.     escitalopram (LEXAPRO) 5 MG tablet Take 5 mg by  mouth daily.     fluticasone (FLONASE) 50 MCG/ACT nasal spray Place 1-2 sprays into both nostrils daily as needed for rhinitis.     furosemide (LASIX) 40 MG tablet Take 1 tablet by mouth 1 day or 1 dose.     gabapentin (NEURONTIN) 300 MG capsule Take 2 capsules (600 mg total) by mouth at bedtime. 90 capsule 2   hydrALAZINE (APRESOLINE) 50 MG tablet Take 50 mg by mouth 2 (two) times daily.     [START ON 02/20/2023] HYDROcodone-acetaminophen (NORCO/VICODIN) 5-325 MG tablet Take 1 tablet by mouth every 12 (twelve) hours as needed for severe pain. Must last 30 days. 60 tablet 0   [START ON 03/22/2023] HYDROcodone-acetaminophen (NORCO/VICODIN) 5-325 MG tablet Take 1 tablet by mouth every 12 (twelve) hours as needed for severe pain. Must last 30 days. 60 tablet 0   levothyroxine (SYNTHROID) 88 MCG tablet Take 88 mcg by mouth daily before breakfast.     mirtazapine (REMERON) 15 MG tablet Take 15 mg by mouth at bedtime.  3   pantoprazole (PROTONIX) 40 MG tablet Take 40 mg by mouth daily.     potassium chloride SA (K-DUR,KLOR-CON) 20 MEQ tablet Take 20 mEq by mouth as directed.     vitamin B-12 (CYANOCOBALAMIN) 1000 MCG tablet Take 1 tablet by mouth 1 day or 1 dose.     No current facility-administered medications for this visit.    Past  Medical History:  Diagnosis Date   Abdominal aortic aneurysm (AAA) (HCC) 2000's   Age related entropion, unspecified laterality    Anxiety    Anxiety and depression    Bilateral renal artery stenosis (HCC)    Breast calcification, left    Cancer (HCC) 10/20/2016   breast cancer   Carotid artery thrombosis, bilateral    Carotid stenosis    Chronic bronchitis (HCC)    Chronic insomnia    COPD (chronic obstructive pulmonary disease) (HCC)    Coronary artery disease    CRI (chronic renal insufficiency), stage 3 (moderate) (HCC)    Depression    Diabetes mellitus without complication (HCC)    GERD (gastroesophageal reflux disease)    Heart palpitations     History of AAA (abdominal aortic aneurysm) repair    History of colon polyps    History of TIA (transient ischemic attack)    Hypercholesterolemia    Hypertension    Hypothyroidism    Migraine headache    Osteoarthritis    Osteoporosis    Peripheral arterial disease (HCC)    Pulmonary scarring    Renal disorder    SOB (shortness of breath)    Stenosis of left subclavian artery (HCC)    Stroke (HCC)    Thyroid disease    TIA (transient ischemic attack)    Type 2 diabetes mellitus with vascular disease (HCC)     Past Surgical History:  Procedure Laterality Date   ABDOMINAL AORTIC ANEURYSM REPAIR     BACK SURGERY     BREAST BIOPSY Left 09/03/2016   benign   BREAST BIOPSY Left 09/25/2016   path pending   BREAST EXCISIONAL BIOPSY Left 2010   BREAST EXCISIONAL BIOPSY Left 07/01/2006   BRONCHOSCOPY     CARDIAC SURGERY     CAROTID PTA/STENT INTERVENTION     COLONOSCOPY     COLONOSCOPY WITH PROPOFOL N/A 06/15/2017   Procedure: COLONOSCOPY WITH PROPOFOL;  Surgeon: Christena Deem, MD;  Location: [facility] ENDOSCOPY;  Service: Endoscopy;  Laterality: N/A;   EYE SURGERY     LAMINECTOMY  07/25/2013   MASTECTOMY     MEDIASTINOSCOPY     PERIPHERAL VASCULAR CATHETERIZATION N/A 12/05/2015   Procedure: Renal Angiography;  Surgeon: Renford Dills, MD;  Location: [facility] INVASIVE CV LAB;  Service: Cardiovascular;  Laterality: N/A;   PERIPHERAL VASCULAR CATHETERIZATION Left 12/31/2015   Procedure: Carotid PTA/Stent Intervention;  Surgeon: Annice Needy, MD;  Location: [facility] INVASIVE CV LAB;  Service: Cardiovascular;  Laterality: Left;   THORACOSCOPY  12/16/2016     Social History   Tobacco Use   Smoking status: Former    Packs/day: 2.00    Years: 50.00    Additional pack years: 0.00    Total pack years: 100.00    Types: Cigarettes    Quit date: 06/01/2013    Years since quitting: 9.6   Smokeless tobacco: Never  Vaping Use   Vaping Use: Never used  Substance Use Topics   Alcohol use:  No    Alcohol/week: 0.0 standard drinks of alcohol   Drug use: No      Family History  Problem Relation Age of Onset   CVA Mother    Hypertension Mother    Stroke Mother    Epilepsy Mother    Dementia Father    Stroke Father    Coronary artery disease Father    Hypertension Father    Ovarian cancer Sister    Cancer Brother    Diabetes  Brother      Allergies  Allergen Reactions   Benadryl [Diphenhydramine Hcl (Sleep)] Other (See Comments)    BAD DREAMS   Alprazolam Rash    "bad dreams"    REVIEW OF SYSTEMS (Negative unless checked)   Constitutional: [] Weight loss  [] Fever  [] Chills Cardiac: [] Chest pain   [] Chest pressure   [] Palpitations   [] Shortness of breath when laying flat   [] Shortness of breath at rest   [x] Shortness of breath with exertion. Vascular:  [] Pain in legs with walking   [] Pain in legs at rest   [] Pain in legs when laying flat   [] Claudication   [] Pain in feet when walking  [] Pain in feet at rest  [] Pain in feet when laying flat   [] History of DVT   [] Phlebitis   [x] Swelling in legs   [] Varicose veins   [] Non-healing ulcers Pulmonary:   [] Uses home oxygen   [] Productive cough   [] Hemoptysis   [] Wheeze  [x] COPD   [] Asthma Neurologic:  [] Dizziness  [] Blackouts   [] Seizures   [] History of stroke   [x] History of TIA  [] Aphasia   [] Temporary blindness   [] Dysphagia   [] Weakness or numbness in arms   [] Weakness or numbness in legs Musculoskeletal:  [x] Arthritis   [] Joint swelling   [] Joint pain   [] Low back pain Hematologic:  [] Easy bruising  [] Easy bleeding   [] Hypercoagulable state   [] Anemic   Gastrointestinal:  [] Blood in stool   [] Vomiting blood  [] Gastroesophageal reflux/heartburn   [] Abdominal pain Genitourinary:  [x] Chronic kidney disease   [] Difficult urination  [] Frequent urination  [] Burning with urination   [] Hematuria Skin:  [] Rashes   [] Ulcers   [] Wounds Psychological:  [] History of anxiety   []  History of major depression.  Physical  Examination  BP (!) 165/81 (BP Location: Left Arm)   Pulse 66   Resp 18   Ht 5\' 3"  (1.6 m)   Wt 168 lb (76.2 kg)   BMI 29.76 kg/m  Gen:  WD/WN, NAD Head: [place]/AT, No temporalis wasting. Ear/Nose/Throat: Hearing grossly intact, nares w/o erythema or drainage Eyes: Conjunctiva clear. Sclera non-icteric Neck: Supple.  Trachea midline Pulmonary:  Good air movement, no use of accessory muscles.  Cardiac: RRR, no JVD Vascular:  Vessel Right Left  Radial Palpable Palpable       Musculoskeletal: M/S 5/5 throughout.  No deformity or atrophy. No edema. Neurologic: Sensation grossly intact in extremities.  Symmetrical.  Speech is fluent.  Psychiatric: Judgment intact, Mood & affect appropriate for pt's clinical situation. Dermatologic: No rashes or ulcers noted.  No cellulitis or open wounds.      Labs Recent Results (from the past 2160 hour(s))  CBC with Differential     Status: Abnormal   Collection Time: 12/17/22 10:11 PM  Result Value Ref Range   WBC 11.3 (H) 4.0 - 10.5 K/uL   RBC 3.77 (L) 3.87 - 5.11 MIL/uL   Hemoglobin 11.6 (L) 12.0 - 15.0 g/dL   HCT 16.1 (L) 09.6 - 04.5 %   MCV 94.7 80.0 - 100.0 fL   MCH 30.8 26.0 - 34.0 pg   MCHC 32.5 30.0 - 36.0 g/dL   RDW 40.9 81.1 - 91.4 %   Platelets 142 (L) 150 - 400 K/uL   nRBC 0.0 0.0 - 0.2 %   Neutrophils Relative % 87 %   Neutro Abs 9.8 (H) 1.7 - 7.7 K/uL   Lymphocytes Relative 7 %   Lymphs Abs 0.7 0.7 - 4.0 K/uL   Monocytes Relative  6 %   Monocytes Absolute 0.7 0.1 - 1.0 K/uL   Eosinophils Relative 0 %   Eosinophils Absolute 0.0 0.0 - 0.5 K/uL   Basophils Relative 0 %   Basophils Absolute 0.0 0.0 - 0.1 K/uL   Immature Granulocytes 0 %   Abs Immature Granulocytes 0.03 0.00 - 0.07 K/uL    Comment: Performed at [facility], [address] [place], [place] [address]  Comprehensive metabolic panel     Status: Abnormal   Collection Time: 12/17/22 10:11 PM  Result Value Ref Range   Sodium 134 (L) 135 - 145 mmol/L    Potassium 4.2 3.5 - 5.1 mmol/L   Chloride 102 98 - 111 mmol/L   CO2 24 22 - 32 mmol/L   Glucose, Bld 98 70 - 99 mg/dL    Comment: Glucose reference range applies only to samples taken after fasting for at least 8 hours.   BUN 21 8 - 23 mg/dL   Creatinine, Ser 6.04 (H) 0.44 - 1.00 mg/dL   Calcium 8.2 (L) 8.9 - 10.3 mg/dL   Total Protein 6.3 (L) 6.5 - 8.1 g/dL   Albumin 3.4 (L) 3.5 - 5.0 g/dL   AST 31 15 - 41 U/L   ALT 14 0 - 44 U/L   Alkaline Phosphatase 48 38 - 126 U/L   Total Bilirubin 0.9 0.3 - 1.2 mg/dL   GFR, Estimated 42 (L) >60 mL/min    Comment: (NOTE) Calculated using the CKD-EPI Creatinine Equation (2021)    Anion gap 8 5 - 15    Comment: Performed at [facility], [address], [place], [place] [address]  Troponin I (High Sensitivity)     Status: None   Collection Time: 12/17/22 10:11 PM  Result Value Ref Range   Troponin I (High Sensitivity) 6 <18 ng/L    Comment: (NOTE) Elevated high sensitivity troponin I (hsTnI) values and significant  changes across serial measurements may suggest ACS but many other  chronic and acute conditions are known to elevate hsTnI results.  Refer to the "Links" section for chest pain algorithms and additional  guidance. Performed at [facility], [address] [place], [place] [address]   Brain natriuretic peptide     Status: Abnormal   Collection Time: 12/17/22 10:11 PM  Result Value Ref Range   B Natriuretic Peptide 229.6 (H) 0.0 - 100.0 pg/mL    Comment: Performed at [facility], [address] [place], [place] [address]  Procalcitonin     Status: None   Collection Time: 12/17/22 10:11 PM  Result Value Ref Range   Procalcitonin 5.76 ng/mL    Comment:        Interpretation: PCT > 2 ng/mL: Systemic infection (sepsis) is likely, unless other causes are known. (NOTE)       Sepsis PCT Algorithm           Lower Respiratory Tract                                      Infection PCT Algorithm     ----------------------------     ----------------------------         PCT < 0.25 ng/mL                PCT < 0.10 ng/mL          Strongly encourage  Strongly discourage   discontinuation of antibiotics    initiation of antibiotics    ----------------------------     -----------------------------       PCT 0.25 - 0.50 ng/mL            PCT 0.10 - 0.25 ng/mL               OR       >80% decrease in PCT            Discourage initiation of                                            antibiotics      Encourage discontinuation           of antibiotics    ----------------------------     -----------------------------         PCT >= 0.50 ng/mL              PCT 0.26 - 0.50 ng/mL               AND       <80% decrease in PCT              Encourage initiation of                                             antibiotics       Encourage continuation           of antibiotics    ----------------------------     -----------------------------        PCT >= 0.50 ng/mL                  PCT > 0.50 ng/mL               AND         increase in PCT                  Strongly encourage                                      initiation of antibiotics    Strongly encourage escalation           of antibiotics                                     -----------------------------                                           PCT <= 0.25 ng/mL                                                 OR                                        >  80% decrease in PCT                                      Discontinue / Do not initiate                                             antibiotics  Performed at [facility], [address] [place], [place] [address]   Resp panel by RT-PCR (RSV, Flu A&B, Covid) Anterior Nasal Swab     Status: None   Collection Time: 12/17/22 10:13 PM   Specimen: Anterior Nasal Swab  Result Value Ref Range   SARS Coronavirus 2 by RT PCR NEGATIVE NEGATIVE    Comment: (NOTE) SARS-CoV-2 target  nucleic acids are NOT DETECTED.  The SARS-CoV-2 RNA is generally detectable in upper respiratory specimens during the acute phase of infection. The lowest concentration of SARS-CoV-2 viral copies this assay can detect is 138 copies/mL. A negative result does not preclude SARS-Cov-2 infection and should not be used as the sole basis for treatment or other patient management decisions. A negative result may occur with  improper specimen collection/handling, submission of specimen other than nasopharyngeal swab, presence of viral mutation(s) within the areas targeted by this assay, and inadequate number of viral copies(<138 copies/mL). A negative result must be combined with clinical observations, patient history, and epidemiological information. The expected result is Negative.  Fact Sheet for Patients:  BloggerCourse.com  Fact Sheet for Healthcare Providers:  SeriousBroker.it  This test is no t yet approved or cleared by the [place] FDA and  has been authorized for detection and/or diagnosis of SARS-CoV-2 by FDA under an Emergency Use Authorization (EUA). This EUA will remain  in effect (meaning this test can be used) for the duration of the COVID-19 declaration under Section 564(b)(1) of the Act, 21 U.S.C.section 360bbb-3(b)(1), unless the authorization is terminated  or revoked sooner.       Influenza A by PCR NEGATIVE NEGATIVE   Influenza B by PCR NEGATIVE NEGATIVE    Comment: (NOTE) The Xpert Xpress SARS-CoV-2/FLU/RSV plus assay is intended as an aid in the diagnosis of influenza from Nasopharyngeal swab specimens and should not be used as a sole basis for treatment. Nasal washings and aspirates are unacceptable for Xpert Xpress SARS-CoV-2/FLU/RSV testing.  Fact Sheet for Patients: BloggerCourse.com  Fact Sheet for Healthcare Providers: SeriousBroker.it  This test  is not yet approved or cleared by the [place] FDA and has been authorized for detection and/or diagnosis of SARS-CoV-2 by FDA under an Emergency Use Authorization (EUA). This EUA will remain in effect (meaning this test can be used) for the duration of the COVID-19 declaration under Section 564(b)(1) of the Act, 21 U.S.C. section 360bbb-3(b)(1), unless the authorization is terminated or revoked.     Resp Syncytial Virus by PCR NEGATIVE NEGATIVE    Comment: (NOTE) Fact Sheet for Patients: BloggerCourse.com  Fact Sheet for Healthcare Providers: SeriousBroker.it  This test is not yet approved or cleared by the [place] FDA and has been authorized for detection and/or diagnosis of SARS-CoV-2 by FDA under an Emergency Use Authorization (EUA). This EUA will remain in effect (meaning this test can be used) for the duration of the COVID-19 declaration under Section 564(b)(1) of the Act, 21 U.S.C. section 360bbb-3(b)(1), unless the  authorization is terminated or revoked.  Performed at [facility], [address] [place], [place] [address]   Urinalysis, Routine w reflex microscopic -Urine, Clean Catch     Status: Abnormal   Collection Time: 12/17/22 11:26 PM  Result Value Ref Range   Color, Urine YELLOW (A) YELLOW   APPearance HAZY (A) CLEAR   Specific Gravity, Urine 1.011 1.005 - 1.030   pH 5.0 5.0 - 8.0   Glucose, UA NEGATIVE NEGATIVE mg/dL   Hgb urine dipstick NEGATIVE NEGATIVE   Bilirubin Urine NEGATIVE NEGATIVE   Ketones, ur NEGATIVE NEGATIVE mg/dL   Protein, ur NEGATIVE NEGATIVE mg/dL   Nitrite NEGATIVE NEGATIVE   Leukocytes,Ua MODERATE (A) NEGATIVE   RBC / HPF 0-5 0 - 5 RBC/hpf   WBC, UA 6-10 0 - 5 WBC/hpf   Bacteria, UA MANY (A) NONE SEEN   Squamous Epithelial / HPF 0-5 0 - 5 /HPF   WBC Clumps PRESENT    Mucus PRESENT    Hyaline Casts, UA PRESENT     Comment: Performed at [facility], [address] [place], [place] [address]  Urine Culture     Status: Abnormal   Collection Time: 12/17/22 11:26 PM   Specimen: Urine, Random  Result Value Ref Range   Specimen Description      URINE, RANDOM Performed at [facility], [address] [place], [place] [address]    Special Requests      NONE Performed at [facility], [address] [place], [place] [address]    Culture >=100,000 COLONIES/mL ESCHERICHIA COLI (A)    Report Status 12/21/2022 FINAL    Organism ID, Bacteria ESCHERICHIA COLI (A)       Susceptibility   Escherichia coli - MIC*    AMPICILLIN <=2 SENSITIVE Sensitive     CEFAZOLIN <=4 SENSITIVE Sensitive     CEFEPIME <=0.12 SENSITIVE Sensitive     CEFTRIAXONE <=0.25 SENSITIVE Sensitive     CIPROFLOXACIN <=0.25 SENSITIVE Sensitive     GENTAMICIN <=1 SENSITIVE Sensitive     IMIPENEM <=0.25 SENSITIVE Sensitive     NITROFURANTOIN <=16 SENSITIVE Sensitive     TRIMETH/SULFA <=20 SENSITIVE Sensitive     AMPICILLIN/SULBACTAM <=2 SENSITIVE Sensitive     PIP/TAZO <=4 SENSITIVE Sensitive     * >=100,000 COLONIES/mL ESCHERICHIA COLI  Culture, blood (routine x 2)     Status: None   Collection Time: 12/17/22 11:44 PM   Specimen: BLOOD  Result Value Ref Range   Specimen Description BLOOD RIGHT ASSIST CONTROL    Special Requests      BOTTLES DRAWN AEROBIC AND ANAEROBIC Blood Culture results may not be optimal due to an excessive volume of blood received in culture bottles   Culture      NO GROWTH 5 DAYS Performed at [facility], [address] [place], [place] [address]    Report Status 12/23/2022 FINAL   Culture, blood (routine x 2)     Status: None   Collection Time: 12/17/22 11:46 PM   Specimen: BLOOD  Result Value Ref Range   Specimen Description BLOOD LEFT ASSIST CONTROL    Special Requests      BOTTLES DRAWN AEROBIC AND ANAEROBIC Blood Culture adequate volume   Culture      NO GROWTH 5 DAYS Performed at [facility],  [address] [place], [place] [address]    Report Status 12/23/2022 FINAL   Lactic acid, plasma     Status: None  Collection Time: 12/17/22 11:46 PM  Result Value Ref Range   Lactic Acid, Venous 1.6 0.5 - 1.9 mmol/L    Comment: Performed at [facility], [address] [place], [place] [address]  Troponin I (High Sensitivity)     Status: None   Collection Time: 12/18/22 12:12 AM  Result Value Ref Range   Troponin I (High Sensitivity) 5 <18 ng/L    Comment: (NOTE) Elevated high sensitivity troponin I (hsTnI) values and significant  changes across serial measurements may suggest ACS but many other  chronic and acute conditions are known to elevate hsTnI results.  Refer to the "Links" section for chest pain algorithms and additional  guidance. Performed at [facility], [address] [place], [place] [address]   C Difficile Quick Screen w PCR reflex     Status: None   Collection Time: 12/18/22  4:49 AM   Specimen: STOOL  Result Value Ref Range   C Diff antigen NEGATIVE NEGATIVE   C Diff toxin NEGATIVE NEGATIVE   C Diff interpretation No C. difficile detected.     Comment: Performed at [facility], [address] [place], [place] [address]  Basic metabolic panel     Status: Abnormal   Collection Time: 12/18/22  4:49 AM  Result Value Ref Range   Sodium 137 135 - 145 mmol/L   Potassium 4.4 3.5 - 5.1 mmol/L   Chloride 107 98 - 111 mmol/L   CO2 23 22 - 32 mmol/L   Glucose, Bld 95 70 - 99 mg/dL    Comment: Glucose reference range applies only to samples taken after fasting for at least 8 hours.   BUN 17 8 - 23 mg/dL   Creatinine, Ser 8.46 (H) 0.44 - 1.00 mg/dL   Calcium 7.4 (L) 8.9 - 10.3 mg/dL   GFR, Estimated 49 (L) >60 mL/min    Comment: (NOTE) Calculated using the CKD-EPI Creatinine Equation (2021)    Anion gap 7 5 - 15    Comment: Performed at [facility], [address] [place], [place] [address]  CBC     Status: Abnormal    Collection Time: 12/18/22  4:49 AM  Result Value Ref Range   WBC 14.9 (H) 4.0 - 10.5 K/uL   RBC 3.19 (L) 3.87 - 5.11 MIL/uL   Hemoglobin 10.0 (L) 12.0 - 15.0 g/dL   HCT 28.4 (L) 13.2 - 44.0 %   MCV 95.9 80.0 - 100.0 fL   MCH 31.3 26.0 - 34.0 pg   MCHC 32.7 30.0 - 36.0 g/dL   RDW 10.2 72.5 - 36.6 %   Platelets 128 (L) 150 - 400 K/uL   nRBC 0.0 0.0 - 0.2 %    Comment: Performed at [facility], [address] [place], [place] [address]  Gastrointestinal Panel by PCR , Stool     Status: None   Collection Time: 12/18/22 11:00 AM   Specimen: Stool  Result Value Ref Range   Campylobacter species NOT DETECTED NOT DETECTED   Plesimonas shigelloides NOT DETECTED NOT DETECTED   Salmonella species NOT DETECTED NOT DETECTED   Yersinia enterocolitica NOT DETECTED NOT DETECTED   Vibrio species NOT DETECTED NOT DETECTED   Vibrio cholerae NOT DETECTED NOT DETECTED   Enteroaggregative E coli (EAEC) NOT DETECTED NOT DETECTED   Enteropathogenic E coli (EPEC) NOT DETECTED NOT DETECTED   Enterotoxigenic E coli (ETEC) NOT DETECTED NOT DETECTED   Shiga like toxin producing E coli (STEC) NOT DETECTED NOT DETECTED  Shigella/Enteroinvasive E coli (EIEC) NOT DETECTED NOT DETECTED   Cryptosporidium NOT DETECTED NOT DETECTED   Cyclospora cayetanensis NOT DETECTED NOT DETECTED   Entamoeba histolytica NOT DETECTED NOT DETECTED   Giardia lamblia NOT DETECTED NOT DETECTED   Adenovirus F40/41 NOT DETECTED NOT DETECTED   Astrovirus NOT DETECTED NOT DETECTED   Norovirus GI/GII NOT DETECTED NOT DETECTED   Rotavirus A NOT DETECTED NOT DETECTED   Sapovirus (I, II, IV, and V) NOT DETECTED NOT DETECTED    Comment: Performed at [facility], [address], [place], [place] [address]    Radiology No results found.  Assessment/Plan Type 2 diabetes mellitus with vascular disease (HCC) blood glucose control important in reducing the progression of atherosclerotic disease. Also, involved in  wound healing. On appropriate medications.     Hyperlipidemia, unspecified lipid control important in reducing the progression of atherosclerotic disease. Continue statin therapy   History of AAA (abdominal aortic aneurysm) repair Aortobiiliac graft appears patent by duplex at last check  Benign essential hypertension blood pressure control important in reducing the progression of atherosclerotic disease. On appropriate oral medications.   Renal artery stenosis (HCC) Duplex today shows her left renal artery velocities to be relatively normal suggesting less than 60% disease.  Her left kidney length is stable.  Right kidney is atrophic.  Doing well.  We will continue to follow on an annual basis.  Carotid atherosclerosis, bilateral Carotid duplex today shows stable 40 to 59% right ICA stenosis and 1 to 39% left ICA stenosis without significant progression from previous studies.  No role for intervention.  Continue current medical regimen.  Recheck in 1 year.  CRI (chronic renal insufficiency), stage 3 (moderate) Recent GFR was 49.    Festus Barren, MD  02/06/2023 12:21 PM    This note was created with Dragon medical transcription system.  Any errors from dictation are purely unintentional

## 2023-02-06 NOTE — Assessment & Plan Note (Signed)
Recent GFR was 49.

## 2023-02-06 NOTE — Assessment & Plan Note (Signed)
Duplex today shows her left renal artery velocities to be relatively normal suggesting less than 60% disease.  Her left kidney length is stable.  Right kidney is atrophic.  Doing well.  We will continue to follow on an annual basis.

## 2023-03-31 ENCOUNTER — Telehealth: Payer: Self-pay | Admitting: Student in an Organized Health Care Education/Training Program

## 2023-03-31 ENCOUNTER — Other Ambulatory Visit: Payer: Self-pay | Admitting: *Deleted

## 2023-03-31 MED ORDER — HYDROCODONE-ACETAMINOPHEN 5-325 MG PO TABS: 1.0000 | ORAL_TABLET | Freq: Two times a day (BID) | ORAL | 0 refills | Status: DC | PRN

## 2023-03-31 NOTE — Telephone Encounter (Signed)
PT current pharmacy is out of hydrocodone. PT daughter states that [facility] have it in stock. [facility] in [place], Please give patient daughter a call once prescription has been send in. [place]

## 2023-03-31 NOTE — Telephone Encounter (Signed)
Rx request sent to Dr. Lateef 

## 2023-03-31 NOTE — Telephone Encounter (Signed)
Attempted to call patient, the number given belongs to another person, no message left.

## 2023-04-14 DIAGNOSIS — R300 Dysuria: Secondary | ICD-10-CM | POA: Diagnosis not present

## 2023-04-21 ENCOUNTER — Encounter: Payer: Self-pay | Admitting: Student in an Organized Health Care Education/Training Program

## 2023-04-21 ENCOUNTER — Ambulatory Visit
Payer: Medicare HMO | Attending: Student in an Organized Health Care Education/Training Program | Admitting: Student in an Organized Health Care Education/Training Program

## 2023-04-21 VITALS — BP 189/79 | HR 65 | Temp 97.3°F | Resp 16 | Ht 64.0 in | Wt 166.4 lb

## 2023-04-21 DIAGNOSIS — M5136 Other intervertebral disc degeneration, lumbar region: Secondary | ICD-10-CM | POA: Diagnosis not present

## 2023-04-21 DIAGNOSIS — M5416 Radiculopathy, lumbar region: Secondary | ICD-10-CM | POA: Insufficient documentation

## 2023-04-21 DIAGNOSIS — M47816 Spondylosis without myelopathy or radiculopathy, lumbar region: Secondary | ICD-10-CM | POA: Diagnosis not present

## 2023-04-21 MED ORDER — HYDROCODONE-ACETAMINOPHEN 5-325 MG PO TABS: 1.0000 | ORAL_TABLET | Freq: Two times a day (BID) | ORAL | 0 refills | Status: AC | PRN

## 2023-04-21 MED ORDER — HYDROCODONE-ACETAMINOPHEN 5-325 MG PO TABS: 1.0000 | ORAL_TABLET | Freq: Two times a day (BID) | ORAL | 0 refills | Status: DC | PRN

## 2023-04-21 NOTE — Progress Notes (Signed)
Nursing Pain Medication Assessment:  Safety precautions to be maintained throughout the outpatient stay will include: orient to surroundings, keep bed in low position, maintain call bell within reach at all times, provide assistance with transfer out of bed and ambulation.  Medication Inspection Compliance: Pill count conducted under aseptic conditions, in front of the patient. Neither the pills nor the bottle was removed from the patient's sight at any time. Once count was completed pills were immediately returned to the patient in their original bottle.  Medication: Hydrocodone/APAP Pill/Patch Count:  49 of 60 pills remain Pill/Patch Appearance: Markings consistent with prescribed medication Bottle Appearance: Standard pharmacy container. Clearly labeled. Filled Date: 7 / 74 / 2024 Last Medication intake:  Yesterday

## 2023-04-21 NOTE — Progress Notes (Signed)
PROVIDER NOTE: Information contained herein reflects review and annotations entered in association with encounter. Interpretation of such information and data should be left to medically-trained personnel. Information provided to patient can be located elsewhere in the medical record under "Patient Instructions". Document created using STT-dictation technology, any transcriptional errors that may result from process are unintentional.    Patient: Tamara Finley  Service Category: E/M  Provider: Edward Jolly, MD  DOB: Sep 02, 1940  DOS: 04/21/2023  Specialty: Interventional Pain Management  MRN: [id number]  Setting: Ambulatory outpatient  PCP: Lynnea Ferrier, MD  Type: Established Patient    Referring Provider: Lynnea Ferrier, MD  Location: Office  Delivery: Face-to-face     HPI  Tamara Finley, a 82 y.o. year old female, is here today because of her Lumbar spondylosis [M47.816]. Tamara Finley primary complain today is Back Pain (lower)  Last encounter: My last encounter with her was on 10/30/22  Pertinent problems: Tamara Finley has Low back pain radiating to both legs; Peripheral arterial disease (HCC); Right hip pain; Chronic SI joint pain; Lumbar facet arthropathy (L4/L5 and L5/S1); Controlled substance agreement signed; Lumbar spondylosis; and Lumbar radiculopathy on their pertinent problem list. Pain Assessment: Severity of Chronic pain is reported as a 7 /10. Location: Back Lower/to left hip; to right hip and down right leg to calf; Right side is worse. Onset: More than a month ago. Quality: Aching, Dull, Numbness. Timing: Constant. Modifying factor(s): meds. Vitals:  height is 5\' 4"  (1.626 m) and weight is 166 lb 6.4 oz (75.5 kg). Her temperature is 97.3 F (36.3 C) (abnormal). Her blood pressure is 189/79 (abnormal) and her pulse is 65. Her respiration is 16 and oxygen saturation is 96%.   Reason for encounter: medication management.   She takes hydrocodone 5 mg once to twice daily as  needed.  She takes hydrocodone on a as needed basis and states that it does help her with her low back pain allows her to function and complete her ADLs.  She lives independently. She continues gabapentin 300 mg nightly   Pharmacotherapy Assessment  Analgesic: Hydrocodone 5 mg two times daily prn, quantity 60-->45/month    Monitoring: [place] PMP: PDMP reviewed during this encounter.       Pharmacotherapy: No side-effects or adverse reactions reported. Compliance: No problems identified. Effectiveness: Clinically acceptable.  Nonah Mattes, RN  04/21/2023  9:07 AM  Sign when Signing Visit Nursing Pain Medication Assessment:  Safety precautions to be maintained throughout the outpatient stay will include: orient to surroundings, keep bed in low position, maintain call bell within reach at all times, provide assistance with transfer out of bed and ambulation.  Medication Inspection Compliance: Pill count conducted under aseptic conditions, in front of the patient. Neither the pills nor the bottle was removed from the patient's sight at any time. Once count was completed pills were immediately returned to the patient in their original bottle.  Medication: Hydrocodone/APAP Pill/Patch Count:  49 of 60 pills remain Pill/Patch Appearance: Markings consistent with prescribed medication Bottle Appearance: Standard pharmacy container. Clearly labeled. Filled Date: 7 / 61 / 2024 Last Medication intake:  Yesterday     UDS:  Summary  Date Value Ref Range Status  04/30/2021 Note  Final    Comment:    ==================================================================== [employer] Select 13 (MW) ==================================================================== Test                             Result  Flag       Units  Drug Present and Declared for Prescription Verification   Norhydrocodone                 223          EXPECTED   ng/mg creat    Norhydrocodone is an expected metabolite of  hydrocodone.  Drug Absent but Declared for Prescription Verification   Hydrocodone                    Not Detected UNEXPECTED ng/mg creat    Hydrocodone is almost always present in patients taking this drug    consistently. Absence of hydrocodone could be due to lapse of time    since the last dose or unusual pharmacokinetics (rapid metabolism).  ==================================================================== Test                      Result    Flag   Units      Ref Range   Creatinine              26               mg/dL      >=09 ==================================================================== Declared Medications:  The flagging and interpretation on this report are based on the  following declared medications.  Unexpected results may arise from  inaccuracies in the declared medications.   **Note: The testing scope of this panel includes these medications:   Hydrocodone (Norco)   **Note: The testing scope of this panel does not include the  following reported medications:   Acetaminophen (Tylenol)  Acetaminophen (Norco)  Albuterol (Ventolin HFA)  Anastrozole (Arimidex)  Aspirin  Atorvastatin (Lipitor)  Azelastine (Astelin)  Calcium  Carvedilol (Coreg)  Cyanocobalamin  Fluticasone (Flonase)  Furosemide (Lasix)  Gabapentin (Neurontin)  Hydralazine (Apresoline)  Levothyroxine (Synthroid)  Mirtazapine (Remeron)  Montelukast (Singulair)  Pantoprazole (Protonix)  Potassium (Klor-Con)  Vitamin D ==================================================================== For clinical consultation, please call [phone]. ====================================================================      ROS  Constitutional: Denies any fever or chills Gastrointestinal: No reported hemesis, hematochezia, vomiting, or acute GI distress Musculoskeletal:  low back pain Neurological: No reported episodes of acute onset apraxia, aphasia, dysarthria, agnosia, amnesia, paralysis,  loss of coordination, or loss of consciousness  Medication Review  Calcium 600+D Plus Minerals, HYDROcodone-acetaminophen, acetaminophen, albuterol, atorvastatin, azelastine, carvedilol, cyanocobalamin, escitalopram, fluticasone, furosemide, gabapentin, hydrALAZINE, levothyroxine, mirtazapine, pantoprazole, and potassium chloride SA  History Review  Allergy: Ms. Asch is allergic to benadryl [diphenhydramine hcl (sleep)] and alprazolam. Drug: Ms. Stelmack  reports no history of drug use. Alcohol:  reports no history of alcohol use. Tobacco:  reports that she quit smoking about 9 years ago. Her smoking use included cigarettes. She started smoking about 59 years ago. She has a 100 pack-year smoking history. She has never used smokeless tobacco. Social: Ms. Santizo  reports that she quit smoking about 9 years ago. Her smoking use included cigarettes. She started smoking about 59 years ago. She has a 100 pack-year smoking history. She has never used smokeless tobacco. She reports that she does not drink alcohol and does not use drugs. Medical:  has a past medical history of Abdominal aortic aneurysm (AAA) (HCC) (2000's), Age related entropion, unspecified laterality, Anxiety, Anxiety and depression, Bilateral renal artery stenosis (HCC), Breast calcification, left, Cancer (HCC) (10/20/2016), Carotid artery thrombosis, bilateral, Carotid stenosis, Chronic bronchitis (HCC), Chronic insomnia, COPD (chronic obstructive pulmonary disease) (HCC), Coronary artery disease, CRI (chronic renal insufficiency), stage  3 (moderate) (HCC), Depression, Diabetes mellitus without complication (HCC), GERD (gastroesophageal reflux disease), Heart palpitations, History of AAA (abdominal aortic aneurysm) repair, History of colon polyps, History of TIA (transient ischemic attack), Hypercholesterolemia, Hypertension, Hypothyroidism, Migraine headache, Osteoarthritis, Osteoporosis, Peripheral arterial disease (HCC), Pulmonary scarring,  Renal disorder, SOB (shortness of breath), Stenosis of left subclavian artery (HCC), Stroke (HCC), Thyroid disease, TIA (transient ischemic attack), and Type 2 diabetes mellitus with vascular disease (HCC). Surgical: Ms. Cherrington  has a past surgical history that includes Cardiac surgery; Abdominal aortic aneurysm repair; Cardiac catheterization (N/A, 12/05/2015); Cardiac catheterization (Left, 12/31/2015); Breast excisional biopsy (Left, 2010); Breast excisional biopsy (Left, 07/01/2006); Breast biopsy (Left, 09/03/2016); Breast biopsy (Left, 09/25/2016); Bronchoscopy; CAROTID PTA/STENT INTERVENTION; Colonoscopy; Eye surgery; Laminectomy (07/25/2013); Back surgery; Mediastinoscopy; Mastectomy; Thoracoscopy (12/16/2016); and Colonoscopy with propofol (N/A, 06/15/2017). Family: family history includes CVA in her mother; Cancer in her brother; Coronary artery disease in her father; Dementia in her father; Diabetes in her brother; Epilepsy in her mother; Hypertension in her father and mother; Ovarian cancer in her sister; Stroke in her father and mother.  Laboratory Chemistry Profile   Renal Lab Results  Component Value Date   BUN 17 12/18/2022   CREATININE 1.13 (H) 12/18/2022   GFRAA >60 01/01/2016   GFRNONAA 49 (L) 12/18/2022    Hepatic Lab Results  Component Value Date   AST 31 12/17/2022   ALT 14 12/17/2022   ALBUMIN 3.4 (L) 12/17/2022   ALKPHOS 48 12/17/2022    Electrolytes Lab Results  Component Value Date   NA 137 12/18/2022   K 4.4 12/18/2022   CL 107 12/18/2022   CALCIUM 7.4 (L) 12/18/2022    Bone No results found for: "VD25OH", "VD125OH2TOT", "ON6295MW4", "XL2440NU2", "25OHVITD1", "25OHVITD2", "25OHVITD3", "TESTOFREE", "TESTOSTERONE"  Inflammation (CRP: Acute Phase) (ESR: Chronic Phase) Lab Results  Component Value Date   ESRSEDRATE 23 10/12/2015   LATICACIDVEN 1.6 12/17/2022         Note: Above Lab results reviewed.  Recent Imaging Review  VAS US CAROTID Carotid Arterial  Duplex Study  Patient Name:  BERTHEL BEU  Date of Exam:   02/06/2023 Medical Rec #: [id number]       Accession #:    [id number] Date of Birth: 09/07/1940      Patient Gender: F Patient Age:   56 years Exam Location:  [facility] Procedure:      VAS US CAROTID Referring Phys: Festus Barren  --------------------------------------------------------------------------------   Indications:       Carotid artery disease and Left stent. Other Factors:     04/17: Lt Renal Stent and Aorta Bi Iliac BPG.                      05/17: Carotid/Renal Stent. Comparison Study:  02/12/2022  Performing Technologist: Salvadore Farber RVT    Examination Guidelines: A complete evaluation includes B-mode imaging, spectral Doppler, color Doppler, and power Doppler as needed of all accessible portions of each vessel. Bilateral testing is considered an integral part of a complete examination. Limited examinations for reoccurring indications may be performed as noted.    Right Carotid Findings: +----------+--------+--------+--------+------------------+--------+           PSV cm/sEDV cm/sStenosisPlaque DescriptionComments +----------+--------+--------+--------+------------------+--------+ CCA Prox  60      12                                         +----------+--------+--------+--------+------------------+--------+  CCA Mid   70      20                                         +----------+--------+--------+--------+------------------+--------+ CCA Distal86      18                                         +----------+--------+--------+--------+------------------+--------+ ICA Prox  89      21                                         +----------+--------+--------+--------+------------------+--------+ ICA Mid   195     47                                         +----------+--------+--------+--------+------------------+--------+ ICA Distal119     27                                          +----------+--------+--------+--------+------------------+--------+ ECA       89      9                                          +----------+--------+--------+--------+------------------+--------+  +----------+--------+-------+--------+-------------------+           PSV cm/sEDV cmsDescribeArm Pressure (mmHG) +----------+--------+-------+--------+-------------------+ [id number]                                        +----------+--------+-------+--------+-------------------+  +---------+--------+---+--------+--+ VertebralPSV cm/s108EDV cm/s25 +---------+--------+---+--------+--+     Left Carotid Findings: +----------+--------+--------+--------+------------------+--------+           PSV cm/sEDV cm/sStenosisPlaque DescriptionComments +----------+--------+--------+--------+------------------+--------+ CCA Prox  60      19                                         +----------+--------+--------+--------+------------------+--------+ CCA Mid   61      17                                         +----------+--------+--------+--------+------------------+--------+ CCA Distal74      19                                         +----------+--------+--------+--------+------------------+--------+ ICA Prox  82      20                                         +----------+--------+--------+--------+------------------+--------+ ICA  Mid   92      26                                         +----------+--------+--------+--------+------------------+--------+ ICA Distal110     23                                         +----------+--------+--------+--------+------------------+--------+ ECA       143     9                                          +----------+--------+--------+--------+------------------+--------+  +----------+--------+--------+--------+-------------------+           PSV cm/sEDV  cm/sDescribeArm Pressure (mmHG) +----------+--------+--------+--------+-------------------+ [id number]                                         +----------+--------+--------+--------+-------------------+  +---------+--------+--+--------+--+ VertebralPSV cm/s46EDV cm/s13 +---------+--------+--+--------+--+        Summary: Right Carotid: Velocities in the right ICA are consistent with a 40-59%                stenosis. Non-hemodynamically significant plaque <50% noted in                the CCA. The ECA appears <50% stenosed.  Left Carotid: Velocities in the left ICA are consistent with a 1-39% stenosis.               Non-hemodynamically significant plaque <50% noted in the CCA. The               ECA appears >50% stenosed. Patent ICA stent.  Vertebrals:  Bilateral vertebral arteries demonstrate antegrade flow. Subclavians: Normal flow hemodynamics were seen in bilateral subclavian              arteries.  *See table(s) above for measurements and observations.    Electronically signed by Festus Barren MD on 02/12/2023 at 8:42:16 AM.      Final   VAS US RENAL ARTERY DUPLEX ABDOMINAL VISCERAL  Patient Name:  MAGGIEMAE DIGIOVANNA  Date of Exam:   02/06/2023 Medical Rec #: [id number]       Accession #:    [id number] Date of Birth: 05-18-1941      Patient Gender: F Patient Age:   89 years [facility] Procedure:      VAS US RENAL ARTERY DUPLEX Referring Phys: [id number] JASON S DEW  --------------------------------------------------------------------------------  Indications: Rt kidney atrophy.              RAS  Vascular Interventions: 04/17: Lt Renal Stent and Aorta Bi Iliac BPG.                           05/17: Carotid/Renal Stent.  Performing Technologist: Salvadore Farber RVT    Examination Guidelines: A complete evaluation includes B-mode imaging, spectral Doppler, color Doppler, and power Doppler as needed of all accessible portions of each  vessel. Bilateral testing is considered an integral part of a complete examination. Limited examinations for reoccurring indications may be performed as  noted.    Duplex Findings: +----------+--------+--------+------+--------+ MesentericPSV cm/sEDV cm/sPlaqueComments +----------+--------+--------+------+--------+ Aorta Mid    89                          +----------+--------+--------+------+--------+          +------------------+--------+--------+-------+ Right Renal ArteryPSV cm/sEDV cm/sComment +------------------+--------+--------+-------+ Proximal             81                   +------------------+--------+--------+-------+ Mid                 106                   +------------------+--------+--------+-------+ Distal               53                   +------------------+--------+--------+-------+  +-----------------+--------+--------+-------+ Left Renal ArteryPSV cm/sEDV cm/sComment +-----------------+--------+--------+-------+ Proximal            66                   +-----------------+--------+--------+-------+ Mid                 55                   +-----------------+--------+--------+-------+ Distal              60                   +-----------------+--------+--------+-------+  +------------+--------+--------+----+-----------+--------+--------+----+ Right KidneyPSV cm/sEDV cm/sRI  Left KidneyPSV cm/sEDV cm/sRI   +------------+--------+--------+----+-----------+--------+--------+----+ Upper Pole                      Upper Pole                      +------------+--------+--------+----+-----------+--------+--------+----+ Mid         38      1       0.        25      7       0.70 +------------+--------+--------+----+-----------+--------+--------+----+ Lower Pole                      Lower Pole                       +------------+--------+--------+----+-----------+--------+--------+----+ Hilar                           Hilar                           +------------+--------+--------+----+-----------+--------+--------+----+  +------------------+----+------------------+-----+ Right Kidney          Left Kidney             +------------------+----+------------------+-----+ RAR                   RAR                     +------------------+----+------------------+-----+ RAR (manual)      1.19RAR (manual)      .74   +------------------+----+------------------+-----+ Cortex                Cortex                  +------------------+----+------------------+-----+  Cortex thickness      Corex thickness         +------------------+----+------------------+-----+ Kidney length (cm)5.89Kidney length (cm)10.29 +------------------+----+------------------+-----+    Summary: Renal:   Right: Abnormal size for the right kidney. Abnormal right Resistive        Index. Abnormal cortical thickness of right kidney. 1-59%        stenosis of the right renal artery. RRV flow present. Left:  Normal size of left kidney. Normal left Resistive Index.        Normal cortical thickness of the left kidney. 1-59% stenosis        of the left renal artery. LRV flow present. Evidence of        patent LRA stent.   *See table(s) above for measurements and observations.   Diagnosing physician: Festus Barren MD   Electronically signed by Festus Barren MD on 02/12/2023 at 8:42:09 AM.       Final    Note: Reviewed        Physical Exam  General appearance: Well nourished, well developed, and well hydrated. In no apparent acute distress Mental status: Alert, oriented x 3 (person, place, & time)       Respiratory: No evidence of acute respiratory distress Eyes: PERLA Vitals: BP (!) 189/79   Pulse 65   Temp (!) 97.3 F (36.3 C)   Resp 16   Ht 5\' 4"  (1.626 m)   Wt 166 lb 6.4 oz (75.5 kg)   SpO2  96%   BMI 28.56 kg/m  BMI: Estimated body mass index is 28.56 kg/m as calculated from the following:   Height as of this encounter: 5\' 4"  (1.626 m).   Weight as of this encounter: 166 lb 6.4 oz (75.5 kg). Ideal: Ideal body weight: 54.7 kg (120 lb 9.5 oz) Adjusted ideal body weight: 63 kg (138 lb 14.6 oz)  Chronic low back pain  Assessment   Status Diagnosis  Controlled Controlled Controlled 1. Lumbar spondylosis   2. Lumbar facet arthropathy (L4/L5 and L5/S1)   3. Lumbar radiculopathy   4. Lumbar degenerative disc disease           Plan of Care    Ms. MESCAL HAWKEY has a current medication list which includes the following long-term medication(s): albuterol, albuterol, azelastine, calcium 600+d plus minerals, carvedilol, escitalopram, furosemide, gabapentin, levothyroxine, mirtazapine, and pantoprazole.  Pharmacotherapy (Medications Ordered): Meds ordered this encounter  Medications   HYDROcodone-acetaminophen (NORCO/VICODIN) 5-325 MG tablet    Sig: Take 1 tablet by mouth every 12 (twelve) hours as needed for severe pain. Must last 30 days.    Dispense:  45 tablet    Refill:  0    Chronic Pain: STOP Act (Not applicable) Fill 1 day early if closed on refill date. Avoid benzodiazepines within 8 hours of opioids   HYDROcodone-acetaminophen (NORCO/VICODIN) 5-325 MG tablet    Sig: Take 1 tablet by mouth every 12 (twelve) hours as needed for severe pain. Must last 30 days.    Dispense:  45 tablet    Refill:  0    Chronic Pain: STOP Act (Not applicable) Fill 1 day early if closed on refill date. Avoid benzodiazepines within 8 hours of opioids   HYDROcodone-acetaminophen (NORCO/VICODIN) 5-325 MG tablet    Sig: Take 1 tablet by mouth every 12 (twelve) hours as needed for severe pain. Must last 30 days.    Dispense:  45 tablet    Refill:  0    Chronic Pain: STOP Act (Not applicable)  Fill 1 day early if closed on refill date. Avoid benzodiazepines within 8 hours of opioids    Consider repeating lumbar facet medial branch nerve blocks that she had in 2019.  No orders of the defined types were placed in this encounter.    Follow-up plan:   Return in about 4 months (around 08/13/2023) for Medication Management, in person.     consider RFA in future, Sprint PNS of medial branch at L4          Recent Visits No visits were found meeting these conditions. Showing recent visits within past 90 days and meeting all other requirements Today's Visits Date Type Provider Dept  04/21/23 Office Visit Edward Jolly, MD [facility]  Showing today's visits and meeting all other requirements Future Appointments No visits were found meeting these conditions. Showing future appointments within next 90 days and meeting all other requirements  I discussed the assessment and treatment plan with the patient. The patient was provided an opportunity to ask questions and all were answered. The patient agreed with the plan and demonstrated an understanding of the instructions.  Patient advised to call back or seek an in-person evaluation if the symptoms or condition worsens.  Duration of encounter: .  Note by: Edward Jolly, MD Date: 04/21/2023; Time: 9:15 AM

## 2023-05-06 DIAGNOSIS — R7303 Prediabetes: Secondary | ICD-10-CM | POA: Diagnosis not present

## 2023-05-06 DIAGNOSIS — N1832 Chronic kidney disease, stage 3b: Secondary | ICD-10-CM | POA: Diagnosis not present

## 2023-05-06 DIAGNOSIS — E785 Hyperlipidemia, unspecified: Secondary | ICD-10-CM | POA: Diagnosis not present

## 2023-05-11 DIAGNOSIS — I129 Hypertensive chronic kidney disease with stage 1 through stage 4 chronic kidney disease, or unspecified chronic kidney disease: Secondary | ICD-10-CM | POA: Diagnosis not present

## 2023-05-11 DIAGNOSIS — E039 Hypothyroidism, unspecified: Secondary | ICD-10-CM | POA: Diagnosis not present

## 2023-05-11 DIAGNOSIS — I771 Stricture of artery: Secondary | ICD-10-CM | POA: Diagnosis not present

## 2023-05-11 DIAGNOSIS — J4489 Other specified chronic obstructive pulmonary disease: Secondary | ICD-10-CM | POA: Diagnosis not present

## 2023-05-11 DIAGNOSIS — I739 Peripheral vascular disease, unspecified: Secondary | ICD-10-CM | POA: Diagnosis not present

## 2023-05-11 DIAGNOSIS — E213 Hyperparathyroidism, unspecified: Secondary | ICD-10-CM | POA: Diagnosis not present

## 2023-05-11 DIAGNOSIS — Z9889 Other specified postprocedural states: Secondary | ICD-10-CM | POA: Diagnosis not present

## 2023-05-11 DIAGNOSIS — R7303 Prediabetes: Secondary | ICD-10-CM | POA: Diagnosis not present

## 2023-05-11 DIAGNOSIS — E785 Hyperlipidemia, unspecified: Secondary | ICD-10-CM | POA: Diagnosis not present

## 2023-05-11 DIAGNOSIS — Z0000 Encounter for general adult medical examination without abnormal findings: Secondary | ICD-10-CM | POA: Diagnosis not present

## 2023-05-11 DIAGNOSIS — Z1331 Encounter for screening for depression: Secondary | ICD-10-CM | POA: Diagnosis not present

## 2023-05-25 ENCOUNTER — Telehealth: Payer: Self-pay | Admitting: Student in an Organized Health Care Education/Training Program

## 2023-05-25 NOTE — Telephone Encounter (Signed)
PT daughter called states that [facility] is currently out of hydrocodone and will like for patient prescriptions to be send to [facility] in [place]. Please give daughter a call once prescription has been send in. [place]

## 2023-05-25 NOTE — Telephone Encounter (Signed)
ERROR

## 2023-05-26 ENCOUNTER — Other Ambulatory Visit: Payer: Self-pay

## 2023-05-26 MED ORDER — HYDROCODONE-ACETAMINOPHEN 5-325 MG PO TABS: 1.0000 | ORAL_TABLET | Freq: Two times a day (BID) | ORAL | 0 refills | Status: AC | PRN

## 2023-05-26 NOTE — Telephone Encounter (Signed)
Refill request sent to Dr Cherylann Ratel.

## 2023-08-06 ENCOUNTER — Encounter: Payer: Self-pay | Admitting: Student in an Organized Health Care Education/Training Program

## 2023-08-06 ENCOUNTER — Ambulatory Visit
Payer: Medicare HMO | Attending: Student in an Organized Health Care Education/Training Program | Admitting: Student in an Organized Health Care Education/Training Program

## 2023-08-06 VITALS — BP 148/74 | HR 63 | Temp 97.3°F | Ht 64.0 in | Wt 166.0 lb

## 2023-08-06 DIAGNOSIS — G894 Chronic pain syndrome: Secondary | ICD-10-CM | POA: Diagnosis not present

## 2023-08-06 DIAGNOSIS — M5136 Other intervertebral disc degeneration, lumbar region with discogenic back pain only: Secondary | ICD-10-CM | POA: Insufficient documentation

## 2023-08-06 DIAGNOSIS — M5416 Radiculopathy, lumbar region: Secondary | ICD-10-CM | POA: Insufficient documentation

## 2023-08-06 DIAGNOSIS — M47816 Spondylosis without myelopathy or radiculopathy, lumbar region: Secondary | ICD-10-CM | POA: Diagnosis not present

## 2023-08-06 MED ORDER — HYDROCODONE-ACETAMINOPHEN 5-325 MG PO TABS: 1.0000 | ORAL_TABLET | Freq: Two times a day (BID) | ORAL | 0 refills | Status: AC | PRN

## 2023-08-06 MED ORDER — HYDROCODONE-ACETAMINOPHEN 5-325 MG PO TABS: 1.0000 | ORAL_TABLET | Freq: Two times a day (BID) | ORAL | 0 refills | Status: DC | PRN

## 2023-08-06 NOTE — Progress Notes (Signed)
Nursing Pain Medication Assessment:  Safety precautions to be maintained throughout the outpatient stay will include: orient to surroundings, keep bed in low position, maintain call bell within reach at all times, provide assistance with transfer out of bed and ambulation.  Medication Inspection Compliance: Pill count conducted under aseptic conditions, in front of the patient. Neither the pills nor the bottle was removed from the patient's sight at any time. Once count was completed pills were immediately returned to the patient in their original bottle.  Medication: Hydrocodone/APAP Pill/Patch Count:  29 of 49 pills remain Pill/Patch Appearance: Markings consistent with prescribed medication Bottle Appearance: Standard pharmacy container. Clearly labeled. Filled Date: 45 / 20 / 2024 Last Medication intake:  Day before yesterdaySafety precautions to be maintained throughout the outpatient stay will include: orient to surroundings, keep bed in low position, maintain call bell within reach at all times, provide assistance with transfer out of bed and ambulation.

## 2023-08-06 NOTE — Progress Notes (Signed)
PROVIDER NOTE: Information contained herein reflects review and annotations entered in association with encounter. Interpretation of such information and data should be left to medically-trained personnel. Information provided to patient can be located elsewhere in the medical record under "Patient Instructions". Document created using STT-dictation technology, any transcriptional errors that may result from process are unintentional.    Patient: Tamara Finley  Service Category: E/M  Provider: Edward Jolly, MD  DOB: Jun 23, 1941  DOS: 08/06/2023  Specialty: Interventional Pain Management  MRN: [id number]  Setting: Ambulatory outpatient  PCP: Lynnea Ferrier, MD  Type: Established Patient    Referring Provider: Lynnea Ferrier, MD  Location: Office  Delivery: Face-to-face     HPI  Tamara Finley, a 82 y.o. year old female, is here today because of her Lumbar spondylosis [M47.816]. [name] primary complain today is Back Pain (right)  Last encounter: My last encounter with her was on 04/21/23  Pertinent problems: Tamara Finley has Low back pain radiating to both legs; Peripheral arterial disease (HCC); Right hip pain; Chronic SI joint pain; Lumbar facet arthropathy (L4/L5 and L5/S1); Controlled substance agreement signed; Lumbar spondylosis; and Lumbar radiculopathy on their pertinent problem list. Pain Assessment: Severity of Chronic pain is reported as a 5 /10. Location: Back Right/pain radiaties down her right leg to her ankle. Onset: More than a month ago. Quality: Aching, Dull, Shooting, Sharp, Discomfort, Constant. Timing: Constant. Modifying factor(s): Meds, laying down or sitting. Vitals:  height is 5\' 4"  (1.626 m) and weight is 166 lb (75.3 kg). Her temperature is 97.3 F (36.3 C) (abnormal). Her blood pressure is 148/74 (abnormal) and her pulse is 63. Her oxygen saturation is 96%.   Reason for encounter: medication management.   She takes hydrocodone 5 mg once to twice daily as  needed.  She takes hydrocodone on a as needed basis and states that it does help her with her low back pain allows her to function and complete her ADLs.  She lives independently.    Pharmacotherapy Assessment  Analgesic: Hydrocodone 5 mg two times daily prn, quantity 45/month    Monitoring: [place] PMP: PDMP reviewed during this encounter.       Pharmacotherapy: No side-effects or adverse reactions reported. Compliance: No problems identified. Effectiveness: Clinically acceptable.  Brigitte Pulse, RN  08/06/2023  9:44 AM  Sign when Signing Visit Nursing Pain Medication Assessment:  Safety precautions to be maintained throughout the outpatient stay will include: orient to surroundings, keep bed in low position, maintain call bell within reach at all times, provide assistance with transfer out of bed and ambulation.  Medication Inspection Compliance: Pill count conducted under aseptic conditions, in front of the patient. Neither the pills nor the bottle was removed from the patient's sight at any time. Once count was completed pills were immediately returned to the patient in their original bottle.  Medication: Hydrocodone/APAP Pill/Patch Count:  29 of 49 pills remain Pill/Patch Appearance: Markings consistent with prescribed medication Bottle Appearance: Standard pharmacy container. Clearly labeled. Filled Date: 73 / 20 / 2024 Last Medication intake:  Day before yesterdaySafety precautions to be maintained throughout the outpatient stay will include: orient to surroundings, keep bed in low position, maintain call bell within reach at all times, provide assistance with transfer out of bed and ambulation.      UDS:  Summary  Date Value Ref Range Status  04/30/2021 Note  Final    Comment:    ==================================================================== ToxASSURE Select 13 (MW) ==================================================================== Test  Result        Flag       Units  Drug Present and Declared for Prescription Verification   Norhydrocodone                 223          EXPECTED   ng/mg creat    Norhydrocodone is an expected metabolite of hydrocodone.  Drug Absent but Declared for Prescription Verification   Hydrocodone                    Not Detected UNEXPECTED ng/mg creat    Hydrocodone is almost always present in patients taking this drug    consistently. Absence of hydrocodone could be due to lapse of time    since the last dose or unusual pharmacokinetics (rapid metabolism).  ==================================================================== Test                      Result    Flag   Units      Ref Range   Creatinine              26               mg/dL      >=16 ==================================================================== Declared Medications:  The flagging and interpretation on this report are based on the  following declared medications.  Unexpected results may arise from  inaccuracies in the declared medications.   **Note: The testing scope of this panel includes these medications:   Hydrocodone (Norco)   **Note: The testing scope of this panel does not include the  following reported medications:   Acetaminophen (Tylenol)  Acetaminophen (Norco)  Albuterol (Ventolin HFA)  Anastrozole (Arimidex)  Aspirin  Atorvastatin (Lipitor)  Azelastine (Astelin)  Calcium  Carvedilol (Coreg)  Cyanocobalamin  Fluticasone (Flonase)  Furosemide (Lasix)  Gabapentin (Neurontin)  Hydralazine (Apresoline)  Levothyroxine (Synthroid)  Mirtazapine (Remeron)  Montelukast (Singulair)  Pantoprazole (Protonix)  Potassium (Klor-Con)  Vitamin D ==================================================================== For clinical consultation, please call [phone]. ====================================================================      ROS  Constitutional: Denies any fever or chills Gastrointestinal: No reported  hemesis, hematochezia, vomiting, or acute GI distress Musculoskeletal:  low back pain Neurological: No reported episodes of acute onset apraxia, aphasia, dysarthria, agnosia, amnesia, paralysis, loss of coordination, or loss of consciousness  Medication Review  Calcium 600+D Plus Minerals, HYDROcodone-acetaminophen, acetaminophen, albuterol, atorvastatin, azelastine, carvedilol, cyanocobalamin, escitalopram, fluticasone, furosemide, gabapentin, hydrALAZINE, levothyroxine, mirtazapine, pantoprazole, and potassium chloride SA  History Review  Allergy: Ms. Dech is allergic to benadryl [diphenhydramine hcl (sleep)] and alprazolam. Drug: Ms. Auger  reports no history of drug use. Alcohol:  reports no history of alcohol use. Tobacco:  reports that she quit smoking about 10 years ago. Her smoking use included cigarettes. She started smoking about 60 years ago. She has a 100 pack-year smoking history. She has never used smokeless tobacco. Social: Ms. Haag  reports that she quit smoking about 10 years ago. Her smoking use included cigarettes. She started smoking about 60 years ago. She has a 100 pack-year smoking history. She has never used smokeless tobacco. She reports that she does not drink alcohol and does not use drugs. Medical:  has a past medical history of Abdominal aortic aneurysm (AAA) (HCC) (2000's), Age related entropion, unspecified laterality, Anxiety, Anxiety and depression, Bilateral renal artery stenosis (HCC), Breast calcification, left, Cancer (HCC) (10/20/2016), Carotid artery thrombosis, bilateral, Carotid stenosis, Chronic bronchitis (HCC), Chronic insomnia, COPD (chronic obstructive pulmonary disease) (HCC), Coronary  artery disease, CRI (chronic renal insufficiency), stage 3 (moderate) (HCC), Depression, Diabetes mellitus without complication (HCC), GERD (gastroesophageal reflux disease), Heart palpitations, History of AAA (abdominal aortic aneurysm) repair, History of colon polyps,  History of TIA (transient ischemic attack), Hypercholesterolemia, Hypertension, Hypothyroidism, Migraine headache, Osteoarthritis, Osteoporosis, Peripheral arterial disease (HCC), Pulmonary scarring, Renal disorder, SOB (shortness of breath), Stenosis of left subclavian artery (HCC), Stroke (HCC), Thyroid disease, TIA (transient ischemic attack), and Type 2 diabetes mellitus with vascular disease (HCC). Surgical: Ms. Lagrange  has a past surgical history that includes Cardiac surgery; Abdominal aortic aneurysm repair; Cardiac catheterization (N/A, 12/05/2015); Cardiac catheterization (Left, 12/31/2015); Breast excisional biopsy (Left, 2010); Breast excisional biopsy (Left, 07/01/2006); Breast biopsy (Left, 09/03/2016); Breast biopsy (Left, 09/25/2016); Bronchoscopy; CAROTID PTA/STENT INTERVENTION; Colonoscopy; Eye surgery; Laminectomy (07/25/2013); Back surgery; Mediastinoscopy; Mastectomy; Thoracoscopy (12/16/2016); and Colonoscopy with propofol (N/A, 06/15/2017). Family: family history includes CVA in her mother; Cancer in her brother; Coronary artery disease in her father; Dementia in her father; Diabetes in her brother; Epilepsy in her mother; Hypertension in her father and mother; Ovarian cancer in her sister; Stroke in her father and mother.  Laboratory Chemistry Profile   Renal Lab Results  Component Value Date   BUN 17 12/18/2022   CREATININE 1.13 (H) 12/18/2022   GFRAA >60 01/01/2016   GFRNONAA 49 (L) 12/18/2022    Hepatic Lab Results  Component Value Date   AST 31 12/17/2022   ALT 14 12/17/2022   ALBUMIN 3.4 (L) 12/17/2022   ALKPHOS 48 12/17/2022    Electrolytes Lab Results  Component Value Date   NA 137 12/18/2022   K 4.4 12/18/2022   CL 107 12/18/2022   CALCIUM 7.4 (L) 12/18/2022    Bone No results found for: "VD25OH", "VD125OH2TOT", "NF6213YQ6", "VH8469GE9", "25OHVITD1", "25OHVITD2", "25OHVITD3", "TESTOFREE", "TESTOSTERONE"  Inflammation (CRP: Acute Phase) (ESR: Chronic  Phase) Lab Results  Component Value Date   ESRSEDRATE 23 10/12/2015   LATICACIDVEN 1.6 12/17/2022         Note: Above Lab results reviewed.  Recent Imaging Review  VAS US CAROTID Carotid Arterial Duplex Study  Patient Name:  ELLAYNA TURBIN  Date of Exam:   02/06/2023 Medical Rec #: [id number]       Accession #:    [id number] Date of Birth: [birth year]      Patient Gender: F Patient Age:   49 years Exam Location:  [facility] Procedure:      VAS US CAROTID Referring Phys: Festus Barren  --------------------------------------------------------------------------------   Indications:       Carotid artery disease and Left stent. Other Factors:     04/17: Lt Renal Stent and Aorta Bi Iliac BPG.                      05/17: Carotid/Renal Stent. Comparison Study:  02/12/2022  Performing Technologist: Salvadore Farber RVT    Examination Guidelines: A complete evaluation includes B-mode imaging, spectral Doppler, color Doppler, and power Doppler as needed of all accessible portions of each vessel. Bilateral testing is considered an integral part of a complete examination. Limited examinations for reoccurring indications may be performed as noted.    Right Carotid Findings: +----------+--------+--------+--------+------------------+--------+           PSV cm/sEDV cm/sStenosisPlaque DescriptionComments +----------+--------+--------+--------+------------------+--------+ CCA Prox  60      12                                         +----------+--------+--------+--------+------------------+--------+  CCA Mid   70      20                                         +----------+--------+--------+--------+------------------+--------+ CCA Distal86      18                                         +----------+--------+--------+--------+------------------+--------+ ICA Prox  89      21                                          +----------+--------+--------+--------+------------------+--------+ ICA Mid   195     47                                         +----------+--------+--------+--------+------------------+--------+ ICA Distal119     27                                         +----------+--------+--------+--------+------------------+--------+ ECA       89      9                                          +----------+--------+--------+--------+------------------+--------+  +----------+--------+-------+--------+-------------------+           PSV cm/sEDV cmsDescribeArm Pressure (mmHG) +----------+--------+-------+--------+-------------------+ [id number]                                        +----------+--------+-------+--------+-------------------+  +---------+--------+---+--------+--+ VertebralPSV cm/s108EDV cm/s25 +---------+--------+---+--------+--+     Left Carotid Findings: +----------+--------+--------+--------+------------------+--------+           PSV cm/sEDV cm/sStenosisPlaque DescriptionComments +----------+--------+--------+--------+------------------+--------+ CCA Prox  60      19                                         +----------+--------+--------+--------+------------------+--------+ CCA Mid   61      17                                         +----------+--------+--------+--------+------------------+--------+ CCA Distal74      19                                         +----------+--------+--------+--------+------------------+--------+ ICA Prox  82      20                                         +----------+--------+--------+--------+------------------+--------+ ICA  Mid   92      26                                         +----------+--------+--------+--------+------------------+--------+ ICA Distal110     23                                          +----------+--------+--------+--------+------------------+--------+ ECA       143     9                                          +----------+--------+--------+--------+------------------+--------+  +----------+--------+--------+--------+-------------------+           PSV cm/sEDV cm/sDescribeArm Pressure (mmHG) +----------+--------+--------+--------+-------------------+ [id number]                                         +----------+--------+--------+--------+-------------------+  +---------+--------+--+--------+--+ VertebralPSV cm/s46EDV cm/s13 +---------+--------+--+--------+--+        Summary: Right Carotid: Velocities in the right ICA are consistent with a 40-59%                stenosis. Non-hemodynamically significant plaque <50% noted in                the CCA. The ECA appears <50% stenosed.  Left Carotid: Velocities in the left ICA are consistent with a 1-39% stenosis.               Non-hemodynamically significant plaque <50% noted in the CCA. The               ECA appears >50% stenosed. Patent ICA stent.  Vertebrals:  Bilateral vertebral arteries demonstrate antegrade flow. Subclavians: Normal flow hemodynamics were seen in bilateral subclavian              arteries.  *See table(s) above for measurements and observations.    Electronically signed by Festus Barren MD on 02/12/2023 at 8:42:16 AM.      Final   VAS US RENAL ARTERY DUPLEX ABDOMINAL VISCERAL  Patient Name:  LERISSA MCCLAREN  Date of Exam:   02/06/2023 Medical Rec #: [id number]       Accession #:    [id number] Date of Birth: 08-31-1941      Patient Gender: F Patient Age:   5 years Exam Location:  [facility] Procedure:      VAS US RENAL ARTERY DUPLEX Referring Phys: [id number] JASON S DEW  --------------------------------------------------------------------------------  Indications: Rt kidney atrophy.              RAS  Vascular Interventions: 04/17: Lt Renal Stent  and Aorta Bi Iliac BPG.                           05/17: Carotid/Renal Stent.  Performing Technologist: Salvadore Farber RVT    Examination Guidelines: A complete evaluation includes B-mode imaging, spectral Doppler, color Doppler, and power Doppler as needed of all accessible portions of each vessel. Bilateral testing is considered an integral part of a complete examination. Limited examinations for reoccurring indications may be performed as  noted.    Duplex Findings: +----------+--------+--------+------+--------+ MesentericPSV cm/sEDV cm/sPlaqueComments +----------+--------+--------+------+--------+ Aorta Mid    89                          +----------+--------+--------+------+--------+          +------------------+--------+--------+-------+ Right Renal ArteryPSV cm/sEDV cm/sComment +------------------+--------+--------+-------+ Proximal             81                   +------------------+--------+--------+-------+ Mid                 106                   +------------------+--------+--------+-------+ Distal               53                   +------------------+--------+--------+-------+  +-----------------+--------+--------+-------+ Left Renal ArteryPSV cm/sEDV cm/sComment +-----------------+--------+--------+-------+ Proximal            66                   +-----------------+--------+--------+-------+ Mid                 55                   +-----------------+--------+--------+-------+ Distal              60                   +-----------------+--------+--------+-------+  +------------+--------+--------+----+-----------+--------+--------+----+ Right KidneyPSV cm/sEDV cm/sRI  Left KidneyPSV cm/sEDV cm/sRI   +------------+--------+--------+----+-----------+--------+--------+----+ Upper Pole                      Upper Pole                       +------------+--------+--------+----+-----------+--------+--------+----+ Mid         38      1       0.        25      7       0.70 +------------+--------+--------+----+-----------+--------+--------+----+ Lower Pole                      Lower Pole                      +------------+--------+--------+----+-----------+--------+--------+----+ Hilar                           Hilar                           +------------+--------+--------+----+-----------+--------+--------+----+  +------------------+----+------------------+-----+ Right Kidney          Left Kidney             +------------------+----+------------------+-----+ RAR                   RAR                     +------------------+----+------------------+-----+ RAR (manual)      1.19RAR (manual)      .74   +------------------+----+------------------+-----+ Cortex                Cortex                  +------------------+----+------------------+-----+  Cortex thickness      Corex thickness         +------------------+----+------------------+-----+ Kidney length (cm)5.89Kidney length (cm)10.29 +------------------+----+------------------+-----+    Summary: Renal:   Right: Abnormal size for the right kidney. Abnormal right Resistive        Index. Abnormal cortical thickness of right kidney. 1-59%        stenosis of the right renal artery. RRV flow present. Left:  Normal size of left kidney. Normal left Resistive Index.        Normal cortical thickness of the left kidney. 1-59% stenosis        of the left renal artery. LRV flow present. Evidence of        patent LRA stent.   *See table(s) above for measurements and observations.   Diagnosing physician: Festus Barren MD   Electronically signed by Festus Barren MD on 02/12/2023 at 8:42:09 AM.       Final    Note: Reviewed        Physical Exam  General appearance: Well nourished, well developed, and well hydrated. In  no apparent acute distress Mental status: Alert, oriented x 3 (person, place, & time)       Respiratory: No evidence of acute respiratory distress Eyes: PERLA Vitals: BP (!) 148/74   Pulse 63   Temp (!) 97.3 F (36.3 C)   Ht 5\' 4"  (1.626 m)   Wt 166 lb (75.3 kg)   SpO2 96%   BMI 28.49 kg/m  BMI: Estimated body mass index is 28.49 kg/m as calculated from the following:   Height as of this encounter: 5\' 4"  (1.626 m).   Weight as of this encounter: 166 lb (75.3 kg). Ideal: Ideal body weight: 54.7 kg (120 lb 9.5 oz) Adjusted ideal body weight: 62.9 kg (138 lb 12.1 oz)  Chronic low back pain  Assessment   Status Diagnosis  Controlled Controlled Controlled 1. Lumbar spondylosis   2. Lumbar facet arthropathy (L4/L5 and L5/S1)   3. Lumbar radiculopathy   4. Degeneration of intervertebral disc of lumbar region with discogenic back pain   5. Chronic pain syndrome      Plan of Care    Ms. SURYA BEINE has a current medication list which includes the following long-term medication(s): albuterol, albuterol, azelastine, calcium 600+d plus minerals, carvedilol, escitalopram, furosemide, gabapentin, levothyroxine, mirtazapine, and pantoprazole.  Pharmacotherapy (Medications Ordered): Meds ordered this encounter  Medications   HYDROcodone-acetaminophen (NORCO/VICODIN) 5-325 MG tablet    Sig: Take 1 tablet by mouth every 12 (twelve) hours as needed for severe pain (pain score 7-10). Must last 30 days.    Dispense:  45 tablet    Refill:  0    Chronic Pain: STOP Act (Not applicable) Fill 1 day early if closed on refill date. Avoid benzodiazepines within 8 hours of opioids   HYDROcodone-acetaminophen (NORCO/VICODIN) 5-325 MG tablet    Sig: Take 1 tablet by mouth every 12 (twelve) hours as needed for severe pain (pain score 7-10). Must last 30 days.    Dispense:  45 tablet    Refill:  0    Chronic Pain: STOP Act (Not applicable) Fill 1 day early if closed on refill date. Avoid  benzodiazepines within 8 hours of opioids   HYDROcodone-acetaminophen (NORCO/VICODIN) 5-325 MG tablet    Sig: Take 1 tablet by mouth every 12 (twelve) hours as needed for severe pain (pain score 7-10). Must last 30 days.    Dispense:  45 tablet    Refill:  0    Chronic Pain: STOP Act (Not applicable) Fill 1 day early if closed on refill date. Avoid benzodiazepines within 8 hours of opioids   Consider repeating lumbar facet medial branch nerve blocks that she had in 2019.  Orders Placed This Encounter  Procedures   ToxASSURE Select 13 (MW), Urine    Volume: 30 ml(s). Minimum 3 ml of urine is needed. Document temperature of fresh sample. Indications: Long term (current) use of opiate analgesic [phone])    Order Specific Question:   Release to patient    Answer:   Immediate     Follow-up plan:   Return in about 15 weeks (around 11/19/2023) for MM, F2F.     consider RFA in future, Sprint PNS of medial branch at L4          Recent Visits No visits were found meeting these conditions. Showing recent visits within past 90 days and meeting all other requirements Today's Visits Date Type Provider Dept  08/06/23 Office Visit Edward Jolly, MD [facility]  Showing today's visits and meeting all other requirements Future Appointments No visits were found meeting these conditions. Showing future appointments within next 90 days and meeting all other requirements  I discussed the assessment and treatment plan with the patient. The patient was provided an opportunity to ask questions and all were answered. The patient agreed with the plan and demonstrated an understanding of the instructions.  Patient advised to call back or seek an in-person evaluation if the symptoms or condition worsens.  Duration of encounter: .  Note by: Edward Jolly, MD Date: 08/06/2023; Time: 10:09 AM

## 2023-08-13 DIAGNOSIS — E559 Vitamin D deficiency, unspecified: Secondary | ICD-10-CM | POA: Diagnosis not present

## 2023-08-13 DIAGNOSIS — R7303 Prediabetes: Secondary | ICD-10-CM | POA: Diagnosis not present

## 2023-08-13 DIAGNOSIS — E538 Deficiency of other specified B group vitamins: Secondary | ICD-10-CM | POA: Diagnosis not present

## 2023-08-13 DIAGNOSIS — E782 Mixed hyperlipidemia: Secondary | ICD-10-CM | POA: Diagnosis not present

## 2023-08-13 DIAGNOSIS — N1831 Chronic kidney disease, stage 3a: Secondary | ICD-10-CM | POA: Diagnosis not present

## 2023-08-13 LAB — TOXASSURE SELECT 13 (MW), URINE

## 2023-08-18 DIAGNOSIS — E039 Hypothyroidism, unspecified: Secondary | ICD-10-CM | POA: Diagnosis not present

## 2023-08-18 DIAGNOSIS — F32A Depression, unspecified: Secondary | ICD-10-CM | POA: Diagnosis not present

## 2023-08-18 DIAGNOSIS — I739 Peripheral vascular disease, unspecified: Secondary | ICD-10-CM | POA: Diagnosis not present

## 2023-08-18 DIAGNOSIS — N2581 Secondary hyperparathyroidism of renal origin: Secondary | ICD-10-CM | POA: Diagnosis not present

## 2023-08-18 DIAGNOSIS — I771 Stricture of artery: Secondary | ICD-10-CM | POA: Diagnosis not present

## 2023-08-18 DIAGNOSIS — E785 Hyperlipidemia, unspecified: Secondary | ICD-10-CM | POA: Diagnosis not present

## 2023-08-18 DIAGNOSIS — I129 Hypertensive chronic kidney disease with stage 1 through stage 4 chronic kidney disease, or unspecified chronic kidney disease: Secondary | ICD-10-CM | POA: Diagnosis not present

## 2023-08-18 DIAGNOSIS — R7303 Prediabetes: Secondary | ICD-10-CM | POA: Diagnosis not present

## 2023-08-18 DIAGNOSIS — J4489 Other specified chronic obstructive pulmonary disease: Secondary | ICD-10-CM | POA: Diagnosis not present

## 2023-08-31 DIAGNOSIS — R399 Unspecified symptoms and signs involving the genitourinary system: Secondary | ICD-10-CM | POA: Diagnosis not present

## 2023-09-25 DIAGNOSIS — R399 Unspecified symptoms and signs involving the genitourinary system: Secondary | ICD-10-CM | POA: Diagnosis not present

## 2023-10-08 DIAGNOSIS — R809 Proteinuria, unspecified: Secondary | ICD-10-CM | POA: Diagnosis not present

## 2023-10-08 DIAGNOSIS — D631 Anemia in chronic kidney disease: Secondary | ICD-10-CM | POA: Diagnosis not present

## 2023-10-08 DIAGNOSIS — N1832 Chronic kidney disease, stage 3b: Secondary | ICD-10-CM | POA: Diagnosis not present

## 2023-10-08 DIAGNOSIS — R600 Localized edema: Secondary | ICD-10-CM | POA: Diagnosis not present

## 2023-11-17 ENCOUNTER — Encounter: Payer: Self-pay | Admitting: Student in an Organized Health Care Education/Training Program

## 2023-11-17 ENCOUNTER — Ambulatory Visit
Payer: Medicare HMO | Attending: Student in an Organized Health Care Education/Training Program | Admitting: Student in an Organized Health Care Education/Training Program

## 2023-11-17 VITALS — BP 195/80 | HR 66 | Temp 97.3°F | Ht 64.0 in | Wt 166.0 lb

## 2023-11-17 DIAGNOSIS — M5416 Radiculopathy, lumbar region: Secondary | ICD-10-CM

## 2023-11-17 DIAGNOSIS — M47816 Spondylosis without myelopathy or radiculopathy, lumbar region: Secondary | ICD-10-CM

## 2023-11-17 DIAGNOSIS — M5136 Other intervertebral disc degeneration, lumbar region with discogenic back pain only: Secondary | ICD-10-CM

## 2023-11-17 DIAGNOSIS — G894 Chronic pain syndrome: Secondary | ICD-10-CM | POA: Diagnosis not present

## 2023-11-17 MED ORDER — HYDROCODONE-ACETAMINOPHEN 5-325 MG PO TABS: 1.0000 | ORAL_TABLET | Freq: Two times a day (BID) | ORAL | 0 refills | Status: AC | PRN

## 2023-11-17 MED ORDER — HYDROCODONE-ACETAMINOPHEN 5-325 MG PO TABS: 1.0000 | ORAL_TABLET | Freq: Two times a day (BID) | ORAL | 0 refills | Status: DC | PRN

## 2023-11-17 NOTE — Progress Notes (Signed)
 PROVIDER NOTE: Information contained herein reflects review and annotations entered in association with encounter. Interpretation of such information and data should be left to medically-trained personnel. Information provided to patient can be located elsewhere in the medical record under "Patient Instructions". Document created using STT-dictation technology, any transcriptional errors that may result from process are unintentional.    Patient: Tamara Finley  Service Category: E/M  Provider: Edward Jolly, MD  DOB: 28-Apr-1941  DOS: 11/17/2023  Specialty: Interventional Pain Management  MRN: [id number]  Setting: Ambulatory outpatient  PCP: Lynnea Ferrier, MD  Type: Established Patient    Referring Provider: Lynnea Ferrier, MD  Location: Office  Delivery: Face-to-face     HPI  Ms. TOBI Finley, a 83 y.o. year old female, is here today because of her Lumbar spondylosis [M47.816]. [name] primary complain today is Back Pain (lower)  Last encounter: My last encounter with her was on 08/06/23  Pertinent problems: [name] has Low back pain radiating to both legs; Peripheral arterial disease (HCC); Right hip pain; Chronic SI joint pain; Lumbar facet arthropathy (L4/L5 and L5/S1); Controlled substance agreement signed; Lumbar spondylosis; and Lumbar radiculopathy on their pertinent problem list. Pain Assessment: Severity of Chronic pain is reported as a 8 /10. Location: Back Lower/radiates down right leg to right knee. Onset: More than a month ago. Quality: Constant. Timing: Constant. Modifying factor(s): meds. Vitals:  height is 5\' 4"  (1.626 m) and weight is 166 lb (75.3 kg). Her temperature is 97.3 F (36.3 C) (abnormal). Her blood pressure is 195/80 (abnormal) and her pulse is 66. Her oxygen saturation is 98%.   Reason for encounter: medication management.   Discussed the use of AI scribe software for clinical note transcription with the patient, who gave verbal consent to  proceed.  History of Present Illness   Tamara Finley is an 83 year old female who presents for pain management. She is accompanied by daughter.   She experiences chronic pain that is stable and well-managed with her current medication regimen. She typically takes one hydrocodone pill per day, sometimes two, depending on her pain levels. She is concerned about running out of medication, which has led her to occasionally take less than needed.  She has been using BC powders for pain relief, but her nephrologist advised against it due to further kidney damage. She currently takes hydrocodone, one in the morning and sometimes another later in the day, but not every day due to fear of running out. She also takes a lot of medication at night to help her sleep.  She has compromised kidney function, with only half of one kidney functioning. There is concern about further damage from [facility] powders.        Pharmacotherapy Assessment  Analgesic: Hydrocodone 5 mg two times daily prn, quantity 45/month    Monitoring: [place] PMP: PDMP reviewed during this encounter.       Pharmacotherapy: No side-effects or adverse reactions reported. Compliance: No problems identified. Effectiveness: Clinically acceptable.  Florina Ou, RN  11/17/2023  9:55 AM  Sign when Signing Visit Safety precautions to be maintained throughout the outpatient stay will include: orient to surroundings, keep bed in low position, maintain call bell within reach at all times, provide assistance with transfer out of bed and ambulation.   Nursing Pain Medication Assessment:  Safety precautions to be maintained throughout the outpatient stay will include: orient to surroundings, keep bed in low position, maintain call bell within reach at all  times, provide assistance with transfer out of bed and ambulation.  Medication Inspection Compliance: Pill count conducted under aseptic conditions, in front of the patient. Neither the pills nor the  bottle was removed from the patient's sight at any time. Once count was completed pills were immediately returned to the patient in their original bottle.  Medication: Hydrocodone/APAP Pill/Patch Count:  29 of 45 pills remain Pill/Patch Appearance: Markings consistent with prescribed medication Bottle Appearance: Standard pharmacy container. Clearly labeled. Filled Date: 3 / 3 / 2025 Last Medication intake:  Today     UDS:  Summary  Date Value Ref Range Status  08/06/2023 FINAL  Final    Comment:    ==================================================================== [employer] Select 13 (MW) ==================================================================== Test                             Result       Flag       Units  Drug Present and Declared for Prescription Verification   Hydrocodone                    1225         EXPECTED   ng/mg creat   Dihydrocodeine                 87           EXPECTED   ng/mg creat   Norhydrocodone                 726          EXPECTED   ng/mg creat    Sources of hydrocodone include scheduled prescription medications.    Dihydrocodeine and norhydrocodone are expected metabolites of    hydrocodone. Dihydrocodeine is also available as a scheduled    prescription medication.  ==================================================================== Test                      Result    Flag   Units      Ref Range   Creatinine              61               mg/dL      >=16 ==================================================================== Declared Medications:  The flagging and interpretation on this report are based on the  following declared medications.  Unexpected results may arise from  inaccuracies in the declared medications.   **Note: The testing scope of this panel includes these medications:   Hydrocodone (Norco)   **Note: The testing scope of this panel does not include the  following reported medications:   Acetaminophen (Tylenol)   Acetaminophen (Norco)  Albuterol (Ventolin HFA)  Atorvastatin (Lipitor)  Azelastine (Astelin)  Calcium  Escitalopram (Lexapro)  Fluticasone (Flonase)  Furosemide (Lasix)  Gabapentin  Hydralazine (Apresoline)  Levothyroxine (Synthroid)  Mirtazapine (Remeron)  Pantoprazole (Protonix)  Potassium (Klor-Con)  Vitamin B12 ==================================================================== For clinical consultation, please call [phone]. ====================================================================      ROS  Constitutional: Denies any fever or chills Gastrointestinal: No reported hemesis, hematochezia, vomiting, or acute GI distress Musculoskeletal:  low back pain Neurological: No reported episodes of acute onset apraxia, aphasia, dysarthria, agnosia, amnesia, paralysis, loss of coordination, or loss of consciousness  Medication Review  Calcium 600+D Plus Minerals, HYDROcodone-acetaminophen, acetaminophen, albuterol, atorvastatin, azelastine, carvedilol, cyanocobalamin, escitalopram, ezetimibe, fluticasone, furosemide, gabapentin, hydrALAZINE, levothyroxine, mirtazapine, pantoprazole, and potassium chloride SA  History Review  Allergy: Ms. Archuleta is  allergic to benadryl [diphenhydramine hcl (sleep)] and alprazolam. Drug: Ms. Mccleese  reports no history of drug use. Alcohol:  reports no history of alcohol use. Tobacco:  reports that she quit smoking about 10 years ago. Her smoking use included cigarettes. She started smoking about 60 years ago. She has a 100 pack-year smoking history. She has never used smokeless tobacco. Social: Ms. Moldovan  reports that she quit smoking about 10 years ago. Her smoking use included cigarettes. She started smoking about 60 years ago. She has a 100 pack-year smoking history. She has never used smokeless tobacco. She reports that she does not drink alcohol and does not use drugs. Medical:  has a past medical history of Abdominal aortic aneurysm  (AAA) (HCC) (2000's), Age related entropion, unspecified laterality, Anxiety, Anxiety and depression, Bilateral renal artery stenosis (HCC), Breast calcification, left, Cancer (HCC) (10/20/2016), Carotid artery thrombosis, bilateral, Carotid stenosis, Chronic bronchitis (HCC), Chronic insomnia, COPD (chronic obstructive pulmonary disease) (HCC), Coronary artery disease, CRI (chronic renal insufficiency), stage 3 (moderate) (HCC), Depression, Diabetes mellitus without complication (HCC), GERD (gastroesophageal reflux disease), Heart palpitations, History of AAA (abdominal aortic aneurysm) repair, History of colon polyps, History of TIA (transient ischemic attack), Hypercholesterolemia, Hypertension, Hypothyroidism, Migraine headache, Osteoarthritis, Osteoporosis, Peripheral arterial disease (HCC), Pulmonary scarring, Renal disorder, SOB (shortness of breath), Stenosis of left subclavian artery (HCC), Stroke (HCC), Thyroid disease, TIA (transient ischemic attack), and Type 2 diabetes mellitus with vascular disease (HCC). Surgical: Ms. Axon  has a past surgical history that includes Cardiac surgery; Abdominal aortic aneurysm repair; Cardiac catheterization (N/A, 12/05/2015); Cardiac catheterization (Left, 12/31/2015); Breast excisional biopsy (Left, 2010); Breast excisional biopsy (Left, 07/01/2006); Breast biopsy (Left, 09/03/2016); Breast biopsy (Left, 09/25/2016); Bronchoscopy; CAROTID PTA/STENT INTERVENTION; Colonoscopy; Eye surgery; Laminectomy (07/25/2013); Back surgery; Mediastinoscopy; Mastectomy; Thoracoscopy (12/16/2016); and Colonoscopy with propofol (N/A, 06/15/2017). Family: family history includes CVA in her mother; Cancer in her brother; Coronary artery disease in her father; Dementia in her father; Diabetes in her brother; Epilepsy in her mother; Hypertension in her father and mother; Ovarian cancer in her sister; Stroke in her father and mother.  Laboratory Chemistry Profile   Renal Lab Results   Component Value Date   BUN 17 12/18/2022   CREATININE 1.13 (H) 12/18/2022   GFRAA >60 01/01/2016   GFRNONAA 49 (L) 12/18/2022    Hepatic Lab Results  Component Value Date   AST 31 12/17/2022   ALT 14 12/17/2022   ALBUMIN 3.4 (L) 12/17/2022   ALKPHOS 48 12/17/2022    Electrolytes Lab Results  Component Value Date   NA 137 12/18/2022   K 4.4 12/18/2022   CL 107 12/18/2022   CALCIUM 7.4 (L) 12/18/2022    Bone No results found for: "VD25OH", "VD125OH2TOT", "ZO1096EA5", "WU9811BJ4", "25OHVITD1", "25OHVITD2", "25OHVITD3", "TESTOFREE", "TESTOSTERONE"  Inflammation (CRP: Acute Phase) (ESR: Chronic Phase) Lab Results  Component Value Date   ESRSEDRATE 23 10/12/2015   LATICACIDVEN 1.6 12/17/2022         Note: Above Lab results reviewed.  Recent Imaging Review  VAS US CAROTID Carotid Arterial Duplex Study  Patient Name:  Tamara Finley  Date of Exam:   02/06/2023 Medical Rec #: [id number]       Accession #:    [id number] Date of Birth: 1941-07-28      Patient Gender: F Patient Age:   39 years Exam Location:  [facility] Procedure:      VAS US CAROTID Referring Phys: Festus Barren  --------------------------------------------------------------------------------   Indications:  Carotid artery disease and Left stent. Other Factors:     04/17: Lt Renal Stent and Aorta Bi Iliac BPG.                      05/17: Carotid/Renal Stent. Comparison Study:  02/12/2022  Performing Technologist: Salvadore Farber RVT    Examination Guidelines: A complete evaluation includes B-mode imaging, spectral Doppler, color Doppler, and power Doppler as needed of all accessible portions of each vessel. Bilateral testing is considered an integral part of a complete examination. Limited examinations for reoccurring indications may be performed as noted.    Right Carotid Findings: +----------+--------+--------+--------+------------------+--------+           PSV cm/sEDV  cm/sStenosisPlaque DescriptionComments +----------+--------+--------+--------+------------------+--------+ CCA Prox  60      12                                         +----------+--------+--------+--------+------------------+--------+ CCA Mid   70      20                                         +----------+--------+--------+--------+------------------+--------+ CCA Distal86      18                                         +----------+--------+--------+--------+------------------+--------+ ICA Prox  89      21                                         +----------+--------+--------+--------+------------------+--------+ ICA Mid   195     47                                         +----------+--------+--------+--------+------------------+--------+ ICA Distal119     27                                         +----------+--------+--------+--------+------------------+--------+ ECA       89      9                                          +----------+--------+--------+--------+------------------+--------+  +----------+--------+-------+--------+-------------------+           PSV cm/sEDV cmsDescribeArm Pressure (mmHG) +----------+--------+-------+--------+-------------------+ [id number]                                        +----------+--------+-------+--------+-------------------+  +---------+--------+---+--------+--+ VertebralPSV cm/s108EDV cm/s25 +---------+--------+---+--------+--+     Left Carotid Findings: +----------+--------+--------+--------+------------------+--------+           PSV cm/sEDV cm/sStenosisPlaque DescriptionComments +----------+--------+--------+--------+------------------+--------+ CCA Prox  60      19                                         +----------+--------+--------+--------+------------------+--------+  CCA Mid   61      17                                          +----------+--------+--------+--------+------------------+--------+ CCA Distal74      19                                         +----------+--------+--------+--------+------------------+--------+ ICA Prox  82      20                                         +----------+--------+--------+--------+------------------+--------+ ICA Mid   92      26                                         +----------+--------+--------+--------+------------------+--------+ ICA Distal110     23                                         +----------+--------+--------+--------+------------------+--------+ ECA       143     9                                          +----------+--------+--------+--------+------------------+--------+  +----------+--------+--------+--------+-------------------+           PSV cm/sEDV cm/sDescribeArm Pressure (mmHG) +----------+--------+--------+--------+-------------------+ [id number]                                         +----------+--------+--------+--------+-------------------+  +---------+--------+--+--------+--+ VertebralPSV cm/s46EDV cm/s13 +---------+--------+--+--------+--+        Summary: Right Carotid: Velocities in the right ICA are consistent with a 40-59%                stenosis. Non-hemodynamically significant plaque <50% noted in                the CCA. The ECA appears <50% stenosed.  Left Carotid: Velocities in the left ICA are consistent with a 1-39% stenosis.               Non-hemodynamically significant plaque <50% noted in the CCA. The               ECA appears >50% stenosed. Patent ICA stent.  Vertebrals:  Bilateral vertebral arteries demonstrate antegrade flow. Subclavians: Normal flow hemodynamics were seen in bilateral subclavian              arteries.  *See table(s) above for measurements and observations.    Electronically signed by Festus Barren MD on 02/12/2023 at 8:42:16 AM.      Final    VAS US RENAL ARTERY DUPLEX ABDOMINAL VISCERAL  Patient Name:  JEILYN REZNIK  Date of Exam:   02/06/2023 Medical Rec #: [id number]       Accession #:    [id number] Date of Birth: 08/12/1941  Patient Gender: F Patient Age:   13 years Exam Location:  [facility] Procedure:      VAS US RENAL ARTERY DUPLEX Referring Phys: [id number] JASON S DEW  --------------------------------------------------------------------------------  Indications: Rt kidney atrophy.              RAS  Vascular Interventions: 04/17: Lt Renal Stent and Aorta Bi Iliac BPG.                           05/17: Carotid/Renal Stent.  Performing Technologist: Salvadore Farber RVT    Examination Guidelines: A complete evaluation includes B-mode imaging, spectral Doppler, color Doppler, and power Doppler as needed of all accessible portions of each vessel. Bilateral testing is considered an integral part of a complete examination. Limited examinations for reoccurring indications may be performed as noted.    Duplex Findings: +----------+--------+--------+------+--------+ MesentericPSV cm/sEDV cm/sPlaqueComments +----------+--------+--------+------+--------+ Aorta Mid    89                          +----------+--------+--------+------+--------+          +------------------+--------+--------+-------+ Right Renal ArteryPSV cm/sEDV cm/sComment +------------------+--------+--------+-------+ Proximal             81                   +------------------+--------+--------+-------+ Mid                 106                   +------------------+--------+--------+-------+ Distal               53                   +------------------+--------+--------+-------+  +-----------------+--------+--------+-------+ Left Renal ArteryPSV cm/sEDV cm/sComment +-----------------+--------+--------+-------+ Proximal            66                    +-----------------+--------+--------+-------+ Mid                 55                   +-----------------+--------+--------+-------+ Distal              60                   +-----------------+--------+--------+-------+  +------------+--------+--------+----+-----------+--------+--------+----+ Right KidneyPSV cm/sEDV cm/sRI  Left KidneyPSV cm/sEDV cm/sRI   +------------+--------+--------+----+-----------+--------+--------+----+ Upper Pole                      Upper Pole                      +------------+--------+--------+----+-----------+--------+--------+----+ Mid         38      1       0.        25      7       0.70 +------------+--------+--------+----+-----------+--------+--------+----+ Lower Pole                      Lower Pole                      +------------+--------+--------+----+-----------+--------+--------+----+ Hilar  Hilar                           +------------+--------+--------+----+-----------+--------+--------+----+  +------------------+----+------------------+-----+ Right Kidney          Left Kidney             +------------------+----+------------------+-----+ RAR                   RAR                     +------------------+----+------------------+-----+ RAR (manual)      1.19RAR (manual)      .74   +------------------+----+------------------+-----+ Cortex                Cortex                  +------------------+----+------------------+-----+ Cortex thickness      Corex thickness         +------------------+----+------------------+-----+ Kidney length (cm)5.89Kidney length (cm)10.29 +------------------+----+------------------+-----+    Summary: Renal:   Right: Abnormal size for the right kidney. Abnormal right Resistive        Index. Abnormal cortical thickness of right kidney. 1-59%        stenosis of the right renal artery. RRV flow  present. Left:  Normal size of left kidney. Normal left Resistive Index.        Normal cortical thickness of the left kidney. 1-59% stenosis        of the left renal artery. LRV flow present. Evidence of        patent LRA stent.   *See table(s) above for measurements and observations.   Diagnosing physician: Festus Barren MD   Electronically signed by Festus Barren MD on 02/12/2023 at 8:42:09 AM.       Final    Note: Reviewed        Physical Exam  General appearance: Well nourished, well developed, and well hydrated. In no apparent acute distress Mental status: Alert, oriented x 3 (person, place, & time)       Respiratory: No evidence of acute respiratory distress Eyes: PERLA Vitals: BP (!) 195/80 Comment: pt did not take BP med this am; will follow up with PCP; Dr Cherylann Ratel notified  Pulse 66   Temp (!) 97.3 F (36.3 C)   Ht 5\' 4"  (1.626 m)   Wt 166 lb (75.3 kg)   SpO2 98%   BMI 28.49 kg/m  BMI: Estimated body mass index is 28.49 kg/m as calculated from the following:   Height as of this encounter: 5\' 4"  (1.626 m).   Weight as of this encounter: 166 lb (75.3 kg). Ideal: Ideal body weight: 54.7 kg (120 lb 9.5 oz) Adjusted ideal body weight: 62.9 kg (138 lb 12.1 oz)  Chronic low back pain  Assessment   Status Diagnosis  Controlled Controlled Controlled 1. Lumbar spondylosis   2. Lumbar facet arthropathy (L4/L5 and L5/S1)   3. Lumbar radiculopathy   4. Degeneration of intervertebral disc of lumbar region with discogenic back pain   5. Chronic pain syndrome      Plan of Care  Assessment and Plan    Chronic Pain   Chronic pain is managed with hydrocodone, taking one to two pills daily based on pain levels. Concerns about BC powders due to potential nephrotoxicity, especially with compromised kidney function, are noted. Increasing hydrocodone to two pills daily aims to improve pain management and reduce BC powder reliance. Tylenol 500 mg twice  daily is recommended as a safer  alternative.  Compromised Kidney Function   Compromised kidney function, with only half of one kidney functioning, requires careful medication management to prevent further damage. BC powders are discouraged due to her nephrotoxic potential. Regular monitoring of kidney function is necessary.        Ms. TIAIRA ARAMBULA has a current medication list which includes the following long-term medication(s): albuterol, albuterol, azelastine, calcium 600+d plus minerals, carvedilol, escitalopram, ezetimibe, furosemide, gabapentin, levothyroxine, mirtazapine, and pantoprazole.  Pharmacotherapy (Medications Ordered): Meds ordered this encounter  Medications   HYDROcodone-acetaminophen (NORCO/VICODIN) 5-325 MG tablet    Sig: Take 1 tablet by mouth every 12 (twelve) hours as needed for severe pain (pain score 7-10). Must last 30 days    Dispense:  60 tablet    Refill:  0    Chronic Pain: STOP Act (Not applicable) Fill 1 day early if closed on refill date. Avoid benzodiazepines within 8 hours of opioids   HYDROcodone-acetaminophen (NORCO/VICODIN) 5-325 MG tablet    Sig: Take 1 tablet by mouth every 12 (twelve) hours as needed for severe pain (pain score 7-10). Must last 30 days    Dispense:  60 tablet    Refill:  0    Chronic Pain: STOP Act (Not applicable) Fill 1 day early if closed on refill date. Avoid benzodiazepines within 8 hours of opioids   HYDROcodone-acetaminophen (NORCO/VICODIN) 5-325 MG tablet    Sig: Take 1 tablet by mouth every 12 (twelve) hours as needed for severe pain (pain score 7-10). Must last 30 days    Dispense:  60 tablet    Refill:  0    Chronic Pain: STOP Act (Not applicable) Fill 1 day early if closed on refill date. Avoid benzodiazepines within 8 hours of opioids   Consider repeating lumbar facet medial branch nerve blocks that she had in 2019.  No orders of the defined types were placed in this encounter.    Follow-up plan:   Return in about 3 months (around  02/17/2024) for MM, F2F.     consider RFA in future, Sprint PNS of medial branch at L4          Recent Visits No visits were found meeting these conditions. Showing recent visits within past 90 days and meeting all other requirements Today's Visits Date Type Provider Dept  11/17/23 Office Visit Edward Jolly, MD [facility]  Showing today's visits and meeting all other requirements Future Appointments Date Type Provider Dept  02/09/24 Appointment Edward Jolly, MD [facility]  Showing future appointments within next 90 days and meeting all other requirements  I discussed the assessment and treatment plan with the patient. The patient was provided an opportunity to ask questions and all were answered. The patient agreed with the plan and demonstrated an understanding of the instructions.  Patient advised to call back or seek an in-person evaluation if the symptoms or condition worsens.  Duration of encounter: .  Note by: Edward Jolly, MD Date: 11/17/2023; Time: 10:34 AM

## 2023-11-17 NOTE — Patient Instructions (Signed)

## 2023-11-17 NOTE — Progress Notes (Signed)
 Safety precautions to be maintained throughout the outpatient stay will include: orient to surroundings, keep bed in low position, maintain call bell within reach at all times, provide assistance with transfer out of bed and ambulation.   Nursing Pain Medication Assessment:  Safety precautions to be maintained throughout the outpatient stay will include: orient to surroundings, keep bed in low position, maintain call bell within reach at all times, provide assistance with transfer out of bed and ambulation.  Medication Inspection Compliance: Pill count conducted under aseptic conditions, in front of the patient. Neither the pills nor the bottle was removed from the patient's sight at any time. Once count was completed pills were immediately returned to the patient in their original bottle.  Medication: Hydrocodone/APAP Pill/Patch Count:  29 of 45 pills remain Pill/Patch Appearance: Markings consistent with prescribed medication Bottle Appearance: Standard pharmacy container. Clearly labeled. Filled Date: 3 / 3 / 2025 Last Medication intake:  Today

## 2023-11-19 DIAGNOSIS — R7303 Prediabetes: Secondary | ICD-10-CM | POA: Diagnosis not present

## 2023-11-19 DIAGNOSIS — E538 Deficiency of other specified B group vitamins: Secondary | ICD-10-CM | POA: Diagnosis not present

## 2023-11-19 DIAGNOSIS — E782 Mixed hyperlipidemia: Secondary | ICD-10-CM | POA: Diagnosis not present

## 2023-11-19 DIAGNOSIS — N1832 Chronic kidney disease, stage 3b: Secondary | ICD-10-CM | POA: Diagnosis not present

## 2023-11-26 DIAGNOSIS — R7303 Prediabetes: Secondary | ICD-10-CM | POA: Diagnosis not present

## 2023-11-26 DIAGNOSIS — Z9889 Other specified postprocedural states: Secondary | ICD-10-CM | POA: Diagnosis not present

## 2023-11-26 DIAGNOSIS — N2581 Secondary hyperparathyroidism of renal origin: Secondary | ICD-10-CM | POA: Diagnosis not present

## 2023-11-26 DIAGNOSIS — I739 Peripheral vascular disease, unspecified: Secondary | ICD-10-CM | POA: Diagnosis not present

## 2023-11-26 DIAGNOSIS — Z853 Personal history of malignant neoplasm of breast: Secondary | ICD-10-CM | POA: Diagnosis not present

## 2023-11-26 DIAGNOSIS — J4489 Other specified chronic obstructive pulmonary disease: Secondary | ICD-10-CM | POA: Diagnosis not present

## 2023-11-26 DIAGNOSIS — I150 Renovascular hypertension: Secondary | ICD-10-CM | POA: Diagnosis not present

## 2023-11-26 DIAGNOSIS — E782 Mixed hyperlipidemia: Secondary | ICD-10-CM | POA: Diagnosis not present

## 2023-11-26 DIAGNOSIS — E039 Hypothyroidism, unspecified: Secondary | ICD-10-CM | POA: Diagnosis not present

## 2024-01-19 ENCOUNTER — Encounter (INDEPENDENT_AMBULATORY_CARE_PROVIDER_SITE_OTHER): Payer: Self-pay

## 2024-01-26 DIAGNOSIS — H524 Presbyopia: Secondary | ICD-10-CM | POA: Diagnosis not present

## 2024-02-02 ENCOUNTER — Encounter (INDEPENDENT_AMBULATORY_CARE_PROVIDER_SITE_OTHER): Payer: Self-pay | Admitting: Vascular Surgery

## 2024-02-02 ENCOUNTER — Ambulatory Visit (INDEPENDENT_AMBULATORY_CARE_PROVIDER_SITE_OTHER): Payer: Medicare HMO | Admitting: Vascular Surgery

## 2024-02-02 ENCOUNTER — Ambulatory Visit (INDEPENDENT_AMBULATORY_CARE_PROVIDER_SITE_OTHER): Payer: Medicare HMO

## 2024-02-02 VITALS — BP 179/84 | HR 75 | Resp 16 | Wt 179.6 lb

## 2024-02-02 DIAGNOSIS — E785 Hyperlipidemia, unspecified: Secondary | ICD-10-CM | POA: Diagnosis not present

## 2024-02-02 DIAGNOSIS — I701 Atherosclerosis of renal artery: Secondary | ICD-10-CM

## 2024-02-02 DIAGNOSIS — E1159 Type 2 diabetes mellitus with other circulatory complications: Secondary | ICD-10-CM

## 2024-02-02 DIAGNOSIS — N183 Chronic kidney disease, stage 3 unspecified: Secondary | ICD-10-CM

## 2024-02-02 DIAGNOSIS — I10 Essential (primary) hypertension: Secondary | ICD-10-CM

## 2024-02-02 DIAGNOSIS — I6523 Occlusion and stenosis of bilateral carotid arteries: Secondary | ICD-10-CM

## 2024-02-02 DIAGNOSIS — I714 Abdominal aortic aneurysm, without rupture, unspecified: Secondary | ICD-10-CM

## 2024-02-02 NOTE — Progress Notes (Signed)
 Subjective:    Patient ID: Tamara Finley, female    DOB: [birth year], 83 y.o.   MRN: [id number] Chief Complaint  Patient presents with   Follow-up    71yr carotid and renal     Patient returns today in follow up of multiple vascular issues.  She is doing well without any new complaints today.  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  Duplex today shows her left renal artery velocities to be relatively normal suggesting less than 60% disease.  Her left kidney length is stable.  Right kidney is atrophic. She is also followed for carotid disease.  No focal neurologic symptoms. Specifically, the patient denies amaurosis fugax, speech or swallowing difficulties, or arm or leg weakness or numbness. Carotid duplex today shows stable 40 to 59% right ICA stenosis and left ICA is widely patent with stent  without evidence of stenosis.      Review of Systems  Constitutional: Negative.   Respiratory: Negative.    Cardiovascular: Negative.   Gastrointestinal: Negative.   Endocrine: Negative.   Genitourinary: Negative.   Skin: Negative.   Neurological: Negative.   Psychiatric/Behavioral: Negative.    All other systems reviewed and are negative.      Objective:    Physical Exam Constitutional:      Appearance: Normal appearance. She is normal weight.  HENT:     Head: Normocephalic.  Eyes:     Pupils: Pupils are equal, round, and reactive to light.  Cardiovascular:     Rate and Rhythm: Normal rate and regular rhythm.     Pulses: Normal pulses.     Heart sounds: Normal heart sounds.  Pulmonary:     Effort: Pulmonary effort is normal.     Breath sounds: Normal breath sounds.  Abdominal:     General: Abdomen is flat. Bowel sounds are normal.     Palpations: Abdomen is soft.  Musculoskeletal:        General: Normal range of motion.     Cervical back: Normal range of motion.  Skin:    General: Skin is warm and dry.     Capillary Refill:  Capillary refill takes 2 to 3 seconds.  Neurological:     General: No focal deficit present.     Mental Status: She is alert and oriented to person, place, and time. Mental status is at baseline.  Psychiatric:        Mood and Affect: Mood normal.        Behavior: Behavior normal.        Thought Content: Thought content normal.        Judgment: Judgment normal.     BP (!) 179/84   Pulse 75   Resp 16   Wt 179 lb 9.6 oz (81.5 kg)   BMI 30.83 kg/m   Past Medical History:  Diagnosis Date   Abdominal aortic aneurysm (AAA) (HCC) 2000's   Age related entropion, unspecified laterality    Anxiety    Anxiety and depression    Bilateral renal artery stenosis (HCC)    Breast calcification, left    Cancer (HCC) 10/20/2016   breast cancer   Carotid artery thrombosis, bilateral    Carotid stenosis    Chronic bronchitis (HCC)    Chronic insomnia    COPD (chronic obstructive pulmonary disease) (HCC)    Coronary artery disease    CRI (chronic renal insufficiency), stage 3 (moderate) (HCC)    Depression  Diabetes mellitus without complication (HCC)    GERD (gastroesophageal reflux disease)    Heart palpitations    History of AAA (abdominal aortic aneurysm) repair    History of colon polyps    History of TIA (transient ischemic attack)    Hypercholesterolemia    Hypertension    Hypothyroidism    Migraine headache    Osteoarthritis    Osteoporosis    Peripheral arterial disease (HCC)    Pulmonary scarring    Renal disorder    SOB (shortness of breath)    Stenosis of left subclavian artery (HCC)    Stroke (HCC)    Thyroid  disease    TIA (transient ischemic attack)    Type 2 diabetes mellitus with vascular disease (HCC)     Social History   Socioeconomic History   Marital status: Widowed    Spouse name: Not on file   Number of children: Not on file   Years of education: Not on file   Highest education level: Not on file  Occupational History   Not on file  Tobacco Use    Smoking status: Former    Current packs/day: 0.00    Average packs/day: 2.0 packs/day for 50.0 years (100.0 ttl pk-yrs)    Types: Cigarettes    Start date: 06/02/1963    Quit date: 06/01/2013    Years since quitting: 10.6   Smokeless tobacco: Never  Vaping Use   Vaping status: Never Used  Substance and Sexual Activity   Alcohol  use: No    Alcohol /week: 0.0 standard drinks of alcohol    Drug use: No   Sexual activity: Not on file  Other Topics Concern   Not on file  Social History Narrative   Not on file   Social Drivers of Health   Financial Resource Strain: Low Risk  (08/18/2023)   Received from [facility]   Overall Financial Resource Strain (CARDIA)    Difficulty of Paying Living Expenses: Not hard at all  Food Insecurity: No Food Insecurity (08/18/2023)   Received from [facility]   Hunger Vital Sign    Worried About Running Out of Food in the Last Year: Never true    Ran Out of Food in the Last Year: Never true  Transportation Needs: No Transportation Needs (08/18/2023)   Received from [facility] - Transportation    In the past 12 months, has lack of transportation kept you from medical appointments or from getting medications?: No    Lack of Transportation (Non-Medical): No  Physical Activity: Not on file  Stress: Not on file  Social Connections: Not on file  Intimate Partner Violence: Not At Risk (12/18/2022)   Humiliation, Afraid, Rape, and Kick questionnaire    Fear of Current or Ex-Partner: No    Emotionally Abused: No    Physically Abused: No    Sexually Abused: No    Past Surgical History:  Procedure Laterality Date   ABDOMINAL AORTIC ANEURYSM REPAIR     BACK SURGERY     BREAST BIOPSY Left 09/03/2016   benign   BREAST BIOPSY Left 09/25/2016   path pending   BREAST EXCISIONAL BIOPSY Left 2010   BREAST EXCISIONAL BIOPSY Left 07/01/2006   BRONCHOSCOPY     CARDIAC SURGERY     CAROTID  PTA/STENT INTERVENTION     COLONOSCOPY     COLONOSCOPY WITH PROPOFOL  N/A 06/15/2017   Procedure: COLONOSCOPY WITH PROPOFOL ;  Surgeon: Deveron Fly, MD;  Location: [facility] ENDOSCOPY;  Service: Endoscopy;  Laterality: N/A;   EYE SURGERY     LAMINECTOMY  07/25/2013   MASTECTOMY     MEDIASTINOSCOPY     PERIPHERAL VASCULAR CATHETERIZATION N/A 12/05/2015   Procedure: Renal Angiography;  Surgeon: Jackquelyn Mass, MD;  Location: [facility] INVASIVE CV LAB;  Service: Cardiovascular;  Laterality: N/A;   PERIPHERAL VASCULAR CATHETERIZATION Left 12/31/2015   Procedure: Carotid PTA/Stent Intervention;  Surgeon: Celso College, MD;  Location: [facility] INVASIVE CV LAB;  Service: Cardiovascular;  Laterality: Left;   THORACOSCOPY  12/16/2016    Family History  Problem Relation Age of Onset   CVA Mother    Hypertension Mother    Stroke Mother    Epilepsy Mother    Dementia Father    Stroke Father    Coronary artery disease Father    Hypertension Father    Ovarian cancer Sister    Cancer Brother    Diabetes Brother     Allergies  Allergen Reactions   Benadryl [Diphenhydramine Hcl (Sleep)] Other (See Comments)    BAD DREAMS   Alprazolam Rash    "bad dreams"       Latest Ref Rng & Units 12/18/2022    4:49 AM 12/17/2022   10:11 PM 01/01/2016    5:13 AM  CBC  WBC 4.0 - 10.5 K/uL 14.9  11.3  5.5   Hemoglobin 12.0 - 15.0 g/dL 16.1  09.6  04.5   Hematocrit 36.0 - 46.0 % 30.6  35.7  33.9   Platelets 150 - 400 K/uL 128  142  241        CMP     Component Value Date/Time   NA 137 12/18/2022 0449   K 4.4 12/18/2022 0449   CL 107 12/18/2022 0449   CO2 23 12/18/2022 0449   GLUCOSE 95 12/18/2022 0449   BUN 17 12/18/2022 0449   BUN 23 (H) 10/18/2012 0815   CREATININE 1.13 (H) 12/18/2022 0449   CREATININE 1.15 10/18/2012 0815   CALCIUM  7.4 (L) 12/18/2022 0449   PROT 6.3 (L) 12/17/2022 2211   ALBUMIN 3.4 (L) 12/17/2022 2211   AST 31 12/17/2022 2211   ALT 14 12/17/2022 2211   ALKPHOS 48 12/17/2022  2211   BILITOT 0.9 12/17/2022 2211   GFRNONAA 49 (L) 12/18/2022 0449   GFRNONAA 48 (L) 10/18/2012 0815     No results found.     Assessment & Plan:   1. Carotid atherosclerosis, bilateral (Primary) Carotid duplex today shows stable 40 to 59% right ICA stenosis and 1 to 39% left ICA stenosis without significant progression from previous studies. No role for intervention. Continue current medical regimen. Recheck in 1 year.   2. Renal artery stenosis (HCC) Duplex today shows her left renal artery velocities to be relatively normal suggesting less than 60% disease.  Her left kidney length is stable.  Right kidney is atrophic.  Doing well.  We will continue to follow on an annual basis.   3. CRI (chronic renal insufficiency), stage 3 (moderate) (HCC) Recent GFR was 38  4. Benign essential hypertension Continue antihypertensive medications as already ordered, these medications have been reviewed and there are no changes at this time.  5. Abdominal aortic aneurysm (AAA) without rupture, unspecified part (HCC) Aortobiiliac graft appears patent by duplex at last check   6. Type 2 diabetes mellitus with vascular disease (HCC) blood glucose control important in reducing the progression of atherosclerotic disease. Also, involved in wound healing. On appropriate medications. Last HgA1C  was 6.1  7. Hyperlipidemia, unspecified hyperlipidemia type lipid control important in reducing the progression of atherosclerotic disease. Continue statin therapy    Current Outpatient Medications on File Prior to Visit  Medication Sig Dispense Refill   acetaminophen  (TYLENOL ) 500 MG tablet Take 1,000 mg by mouth every 8 (eight) hours as needed for moderate pain.      albuterol  (PROVENTIL  HFA;VENTOLIN  HFA) 108 (90 BASE) MCG/ACT inhaler Inhale 2 puffs into the lungs every 6 (six) hours as needed for wheezing or shortness of breath. Reported on 12/31/2015     atorvastatin  (LIPITOR) 40 MG tablet Take 40 mg by  mouth daily at 6 PM.      azelastine  (ASTELIN ) 0.1 % nasal spray Place into both nostrils 2 (two) times daily. Use in each nostril as directed     Calcium  Carbonate-Vit D-Min (CALCIUM  600+D PLUS MINERALS) 600-400 MG-UNIT TABS Take by mouth.     carvedilol  (COREG ) 6.25 MG tablet Take 6.25 mg by mouth 2 (two) times daily with a meal.     escitalopram (LEXAPRO) 5 MG tablet Take 5 mg by mouth daily.     ezetimibe (ZETIA) 10 MG tablet Take 10 mg by mouth daily.     fluticasone  (FLONASE ) 50 MCG/ACT nasal spray Place 1-2 sprays into both nostrils daily as needed for rhinitis.     furosemide  (LASIX ) 40 MG tablet Take 1 tablet by mouth 1 day or 1 dose.     gabapentin  (NEURONTIN ) 300 MG capsule Take 2 capsules (600 mg total) by mouth at bedtime. 90 capsule 2   hydrALAZINE  (APRESOLINE ) 50 MG tablet Take 50 mg by mouth 2 (two) times daily.     HYDROcodone -acetaminophen  (NORCO/VICODIN) 5-325 MG tablet Take 1 tablet by mouth every 12 (twelve) hours as needed for severe pain (pain score 7-10). Must last 30 days 60 tablet 0   levothyroxine  (SYNTHROID ) 88 MCG tablet Take 88 mcg by mouth daily before breakfast.     mirtazapine  (REMERON ) 15 MG tablet Take 15 mg by mouth at bedtime.  3   pantoprazole  (PROTONIX ) 40 MG tablet Take 40 mg by mouth daily.     potassium chloride  SA (K-DUR,KLOR-CON ) 20 MEQ tablet Take 20 mEq by mouth as directed.     vitamin B-12 (CYANOCOBALAMIN ) 1000 MCG tablet Take 1 tablet by mouth 1 day or 1 dose.     albuterol  (PROVENTIL ) (2.5 MG/3ML) 0.083% nebulizer solution Take 3 mLs (2.5 mg total) by nebulization every 4 (four) hours as needed for wheezing or shortness of breath. 75 mL 2   No current facility-administered medications on file prior to visit.    There are no Patient Instructions on file for this visit. No follow-ups on file.   Annamaria Barrette, NP

## 2024-02-09 ENCOUNTER — Ambulatory Visit: Attending: Nurse Practitioner | Admitting: Nurse Practitioner

## 2024-02-09 ENCOUNTER — Encounter: Admitting: Student in an Organized Health Care Education/Training Program

## 2024-02-09 DIAGNOSIS — M47816 Spondylosis without myelopathy or radiculopathy, lumbar region: Secondary | ICD-10-CM | POA: Diagnosis not present

## 2024-02-09 DIAGNOSIS — M5136 Other intervertebral disc degeneration, lumbar region with discogenic back pain only: Secondary | ICD-10-CM | POA: Diagnosis not present

## 2024-02-09 DIAGNOSIS — G894 Chronic pain syndrome: Secondary | ICD-10-CM | POA: Diagnosis not present

## 2024-02-09 DIAGNOSIS — Z79899 Other long term (current) drug therapy: Secondary | ICD-10-CM | POA: Diagnosis not present

## 2024-02-09 DIAGNOSIS — M542 Cervicalgia: Secondary | ICD-10-CM

## 2024-02-09 DIAGNOSIS — M5416 Radiculopathy, lumbar region: Secondary | ICD-10-CM

## 2024-02-09 MED ORDER — HYDROCODONE-ACETAMINOPHEN 5-325 MG PO TABS: 1.0000 | ORAL_TABLET | Freq: Two times a day (BID) | ORAL | 0 refills | Status: AC | PRN

## 2024-02-09 MED ORDER — HYDROCODONE-ACETAMINOPHEN 5-325 MG PO TABS: 1.0000 | ORAL_TABLET | Freq: Two times a day (BID) | ORAL | 0 refills | Status: DC | PRN

## 2024-02-09 NOTE — Progress Notes (Signed)
 PROVIDER NOTE: Interpretation of information contained herein should be left to medically-trained personnel. Specific patient instructions are provided elsewhere under "Patient Instructions" section of medical record. This document was created in part using AI and STT-dictation technology, any transcriptional errors that may result from this process are unintentional.  Patient: Tamara Finley  Service: E/M   PCP: Melchor Spoon, MD  DOB: [birth year]  DOS: 02/09/2024  Provider: Cherylin Corrigan, NP  MRN: [id number]  Delivery: Virtual Visit  Specialty: Interventional Pain Management  Type: Established Patient  Setting: Ambulatory outpatient facility  Specialty designation: 09  Referring Prov.: Melchor Spoon, MD  Location: Remote location       Virtual Encounter - Pain Management PROVIDER NOTE: Information contained herein reflects review and annotations entered in association with encounter. Interpretation of such information and data should be left to medically-trained personnel. Information provided to patient can be located elsewhere in the medical record under "Patient Instructions". Document created using STT-dictation technology, any transcriptional errors that may result from process are unintentional.    Contact & Pharmacy Preferred: [phone] Home: [phone] (home) Mobile: [phone] (mobile) E-mail: [email or web address]  [id number] [place], [place] - [address] [place] [address] Phone: [phone] Fax: [phone]  [id number] [place], [place] - [address] AT [facility] [address] [place] [address] [place] [address] Phone: [phone] Fax: [phone]   Pre-screening  Ms. Loudon offered "in-person" vs "virtual" encounter. She indicated preferring virtual for this encounter.   Reason COVID-19*  Social distancing based on CDC and AMA recommendations.   I contacted Tamara Finley on 02/09/2024 via telephone.       I clearly identified myself as Cherylin Corrigan, NP. I verified that I was speaking with the correct person using two identifiers (Name: CATE ORAVEC, and date of birth: 1941/04/06).  Consent I sought verbal advanced consent from Tamara Finley for virtual visit interactions. I informed Ms. Llorens of possible security and privacy concerns, risks, and limitations associated with providing "not-in-person" medical evaluation and management services. I also informed Ms. Schader of the availability of "in-person" appointments. Finally, I informed her that there would be a charge for the virtual visit and that she could be  personally, fully or partially, financially responsible for it. Ms. Depaoli expressed understanding and agreed to proceed.   Historic Elements   Tamara Finley is a 83 y.o. year old, female patient evaluated today after our last contact on Visit date not found. Tamara Finley  has a past medical history of Abdominal aortic aneurysm (AAA) (HCC) (2000's), Age related entropion, unspecified laterality, Anxiety, Anxiety and depression, Bilateral renal artery stenosis (HCC), Breast calcification, left, Cancer (HCC) (10/20/2016), Carotid artery thrombosis, bilateral, Carotid stenosis, Chronic bronchitis (HCC), Chronic insomnia, COPD (chronic obstructive pulmonary disease) (HCC), Coronary artery disease, CRI (chronic renal insufficiency), stage 3 (moderate) (HCC), Depression, Diabetes mellitus without complication (HCC), GERD (gastroesophageal reflux disease), Heart palpitations, History of AAA (abdominal aortic aneurysm) repair, History of colon polyps, History of TIA (transient ischemic attack), Hypercholesterolemia, Hypertension, Hypothyroidism, Migraine headache, Osteoarthritis, Osteoporosis, Peripheral arterial disease (HCC), Pulmonary scarring, Renal disorder, SOB (shortness of breath), Stenosis of left subclavian artery (HCC), Stroke (HCC), Thyroid  disease, TIA (transient ischemic attack), and  Type 2 diabetes mellitus with vascular disease (HCC). She also  has a past surgical history that includes Cardiac surgery; Abdominal aortic aneurysm repair; Cardiac catheterization (N/A, 12/05/2015); Cardiac catheterization (  Left, 12/31/2015); Breast excisional biopsy (Left, 2010); Breast excisional biopsy (Left, 07/01/2006); Breast biopsy (Left, 09/03/2016); Breast biopsy (Left, 09/25/2016); Bronchoscopy; CAROTID PTA/STENT INTERVENTION; Colonoscopy; Eye surgery; Laminectomy (07/25/2013); Back surgery; Mediastinoscopy; Mastectomy; Thoracoscopy (12/16/2016); and Colonoscopy with propofol  (N/A, 06/15/2017). Tamara Finley has a current medication list which includes the following prescription(s): acetaminophen , albuterol , albuterol , atorvastatin , azelastine , calcium  600+d plus minerals, carvedilol , escitalopram, ezetimibe, fluticasone , furosemide , gabapentin , hydralazine , hydrocodone -acetaminophen , levothyroxine , mirtazapine , pantoprazole , potassium chloride  sa, and cyanocobalamin . She  reports that she quit smoking about 10 years ago. Her smoking use included cigarettes. She started smoking about 60 years ago. She has a 100 pack-year smoking history. She has never used smokeless tobacco. She reports that she does not drink alcohol  and does not use drugs. Tamara Finley is allergic to benadryl [diphenhydramine hcl (sleep)] and alprazolam.  BMI: Estimated body mass index is 30.83 kg/m as calculated from the following:   Height as of 11/17/23: 5\' 4"  (1.626 m).   Weight as of 02/02/24: 179 lb 9.6 oz (81.5 kg). Last encounter: Visit date not found. Last procedure: Visit date not found.  HPI  Today, she is being contacted for medication management. No change in medical history since last visit.  Patient's pain is at baseline.  Patient continues multimodal pain regimen as prescribed.  States that it provides pain relief and improvement in functional status.  She was accompanied by a daughter during today's virtual  visit.  Patient reports a fall on 02/07/2024, landing on her left arm.  She denies hitting her head and did not lose consciousness.  Since the fall, she has been experiencing pain in her left arm and left knee.  She also reports visible bruising on both areas.  Therefore, she is opting for a virtual visit for medication management.  Pharmacotherapy Assessment Opioid Analgesic: Hydrocodone -acetaminophen  (Norco/Vicodin) 5-325 mg tablet every 12 hours as needed for pain. MME=10 Monitoring: [place] PMP: PDMP reviewed during this encounter.       Pharmacotherapy: No side-effects or adverse reactions reported. Compliance: No problems identified. Effectiveness: Clinically acceptable. Plan: Refer to "POC".  UDS:  Summary  Date Value Ref Range Status  08/06/2023 FINAL  Final    Comment:    ==================================================================== [employer] Select 13 (MW) ==================================================================== Test                             Result       Flag       Units  Drug Present and Declared for Prescription Verification   Hydrocodone                     1225         EXPECTED   ng/mg creat   Dihydrocodeine                 87           EXPECTED   ng/mg creat   Norhydrocodone                 726          EXPECTED   ng/mg creat    Sources of hydrocodone  include scheduled prescription medications.    Dihydrocodeine and norhydrocodone are expected metabolites of    hydrocodone . Dihydrocodeine is also available as a scheduled    prescription medication.  ==================================================================== Test                      Result  Flag   Units      Ref Range   Creatinine              61               mg/dL      >=16 ==================================================================== Declared Medications:  The flagging and interpretation on this report are based on the  following declared medications.  Unexpected results may  arise from  inaccuracies in the declared medications.   **Note: The testing scope of this panel includes these medications:   Hydrocodone  (Norco)   **Note: The testing scope of this panel does not include the  following reported medications:   Acetaminophen  (Tylenol )  Acetaminophen  (Norco)  Albuterol  (Ventolin  HFA)  Atorvastatin  (Lipitor)  Azelastine  (Astelin )  Calcium   Escitalopram (Lexapro)  Fluticasone  (Flonase )  Furosemide  (Lasix )  Gabapentin   Hydralazine  (Apresoline )  Levothyroxine  (Synthroid )  Mirtazapine  (Remeron )  Pantoprazole  (Protonix )  Potassium (Klor-Con )  Vitamin B12 ==================================================================== For clinical consultation, please call [phone]. ====================================================================    No results found for: "CBDTHCR", "D8THCCBX", "D9THCCBX"  Laboratory Chemistry Profile   Renal Lab Results  Component Value Date   BUN 17 12/18/2022   CREATININE 1.13 (H) 12/18/2022   GFRAA >60 01/01/2016   GFRNONAA 49 (L) 12/18/2022    Hepatic Lab Results  Component Value Date   AST 31 12/17/2022   ALT 14 12/17/2022   ALBUMIN 3.4 (L) 12/17/2022   ALKPHOS 48 12/17/2022    Electrolytes Lab Results  Component Value Date   NA 137 12/18/2022   K 4.4 12/18/2022   CL 107 12/18/2022   CALCIUM  7.4 (L) 12/18/2022    Bone No results found for: "VD25OH", "VD125OH2TOT", "WJ1914NW2", "NF6213YQ6", "25OHVITD1", "25OHVITD2", "25OHVITD3", "TESTOFREE", "TESTOSTERONE"  Inflammation (CRP: Acute Phase) (ESR: Chronic Phase) Lab Results  Component Value Date   ESRSEDRATE 23 10/12/2015   LATICACIDVEN 1.6 12/17/2022         Note: Above Lab results reviewed.  Imaging   VAS US  CAROTID Carotid Arterial Duplex Study  Patient Name:  RAYCHELLE HUDMAN  Date of Exam:   02/02/2024 Medical Rec #: [id number]       Accession #:    [id number] Date of Birth: 11-07-1940      Patient Gender: F Patient Age:   47  years Exam Location:  [facility] Procedure:      VAS US  CAROTID Referring Phys: Mikki Alexander  --------------------------------------------------------------------------------   Indications:       Carotid artery disease and Left stent. Other Factors:     04/17: Lt Renal Stent and Aorta Bi Iliac BPG.                      05/17: Carotid/Renal Stent. Comparison Study:  01/2023  Performing Technologist: Faustine Hoof RVT    Examination Guidelines: A complete evaluation includes B-mode imaging, spectral Doppler, color Doppler, and power Doppler as needed of all accessible portions of each vessel. Bilateral testing is considered an integral part of a complete examination. Limited examinations for reoccurring indications may be performed as noted.    Right Carotid Findings: +----------+--------+--------+--------+----------------------+--------+           PSV cm/sEDV cm/sStenosisPlaque Description    Comments +----------+--------+--------+--------+----------------------+--------+ CCA Prox  81      13                                             +----------+--------+--------+--------+----------------------+--------+  CCA Mid   69      16                                             +----------+--------+--------+--------+----------------------+--------+ CCA Distal80      25                                             +----------+--------+--------+--------+----------------------+--------+ ICA Prox  189     44      40-59%  calcific and irregular         +----------+--------+--------+--------+----------------------+--------+ ICA Mid   124     27                                             +----------+--------+--------+--------+----------------------+--------+ ICA Distal136     32                                             +----------+--------+--------+--------+----------------------+--------+ ECA       126     12                                              +----------+--------+--------+--------+----------------------+--------+  +----------+--------+-------+----------------+-------------------+           PSV cm/sEDV cmsDescribe        Arm Pressure (mmHG) +----------+--------+-------+----------------+-------------------+ [id number]            Multiphasic, WNL                    +----------+--------+-------+----------------+-------------------+  +---------+--------+--+--------+--+---------+ VertebralPSV cm/s81EDV cm/s21Antegrade +---------+--------+--+--------+--+---------+     Left Carotid Findings: +----------+--------+--------+--------+------------------+--------+           PSV cm/sEDV cm/sStenosisPlaque DescriptionComments +----------+--------+--------+--------+------------------+--------+ CCA Prox  60      20                                         +----------+--------+--------+--------+------------------+--------+ CCA Mid   66      18                                         +----------+--------+--------+--------+------------------+--------+ CCA Distal66      20                                stent    +----------+--------+--------+--------+------------------+--------+ ICA Prox  75      23                                stent    +----------+--------+--------+--------+------------------+--------+ ICA Mid   77      17                                         +----------+--------+--------+--------+------------------+--------+  ICA Distal87      21                                         +----------+--------+--------+--------+------------------+--------+ ECA       72      9                                          +----------+--------+--------+--------+------------------+--------+  +----------+--------+--------+----------------+-------------------+           PSV cm/sEDV cm/sDescribe        Arm Pressure  (mmHG) +----------+--------+--------+----------------+-------------------+ Subclavian122             Multiphasic, WNL                    +----------+--------+--------+----------------+-------------------+  +---------+--------+--+--------+--+---------+ VertebralPSV cm/s56EDV cm/s12Antegrade +---------+--------+--+--------+--+---------+        Summary: Right Carotid: Velocities in the right ICA are consistent with a 40-59%                stenosis. Non-hemodynamically significant plaque <50% noted in                the CCA. The ECA appears <50% stenosed.  Left Carotid: Widely patent ICA s/p stent with no evidence of stenosis.  Vertebrals:  Bilateral vertebral arteries demonstrate antegrade flow. Subclavians: Normal flow hemodynamics were seen in bilateral subclavian              arteries.  *See table(s) above for measurements and observations.    Electronically signed by Mikki Alexander MD on 02/04/2024 at 8:33:38 AM.      Final   VAS US  RENAL ARTERY DUPLEX ABDOMINAL VISCERAL  Patient Name:  SHAKIARA LUKIC  Date of Exam:   02/02/2024 Medical Rec #: [id number]       Accession #:    [id number] Date of Birth: 01-22-1941      Patient Gender: F Patient Age:   21 years Exam Location:  [facility] Procedure:      VAS US  RENAL ARTERY DUPLEX Referring Phys: [id number] Donald Frost DEW  --------------------------------------------------------------------------------  Indications: RAS, Rt atrophied kidney  Vascular Interventions: 04/17: Lt Renal Stent and Aorta Bi Iliac BPG.                           05/17: Lt ICA stent.  Comparison Study: 01/2023  Performing Technologist: Faustine Hoof RVT    Examination Guidelines: A complete evaluation includes B-mode imaging, spectral Doppler, color Doppler, and power Doppler as needed of all accessible portions of each vessel. Bilateral testing is considered an integral part of a complete examination. Limited examinations for  reoccurring indications may be performed as noted.    Duplex Findings: +----------+--------+--------+------+--------+ MesentericPSV cm/sEDV cm/sPlaqueComments +----------+--------+--------+------+--------+ Aorta Mid    92                          +----------+--------+--------+------+--------+          +------------------+--------+--------+--------+ Right Renal ArteryPSV cm/sEDV cm/sComment  +------------------+--------+--------+--------+ Proximal                          Not seen +------------------+--------+--------+--------+ Mid  74      16            +------------------+--------+--------+--------+ Distal               65                    +------------------+--------+--------+--------+  +-----------------+--------+--------+-------+ Left Renal ArteryPSV cm/sEDV cm/sComment +-----------------+--------+--------+-------+ Proximal            89                   +-----------------+--------+--------+-------+ Mid                114                   +-----------------+--------+--------+-------+ Distal              75                   +-----------------+--------+--------+-------+  +------------+--------+--------+----+-----------+--------+--------+---+ Right KidneyPSV cm/sEDV cm/sRI  Left KidneyPSV cm/sEDV cm/sRI  +------------+--------+--------+----+-----------+--------+--------+---+ Upper Pole                      Upper Pole                     +------------+--------+--------+----+-----------+--------+--------+---+ Mid         41      16      0.                            +------------+--------+--------+----+-----------+--------+--------+---+ Lower Pole                      Lower Pole                     +------------+--------+--------+----+-----------+--------+--------+---+ Hilar                           Hilar                           +------------+--------+--------+----+-----------+--------+--------+---+  +------------------+----+------------------+-----+ Right Kidney          Left Kidney             +------------------+----+------------------+-----+ RAR                   RAR                     +------------------+----+------------------+-----+ RAR (manual)      .80 RAR (manual)      1.24  +------------------+----+------------------+-----+ Cortex                Cortex                  +------------------+----+------------------+-----+ Cortex thickness      Corex thickness         +------------------+----+------------------+-----+ Kidney length (cm)4.50Kidney length (cm)10.78 +------------------+----+------------------+-----+    Summary: Renal:   Right: Abnormal size for the right kidney. Abnormal right Resistive        Index. Abnormal cortical thickness of right kidney. RRV flow        present. Minimal blood flow seen in the right atrophied        kidney. Measures smaller than the previous study of 01/2023. Left:  Normal size of left kidney. Normal left Resistive Index.  Normal cortical thickness of the left kidney. 1-59% stenosis        of the left renal artery. LRV flow present. Evidence of        patent RA stent.   *See table(s) above for measurements and observations.   Diagnosing physician: Mikki Alexander MD   Electronically signed by Mikki Alexander MD on 02/04/2024 at 8:33:04 AM.       Final    Assessment  There were no encounter diagnoses.  Plan of Care  Problem-specific:  We will continue on current medication regimen.  Prescribing drug monitoring (PDMP) reviewed; findings consistent with the use of prescribed medication and no evidence of narcotic misuse or abuse.  Schedule follow-up face-to-face in 90 days for medication management with Marthe Slain, NP.  Ms. DEVRI KREHER has a current medication list which includes the following long-term medication(s):  albuterol , albuterol , azelastine , calcium  600+d plus minerals, carvedilol , escitalopram, ezetimibe, furosemide , gabapentin , levothyroxine , mirtazapine , and pantoprazole .  Pharmacotherapy (Medications Ordered): Hydrocodone -acetaminophen  (Norco/Vicodin) 01/02/2024 mg tablet every 12 hours as needed for pain   Follow-up plan:   No follow-ups on file.          Recent Visits Date Type Provider Dept  11/17/23 Office Visit Cephus Collin, MD [facility]  Showing recent visits within past 90 days and meeting all other requirements Today's Visits Date Type Provider Dept  02/09/24 Appointment Myeisha Kruser K, NP [facility]  Showing today's visits and meeting all other requirements Future Appointments No visits were found meeting these conditions. Showing future appointments within next 90 days and meeting all other requirements  I discussed the assessment and treatment plan with the patient. The patient was provided an opportunity to ask questions and all were answered. The patient agreed with the plan and demonstrated an understanding of the instructions.  Patient advised to call back or seek an in-person evaluation if the symptoms or condition worsens.  Duration of encounter: 30 minutes.  Note by: Cherylin Corrigan, NP Date: 02/09/2024; Time: 8:00 AM

## 2024-02-12 ENCOUNTER — Ambulatory Visit (INDEPENDENT_AMBULATORY_CARE_PROVIDER_SITE_OTHER)

## 2024-02-12 ENCOUNTER — Ambulatory Visit
Admission: RE | Admit: 2024-02-12 | Discharge: 2024-02-12 | Disposition: A | Discharge status: Home / Self Care | Source: Ambulatory Visit | Attending: Emergency Medicine | Admitting: Emergency Medicine

## 2024-02-12 VITALS — BP 119/67 | HR 72 | Temp 98.4°F | Resp 18 | Ht 64.0 in | Wt 179.7 lb

## 2024-02-12 DIAGNOSIS — Z23 Encounter for immunization: Secondary | ICD-10-CM

## 2024-02-12 DIAGNOSIS — M79602 Pain in left arm: Secondary | ICD-10-CM | POA: Diagnosis not present

## 2024-02-12 DIAGNOSIS — S51812A Laceration without foreign body of left forearm, initial encounter: Secondary | ICD-10-CM

## 2024-02-12 DIAGNOSIS — W19XXXA Unspecified fall, initial encounter: Secondary | ICD-10-CM | POA: Diagnosis not present

## 2024-02-12 DIAGNOSIS — T07XXXA Unspecified multiple injuries, initial encounter: Secondary | ICD-10-CM | POA: Diagnosis not present

## 2024-02-12 DIAGNOSIS — M79622 Pain in left upper arm: Secondary | ICD-10-CM | POA: Diagnosis not present

## 2024-02-12 DIAGNOSIS — M19012 Primary osteoarthritis, left shoulder: Secondary | ICD-10-CM | POA: Diagnosis not present

## 2024-02-12 DIAGNOSIS — M79632 Pain in left forearm: Secondary | ICD-10-CM | POA: Diagnosis not present

## 2024-02-12 MED ORDER — MUPIROCIN 2 % EX OINT: 1.0000 | TOPICAL_OINTMENT | Freq: Two times a day (BID) | CUTANEOUS | 0 refills | Status: AC

## 2024-02-12 MED ORDER — TETANUS-DIPHTH-ACELL PERTUSSIS 5-2.5-18.5 LF-MCG/0.5 IM SUSY
0.5000 mL | PREFILLED_SYRINGE | Freq: Once | INTRAMUSCULAR | Status: AC
Start: 2024-02-12 — End: 2024-02-12
  Administered 2024-02-12: 0.5 mL via INTRAMUSCULAR

## 2024-02-12 NOTE — Discharge Instructions (Addendum)
 Wet read of xrays is negative. Check my chart for official read. Your tetanus was updated in office today. Keep left forearm skin tear clean and covered,use mupirocin as directed.  Follow up with PCP in 3 days for wound check,call Dr. Rodolfo Clan for follow up appt If you have new or worsening issues or concerns(mental status change, worst HA of life, worsening issues,etc) go to Er for further evaluation May take over the counter tylenol  as label directed for pain.

## 2024-02-12 NOTE — ED Provider Notes (Signed)
 [facility]    CSN: [id number] Arrival date & time: 02/12/24  0931      History   Chief Complaint Chief Complaint  Patient presents with   Abrasion   Fall    HPI Tamara Finley is a 83 y.o. female.   83 year old female, Tamara Finley, presents to urgent care for evaluation of left arm skin tear that occurred 5 days prior at home. Pt states she was at home and got feet tangled up in blanket and fell onto hardwood floor. Pt denies headstrike or LOC. Pt has caregiver with her who reports she has been with pt 3 days this week and pt is acting normal. Pt is eating well,voiding well, c/o soreness all over, decreased ROM   The history is provided by the patient. No language interpreter was used.    Past Medical History:  Diagnosis Date   Abdominal aortic aneurysm (AAA) (HCC) 2000's   Age related entropion, unspecified laterality    Anxiety    Anxiety and depression    Bilateral renal artery stenosis (HCC)    Breast calcification, left    Cancer (HCC) 10/20/2016   breast cancer   Carotid artery thrombosis, bilateral    Carotid stenosis    Chronic bronchitis (HCC)    Chronic insomnia    COPD (chronic obstructive pulmonary disease) (HCC)    Coronary artery disease    CRI (chronic renal insufficiency), stage 3 (moderate) (HCC)    Depression    Diabetes mellitus without complication (HCC)    GERD (gastroesophageal reflux disease)    Heart palpitations    History of AAA (abdominal aortic aneurysm) repair    History of colon polyps    History of TIA (transient ischemic attack)    Hypercholesterolemia    Hypertension    Hypothyroidism    Migraine headache    Osteoarthritis    Osteoporosis    Peripheral arterial disease (HCC)    Pulmonary scarring    Renal disorder    SOB (shortness of breath)    Stenosis of left subclavian artery (HCC)    Stroke (HCC)    Thyroid  disease    TIA (transient ischemic attack)    Type 2 diabetes mellitus with vascular disease  [facility])     Patient Active Problem List   Diagnosis Date Noted   Fall 02/12/2024   Skin tear of left forearm without complication 02/12/2024   Need for tetanus booster 02/12/2024   Left arm pain 02/12/2024   Multiple contusions 02/12/2024   Abdominal aortic aneurysm (AAA) (HCC) 02/06/2023   Acute colitis 12/18/2022   Acute lower UTI 12/18/2022   Generalized weakness 12/18/2022   History of breast cancer 12/18/2022   Piriformis syndrome of both sides 04/30/2021   Bilateral neck pain 02/12/2021   DDD (degenerative disc disease), cervical 02/12/2021   Chronic pain syndrome 02/12/2021   Hyperparathyroidism due to renal insufficiency (HCC) 12/22/2020   Swelling of limb 06/01/2020   Acute kidney failure (HCC) 08/04/2019   Benign essential hypertension 08/04/2019   Lumbar spondylosis 07/21/2019   Lumbar radiculopathy 07/21/2019   Colitis, nonspecific 06/03/2019   Hematochezia 06/03/2019   Age related entropion 04/15/2019   B12 deficiency 03/15/2019   Lumbar facet arthropathy (L4/L5 and L5/S1) 09/28/2018   Controlled substance agreement signed 09/28/2018   Impaired glucose tolerance 12/07/2017   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) 06/01/2017   Renal artery stenosis (HCC) 04/10/2017   Mediastinal adenopathy 11/25/2016   Lung nodule 11/07/2016  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) 10/20/2016   Dyslipidemia 10/09/2016   Migraine headache 10/09/2016   Osteoarthritis 10/09/2016   ERRONEOUS ENCOUNTER--DISREGARD 10/09/2016   Left breast mass 08/15/2016   Elevated blood sugar 07/08/2016   Adenomatous colon polyp 05/27/2016   SOB (shortness of breath) 04/07/2016   Heart palpitations 04/01/2016   High risk medication use 02/11/2016   Carotid stenosis 12/31/2015   Malignant hypertension 12/06/2015   Renovascular hypertension, 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/2015   Carotid atherosclerosis, bilateral 10/25/2015   History of TIA (transient ischemic attack)  10/25/2015   TIA (transient ischemic attack) 10/17/2015   Stenosis of left subclavian artery (HCC) 10/15/2015   Low back pain radiating to both legs 08/21/2015   Chronic SI joint pain 08/21/2015   Perennial allergic rhinitis 08/14/2015   Primary osteoarthritis of right hip 10/11/2014   COPD (chronic obstructive pulmonary disease) (HCC) 08/11/2014   CRI (chronic renal insufficiency), stage 3 (moderate) (HCC) 08/11/2014   Breast calcification, left 04/06/2014   Right hip pain 03/14/2014   Depression 02/08/2014   Chronic insomnia 02/08/2014   Osteoporosis, postmenopausal 02/08/2014   Peripheral arterial disease (HCC) 02/08/2014   Type 2 diabetes mellitus with vascular disease (HCC) 02/08/2014   Primary localized osteoarthrosis, pelvic region and thigh 11/09/2013   GERD (gastroesophageal reflux disease) 07/18/2013   History of AAA (abdominal aortic aneurysm) repair 07/18/2013   Essential hypertension 07/18/2013   Hypothyroidism 07/18/2013   Recurrent major depressive disorder, in full remission (HCC) 07/18/2013   Lumbar stenosis 06/01/2013    Past Surgical History:  Procedure Laterality Date   ABDOMINAL AORTIC ANEURYSM REPAIR     BACK SURGERY     BREAST BIOPSY Left 09/03/2016   benign   BREAST BIOPSY Left 09/25/2016   path pending   BREAST EXCISIONAL BIOPSY Left 2010   BREAST EXCISIONAL BIOPSY Left 07/01/2006   BRONCHOSCOPY     CARDIAC SURGERY     CAROTID PTA/STENT INTERVENTION     COLONOSCOPY     COLONOSCOPY WITH PROPOFOL  N/A 06/15/2017   Procedure: COLONOSCOPY WITH PROPOFOL ;  Surgeon: Deveron Fly, MD;  Location: [facility];  Service: Endoscopy;  Laterality: N/A;   EYE SURGERY     LAMINECTOMY  07/25/2013   MASTECTOMY     MEDIASTINOSCOPY     PERIPHERAL VASCULAR CATHETERIZATION N/A 12/05/2015   Procedure: Renal Angiography;  Surgeon: Jackquelyn Mass, MD;  Location: [facility] INVASIVE CV LAB;  Service: Cardiovascular;  Laterality: N/A;   PERIPHERAL VASCULAR  CATHETERIZATION Left 12/31/2015   Procedure: Carotid PTA/Stent Intervention;  Surgeon: Celso College, MD;  Location: [facility] INVASIVE CV LAB;  Service: Cardiovascular;  Laterality: Left;   THORACOSCOPY  12/16/2016    OB History   No obstetric history on file.      Home Medications    Prior to Admission medications   Medication Sig Start Date End Date Taking? Authorizing Provider  atorvastatin  (LIPITOR) 40 MG tablet Take 40 mg by mouth daily at 6 PM.  05/27/16  Yes [provider]  carvedilol  (COREG ) 6.25 MG tablet Take 6.25 mg by mouth 2 (two) times daily with a meal.   Yes [provider]  escitalopram (LEXAPRO) 5 MG tablet Take 5 mg by mouth daily.   Yes [provider]  ezetimibe (ZETIA) 10 MG tablet Take 10 mg by mouth daily.   Yes [provider]  gabapentin  (NEURONTIN ) 300 MG capsule Take 2 capsules (600 mg total) by mouth at bedtime. 10/30/22  Yes Cephus Collin, MD  hydrALAZINE  (APRESOLINE ) 50 MG tablet Take 50 mg by mouth 2 (two) times daily.   Yes [provider]  HYDROcodone -acetaminophen  (NORCO/VICODIN) 5-325 MG tablet Take 1 tablet by mouth every 12 (twelve) hours as needed for severe pain (pain score 7-10). Must last 30 days 02/25/24 03/26/24 Yes Patel, Seema K, NP  HYDROcodone -acetaminophen  (NORCO/VICODIN) 5-325 MG tablet Take 1 tablet by mouth every 12 (twelve) hours as needed for severe pain (pain score 7-10). Must last 30 days 03/26/24 04/25/24 Yes Patel, Seema K, NP  HYDROcodone -acetaminophen  (NORCO/VICODIN) 5-325 MG tablet Take 1 tablet by mouth every 12 (twelve) hours as needed for severe pain (pain score 7-10). Must last 30 days 04/25/24 05/25/24 Yes Patel, Seema K, NP  levothyroxine  (SYNTHROID ) 88 MCG tablet Take 88 mcg by mouth daily before breakfast. 04/30/21  Yes [provider]  mirtazapine  (REMERON ) 15 MG tablet Take 15 mg by mouth at bedtime. 01/09/18  Yes [provider]  mupirocin ointment (BACTROBAN) 2 % Apply 1  Application topically 2 (two) times daily for 7 days. Left forearm 02/12/24 02/19/24 Yes Eissa Buchberger, Eveleen Hinds, NP  pantoprazole  (PROTONIX ) 40 MG tablet Take 40 mg by mouth daily.   Yes [provider]  vitamin B-12 (CYANOCOBALAMIN ) 1000 MCG tablet Take 1 tablet by mouth 1 day or 1 dose.   Yes [provider]  acetaminophen  (TYLENOL ) 500 MG tablet Take 1,000 mg by mouth every 8 (eight) hours as needed for moderate pain.     [provider]  albuterol  (PROVENTIL  HFA;VENTOLIN  HFA) 108 (90 BASE) MCG/ACT inhaler Inhale 2 puffs into the lungs every 6 (six) hours as needed for wheezing or shortness of breath. Reported on 12/31/2015    [provider]  albuterol  (PROVENTIL ) (2.5 MG/3ML) 0.083% nebulizer solution Take 3 mLs (2.5 mg total) by nebulization every 4 (four) hours as needed for wheezing or shortness of breath. 12/21/22 12/21/23  Melvinia Stager, MD  azelastine  (ASTELIN ) 0.1 % nasal spray Place into both nostrils 2 (two) times daily. Use in each nostril as directed    [provider]  Calcium  Carbonate-Vit D-Min (CALCIUM  600+D PLUS MINERALS) 600-400 MG-UNIT TABS Take by mouth.    [provider]  fluticasone  (FLONASE ) 50 MCG/ACT nasal spray Place 1-2 sprays into both nostrils daily as needed for rhinitis.    [provider]  furosemide  (LASIX ) 40 MG tablet Take 1 tablet by mouth 1 day or 1 dose.    [provider]  potassium chloride  SA (K-DUR,KLOR-CON ) 20 MEQ tablet Take 20 mEq by mouth as directed.    [provider]    Family History Family History  Problem Relation Age of Onset   CVA Mother    Hypertension Mother    Stroke Mother    Epilepsy Mother    Dementia Father    Stroke Father    Coronary artery disease Father    Hypertension Father    Ovarian cancer Sister    Cancer Brother    Diabetes Brother     Social History Social History   Tobacco Use   Smoking status: Former    Current packs/day: 0.00     Average packs/day: 2.0 packs/day for 50.0 years (100.0 ttl pk-yrs)    Types: Cigarettes    Start date: 06/02/1963    Quit date: 06/01/2013    Years since quitting: 10.7   Smokeless tobacco: Never  Vaping Use   Vaping status: Never Used  Substance Use Topics   Alcohol  use: No    Alcohol /week: 0.0 standard  drinks of alcohol    Drug use: No     Allergies   Benadryl [diphenhydramine hcl (sleep)] and Alprazolam   Review of Systems Review of Systems  Musculoskeletal:  Positive for myalgias.  Skin:  Positive for wound.  All other systems reviewed and are negative.    Physical Exam Triage Vital Signs ED Triage Vitals  Encounter Vitals Group     BP 02/12/24 0949 119/67     Girls Systolic BP Percentile --      Girls Diastolic BP Percentile --      Boys Systolic BP Percentile --      Boys Diastolic BP Percentile --      Pulse Rate 02/12/24 0949 72     Resp 02/12/24 0949 18     Temp 02/12/24 0949 98.4 F (36.9 C)     Temp Source 02/12/24 0949 Oral     SpO2 02/12/24 0949 90 %     Weight 02/12/24 0946 179 lb 10.8 oz (81.5 kg)     Height 02/12/24 0946 5' 4 (1.626 m)     Head Circumference --      Peak Flow --      Pain Score 02/12/24 0946 8     Pain Loc --      Pain Education --      Exclude from Growth Chart --    No data found.  Updated Vital Signs BP 119/67 (BP Location: Left Arm)   Pulse 72   Temp 98.4 F (36.9 C) (Oral)   Resp 18   Ht 5' 4 (1.626 m)   Wt 179 lb 10.8 oz (81.5 kg)   SpO2 94%   BMI 30.84 kg/m   Visual Acuity Right Eye Distance:   Left Eye Distance:   Bilateral Distance:    Right Eye Near:   Left Eye Near:    Bilateral Near:     Physical Exam  Cardiovascular:     Rate and Rhythm: Normal rate.     Pulses:          Radial pulses are 2+ on the left side.  Pulmonary:     Effort: Pulmonary effort is normal.     Breath sounds: Examination of the right-lower field reveals decreased breath sounds. Examination of the left-lower field reveals  decreased breath sounds. Decreased breath sounds present.     Comments: No ecchymosis,crepitus noted  Musculoskeletal:       Arms:   Skin:    General: Skin is warm.     Capillary Refill: Capillary refill takes less than 2 seconds.     Findings: Bruising, signs of injury and wound present.     Comments: See photo   Neurological:     General: No focal deficit present.     Mental Status: She is alert and oriented to person, place, and time.     GCS: GCS eye subscore is 4. GCS verbal subscore is 5. GCS motor subscore is 6.   Psychiatric:        Attention and Perception: Attention normal.        Mood and Affect: Mood normal.        Speech: Speech normal.        Behavior: Behavior is cooperative.      UC Treatments / Results  Labs (all labs ordered are listed, but only abnormal results are displayed) Labs Reviewed - No data to display  EKG   Radiology No results found.  Procedures Procedures (including critical care time)  Medications Ordered in UC Medications  Tdap (BOOSTRIX) injection 0.5 mL (0.5 mLs Intramuscular Given 02/12/24 1018)    Initial Impression / Assessment and Plan / UC Course  I have reviewed the triage vital signs and the nursing notes.  Pertinent labs & imaging results that were available during my care of the patient were reviewed by me and considered in my medical decision making (see chart for details).  Clinical Course as of 02/12/24 1113  Fri Feb 12, 2024  1010 Left humerus/forearm ordered d/t fall 5 days prior, bruising and decreased ROM of LUE [JD]  1015 Daughter verified pt needs tetanus today as it is not utd, will update in office, telfa dressing applied to skin tear left arm  [JD]  1104 Wet read of left humerus/forearm,negative for acute findings. Discussed exam findings, xray results and plan of care with pt and caregiver, strict got to ER precautions given, both verbalized understanding to this provider.  [JD]    Clinical Course User  Index [JD] Kobi Mario, Eveleen Hinds, NP    Ddx: Fall, skin tear left forearm, contusions, left arm pain, need for tetanus booster, fracture Final Clinical Impressions(s) / UC Diagnoses   Final diagnoses:  Fall, initial encounter  Skin tear of left forearm without complication, initial encounter  Need for tetanus booster  Left arm pain  Multiple contusions     Discharge Instructions      Wet read of xrays is negative. Check my chart for official read. Your tetanus was updated in office today. Keep left forearm skin tear clean and covered,use mupirocin as directed.  Follow up with PCP in 3 days for wound check,call Dr. Rodolfo Clan for follow up appt If you have new or worsening issues or concerns(mental status change, worst HA of life, worsening issues,etc) go to Er for further evaluation May take over the counter tylenol  as label directed for pain.   ED Prescriptions     Medication Sig Dispense Auth. Provider   mupirocin ointment (BACTROBAN) 2 % Apply 1 Application topically 2 (two) times daily for 7 days. Left forearm 15 g Sang Blount, Eveleen Hinds, NP      PDMP not reviewed this encounter.   Peter Brands, NP 02/12/24 1736

## 2024-02-12 NOTE — ED Triage Notes (Signed)
 Pt states she fell at her home. She states she tripped over a blanket and fell on hardwood floor. Accident occurred about 5 days ago. She has an abrasion on her left forearm. She states she is sore all over. She hit her mouth on the floor but states she did not hit her head.

## 2024-02-15 DIAGNOSIS — S51812D Laceration without foreign body of left forearm, subsequent encounter: Secondary | ICD-10-CM | POA: Diagnosis not present

## 2024-02-15 DIAGNOSIS — J449 Chronic obstructive pulmonary disease, unspecified: Secondary | ICD-10-CM | POA: Diagnosis not present

## 2024-02-15 DIAGNOSIS — N1832 Chronic kidney disease, stage 3b: Secondary | ICD-10-CM | POA: Diagnosis not present

## 2024-02-15 DIAGNOSIS — Z87891 Personal history of nicotine dependence: Secondary | ICD-10-CM | POA: Diagnosis not present

## 2024-02-15 DIAGNOSIS — I129 Hypertensive chronic kidney disease with stage 1 through stage 4 chronic kidney disease, or unspecified chronic kidney disease: Secondary | ICD-10-CM | POA: Diagnosis not present

## 2024-02-15 DIAGNOSIS — I739 Peripheral vascular disease, unspecified: Secondary | ICD-10-CM | POA: Diagnosis not present

## 2024-02-16 ENCOUNTER — Ambulatory Visit (HOSPITAL_COMMUNITY): Payer: Self-pay

## 2024-02-22 DIAGNOSIS — R600 Localized edema: Secondary | ICD-10-CM | POA: Diagnosis not present

## 2024-02-22 DIAGNOSIS — R809 Proteinuria, unspecified: Secondary | ICD-10-CM | POA: Diagnosis not present

## 2024-02-22 DIAGNOSIS — N1832 Chronic kidney disease, stage 3b: Secondary | ICD-10-CM | POA: Diagnosis not present

## 2024-02-22 DIAGNOSIS — I701 Atherosclerosis of renal artery: Secondary | ICD-10-CM | POA: Diagnosis not present

## 2024-02-22 DIAGNOSIS — D631 Anemia in chronic kidney disease: Secondary | ICD-10-CM | POA: Diagnosis not present

## 2024-02-23 DIAGNOSIS — R809 Proteinuria, unspecified: Secondary | ICD-10-CM | POA: Diagnosis not present

## 2024-02-23 DIAGNOSIS — N1832 Chronic kidney disease, stage 3b: Secondary | ICD-10-CM | POA: Diagnosis not present

## 2024-02-23 DIAGNOSIS — R600 Localized edema: Secondary | ICD-10-CM | POA: Diagnosis not present

## 2024-05-09 ENCOUNTER — Ambulatory Visit: Attending: Nurse Practitioner | Admitting: Nurse Practitioner

## 2024-05-09 ENCOUNTER — Encounter: Payer: Self-pay | Admitting: Nurse Practitioner

## 2024-05-09 VITALS — BP 158/74 | HR 62 | Temp 97.2°F | Resp 18 | Ht 64.0 in | Wt 175.0 lb

## 2024-05-09 DIAGNOSIS — M5416 Radiculopathy, lumbar region: Secondary | ICD-10-CM | POA: Diagnosis not present

## 2024-05-09 DIAGNOSIS — G894 Chronic pain syndrome: Secondary | ICD-10-CM | POA: Insufficient documentation

## 2024-05-09 DIAGNOSIS — M47816 Spondylosis without myelopathy or radiculopathy, lumbar region: Secondary | ICD-10-CM | POA: Insufficient documentation

## 2024-05-09 DIAGNOSIS — Z79899 Other long term (current) drug therapy: Secondary | ICD-10-CM | POA: Insufficient documentation

## 2024-05-09 DIAGNOSIS — M5136 Other intervertebral disc degeneration, lumbar region with discogenic back pain only: Secondary | ICD-10-CM | POA: Diagnosis not present

## 2024-05-09 MED ORDER — HYDROCODONE-ACETAMINOPHEN 5-325 MG PO TABS: 1.0000 | ORAL_TABLET | Freq: Two times a day (BID) | ORAL | 0 refills | Status: AC | PRN

## 2024-05-09 MED ORDER — HYDROCODONE-ACETAMINOPHEN 5-325 MG PO TABS: 1.0000 | ORAL_TABLET | Freq: Two times a day (BID) | ORAL | 0 refills | Status: DC | PRN

## 2024-05-09 NOTE — Patient Instructions (Signed)
 Hydrocodone  prescription x3 sent to pharmacy.

## 2024-05-09 NOTE — Progress Notes (Signed)
 Nursing Pain Medication Assessment:  Safety precautions to be maintained throughout the outpatient stay will include: orient to surroundings, keep bed in low position, maintain call bell within reach at all times, provide assistance with transfer out of bed and ambulation.  Medication Inspection Compliance: Pill count conducted under aseptic conditions, in front of the patient. Neither the pills nor the bottle was removed from the patient's sight at any time. Once count was completed pills were immediately returned to the patient in their original bottle.  Medication: Hydrocodone /APAP Pill/Patch Count: 35 of 60 pills/patches remain Pill/Patch Appearance: Markings consistent with prescribed medication Bottle Appearance: Standard pharmacy container. Clearly labeled. Filled Date: 08 / 25 / 2025 Last Medication intake:  Today

## 2024-05-09 NOTE — Progress Notes (Signed)
 PROVIDER NOTE: Interpretation of information contained herein should be left to medically-trained personnel. Specific patient instructions are provided elsewhere under Patient Instructions section of medical record. This document was created in part using AI and STT-dictation technology, any transcriptional errors that may result from this process are unintentional.  Patient: Tamara Finley  Service: E/M   PCP: Fernande Ophelia JINNY DOUGLAS, MD  DOB: [birth year]  DOS: 05/09/2024  Provider: Emmy MARLA Blanch, NP  MRN: [id number]  Delivery: Face-to-face  Specialty: Interventional Pain Management  Type: Established Patient  Setting: Ambulatory outpatient facility  Specialty designation: 09  Referring Prov.: Fernande Ophelia JINNY DOUGLAS, MD  Location: Outpatient office facility       History of present illness (HPI) [name], a 83 y.o. year old female, is here today because of her Lumbar spondylosis [M47.816]. Tamara Finley today is Back Pain (Radiates into right leg)  Pertinent problems: Tamara Finley has Low back pain radiating to both legs; Peripheral arterial disease (HCC); Right hip pain; Chronic SI joint pain; Lumbar facet arthropathy (L4/L5 and L5/S1); Controlled substance agreement signed; Lumbar spondylosis; and Lumbar radiculopathy on their pertinent problem list.   Pain Assessment: Severity of Chronic pain is reported as a 6 /10. Location: Back Lower/Radiates into right leg. Onset: More than a month ago. Quality: Aching, Dull, Sharp. Timing: Intermittent. Modifying factor(s): Rest. Vitals:  height is 5' 4 (1.626 m) and weight is 175 lb (79.4 kg). Her temporal temperature is 97.2 F (36.2 C) (abnormal). Her blood pressure is 158/74 (abnormal) and her pulse is 62. Her respiration is 18.  BMI: Estimated body mass index is 30.04 kg/m as calculated from the following:   Height as of this encounter: 5' 4 (1.626 m).   Weight as of this encounter: 175 lb (79.4 kg).  Last encounter: 02/09/2024. Last  procedure: Visit date not found.  Reason for encounter: medication management. No change in medical history since last visit.  Patient's pain is at baseline.  Patient continues multimodal pain regimen as prescribed.  States that it provides pain relief and improvement in functional status.  The patient continues to experience low back pain radiating to the right leg; however, Norco provides adequate relief, helping to manage pain levels and maintain functional activities.  Pharmacotherapy Assessment   Opioid Analgesic: Hydrocodone -acetaminophen  (Norco/Vicodin) 5-325 mg tablet every 12 hours as needed for pain. MME=10 Monitoring: [place] PMP: PDMP reviewed during this encounter.       Pharmacotherapy: No side-effects or adverse reactions reported. Compliance: No problems identified. Effectiveness: Clinically acceptable.  Tamara Finley, [place]  05/09/2024 10:39 AM  Sign when Signing Visit Nursing Pain Medication Assessment:  Safety precautions to be maintained throughout the outpatient stay will include: orient to surroundings, keep bed in low position, maintain call bell within reach at all times, provide assistance with transfer out of bed and ambulation.  Medication Inspection Compliance: Pill count conducted under aseptic conditions, in front of the patient. Neither the pills nor the bottle was removed from the patient's sight at any time. Once count was completed pills were immediately returned to the patient in their original bottle.  Medication: Hydrocodone /APAP Pill/Patch Count: 35 of 60 pills/patches remain Pill/Patch Appearance: Markings consistent with prescribed medication Bottle Appearance: Standard pharmacy container. Clearly labeled. Filled Date: 08 / 25 / 2025 Last Medication intake:  Today    UDS:  Summary  Date Value Ref Range Status  08/06/2023 FINAL  Final    Comment:    ==================================================================== [employer] [facility]  (  MW) ==================================================================== Test                             Result       Flag       Units  Drug Present and Declared for Prescription Verification   Hydrocodone                     1225         EXPECTED   ng/mg creat   Dihydrocodeine                 87           EXPECTED   ng/mg creat   Norhydrocodone                 726          EXPECTED   ng/mg creat    Sources of hydrocodone  include scheduled prescription medications.    Dihydrocodeine and norhydrocodone are expected metabolites of    hydrocodone . Dihydrocodeine is also available as a scheduled    prescription medication.  ==================================================================== Test                      Result    Flag   Units      Ref Range   Creatinine              61               mg/dL      >=79 ==================================================================== Declared Medications:  The flagging and interpretation on this report are based on the  following declared medications.  Unexpected results may arise from  inaccuracies in the declared medications.   **Note: The testing scope of this panel includes these medications:   Hydrocodone  (Norco)   **Note: The testing scope of this panel does not include the  following reported medications:   Acetaminophen  (Tylenol )  Acetaminophen  (Norco)  Albuterol  (Ventolin  HFA)  Atorvastatin  (Lipitor)  Azelastine  (Astelin )  Calcium   Escitalopram (Lexapro)  Fluticasone  (Flonase )  Furosemide  (Lasix )  Gabapentin   Hydralazine  (Apresoline )  Levothyroxine  (Synthroid )  Mirtazapine  (Remeron )  Pantoprazole  (Protonix )  Potassium (Klor-Con )  Vitamin B12 ==================================================================== For clinical consultation, please call [phone]. ====================================================================     No results found for: CBDTHCR No results found for: D8THCCBX No results  found for: D9THCCBX  ROS  Constitutional: Denies any fever or chills Gastrointestinal: No reported hemesis, hematochezia, vomiting, or acute GI distress Musculoskeletal: Low back pain radiating down to the right leg Neurological: No reported episodes of acute onset apraxia, aphasia, dysarthria, agnosia, amnesia, paralysis, loss of coordination, or loss of consciousness  Medication Review  Calcium  600+D Plus Minerals, HYDROcodone -acetaminophen , acetaminophen , albuterol , atorvastatin , azelastine , carvedilol , cyanocobalamin , escitalopram, ezetimibe, fluticasone , furosemide , gabapentin , hydrALAZINE , levothyroxine , mirtazapine , pantoprazole , and potassium chloride  SA  History Review  Allergy: Tamara Finley is allergic to benadryl [diphenhydramine hcl (sleep)] and alprazolam. Drug: Tamara Finley  reports no history of drug use. Alcohol :  reports no history of alcohol  use. Tobacco:  reports that she quit smoking about 10 years ago. Her smoking use included cigarettes. She started smoking about 60 years ago. She has a 100 pack-year smoking history. She has never used smokeless tobacco. Social: Tamara Finley  reports that she quit smoking about 10 years ago. Her smoking use included cigarettes. She started smoking about 60 years ago. She has a 100 pack-year smoking history. She has never used smokeless tobacco. She reports  that she does not drink alcohol  and does not use drugs. Medical:  has a past medical history of Abdominal aortic aneurysm (AAA) (HCC) (2000's), Age related entropion, unspecified laterality, Anxiety, Anxiety and depression, Bilateral renal artery stenosis (HCC), Breast calcification, left, Cancer (HCC) (10/20/2016), Carotid artery thrombosis, bilateral, Carotid stenosis, Chronic bronchitis (HCC), Chronic insomnia, COPD (chronic obstructive pulmonary disease) (HCC), Coronary artery disease, CRI (chronic renal insufficiency), stage 3 (moderate) (HCC), Depression, Diabetes mellitus without  complication (HCC), GERD (gastroesophageal reflux disease), Heart palpitations, History of AAA (abdominal aortic aneurysm) repair, History of colon polyps, History of TIA (transient ischemic attack), Hypercholesterolemia, Hypertension, Hypothyroidism, Migraine headache, Osteoarthritis, Osteoporosis, Peripheral arterial disease (HCC), Pulmonary scarring, Renal disorder, SOB (shortness of breath), Stenosis of left subclavian artery (HCC), Stroke (HCC), Thyroid  disease, TIA (transient ischemic attack), and Type 2 diabetes mellitus with vascular disease (HCC). Surgical: Tamara Finley  has a past surgical history that includes Cardiac surgery; Abdominal aortic aneurysm repair; Cardiac catheterization (N/A, 12/05/2015); Cardiac catheterization (Left, 12/31/2015); Breast excisional biopsy (Left, 2010); Breast excisional biopsy (Left, 07/01/2006); Breast biopsy (Left, 09/03/2016); Breast biopsy (Left, 09/25/2016); Bronchoscopy; CAROTID PTA/STENT INTERVENTION; Colonoscopy; Eye surgery; Laminectomy (07/25/2013); Back surgery; Mediastinoscopy; Mastectomy; Thoracoscopy (12/16/2016); and Colonoscopy with propofol  (N/A, 06/15/2017). Family: family history includes CVA in her mother; Cancer in her brother; Coronary artery disease in her father; Dementia in her father; Diabetes in her brother; Epilepsy in her mother; Hypertension in her father and mother; Ovarian cancer in her sister; Stroke in her father and mother.  Laboratory Chemistry Profile   Renal Lab Results  Component Value Date   BUN 17 12/18/2022   CREATININE 1.13 (H) 12/18/2022   GFRAA >60 01/01/2016   GFRNONAA 49 (L) 12/18/2022    Hepatic Lab Results  Component Value Date   AST 31 12/17/2022   ALT 14 12/17/2022   ALBUMIN 3.4 (L) 12/17/2022   ALKPHOS 48 12/17/2022    Electrolytes Lab Results  Component Value Date   NA 137 12/18/2022   K 4.4 12/18/2022   CL 107 12/18/2022   CALCIUM  7.4 (L) 12/18/2022    Bone No results found for: VD25OH,  [id number], CI6874NY7, CI7874NY7, 25OHVITD1, 25OHVITD2, 25OHVITD3, TESTOFREE, TESTOSTERONE  Inflammation (CRP: Acute Phase) (ESR: Chronic Phase) Lab Results  Component Value Date   ESRSEDRATE 23 10/12/2015   LATICACIDVEN 1.6 12/17/2022         Note: Above Lab results reviewed.  Recent Imaging Review  DG Forearm Left CLINICAL DATA:  Pain after fall 5 days ago.  EXAM: LEFT HUMERUS - 2+ VIEW; LEFT FOREARM - 2 VIEW  COMPARISON:  None Available.  FINDINGS: Diffuse osseous demineralization. No acute osseous abnormality. Mild osteoarthritis of the acromioclavicular, glenohumeral, and ulnotrochlear joints. Mild-to-moderate radiocarpal joint space narrowing with apparent dorsal tilt of the lunate noted on the lateral view of the forearm, which can be seen in the setting of DISI (dorsal intercalated segment instability). Soft tissue swelling of the forearm. Foci of vascular calcifications. No radiopaque foreign body.  IMPRESSION: 1. No acute osseous abnormality. 2. Mild-to-moderate radiocarpal joint space narrowing with apparent dorsal tilt of the lunate noted on the lateral view of the forearm, which can be seen in the setting of DISI (dorsal intercalated segment instability). 3. Mild osteoarthritis of the acromioclavicular, glenohumeral, and ulnotrochlear joints.  Electronically Signed   By: Harrietta Sherry M.D.   On: 02/12/2024 11:16 DG Humerus Left CLINICAL DATA:  Pain after fall 5 days ago.  EXAM: LEFT HUMERUS - 2+ VIEW; LEFT FOREARM - 2 VIEW  COMPARISON:  None Available.  FINDINGS: Diffuse osseous demineralization. No acute osseous abnormality. Mild osteoarthritis of the acromioclavicular, glenohumeral, and ulnotrochlear joints. Mild-to-moderate radiocarpal joint space narrowing with apparent dorsal tilt of the lunate noted on the lateral view of the forearm, which can be seen in the setting of DISI (dorsal intercalated segment instability). Soft  tissue swelling of the forearm. Foci of vascular calcifications. No radiopaque foreign body.  IMPRESSION: 1. No acute osseous abnormality. 2. Mild-to-moderate radiocarpal joint space narrowing with apparent dorsal tilt of the lunate noted on the lateral view of the forearm, which can be seen in the setting of DISI (dorsal intercalated segment instability). 3. Mild osteoarthritis of the acromioclavicular, glenohumeral, and ulnotrochlear joints.  Electronically Signed   By: Harrietta Sherry M.D.   On: 02/12/2024 11:16 Note: Reviewed        Physical Exam  Vitals: BP (!) 158/74 (BP Location: Left Arm, Patient Position: Sitting)   Pulse 62   Temp (!) 97.2 F (36.2 C) (Temporal)   Resp 18   Ht 5' 4 (1.626 m)   Wt 175 lb (79.4 kg)   BMI 30.04 kg/m  BMI: Estimated body mass index is 30.04 kg/m as calculated from the following:   Height as of this encounter: 5' 4 (1.626 m).   Weight as of this encounter: 175 lb (79.4 kg). Ideal: Ideal body weight: 54.7 kg (120 lb 9.5 oz) Adjusted ideal body weight: 64.6 kg (142 lb 5.7 oz) General appearance: Well nourished, well developed, and well hydrated. In no apparent acute distress Mental status: Alert, oriented x 3 (person, place, & time)       Respiratory: No evidence of acute respiratory distress Eyes: PERLA   Assessment   Diagnosis Status  1. Lumbar spondylosis   2. Lumbar facet arthropathy (L4/L5 and L5/S1)   3. Lumbar radiculopathy   4. Degeneration of intervertebral disc of lumbar region with discogenic back pain   5. Chronic pain syndrome   6. Medication management    Controlled Controlled Controlled   Updated Problems: No problems updated.  Plan of Care  Problem-specific:  Assessment and Plan We will continue on current medication regimen.  Prescribing drug monitoring (PDMP) reviewed; findings consistent with the use of prescribed medication and no evidence of narcotic misuse or abuse.  Drug screen (UDS) up-to-date.   No other new issues or concerns reported at this visit. Schedule follow-up face-to-face in 90 days for medication management with Emmy Blanch, NP.    Tamara Finley has a current medication list which includes the following long-term medication(s): albuterol , albuterol , azelastine , calcium  600+d plus minerals, carvedilol , escitalopram, ezetimibe, furosemide , gabapentin , levothyroxine , mirtazapine , and pantoprazole .  Pharmacotherapy (Medications Ordered): Meds ordered this encounter  Medications   HYDROcodone -acetaminophen  (NORCO/VICODIN) 5-325 MG tablet    Sig: Take 1 tablet by mouth every 12 (twelve) hours as needed for severe pain (pain score 7-10). Must last 30 days    Dispense:  60 tablet    Refill:  0    Chronic Pain: STOP Act (Not applicable) Fill 1 day early if closed on refill date. Avoid benzodiazepines within 8 hours of opioids   HYDROcodone -acetaminophen  (NORCO/VICODIN) 5-325 MG tablet    Sig: Take 1 tablet by mouth every 12 (twelve) hours as needed for severe pain (pain score 7-10). Must last 30 days    Dispense:  60 tablet    Refill:  0    Chronic Pain: STOP Act (Not applicable) Fill 1 day early if closed on refill date. Avoid benzodiazepines within  8 hours of opioids   HYDROcodone -acetaminophen  (NORCO/VICODIN) 5-325 MG tablet    Sig: Take 1 tablet by mouth every 12 (twelve) hours as needed for severe pain (pain score 7-10). Must last 30 days    Dispense:  60 tablet    Refill:  0    Chronic Pain: STOP Act (Not applicable) Fill 1 day early if closed on refill date. Avoid benzodiazepines within 8 hours of opioids   Orders:  No orders of the defined types were placed in this encounter.       Return in about 3 months (around 08/08/2024) for (F2F), (MM), Emmy Blanch NP.    Recent Visits Date Type Provider Dept  02/09/24 Office Visit Azaela Caracci K, NP [facility]  Showing recent visits within past 90 days and meeting all other requirements Today's Visits Date  Type Provider Dept  05/09/24 Office Visit Keynan Heffern K, NP [facility]  Showing today's visits and meeting all other requirements Future Appointments No visits were found meeting these conditions. Showing future appointments within next 90 days and meeting all other requirements  I discussed the assessment and treatment plan with the patient. The patient was provided an opportunity to ask questions and all were answered. The patient agreed with the plan and demonstrated an understanding of the instructions.  Patient advised to call back or seek an in-person evaluation if the symptoms or condition worsens.  Duration of encounter: 30 minutes.  Total time on encounter, as per AMA guidelines included both the face-to-face and non-face-to-face time personally spent by the physician and/or other qualified health care professional(s) on the day of the encounter (includes time in activities that require the physician or other qualified health care professional and does not include time in activities normally performed by clinical staff). Physician's time may include the following activities when performed: Preparing to see the patient (e.g., pre-charting review of records, searching for previously ordered imaging, lab work, and nerve conduction tests) Review of prior analgesic pharmacotherapies. Reviewing PMP Interpreting ordered tests (e.g., lab work, imaging, nerve conduction tests) Performing post-procedure evaluations, including interpretation of diagnostic procedures Obtaining and/or reviewing separately obtained history Performing a medically appropriate examination and/or evaluation Counseling and educating the patient/family/caregiver Ordering medications, tests, or procedures Referring and communicating with other health care professionals (when not separately reported) Documenting clinical information in the electronic or other health record Independently interpreting results  (not separately reported) and communicating results to the patient/ family/caregiver Care coordination (not separately reported)  Note by: Dorianna Mckiver K Lillyahna Hemberger, NP (TTS and AI technology used. I apologize for any typographical errors that were not detected and corrected.) Date: 05/09/2024; Time: 10:59 AM

## 2024-05-10 ENCOUNTER — Encounter: Admitting: Nurse Practitioner

## 2024-05-25 DIAGNOSIS — R399 Unspecified symptoms and signs involving the genitourinary system: Secondary | ICD-10-CM | POA: Diagnosis not present

## 2024-06-01 DIAGNOSIS — N1831 Chronic kidney disease, stage 3a: Secondary | ICD-10-CM | POA: Diagnosis not present

## 2024-06-01 DIAGNOSIS — E039 Hypothyroidism, unspecified: Secondary | ICD-10-CM | POA: Diagnosis not present

## 2024-06-01 DIAGNOSIS — E538 Deficiency of other specified B group vitamins: Secondary | ICD-10-CM | POA: Diagnosis not present

## 2024-06-01 DIAGNOSIS — E782 Mixed hyperlipidemia: Secondary | ICD-10-CM | POA: Diagnosis not present

## 2024-06-01 DIAGNOSIS — R7303 Prediabetes: Secondary | ICD-10-CM | POA: Diagnosis not present

## 2024-06-08 DIAGNOSIS — M549 Dorsalgia, unspecified: Secondary | ICD-10-CM | POA: Diagnosis not present

## 2024-06-08 DIAGNOSIS — G319 Degenerative disease of nervous system, unspecified: Secondary | ICD-10-CM | POA: Diagnosis not present

## 2024-06-08 DIAGNOSIS — N390 Urinary tract infection, site not specified: Secondary | ICD-10-CM | POA: Diagnosis not present

## 2024-06-08 DIAGNOSIS — W010XXA Fall on same level from slipping, tripping and stumbling without subsequent striking against object, initial encounter: Secondary | ICD-10-CM | POA: Diagnosis not present

## 2024-06-08 DIAGNOSIS — Z79899 Other long term (current) drug therapy: Secondary | ICD-10-CM | POA: Diagnosis not present

## 2024-06-08 DIAGNOSIS — Z9889 Other specified postprocedural states: Secondary | ICD-10-CM | POA: Diagnosis not present

## 2024-06-08 DIAGNOSIS — J4489 Other specified chronic obstructive pulmonary disease: Secondary | ICD-10-CM | POA: Diagnosis not present

## 2024-06-08 DIAGNOSIS — I150 Renovascular hypertension: Secondary | ICD-10-CM | POA: Diagnosis not present

## 2024-06-08 DIAGNOSIS — N1831 Chronic kidney disease, stage 3a: Secondary | ICD-10-CM | POA: Diagnosis not present

## 2024-06-08 DIAGNOSIS — R399 Unspecified symptoms and signs involving the genitourinary system: Secondary | ICD-10-CM | POA: Diagnosis not present

## 2024-06-08 DIAGNOSIS — F418 Other specified anxiety disorders: Secondary | ICD-10-CM | POA: Diagnosis not present

## 2024-06-11 DIAGNOSIS — J449 Chronic obstructive pulmonary disease, unspecified: Secondary | ICD-10-CM | POA: Diagnosis not present

## 2024-06-11 DIAGNOSIS — I701 Atherosclerosis of renal artery: Secondary | ICD-10-CM | POA: Diagnosis not present

## 2024-06-11 DIAGNOSIS — E1151 Type 2 diabetes mellitus with diabetic peripheral angiopathy without gangrene: Secondary | ICD-10-CM | POA: Diagnosis not present

## 2024-06-11 DIAGNOSIS — I150 Renovascular hypertension: Secondary | ICD-10-CM | POA: Diagnosis not present

## 2024-06-11 DIAGNOSIS — E538 Deficiency of other specified B group vitamins: Secondary | ICD-10-CM | POA: Diagnosis not present

## 2024-06-11 DIAGNOSIS — M48061 Spinal stenosis, lumbar region without neurogenic claudication: Secondary | ICD-10-CM | POA: Diagnosis not present

## 2024-06-11 DIAGNOSIS — N2581 Secondary hyperparathyroidism of renal origin: Secondary | ICD-10-CM | POA: Diagnosis not present

## 2024-06-11 DIAGNOSIS — E1122 Type 2 diabetes mellitus with diabetic chronic kidney disease: Secondary | ICD-10-CM | POA: Diagnosis not present

## 2024-06-11 DIAGNOSIS — N1831 Chronic kidney disease, stage 3a: Secondary | ICD-10-CM | POA: Diagnosis not present

## 2024-06-15 DIAGNOSIS — J449 Chronic obstructive pulmonary disease, unspecified: Secondary | ICD-10-CM | POA: Diagnosis not present

## 2024-06-21 DIAGNOSIS — E1122 Type 2 diabetes mellitus with diabetic chronic kidney disease: Secondary | ICD-10-CM | POA: Diagnosis not present

## 2024-06-21 DIAGNOSIS — J449 Chronic obstructive pulmonary disease, unspecified: Secondary | ICD-10-CM | POA: Diagnosis not present

## 2024-06-21 DIAGNOSIS — I701 Atherosclerosis of renal artery: Secondary | ICD-10-CM | POA: Diagnosis not present

## 2024-06-21 DIAGNOSIS — M48061 Spinal stenosis, lumbar region without neurogenic claudication: Secondary | ICD-10-CM | POA: Diagnosis not present

## 2024-06-21 DIAGNOSIS — E1151 Type 2 diabetes mellitus with diabetic peripheral angiopathy without gangrene: Secondary | ICD-10-CM | POA: Diagnosis not present

## 2024-06-21 DIAGNOSIS — N1831 Chronic kidney disease, stage 3a: Secondary | ICD-10-CM | POA: Diagnosis not present

## 2024-06-21 DIAGNOSIS — I150 Renovascular hypertension: Secondary | ICD-10-CM | POA: Diagnosis not present

## 2024-06-22 DIAGNOSIS — I150 Renovascular hypertension: Secondary | ICD-10-CM | POA: Diagnosis not present

## 2024-06-22 DIAGNOSIS — E1122 Type 2 diabetes mellitus with diabetic chronic kidney disease: Secondary | ICD-10-CM | POA: Diagnosis not present

## 2024-06-22 DIAGNOSIS — N1831 Chronic kidney disease, stage 3a: Secondary | ICD-10-CM | POA: Diagnosis not present

## 2024-06-22 DIAGNOSIS — I701 Atherosclerosis of renal artery: Secondary | ICD-10-CM | POA: Diagnosis not present

## 2024-06-22 DIAGNOSIS — M48061 Spinal stenosis, lumbar region without neurogenic claudication: Secondary | ICD-10-CM | POA: Diagnosis not present

## 2024-06-22 DIAGNOSIS — E1151 Type 2 diabetes mellitus with diabetic peripheral angiopathy without gangrene: Secondary | ICD-10-CM | POA: Diagnosis not present

## 2024-06-22 DIAGNOSIS — N2581 Secondary hyperparathyroidism of renal origin: Secondary | ICD-10-CM | POA: Diagnosis not present

## 2024-06-22 DIAGNOSIS — E538 Deficiency of other specified B group vitamins: Secondary | ICD-10-CM | POA: Diagnosis not present

## 2024-06-22 DIAGNOSIS — J449 Chronic obstructive pulmonary disease, unspecified: Secondary | ICD-10-CM | POA: Diagnosis not present

## 2024-06-24 DIAGNOSIS — N1831 Chronic kidney disease, stage 3a: Secondary | ICD-10-CM | POA: Diagnosis not present

## 2024-07-01 DIAGNOSIS — E1122 Type 2 diabetes mellitus with diabetic chronic kidney disease: Secondary | ICD-10-CM | POA: Diagnosis not present

## 2024-07-08 DIAGNOSIS — N1831 Chronic kidney disease, stage 3a: Secondary | ICD-10-CM | POA: Diagnosis not present

## 2024-07-11 DIAGNOSIS — I150 Renovascular hypertension: Secondary | ICD-10-CM | POA: Diagnosis not present

## 2024-07-11 DIAGNOSIS — M810 Age-related osteoporosis without current pathological fracture: Secondary | ICD-10-CM | POA: Diagnosis not present

## 2024-07-11 DIAGNOSIS — J449 Chronic obstructive pulmonary disease, unspecified: Secondary | ICD-10-CM | POA: Diagnosis not present

## 2024-07-11 DIAGNOSIS — E785 Hyperlipidemia, unspecified: Secondary | ICD-10-CM | POA: Diagnosis not present

## 2024-07-11 DIAGNOSIS — Z9889 Other specified postprocedural states: Secondary | ICD-10-CM | POA: Diagnosis not present

## 2024-07-11 DIAGNOSIS — E1151 Type 2 diabetes mellitus with diabetic peripheral angiopathy without gangrene: Secondary | ICD-10-CM | POA: Diagnosis not present

## 2024-07-11 DIAGNOSIS — J4489 Other specified chronic obstructive pulmonary disease: Secondary | ICD-10-CM | POA: Diagnosis not present

## 2024-07-11 DIAGNOSIS — E1122 Type 2 diabetes mellitus with diabetic chronic kidney disease: Secondary | ICD-10-CM | POA: Diagnosis not present

## 2024-07-11 DIAGNOSIS — R7303 Prediabetes: Secondary | ICD-10-CM | POA: Diagnosis not present

## 2024-07-11 DIAGNOSIS — N2581 Secondary hyperparathyroidism of renal origin: Secondary | ICD-10-CM | POA: Diagnosis not present

## 2024-07-11 DIAGNOSIS — M48061 Spinal stenosis, lumbar region without neurogenic claudication: Secondary | ICD-10-CM | POA: Diagnosis not present

## 2024-07-11 DIAGNOSIS — I701 Atherosclerosis of renal artery: Secondary | ICD-10-CM | POA: Diagnosis not present

## 2024-07-11 DIAGNOSIS — N1831 Chronic kidney disease, stage 3a: Secondary | ICD-10-CM | POA: Diagnosis not present

## 2024-07-11 DIAGNOSIS — Z0000 Encounter for general adult medical examination without abnormal findings: Secondary | ICD-10-CM | POA: Diagnosis not present

## 2024-07-11 DIAGNOSIS — E039 Hypothyroidism, unspecified: Secondary | ICD-10-CM | POA: Diagnosis not present

## 2024-07-11 DIAGNOSIS — Z23 Encounter for immunization: Secondary | ICD-10-CM | POA: Diagnosis not present

## 2024-07-11 DIAGNOSIS — E538 Deficiency of other specified B group vitamins: Secondary | ICD-10-CM | POA: Diagnosis not present

## 2024-07-13 DIAGNOSIS — N1832 Chronic kidney disease, stage 3b: Secondary | ICD-10-CM | POA: Diagnosis not present

## 2024-07-13 DIAGNOSIS — I701 Atherosclerosis of renal artery: Secondary | ICD-10-CM | POA: Diagnosis not present

## 2024-07-13 DIAGNOSIS — R809 Proteinuria, unspecified: Secondary | ICD-10-CM | POA: Diagnosis not present

## 2024-07-13 DIAGNOSIS — D631 Anemia in chronic kidney disease: Secondary | ICD-10-CM | POA: Diagnosis not present

## 2024-07-13 DIAGNOSIS — R600 Localized edema: Secondary | ICD-10-CM | POA: Diagnosis not present

## 2024-07-19 DIAGNOSIS — N1831 Chronic kidney disease, stage 3a: Secondary | ICD-10-CM | POA: Diagnosis not present

## 2024-07-19 DIAGNOSIS — M48061 Spinal stenosis, lumbar region without neurogenic claudication: Secondary | ICD-10-CM | POA: Diagnosis not present

## 2024-07-19 DIAGNOSIS — I150 Renovascular hypertension: Secondary | ICD-10-CM | POA: Diagnosis not present

## 2024-07-19 DIAGNOSIS — N2581 Secondary hyperparathyroidism of renal origin: Secondary | ICD-10-CM | POA: Diagnosis not present

## 2024-07-19 DIAGNOSIS — E538 Deficiency of other specified B group vitamins: Secondary | ICD-10-CM | POA: Diagnosis not present

## 2024-07-19 DIAGNOSIS — E1122 Type 2 diabetes mellitus with diabetic chronic kidney disease: Secondary | ICD-10-CM | POA: Diagnosis not present

## 2024-07-19 DIAGNOSIS — I701 Atherosclerosis of renal artery: Secondary | ICD-10-CM | POA: Diagnosis not present

## 2024-07-19 DIAGNOSIS — E1151 Type 2 diabetes mellitus with diabetic peripheral angiopathy without gangrene: Secondary | ICD-10-CM | POA: Diagnosis not present

## 2024-07-19 DIAGNOSIS — J449 Chronic obstructive pulmonary disease, unspecified: Secondary | ICD-10-CM | POA: Diagnosis not present

## 2024-07-26 DIAGNOSIS — I150 Renovascular hypertension: Secondary | ICD-10-CM | POA: Diagnosis not present

## 2024-07-26 DIAGNOSIS — E1151 Type 2 diabetes mellitus with diabetic peripheral angiopathy without gangrene: Secondary | ICD-10-CM | POA: Diagnosis not present

## 2024-07-26 DIAGNOSIS — M48061 Spinal stenosis, lumbar region without neurogenic claudication: Secondary | ICD-10-CM | POA: Diagnosis not present

## 2024-07-26 DIAGNOSIS — N2581 Secondary hyperparathyroidism of renal origin: Secondary | ICD-10-CM | POA: Diagnosis not present

## 2024-07-26 DIAGNOSIS — N1831 Chronic kidney disease, stage 3a: Secondary | ICD-10-CM | POA: Diagnosis not present

## 2024-07-26 DIAGNOSIS — E1122 Type 2 diabetes mellitus with diabetic chronic kidney disease: Secondary | ICD-10-CM | POA: Diagnosis not present

## 2024-07-26 DIAGNOSIS — E538 Deficiency of other specified B group vitamins: Secondary | ICD-10-CM | POA: Diagnosis not present

## 2024-07-26 DIAGNOSIS — I701 Atherosclerosis of renal artery: Secondary | ICD-10-CM | POA: Diagnosis not present

## 2024-08-04 DIAGNOSIS — M48061 Spinal stenosis, lumbar region without neurogenic claudication: Secondary | ICD-10-CM | POA: Diagnosis not present

## 2024-08-04 DIAGNOSIS — E538 Deficiency of other specified B group vitamins: Secondary | ICD-10-CM | POA: Diagnosis not present

## 2024-08-04 DIAGNOSIS — I701 Atherosclerosis of renal artery: Secondary | ICD-10-CM | POA: Diagnosis not present

## 2024-08-04 DIAGNOSIS — I150 Renovascular hypertension: Secondary | ICD-10-CM | POA: Diagnosis not present

## 2024-08-04 DIAGNOSIS — N2581 Secondary hyperparathyroidism of renal origin: Secondary | ICD-10-CM | POA: Diagnosis not present

## 2024-08-04 DIAGNOSIS — E1122 Type 2 diabetes mellitus with diabetic chronic kidney disease: Secondary | ICD-10-CM | POA: Diagnosis not present

## 2024-08-04 DIAGNOSIS — E1151 Type 2 diabetes mellitus with diabetic peripheral angiopathy without gangrene: Secondary | ICD-10-CM | POA: Diagnosis not present

## 2024-08-04 DIAGNOSIS — N1831 Chronic kidney disease, stage 3a: Secondary | ICD-10-CM | POA: Diagnosis not present

## 2024-08-09 DIAGNOSIS — M48061 Spinal stenosis, lumbar region without neurogenic claudication: Secondary | ICD-10-CM | POA: Diagnosis not present

## 2024-08-09 DIAGNOSIS — E1122 Type 2 diabetes mellitus with diabetic chronic kidney disease: Secondary | ICD-10-CM | POA: Diagnosis not present

## 2024-08-09 DIAGNOSIS — E1151 Type 2 diabetes mellitus with diabetic peripheral angiopathy without gangrene: Secondary | ICD-10-CM | POA: Diagnosis not present

## 2024-08-09 DIAGNOSIS — I150 Renovascular hypertension: Secondary | ICD-10-CM | POA: Diagnosis not present

## 2024-08-09 DIAGNOSIS — E538 Deficiency of other specified B group vitamins: Secondary | ICD-10-CM | POA: Diagnosis not present

## 2024-08-09 DIAGNOSIS — N2581 Secondary hyperparathyroidism of renal origin: Secondary | ICD-10-CM | POA: Diagnosis not present

## 2024-08-09 DIAGNOSIS — N1831 Chronic kidney disease, stage 3a: Secondary | ICD-10-CM | POA: Diagnosis not present

## 2024-08-09 DIAGNOSIS — I701 Atherosclerosis of renal artery: Secondary | ICD-10-CM | POA: Diagnosis not present

## 2024-08-16 ENCOUNTER — Ambulatory Visit: Attending: Nurse Practitioner | Admitting: Nurse Practitioner

## 2024-08-16 ENCOUNTER — Encounter: Payer: Self-pay | Admitting: Nurse Practitioner

## 2024-08-16 VITALS — BP 125/75 | HR 65 | Temp 97.0°F | Resp 18 | Ht 68.0 in | Wt 175.0 lb

## 2024-08-16 DIAGNOSIS — M47816 Spondylosis without myelopathy or radiculopathy, lumbar region: Secondary | ICD-10-CM

## 2024-08-16 DIAGNOSIS — M533 Sacrococcygeal disorders, not elsewhere classified: Secondary | ICD-10-CM | POA: Diagnosis present

## 2024-08-16 DIAGNOSIS — G8929 Other chronic pain: Secondary | ICD-10-CM | POA: Diagnosis present

## 2024-08-16 DIAGNOSIS — M5416 Radiculopathy, lumbar region: Secondary | ICD-10-CM | POA: Insufficient documentation

## 2024-08-16 DIAGNOSIS — M5136 Other intervertebral disc degeneration, lumbar region with discogenic back pain only: Secondary | ICD-10-CM | POA: Diagnosis present

## 2024-08-16 DIAGNOSIS — M542 Cervicalgia: Secondary | ICD-10-CM | POA: Insufficient documentation

## 2024-08-16 DIAGNOSIS — Z79899 Other long term (current) drug therapy: Secondary | ICD-10-CM

## 2024-08-16 DIAGNOSIS — G894 Chronic pain syndrome: Secondary | ICD-10-CM | POA: Diagnosis present

## 2024-08-16 MED ORDER — HYDROCODONE-ACETAMINOPHEN 5-325 MG PO TABS: 1.0000 | ORAL_TABLET | Freq: Two times a day (BID) | ORAL | 0 refills | Status: AC | PRN

## 2024-08-16 MED ORDER — NALOXONE HCL 4 MG/0.1ML NA LIQD: 1.0000 | NASAL | 1 refills | Status: AC | PRN

## 2024-08-16 NOTE — Patient Instructions (Signed)
 ______________________________________________________________________    Opioid Pain Medication Update  To: All patients taking opioid pain medications. (I.e.: hydrocodone , hydromorphone , oxycodone , oxymorphone, morphine , codeine, methadone, tapentadol, tramadol , buprenorphine, fentanyl , etc.)  Re: Updated review of side effects and adverse reactions of opioid analgesics, as well as new information about long term effects of this class of medications.  Direct risks of long-term opioid therapy are not limited to opioid addiction and overdose. Potential medical risks include serious fractures, breathing problems during sleep, hyperalgesia, immunosuppression, chronic constipation, bowel obstruction, myocardial infarction, and tooth decay secondary to xerostomia.  Unpredictable adverse effects that can occur even if you take your medication correctly: Cognitive impairment, respiratory depression, and death. Most people think that if they take their medication correctly, and as instructed, that they will be safe. Nothing could be farther from the truth. In reality, a significant amount of recorded deaths associated with the use of opioids has occurred in individuals that had taken the medication for a long time, and were taking their medication correctly. The following are examples of how this can happen: Patient taking his/her medication for a long time, as instructed, without any side effects, is given a certain antibiotic or another unrelated medication, which in turn triggers a Drug-to-drug interaction leading to disorientation, cognitive impairment, impaired reflexes, respiratory depression or an untoward event leading to serious bodily harm or injury, including death.  Patient taking his/her medication for a long time, as instructed, without any side effects, develops an acute impairment of liver and/or kidney function. This will lead to a rapid inability of the body to breakdown and eliminate  their pain medication, which will result in effects similar to an overdose, but with the same medicine and dose that they had always taken. This again may lead to disorientation, cognitive impairment, impaired reflexes, respiratory depression or an untoward event leading to serious bodily harm or injury, including death.  A similar problem will occur with patients as they grow older and their liver and kidney function begins to decrease as part of the aging process.  Background information: Historically, the original case for using long-term opioid therapy to treat chronic noncancer pain was based on safety assumptions that subsequent experience has called into question. In 1996, the American Pain Society and the American Academy of Pain Medicine issued a consensus statement supporting long-term opioid therapy. This statement acknowledged the dangers of opioid prescribing but concluded that the risk for addiction was low; respiratory depression induced by opioids was short-lived, occurred mainly in opioid-naive patients, and was antagonized by pain; tolerance was not a common problem; and efforts to control diversion should not constrain opioid prescribing. This has now proven to be wrong. Experience regarding the risks for opioid addiction, misuse, and overdose in community practice has failed to support these assumptions.  According to the Centers for Disease Control and Prevention, fatal overdoses involving opioid analgesics have increased sharply over the past decade. Currently, more than 96,700 people die from drug overdoses every year. Opioids are a factor in 7 out of every 10 overdose deaths. Deaths from drug overdose have surpassed motor vehicle accidents as the leading cause of death for individuals between the ages of 82 and 49.  Clinical data suggest that neuroendocrine dysfunction may be very common in both men and women, potentially causing hypogonadism, erectile dysfunction, infertility,  decreased libido, osteoporosis, and depression. Recent studies linked higher opioid dose to increased opioid-related mortality. Controlled observational studies reported that long-term opioid therapy may be associated with increased risk for cardiovascular events. Subsequent  meta-analysis concluded that the safety of long-term opioid therapy in elderly patients has not been proven.   Side Effects and adverse reactions: Common side effects: Drowsiness (sedation). Dizziness. Nausea and vomiting. Constipation. Physical dependence -- Dependence often manifests with withdrawal symptoms when opioids are discontinued or decreased. Tolerance -- As you take repeated doses of opioids, you require increased medication to experience the same effect of pain relief. Respiratory depression -- This can occur in healthy people, especially with higher doses. However, people with COPD, asthma or other lung conditions may be even more susceptible to fatal respiratory impairment.  Uncommon side effects: An increased sensitivity to feeling pain and extreme response to pain (hyperalgesia). Chronic use of opioids can lead to this. Delayed gastric emptying (the process by which the contents of your stomach are moved into your small intestine). Muscle rigidity. Immune system and hormonal dysfunction. Quick, involuntary muscle jerks (myoclonus). Arrhythmia. Itchy skin (pruritus). Dry mouth (xerostomia).  Long-term side effects: Chronic constipation. Sleep-disordered breathing (SDB). Increased risk of bone fractures. Hypothalamic-pituitary-adrenal dysregulation. Increased risk of overdose.  RISKS: Respiratory depression and death: Opioids increase the risk of respiratory depression and death.  Drug-to-drug interactions: Opioids are relatively contraindicated in combination with benzodiazepines, sleep inducers, and other central nervous system depressants. Other classes of medications (i.e.: certain antibiotics  and even over-the-counter medications) may also trigger or induce respiratory depression in some patients.  Medical conditions: Patients with pre-existing respiratory problems are at higher risk of respiratory failure and/or depression when in combination with opioid analgesics. Opioids are relatively contraindicated in some medical conditions such as central sleep apnea.   Fractures and Falls:  Opioids increase the risk and incidence of falls. This is of particular importance in elderly patients.  Endocrine System:  Long-term administration is associated with endocrine abnormalities (endocrinopathies). (Also known as Opioid-induced Endocrinopathy) Influences on both the hypothalamic-pituitary-adrenal axis?and the hypothalamic-pituitary-gonadal axis have been demonstrated with consequent hypogonadism and adrenal insufficiency in both sexes. Hypogonadism and decreased levels of dehydroepiandrosterone sulfate have been reported in men and women. Endocrine effects include: Amenorrhoea in women (abnormal absence of menstruation) Reduced libido in both sexes Decreased sexual function Erectile dysfunction in men Hypogonadisms (decreased testicular function with shrinkage of testicles) Infertility Depression and fatigue Loss of muscle mass Anxiety Depression Immune suppression Hyperalgesia Weight gain Anemia Osteoporosis Patients (particularly women of childbearing age) should avoid opioids. There is insufficient evidence to recommend routine monitoring of asymptomatic patients taking opioids in the long-term for hormonal deficiencies.  Immune System: Human studies have demonstrated that opioids have an immunomodulating effect. These effects are mediated via opioid receptors both on immune effector cells and in the central nervous system. Opioids have been demonstrated to have adverse effects on antimicrobial response and anti-tumour surveillance. Buprenorphine has been demonstrated to have  no impact on immune function.  Opioid Induced Hyperalgesia: Human studies have demonstrated that prolonged use of opioids can lead to a state of abnormal pain sensitivity, sometimes called opioid induced hyperalgesia (OIH). Opioid induced hyperalgesia is not usually seen in the absence of tolerance to opioid analgesia. Clinically, hyperalgesia may be diagnosed if the patient on long-term opioid therapy presents with increased pain. This might be qualitatively and anatomically distinct from pain related to disease progression or to breakthrough pain resulting from development of opioid tolerance. Pain associated with hyperalgesia tends to be more diffuse than the pre-existing pain and less defined in quality. Management of opioid induced hyperalgesia requires opioid dose reduction.  Cancer: Chronic opioid therapy has been associated with an increased risk  of cancer among noncancer patients with chronic pain. This association was more evident in chronic strong opioid users. Chronic opioid consumption causes significant pathological changes in the small intestine and colon. Epidemiological studies have found that there is a link between opium  dependence and initiation of gastrointestinal cancers. Cancer is the second leading cause of death after cardiovascular disease. Chronic use of opioids can cause multiple conditions such as GERD, immunosuppression and renal damage as well as carcinogenic effects, which are associated with the incidence of cancers.   Mortality: Long-term opioid use has been associated with increased mortality among patients with chronic non-cancer pain (CNCP).  Prescription of long-acting opioids for chronic noncancer pain was associated with a significantly increased risk of all-cause mortality, including deaths from causes other than overdose.  Reference: Von Korff M, Kolodny A, Deyo RA, Chou R. Long-term opioid therapy reconsidered. Ann Intern Med. 2011 Sep 6;155(5):325-8. doi:  10.7326/[id number]. PMID: 78106373; PMCID: [id number]. Kit JINNY Laurence CINDERELLA Pearley JINNY, Hayward RA, Dunn KM, Jordan KP. Risk of adverse events in patients prescribed long-term opioids: A cohort study in the [place] Clinical Practice Research [employer]. Eur J Pain. 2019 May;23(5):908-922. doi: 10.1002/ejp.1357. Epub 2019 Jan 31. PMID: 69379883. Colameco S, Coren JS, Ciervo CA. Continuous opioid treatment for chronic noncancer pain: a time for moderation in prescribing. Postgrad Med. 2009 Jul;121(4):61-6. doi: 10.3810/pgm.2009.07.2032. PMID: 80358728. Gigi JONELLE Shlomo MILUS Levern IVER Conny RN, [place] SD, Blazina I, Lonell DASEN, Bougatsos C, Deyo RA. The effectiveness and risks of long-term opioid therapy for chronic pain: a systematic review for a [employer] of [employer] Pathways to [employer]. Ann Intern Med. 2015 Feb 17;162(4):276-86. doi: 10.7326/M14-2559. PMID: 74418742. Rory CHRISTELLA Laurence [facility], Makuc DM. NCHS Data Brief No. 22. [place]: Centers for Disease Control and Prevention; 2009. Sep, Increase in Fatal Poisonings Involving Opioid Analgesics in the [place] , 1999-2006. Song IA, Choi HR, Oh TK. Long-term opioid use and mortality in patients with chronic non-cancer pain: Ten-year follow-up study in [place] from 2010 through 2019. EClinicalMedicine. 2022 Jul 18;51:101558. doi: 10.1016/j.eclinm.[id number]. PMID: 64124182; PMCID: [id number]. Huser, W., Schubert, T., Vogelmann, T. et al. All-cause mortality in patients with long-term opioid therapy compared with non-opioid analgesics for chronic non-cancer pain: a database study. BMC Med 18, 162 (2020). [email or web address].info/ Rashidian H, Zendehdel K, Kamangar F, Malekzadeh R, Haghdoost AA. An Ecological Study of the Association between Opiate Use and Incidence of Cancers. Addict Health. 2016 Fall;8(4):252-260. PMID: 71180443; PMCID: [id number].  Our Goal: Our goal is to control your pain with means other  than the use of opioid pain medications.  Our Recommendation: Talk to your physician about coming off of these medications. We can assist you with the tapering down and stopping these medicines. Based on the new information, even if you cannot completely stop the medication, a decrease in the dose may be associated with a lesser risk. Ask for other means of controlling the pain. Decrease or eliminate those factors that significantly contribute to your pain such as smoking, obesity, and a diet heavily tilted towards inflammatory nutrients.  Last Updated: 03/09/2023   ______________________________________________________________________       ______________________________________________________________________    Update on Controlled Substance (Opioid) Regulations   To: All patients taking opioid pain medications. (I.e.: hydrocodone , hydromorphone , oxycodone , oxymorphone, morphine , codeine, methadone, tapentadol, tramadol , buprenorphine, fentanyl , etc.)  Re: Review on the state of controlled substance regulations.  Introduction: Rules and regulations associated with all aspects of controlled substances are constantly being modified. Unfortunately we have encountered patients questioning the veracity of the information  that we provide them about these changes. This is intended to provide them with appropriate references and a historical review of these changes.  A Brief History: As of June 16, 2016, the [place]  Government declared the opioid epidemic a public health emergency. Prescription drug monitoring programs (PDMPs) and the [facility] All Schedules Prescription Electronic Reporting Act (NASPER). Before 1800, clinicians regarded pain as an existential phenomenon, a consequence of aging. There was no regulation on the use of cocaine and opioids, resulting in widespread marketing and prescribing for many ailments ranging from diarrhea to toothache. The [employer] of  [phone], passed in response to the sudden emergence of street heroin abuse as well as iatrogenic morphine  dependence, influenced both physician and patient alike to avoid opiates. Patients with unexplained pain in the 1920s were regarded as deluded, malingering, or abusers, and cancer patients through the 1950s were encouraged to wean themselves off opioids until their lives could be measured in weeks. Alongside this opioid evolution, the American Pain Society launched their influential pain as the fifth vital sign campaign in 1995. Concurrently, pharmaceutical companies introduced new formulations, such as extended release oxycodone  (OxyContin ). From 1997 to 2002, OxyContin  prescriptions increased from 670,000 to 6.2 million. However, concerns soon began to surface regarding overzealous opioid treatment. It must be noted that pharmaceutical companies contributed significantly to the rise of the opioid epidemic, receiving considerable reprimands as a consequence. In 2007, as the opioid epidemic began to inflict profound damage, [employer] pleaded guilty to federal charges related to the misbranding of OxyContin . [employer] agreed to pay a total of $634.5 million to resolve Justice Department investigations, as well as a $19.5 million settlement to [address] and the [address] of [place].  In response to the current epidemic, changes in focus to the development of new abuse deterrent opioid formulations at the [place]  Food and Drug Administration (FDA) as well as drafting of new public standards for pain treatment were created at [facility] in 2017. In response to the opioid epidemic, FDA public policy changes were announced in February 2016. Among these new positions were a re-examination of the risk-benefit paradigm for opioids with strict emphasis on the large public health ramifications. The various modified opioids released over the past 20 years, such as tamper-resistant preparation, have had differing levels of success,  and are collectively referred to as Risk Evaluation and Mitigation Strategies (REMS). There is also a growing focus on preventing opioid use disorder (OUD) and on offering affected individuals accessible and effective treatment. [place]  government policy reflects these changes and both the Affordable Care Act and the Mental Health Parity and Addiction Equity Act were major steps forward in treating opioid addiction. The Affordable Care Act, which was signed into law in 2010, with major provisions coming into effect by 2014.  In the 1990s, the intensified marketing of newly reformulated prescription opioid medications (e.g., OxyContin ) and an influential pain advocacy campaign that encouraged greater pain management led to a precipitous rise in opioid use in the [place] . Research from the Centers for Disease Control and Prevention (CDC) shows that prescription opioid sales in the [place]  quadrupled from 1999 to 2010. At the same time, opioid misuse and opioid-involved overdose deaths increased (Figure 1). Between 1999 and 2010, the rate of opioidinvolved overdose deaths in the [place]  doubled from 2.9 to 6.8 deaths per 100,000 people. This initial rise in opioid-related deaths is often referred to as the first wave of the recent opioid crisis.  Between 1999 and 2020, 565,000  Americans died of opioid-involved overdoses. In turn, federal, state, and local governments responded with various legal and policy efforts to curb opioid misuse and drug-related overdose Deaths.  Recent Congresses have enacted several laws addressing the opioid crisis, such as the Comprehensive Addiction and Recovery Act of 2016 (CARA, P.L. 114-198); the 21st Century Cures Act (P.L. 114-255); the Substance UseDisorder Prevention that Promotes Opioid Recovery and Treatment for Patients and Communities Act (SUPPORT Act, P.L. [phone]); the Fentanyl  Sanctions Act (Title LXXII of P.L. [id number]); and the Blocking  Deadly Fentanyl  Imports Act (P.L. 117-81, 6610). These laws addressed overprescribing and misuse of opioids, expanded substance use disorder prevention and treatment capacities, bolstered drug diversion capabilities, and enhanced international drug interdiction, counternarcotics cooperation, and sanctions efforts. Congress also directed additional funds to many of these initiatives through appropriations.  Congress provided funding in the [employer] Act of 2021 [phone]; P.L. 117-2) for syringe services programs (often known as needle exchange programs) and other harm reduction initiatives. Federal and state harm reduction strategies have frequently involved the distribution of naloxone (e.g., Narcan)--a medication used to reverse an opioid overdose--and test strips used to detect fentanyl  in drug samples.  The Department of Justice (DOJ) and Department of Homeland Security [facility]) aim to reduce the diversion of prescription opioids and the use, manufacturing, and trafficking of illicit opioids. DOJ--via the Drug Enforcement Administration (DEA)--regulates opioid manufacturers, distributors, and dispensers; it also controls the opioid supply through enforcement of regulatory requirements.  A History of Opiate Laws in the [place]   Prior to 1890, laws concerning opiates were strictly imposed on a local city or state-by-state basis. One of the first was in [place] in 1875 where it became illegal to smoke opium  only in opium  dens. It did not ban the sale, import or use otherwise. In the next 25 years different states enacted opium  laws ranging from outlawing opium  dens altogether to making possession of opium , morphine  and heroin without a physicians prescription illegal.  The first Congressional Act took place in 1890 that levied taxes on morphine  and opium . From that time on the [employer] has had a series of laws and acts directly aimed at opiate use, abuse  and control. These are outlined below:  1906 - Pure Food and Drug Act Preventing the manufacture, sale, or transportation of adulterated or misbranded or poisonous or deleterious foods, drugs, medicines, and liquors, and for regulating traffic therein, and for other purposes. Punishment included fines and prison time.  1909   - Smoking Opium  Exclusion Act Banned the importation, possession and use of smoking opium . Did not regulate opium -based medications. First Freight forwarder banning the non-medical use of a substance.  1914  - The Margrette Act In summary, The Margrette Act of 1914 was written more to have all parties involved in importing, exporting, set designer and distributing opium  or cocaine to register with the [employer] and have taxes levied upon them. Exempt from the law were physicians operating in the course of his professional practice  1919 - Supreme Court ratified the Bj's in Buckhorn et al., v. [place]  and [place]  v. [place], then again in [facility] v. [place] , in 1920, holding that doctors may not prescribe maintenance supplies of narcotics to people addicted to narcotics. However, it does not prohibit doctors from prescribing narcotics to wean a patient off of the drug. It was also the opinion of the court that prescribing narcotics to habitual users was not considered professional practice hence it  then was considered illegal for doctors to prescribe opioids for the purposes of maintaining an addiction. It can be argued that todays addiction medications are not intended to maintain an addiction but to facilitate addiction remission. In which case, this opinion of the court should not preclude practitioners from prescribing buprenorphine or methadone to patients suffering from an addictive disorder.  1924  - Heroin Act Architectural technologist, importation and possession of heroin illegal - even for medicinal use.  1922 -- Narcotic  Drug Import and Export Act Enacted to assure proper control of importation, sale, possession, production and consumption of narcotics.  1927  -- Special Educational Needs Teacher of [employer] of Prohibition was responsible for tracking bootleggers and organized conservation officer, historic buildings. They focused primarily on interstate and international cases and those cases where local law enforcement official would not or could not act.  1932 -- Uniform State Narcotic Act Encouraged states to pass uniform state laws matching the federal Narcotic Drug Import and Export Act. Suggested prohibiting cannabis use at the state level.  41 -- Food, Drug, and Cosmetic Act The new law brought cosmetics and medical devices under control, and it required that drugs be labeled with adequate directions for safe use. Moreover, it mandated pre-market approval of all new drugs, such that a manufacturer would have to prove to FDA that a drug were safe before it could be sold  1951 -- Boggs Act Imposed maximum criminal penalties for violations of the import/export and internal revenue laws related to drugs and also established mandatory minimum prison sentences.  1956 -- Narcotics Control Act Increased Boggs Act penalties and mandatory prison sentence minimums for violations of existing drug laws.  1965 -- Drug Abuse Control Amendment Enacted to deal with problems caused by abuse of depressants, stimulants and hallucinogens. Restricted research into psychoactive drugs such as LSD by requiring FDA approval.  1970 -- Controlled Substance Act  Controlled Substances Import and Export Act These laws are a consolidation of numerous laws regulating the manufacture and distribution of narcotics, stimulants, depressants, hallucinogens, anabolic steroids, and chemicals used in the illicit production of controlled substances. The CSA places all substances that are regulated under existing federal law into one of five schedules. This placement is based upon  the substance's medicinal value, harmfulness, and potential for abuse or addiction. Schedule I is reserved for the most dangerous drugs that have no recognized medical use, while Schedule V is the classification used for the least dangerous drugs. The act also provides a mechanism for substances to be controlled, added to a schedule, decontrolled, removed from control, rescheduled, or transferred from one schedule to another.  36 - Drug Enforcement Agency By Executive Order, the DEA was formed to take place of the [employer] of Narcotics and Dangerous Drugs.  37 -Narcotic Addict Treatment Act of  1974  - Public Law [phone] Amends the Controlled Substance Act of 1970 to provide for the registration of practitioners conducting narcotic treatment programs. [methadone clinics] It also provides legal definitions for the phrases maintenance treatment and detoxification treatment.  1986 -- Anti-Drug Abuse Act of 1986 Strengthened Federal efforts to encourage foreign cooperation in eradicating illicit drug crops and in halting international drug traffic, to improve enforcement of Federal drug laws and enhance interdiction of illicit drug shipments, to provide strong Market researcher in establishing effective drug abuse prevention and education programs, to [employer] support for drug abuse treatment and rehabilitation efforts, and for other purposes. It also re-imposed mandatory sentencing minimums depending on which drug and how  much was involved.  1988 -- Anti-Drug Abuse Act of 1988 Established the Office of Materials Engineer (ONDCP) in the [employer] of the Economist; authorized funds for [employer], state and local drug enforcement activities, school-based drug prevention efforts, and drug abuse treatment with special emphasis on injecting drug abusers at high risk for AIDS.  2000 -- Federal - The Drug Addiction Treatment Act of 2000 (DATA 2000) It enables qualified physicians to  prescribe and/or dispense narcotics for the purpose of treating opioid dependency. For the first time, physicians are able to treat this disease from their private offices or other clinical settings. This presents a very desirable treatment option for those who are unwilling or unable to seek help in drug treatment clinics. Patients can now be treated in the privacy of their doctors office, as are other people being treated for any other type of medical condition. One medicine doctors may now prescribe is Buprenorphine. The major downfall of this Act is the limitation of 30 patients per practice - which means that large facilities, no matter how many physicians are there, can only treat 30 patients at a time.  2002-- DEA reschedules buprenorphine from a schedule V drug to a schedule III drug, on June 07, 2001 - the day before the FDA approval of Suboxone and Subutex despite overwhelming objection by the medical community.  2004: June 2004 THE CONFIDENTIALITY OF ALCOHOL AND DRUG ABUSE PATIENT RECORDS REGULATION AND THE HIPAA PRIVACY RULE:  Confidentiality of Alcohol and Drug Dependence Patient Records (summary) Code of Federal Regulations Title 42 Part 2 (42 CFR Part 2)  The confidentiality of alcohol and drug dependence patient records maintained by this practice/program is protected by federal law and regulations. Generally, the practice/program may not say to a person outside the practice/program that a patient attends the practice/program, or disclose any information identifying a patient as being alcohol or drug dependent unless:  The patient consents in writing; The disclosure is allowed by a court order, or The disclosure is made to medical personnel in a medical emergency or to qualified personnel for research,  audit, or practice/program evaluation. Violation of the federal law and regulations by a practice/program is a crime. Suspected violations may be reported to appropriate authorities  in accordance with federal regulations. Freight forwarder and regulations do not protect any information about a crime committed by a patient either at the practice/program or against any person who works for the practice/program or about any threat to commit such a crime. Federal laws and regulations do not protect any information about suspected child abuse or neglect from being reported under state law to appropriate state or local authorities.  sample consent form (MS-WORD)  2005: 04-02-2004 Public law [phone], Amends the Controlled Substances Act to eliminate the 30-patient limit for medical group practices allowed to dispense narcotic drugs in schedules III, IV, or V for maintenance or detoxification treatment (retains the 30-patient limit for an individual physician). This amendment removes the 30-patient limit on group medical practices that treat opioid dependence with buprenorphine. The restriction was part of the original Drug Addiction Treatment Act of 2000 (DATA) that allowed treatment of opioid dependence in a doctor's office. With this change, every certified doctor may now prescribe buprenorphine up to his or her individual physician limit of 30 patients.  2006: On 08/29/2005 President Levy signed Bill H.R.6344 into law. This allows physicians who have been certified to prescribe certain drugs for the treatment of opioid dependence under DATA2000 to treat up to  100 patients (up from 30) by submitting an intent notification to the Dept of Health and [employer]. This is a major step forward in both fighting the stigma and allowing access to treatment previously not available to some. For more details see 30/100-PATIENT LIMIT  2016: HHS augments regulations concerning the 30/100 patient limit by raising the limit to 275 for qualifying physicians. Link to summary of regulation  2016: Comprehensive Addiction and Recovery Act of 2016 (sec.303) amends the Controlled Substance Act - to allow Nurse  Practitioners and Physician Assistants to become eligible to prescribe buprenorphine for the treatment of opioid use disorder. See the entire law for more details.  The roots of the concurrent regulation of certain drugs under two statutory schemes go back to the beginning of this century. In 1906, Congress enacted the Pure Food and Drug Act, establishing one regime of regulation to assure (among other things) that drugs were not adulterated or misbranded. These regulations were amended several times, recodified in 1938, and expanded on again from the 1940s through the 1990s. Their implementation and enforcement is today assigned to the Food and Drug Administration (FDA) in the Department of Health and Human Services [facility]).  In 1914, Congress adopted the [place] Narcotic Act to stop abuse of addictive drugs. The [place] Narcotic Act was amended in 1937 to include marijuana. In 1965, amphetamines, barbiturates, and hallucinogens came under regulation, but under the [employer], Drug, and Cosmetic Act. In 1970, these various statutes were consolidated and recodified as the Controlled Substances Act (CSA), which has been amended several times since then. Its implementation and enforcement is today assigned to the Drug Enforcement Administration (DEA) in the Department of Justice.  The first clash occurred after World War I, when so-called morphine  clinics existed and physicians prescribed or dispensed morphine  to addicts. Some addicts were veterans of the American Civil War, the Spanish-American War, and WWI, who had become addicted during treatment for war wounds, but most of them came from the growing population of nonmedical addicts ([place], 8017). The Narcotics Division of the Prohibition Unit of the Department of the Treasury, which was then responsible for enforcing the [facility] Narcotic Act, concluded that this activity was not the legitimate practice of medicine but simple drug trafficking. The  Treasury Department swiftly closed the clinics and made it personally and professionally risky for physicians to maintain a narcotic addict for any reason. In did so, however, only after the American Medical Association had adopted a resolution, in 1920, opposing ambulatory clinics''.  In 1972, the public health establishment, including the Secretary of Health, Education, and Welfare, the Education Officer, Environmental, the [employer] of [employer], and the Chemical Engineer for Drug Abuse Prevention, was unprepared to allow [employer] of Narcotics and Dangerous Drugs, DEA's predecessor agency, to unilaterally define the parameters of medical practice for the use of methadone in the treatment of heroin addiction. As a consequence, a new set of rules--the third, on top of the FDA and DEA schemes--was added, one that inserted FDA deeply into the practice of medicine, notwithstanding its protestations to the contrary. Congress ratified this joint responsibility of law enforcement and public health officials for methadone through this third set of rules in 1974 with the passage of the Narcotic Addict Treatment Act (NATA). To examine in detail the evolution of this third set of rules--commonly referred to as the FDA or DHHS methadone regulations--we turn, first, to the period of the mid-1960s.  Increased use of heroin in the  post World War II period first became apparent in the early to mid 1950s. During the [employer], a minimum mandatory narcotics law was enacted in 1956, effective July 1957. 1962 [facility] conference on drug abuse, the [employer] on Narcotic and Drug Abuse (the [employer]) of 1963, the Drug Abuse Control Amendments of 1965, the President's Commission on [employer] and Administration of Justice (the [employer]) of [phone], and the Narcotic Addiction Rehabilitation Act of 1966.  The 1965 Drug Abuse Control Amendments  brought under strict federal control all nonnarcotic drugs capable of producing serious psychotoxic effects when abused. This act also created the [employer] of Drug Abuse Control within the Department of Health, Education, and Welfare (DHEW) and shifted the basis for aon corporation of illegal drugs from tax principles (administered by the Department of Treasury) to the regulation of commerce (administered by the [employer]).  The 1966 Narcotic Addiction Rehabilitation Act TOUR MANAGER) authorized the civil commitment of narcotic addicts, and federal assistance to state and local governments to develop a local system of drug treatment programs. With respect to the latter, the [employer] of Mental Health [facility]) initially proposed the gradual implementation of the state assistance effort, mainly through a common mental health mechanism--inpatient treatment programs. However, because of a perceived pressing need, the courts began to commit addicts to these programs even before they were officially opened or staffed. The NARA legislation imposed the following contract requirements on treatment centers: (1) thrice-a-week counseling sessions; (2) weekly urine tests; (3) restorative dental services; (4) psychological consultations and vocational training; and (5) the treatment modalities of drug-free outpatient, therapeutic community, and methadone maintenance. Reorganization Plan No. 1 of 1968 transferred the primary functions of the [employer] of Narcotics (FBN) from the [employer] to the Department of Justice; it also transferred the [employer] of Drug Abuse Control functions to the Department of Justice. Within the [employer], the [employer] of Narcotics and Dangerous Drugs (BNDD) was created, which became the Drug Enforcement Administration in 1973.   Under the first [place] administration [phone]), federal drug abuse policy developed in a significant way. These developments included a 1969 war  on drugs presidential message, resulting legislation in 1970, and a Special Action Office created by executive order in 1971 and authorized in statute in 1972. Brynn, in 1969, to send a message to Congress on drug abuse. Although this was the first time that a U.S. president invoked the war on drugs image, it was in retrospect the most balanced approach to the problem of drug abuse that had been advanced. The 1969 message resulted in the submission of legislation to the Congress and the passage, the following year, of the Comprehensive Drug Abuse Prevention and Control Act of 1970 [employer] [phone], June 27, 1969). The act dealt with research, treatment, and prevention of drug abuse and drug dependence, and with drug abuse charity fundraiser. One major purpose of the 1970 legislation was to reverse some of the strictures of the [employer] of 1914. The 1970 act sought to clarify for the medical profession . . . the extent to which they may safely go in treating narcotic addicts as patients. Title I, in Section IV, charged the Surveyor, Minerals, Education, and Welfare, to determine the appropriate methods of professional practice in the medical treatment of the narcotic addiction of various classes of narcotic addicts. This provision constitutes the initial statutory basis for treatment standards. The law enforcement sections consolidated all prior federal statutes into the  Controlled Substances Act and the Controlled Substances Export and Import Act (Titles II and III, respectively, of the Comprehensive Drug Abuse Prevention and Control Act of 1970). Under this legislation, substances were classified under five schedules according to their abuse potential, and psychological and physical effects. Methadone was placed in Schedule II, along with such opiate drugs as morphine , codeine, and hydrocodone .  One of the most important steps taken by President Brynn was to establish in June 1971 the  Special Action Office for Drug Abuse Prevention (SAODAP) in the [employer] of the President ([place] [phone], February 15, 1970). In mid-1971, [facility] appointed Dr. Maple Dunnings as SAODAP director. Within a year, the Drug Abuse Prevention Office and Treatment Act of 1972 [employer] [phone], November 20, 1970) gave statutory authority to [facility], but limiting setting, on February 28, 1974, as the limit on its existence.  The purpose of the 1972 act was to bring the resources of the federal government to bear on drug abuse with the immediate objective of significantly reducing its incidence and developing a comprehensive, coordinated long-term federal strategy to combat drug abuse.  Narcotic Addict Treatment Act [employer]) of 1974 [employer] [phone]), which amended the Controlled Substances Act. This legislation was driven by concern for the diversion of methadone to illicit channels that was occurring in 1972 and 1973, as reflected in the title of the Senate bill adopted on February 07, 1972, the Methadone Diversion Control Act of 1973. (U.S. Senate, 1970a, 8029a).  The 1980 final rule (45 FR 37305, May 21, 1979) reduced the minimum standard for admission from two years of addiction to one year coupled with a clinical determination that the individual was currently physiologically.  The regulations were next revised in 1989, following two proposals to modify them, one in 1983 and one in 1987.  Under President Tanda Corrente, a government-wide effort was made to review all federal government regulations and to eliminate or reduce the burden of these regulations on the private sector, state and nash-finch company, and wps resources.   The 1983 recommendations, though not adopted, did initiate another revision of the methadone regulations, which first found expression in a 1987 proposed rule (52 FR 37047, June 02, 1986) and culminated in a final rule (54 FR 8954, November 01, 1987) at the end of the  decade. In the 1987 proposed rule, the FDA and NIDA, in an effort to put the best face on the unenthusiastic 1983 response by the provider community to converting the regulations to guidelines, indicated that they had retained the current requirements necessary to achieve the goals of the 1974 NATA, but were proposing to streamline the regulation and to promote more efficient operation of methadone programs. The 1987 proposed rule, issued by the FDA and NIDA, advanced the following changes in the methadone regulation: that detoxification treatment be divided into short-term (<21 days) and long-term (>21 and <180 days) treatment; that the minimum staffing ratio of one counselor to 50 patients be eliminated; that blood tests be allowed as ways to conduct initial drug screening or to meet the monthly testing requirements for six-day take-home patients; that the 72-hour notification of FDA and the pertinent state authority for methadone doses greater than 100 mg be eliminated; that special adverse reaction reporting requirements for methadone be eliminated and reliance placed upon general FDA reporting requirements; that a supervising counselor be allowed to conduct the annual review of the patient's treatment plan for certain qualified patients who had been in treatment for 3 years  or longer; and that the requirement of an annual report of methadone treatment programs to the FDA be dropped. The FDA and NIDA issued a final rule on November 01, 1987, based on comments on the 1987 proposed (54 FR 8954). Concurrently, FDA and NIDA issued a six-page guidance document, which noted that the regulations, over time, had recommended certain practices that were not actually required. Public Health Service, in Congress, and elsewhere, to reorganize the Alcohol, Drug Abuse, and Mental Health Administration (ADAMHA). These efforts culminated in the [employer] of 1992 [employer] [phone], March 11, 1991), the main  purpose of which was to transfer the research portions of the three ADAMHA institutes--NIDA, the [employer] Mills of Alcoholism and Alcohol Abuse, and the General Mills of Mental Health--to the [employer] and to create the Substance Abuse and Museum/gallery Exhibitions Officer [facility]) as the home for the service functions of these entitles.  Guidelines for Opioid Treatment The Federal Guidelines for Opioid Treatment Programs - 2015 serve as a guide to accrediting organizations for developing accreditation standards. The guidelines also provide OTPs with information on how programs can achieve and maintain compliance with federal regulations. The 2015 guidelines are an update to the 2007 Guidelines for the Accreditation of Opioid Treatment Programs (PDF  547 KB). The new document reflects the obligation of OTPs to deliver care consistent with the patient-centered, integrated, and recovery-oriented standards of substance use treatment.  DPT oversees the certification of OTPs and provides guidance to nonprofit organizations and state governmental entities that want to become a SAMHSA-approved accrediting body. Learn more about the accreditation and certification of OTPs and [facility] oversight of OTP accreditation bodies.  Model Guidelines for [employer] With input from [facility], the Federation of [employer] in 2013 adopted a revised version of the federations office-based opioid treatment policies. The Model Policy on DATA 2000 and Treatment of Opioid Addiction in the Medical Office - 2013 (PDF  279 KB) provides model guidelines for use by state medical boards in regulating office-based opioid treatment.  Holiday Guidance for Opioid Treatment Programs (PDF  203 KB) In response to requests for the upcoming federal holidays and ensuing weekends (December 24th, 25th, and 26th and December 31st, Jan 1st, and Jan 2nd), this letter is to provide guidance  regarding requests for unsupervised doses of medication for patients for these dates. View a sample [id number] (PDF  194 KB).  Federal regulation of drugs emerged as early as 53, under a law that addressed only imported drugs. In 1905 the [employer] launched a private, voluntary means of controlling a substantial part of the drug marketplace, a system that remained in place for over a half-century. Drug regulation in FDA has evolved considerably since President Ricardo Para signed the 1906 Pure Food and Drugs Act.  1820 Eleven doctors set up the U.S. Pharmacopeia and record the first list of standard drugs. 1848 Drug Importation Act passed by Congress requires U.S. Customs Service inspection to stop entry of tainted, low quality drugs from overseas. 8116 Dr. Mitchell MICAEL Burrs becomes the chief chemist at the [facility] of [employer] adulteration studies.  1905 The American Medical Association [facility]) begins a voluntary program of drug approval that would last until 1955. In order to advertise in the [facility] and related journals, drug companies must show proof that the drug will treat what they claim. 1906 The original Food and Drug Act is passed by Congress on June 30 and signed by [employer].  The Act outlaws states from buying and selling food, drinks, and drugs that have been mislabeled and tainted. 1911 In U.S. v. Vicci, the [employer] that the [employer] and Drugs Act does not outlaw false medical claims but only false and misleading statements about the ingredients or identity of a drug. 1912 Congress passes the [place] Amendment to overcome the ruling in U.S. v. Vicci. The Act outlaws labeling medicines with fake medical claims that is meant to trick the buyer. 1930 The name of the Food, Drug, and Insecticide Administration is shortened to Food and Drug Administration (FDA) under an therapist, music. 1933 FDA  recommends a total rewrite of the out-of-date 1906 Food and Drugs Act.   1937 Elixir Sulfanilamide, contain the poisonous liquid, diethylene glycol, kills 107 persons, many of whom are children, dramatizing the need to establish drug safety before marketing and to pass the pending food and drug law. 1938 Congress passes [employer], Drug, and Cosmetic (FDC) Act of 1938, which requires that new drugs show safety before selling. This starts a new system of drug regulation. The Act also requires that safe limits be set for unavoidable poisonous matter and allows for factory inspections. The Directv is given power to oversee advertising for all FDAregulated products except prescription drugs. FDA states that sulfanilamide and other dangerous drugs must be given under the direction of a medical expert. This begins the requirement for prescription only (nonnarcotic) drugs (see 1951 Concrete-Humphrey amendment). 1941 Nearly 300 deaths and injuries result from the use of sulfathiazole tablets, an antibiotic, tainted with the sedative, phenobarbital. In response, FDA drastically changes manufacturing and quality controls. These changes lead to the development of good manufacturing practices (GMPs). 1948 The [employer] in U.S. v. Floretta that FDA jurisdiction extends to retail stores, thereby allowing FDA to stop illegal sales of drugs by pharmacies including barbiturates and amphetamines. 1950 In Walgreen. v. U.S., a U.S. Court of Appeals rules that the directions for use on a drug label must include the drugs purpose. 1951 Congress passes the Dundee-Humphrey Amendment, which defines the kinds of drugs that cannot be used safely without medical supervision. The amendment limits sale of these drugs to prescription only by a medical professional. All other drugs are to be available without a prescription. 1952 A nationwide investigation by FDA  reveals that chloramphenicol, an antibiotic, caused nearly 180 cases of often deadly blood diseases. Two years later FDA engages the [employer] of Hospital Pharmacists, the American Association of Medical Record Librarians, and later the American Medical Association in a voluntary program of drug reaction reporting. 1953 The [employer] Amendment clarifies previous law and requires FDA to give manufacturers written reports of conditions seen during inspections and results of factory samples. 1962 Thalidomide, a new sleeping pill, causes severe birth defects of the arms and legs in thousands of babies born in [place]. The U.S. media reports on how Dr. Cathlean Mort, a FDA medical officer, helped prevent approval and marketing of Thalidomide in the [place] . These reports stirred up public support for stronger drug laws. 3 Congress passes the [employer]. For the first time, these laws require drug makers to prove their drug works before FDA can approve them for sale. The Advisory Committee on Investigational Drugs meets for the first time. This was the first meeting of a committee to advise FDA on product approval and policy on an ongoing basis. 1966 FDA contracts with the [facility]  of [employer] to measure the effectiveness of 4,000 marketed drugs approved on the basis of safety alone between [phone] and 1961-09-09. The Fair Packaging and Labeling Act requires all consumer products, in interstate commerce, to be honestly and informatively labeled. 1967/09/10 FDA forms the Drug Efficacy Study Implementation (DESI) to carry out recommendations of the [employer] of the effectiveness of drugs first sold between [place] and 1963/01/09January 09, 1971 FDA requires the first patient package insert, medicines must come with information for the patient about risks and benefits. 1972 Over-the-Counter Drug Review begins  to enhance the safety, effectiveness and appropriate labeling of drugs sold without prescription. 1973 The U.S. Supreme Court upholds the [place] drug effectiveness law and approves FDAs action to control entire classes of products. 1982 FDA issues Tamper-resistant Packaging Regulations to prevent poisonings such as deaths from cyanide placed in Tylenol  capsules. Congress passes the [employer] in 09-Sep-1982, making it a crime to tamper with packaged consumer products. 1983/09/10 Drug Price Advertising Account Planner Act (Hatch-Waxman Act) increases the availability of less costly generic drugs by allowing FDA to approve applications for generic versions of brand-name drugs without repeating the research that proved the safety and effectiveness of the brand-name drugs. The Act also allowed brand-name companies to apply for up to five years additional patent protection for the new medicines they developed to make up for time lost while their products were going through FDA's approval process. 1989 The FDA issued guidelines asking drug makers to decide if a drug is likely to have usefulness in elderly people and to include elderly people in studies when applicable. 1991 In 09-Sep-1980, the FDA and the Department of Health and Human Services published a policy on protecting people in research. In 09-09-90, this policy is adopted by more than a dozen federal agencies involved in human subject research and becomes known as the Common Rule. 4 1993 FDA launches MedWatch, a system designed to collect reports from health professionals on problems with drugs and other medical products. FDA issues guidelines for measuring gender differences in responses to medication. Drug companies are encouraged to include patients of both sexes in their research of drugs and to study any gender-specific effects. 1995 FDA declares cigarettes to be drug delivery devices. Limits are issued on marketing and  sales to reduce smoking by young people. 1998 FDA introduces the Adverse Event Reporting System (AERS), a computerized database designed to store and study safety reports on already marketed drugs.  The Demographic Rule requires that a marketing application review data on safety and effectiveness by age, gender, and race. The Pediatric Rule requires drug makers of selected new and existing drugs to conduct studies on drug safety and effectiveness in children. 1999 Creation of the Drug Facts Label for OTC drug products. The law requires all overthe-counter drug labels to have information in a standard format. These drug facts labels are designed to give the user easy-to-find information. 2000 The U. S. [employer], upholds an earlier decision from [employer] and Drug Administration v. [employer]. et al. and rules 5-4 that FDA does not have authority to regulate tobacco as a drug. September 09, 2001 The Best Pharmaceuticals for Children Act, in exchange for studying the drug in children, the drug maker gets six months of selling their product without competition. 09/09/02 The Pediatric Research Equity Act gives FDA the right to ask drug companies to study the effectiveness of new drugs in children. Sep 10, 2003 FDA advises medical professionals to  limit the use of a pain reliever called Cox-2, a nonsteroidal anti-inflammatory drug (NSAIDs). Studies had shown that long-term use raised chances of heart attacks and strokes. The warning is also added to the over-thecounter NSAIDs Drug Facts label. Medicines used in hospitals must have a bar code to prevent patients from receiving the wrong medicine. 5 2005 The Drug Safety Board is formed, consisting of FDA staff and representatives from the [employer] of [address] and the [employer]. The Board advises the Director, Center for Drug Evaluation and Research, FDA, on drug safety issues and works with the agency in sharing safety  information to health professionals and patients.  The [place]  Food and Drug Administration (FDA) was first created to enforce the Pure Food and Drug Act of 1906. In this capacity, the FDA is charged with protecting the health of the [place]  public, to ensure the quality of its food, medicine, and cosmetics. Before this time, the [place]  government had no formal oversight of these products and left issues of quality and purity to the individual manufactures, or at times, individual states.    Review: [place] Stop ACT. (The Strengthen Opioid Misuse Prevention (STOP) Act of 2017). GENERAL ASSEMBLY OF [place]  SESSION 2017 SESSION LAW 2017-74 HOUSE BILL 243  PMP mandatory The dispenser shall report: (1) The dispenser's DEA number. (2) The name of the patient for whom the controlled substance is being dispensed, and the patient's: a. Full address, including city, state, and zip code, b. Telephone number, and c. Date of birth. (3) The date the prescription was written. (4) The date the prescription was filled. (5) The prescription number. (6) Whether the prescription is new or a refill. (7) Metric quantity of the dispensed drug. (8) Estimated days of supply of dispensed drug, if provided to the dispenser. (9) National Drug Code of dispensed drug. (10) Prescriber's DEA number. (11) Method of payment for the prescription.  No paper prescriptions  Duration of scripts Acute vs Chronic prescribing  2016 CDC Guidelines for prescribing Opioids for Chronic Pain. (Updated in 2022.) Medical Board  Laws:  Prescription Laws Drug laws, rules, and regulations are constantly changing. Any attempt to summarize them would quickly become outdated. Because of that, the Board encourages practitioners who seek guidance on prescribing procedures to refer to the sources listed below in addition to the Boards position statements, rules and Medical Practice Act.  [place]  Board of Pharmacy  (NCBOP) (which offers the states pharmacy laws and rules, and links to the Code of Federal Regulations) [employer] Site: www.ncbop.org  [place]  General Statutes General Web Site: politicalpool.cz See: [place]  Food, Drug, and Cosmetic Act: S293155 & 106-134 See: [place]  Pharmacy Practice Act, Article 4A: [phone] See: [place]  Controlled Substances Act, Article 5: 90-86 & 90-113.8 See: Use of controlled substances to render one mentally incapacitated or physically helpless: [facility]. Code, Title 21, Food & Drugs www.deadiversion.usdoj.gov Controlled Substances Schedules www.deadiversion.usdoj.gov Drug Warehouse Manager - www.deadiversion.usdoj.gov 42 CFR  8.12 - Federal opioid treatment standards.   Effective April 27, 2016, prior approval will be required for opioid analgesic doses for [facility]. Medicaid and [place] [facility]) beneficiaries which:  Exceed 120 mg of morphine  equivalents (MME) per day  Are greater than a 14-day supply of any opioid, or,  Are non-preferred opioid products on the [place] Medicaid Preferred Drug List (PDL)  FEDERAL 42 CFR  8.12 - Federal opioid treatment standards. Title II of the Comprehensive Drug Abuse  Prevention and Control Act of 1970, commonly known as the Controlled Substance Act (CSA) Title 21 [place]  Code ([place]) Controlled Substances Act.   Reference:   ______________________________________________________________________       ______________________________________________________________________    Medication Rules  Purpose: To inform patients, and their family members, of our medication rules and regulations.  Applies to: All patients receiving prescriptions from our practice (written or electronic).  Pharmacy of record: This is the pharmacy where your electronic prescriptions will be sent. Make sure we have the correct one.  Electronic  prescriptions: In compliance with the [place]  Strengthen Opioid Misuse Prevention (STOP) Act of 2017 (Session Law 2017-74/H243), effective September 01, 2018, all controlled substances must be electronically prescribed. Written prescriptions, faxing, or calling prescriptions to a pharmacy will no longer be done.  Prescription refills: These will be provided only during in-person appointments. No medications will be renewed without a face-to-face evaluation with your provider. Applies to all prescriptions.  NOTE: The following applies primarily to controlled substances (Opioid* Pain Medications).   Type of encounter (visit): For patients receiving controlled substances, face-to-face visits are required. (Not an option and not up to the patient.)  Patient's Responsibilities: Pain Pills: Bring all pain pills to every appointment (except for procedure appointments). Pill counts are required.  Pill Bottles: Bring pills in original pharmacy bottle. Bring bottle, even if empty. Always bring the bottle of the most recent fill.  Medication refills: You are responsible for knowing and keeping track of what medications you are taking and when is it that you will need a refill. The day before your appointment: write a list of all prescriptions that need to be refilled. The day of the appointment: give the list to the admitting nurse. Prescriptions will be written only during appointments. No prescriptions will be written on procedure days. If you forget a medication: it will not be Called in, Faxed, or electronically sent. You will need to get another appointment to get these prescribed. No early refills. Do not call asking to have your prescription filled early. Partial  or short prescriptions: Occasionally your pharmacy may not have enough pills to fill your prescription.  NEVER ACCEPT a partial fill or a prescription that is short of the total amount of pills that you were prescribed.  With  controlled substances the law allows 72 hours for the pharmacy to complete the prescription.  If the prescription is not completed within 72 hours, the pharmacist will require a new prescription to be written. This means that you will be short on your medicine and we WILL NOT send another prescription to complete your original prescription.  Instead, request the pharmacy to send a carrier to a nearby branch to get enough medication to provide you with your full prescription. Prescription Accuracy: You are responsible for carefully inspecting your prescriptions before leaving our office. Have the discharge nurse carefully go over each prescription with you, before taking them home. Make sure that your name is accurately spelled, that your address is correct. Check the name and dose of your medication to make sure it is accurate. Check the number of pills, and the written instructions to make sure they are clear and accurate. Make sure that you are given enough medication to last until your next medication refill appointment. Taking Medication: Take medication as prescribed. When it comes to controlled substances, taking less pills or less frequently than prescribed is permitted and encouraged. Never take more pills than instructed. Never take the medication more frequently than  prescribed.  Inform other Doctors: Always inform, all of your healthcare providers, of all the medications you take. Pain Medication from other Providers: You are not allowed to accept any additional pain medication from any other Doctor or Healthcare provider. There are two exceptions to this rule. (see below) In the event that you require additional pain medication, you are responsible for notifying us , as stated below. Cough Medicine: Often these contain an opioid, such as codeine or hydrocodone . Never accept or take cough medicine containing these opioids if you are already taking an opioid* medication. The combination may cause  respiratory failure and death. Medication Agreement: You are responsible for carefully reading and following our Medication Agreement. This must be signed before receiving any prescriptions from our practice. Safely store a copy of your signed Agreement. Violations to the Agreement will result in no further prescriptions. (Additional copies of our Medication Agreement are available upon request.) Laws, Rules, & Regulations: All patients are expected to follow all [address] and [employer], Rules, [place] [employer]. Ignorance of the Laws does not constitute a valid excuse.  Illegal drugs and Controlled Substances: The use of illegal substances (including, but not limited to marijuana and its derivatives) and/or the illegal use of any controlled substances is strictly prohibited. Violation of this rule may result in the immediate and permanent discontinuation of any and all prescriptions being written by our practice. The use of any illegal substances is prohibited. Adopted CDC guidelines & recommendations: Target dosing levels will be at or below 60 MME/day. Use of benzodiazepines** is not recommended. Urine Drug testing: Patients taking controlled substances will be required to provide a urine sample upon request. Do not void before coming to your medication management appointments. Hold emptying your bladder until you are admitted. The admitting nurse will inform you if a sample is required. Our practice reserves the right to call you at any time to provide a sample. Once receiving the call, you have 24 hours to comply with request. Not providing a sample upon request may result in termination of medication therapy.  Exceptions: There are only two exceptions to the rule of not receiving pain medications from other Healthcare Providers. Exception #1 (Emergencies): In the event of an emergency (i.e.: accident requiring emergency care), you are allowed to receive additional pain medication. However, you are  responsible for: As soon as you are able, call our office [phone], at any time of the day or night, and leave a message stating your name, the date and nature of the emergency, and the name and dose of the medication prescribed. In the event that your call is answered by a member of our staff, make sure to document and save the date, time, and the name of the person that took your information.  Exception #2 (Planned Surgery): In the event that you are scheduled by another doctor or dentist to have any type of surgery or procedure, you are allowed (for a period no longer than 30 days), to receive additional pain medication, for the acute post-op pain. However, in this case, you are responsible for picking up a copy of our Post-op Pain Management for Surgeons handout, and giving it to your surgeon or dentist. This document is available at our office, and does not require an appointment to obtain it. Simply go to our office during business hours (Monday-Thursday from 8:00 AM to 4:00 PM) (Friday 8:00 AM to 12:00 Noon) or if you have a scheduled appointment with us , prior to your surgery,  and ask for it by name. In addition, you are responsible for: calling our office [phone], at any time of the day or night, and leaving a message stating your name, name of your surgeon, type of surgery, and date of procedure or surgery. Failure to comply with your responsibilities may result in termination of therapy involving the controlled substances.  Consequences:  Non-compliance with the above rules may result in permanent discontinuation of medication prescription therapy. All patients receiving any type of controlled substance is expected to comply with the above patient responsibilities. Not doing so may result in permanent discontinuation of medication prescription therapy. Medication Agreement Violation. Following the above rules, including your responsibilities will help you in avoiding a Medication  Agreement Violation (Breaking your Pain Medication Contract).  *Opioid medications include: morphine , codeine, oxycodone , oxymorphone, hydrocodone , hydromorphone , meperidine , tramadol , tapentadol, buprenorphine, fentanyl , methadone. **Benzodiazepine medications include: diazepam (Valium), alprazolam (Xanax), clonazepam (Klonopine), lorazepam  (Ativan ), clorazepate (Tranxene), chlordiazepoxide (Librium), estazolam (Prosom), oxazepam (Serax), temazepam (Restoril), triazolam (Halcion) (Last updated: 06/24/2023) ______________________________________________________________________     ______________________________________________________________________    Medication Recommendations and Reminders  Applies to: All patients receiving prescriptions (written and/or electronic).  Medication Rules & Regulations: You are responsible for reading, knowing, and following our Medication Rules document. These exist for your safety and that of others. They are not flexible and neither are we. Dismissing or ignoring them is an act of non-compliance that may result in complete and irreversible termination of such medication therapy. For safety reasons, non-compliance will not be tolerated. As with the U.S. fundamental legal principle of ignorance of the law is no defense, we will accept no excuses for not having read and knowing the content of documents provided to you by our practice.  Pharmacy of record:  Definition: This is the pharmacy where your electronic prescriptions will be sent.  We do not endorse any particular pharmacy. It is up to you and your insurance to decide what pharmacy to use.  We do not restrict you in your choice of pharmacy. However, once we write for your prescriptions, we will NOT be re-sending more prescriptions to fix restricted supply problems created by your pharmacy, or your insurance.  The pharmacy listed in the electronic medical record should be the one where you want  electronic prescriptions to be sent. If you choose to change pharmacy, simply notify our nursing staff. Changes will be made only during your regular appointments and not over the phone.  Recommendations: Keep all of your pain medications in a safe place, under lock and key, even if you live alone. We will NOT replace lost, stolen, or damaged medication. We do not accept Police Reports as proof of medications having been stolen. After you fill your prescription, take 1 week's worth of pills and put them away in a safe place. You should keep a separate, properly labeled bottle for this purpose. The remainder should be kept in the original bottle. Use this as your primary supply, until it runs out. Once it's gone, then you know that you have 1 week's worth of medicine, and it is time to come in for a prescription refill. If you do this correctly, it is unlikely that you will ever run out of medicine. To make sure that the above recommendation works, it is very important that you make sure your medication refill appointments are scheduled at least 1 week before you run out of medicine. To do this in an effective manner, make sure that you do not leave the office  without scheduling your next medication management appointment. Always ask the nursing staff to show you in your prescription , when your medication will be running out. Then arrange for the receptionist to get you a return appointment, at least 7 days before you run out of medicine. Do not wait until you have 1 or 2 pills left, to come in. This is very poor planning and does not take into consideration that we may need to cancel appointments due to bad weather, sickness, or emergencies affecting our staff. DO NOT ACCEPT A Partial Fill: If for any reason your pharmacy does not have enough pills/tablets to completely fill or refill your prescription, do not allow for a partial fill. The law allows the pharmacy to complete that prescription within 72  hours, without requiring a new prescription. If they do not fill the rest of your prescription within those 72 hours, you will need a separate prescription to fill the remaining amount, which we will NOT provide. If the reason for the partial fill is your insurance, you will need to talk to the pharmacist about payment alternatives for the remaining tablets, but again, DO NOT ACCEPT A PARTIAL FILL, unless you can trust your pharmacist to obtain the remainder of the pills within 72 hours.  Prescription refills and/or changes in medication(s):  Prescription refills, and/or changes in dose or medication, will be conducted only during scheduled medication management appointments. (Applies to both, written and electronic prescriptions.) No refills on procedure days. No medication will be changed or started on procedure days. No changes, adjustments, and/or refills will be conducted on a procedure day. Doing so will interfere with the diagnostic portion of the procedure. No phone refills. No medications will be called into the pharmacy. No Fax refills. No weekend refills. No Holliday refills. No after hours refills.  Remember:  Business hours are:  Monday to Thursday 8:00 AM to 4:00 PM Provider's Schedule: Eric Como, MD - Appointments are:  Medication management: Monday and Wednesday 8:00 AM to 4:00 PM Procedure day: Tuesday and Thursday 7:30 AM to 4:00 PM Wallie Sherry, MD - Appointments are:  Medication management: Tuesday and Thursday 8:00 AM to 4:00 PM Procedure day: Monday and Wednesday 7:30 AM to 4:00 PM (Last update: 06/24/2022) ______________________________________________________________________     ______________________________________________________________________    National Pain Medication Shortage  The [place] is experiencing worsening drug shortages. These have had a negative widespread effect on patient care and treatment. Not expected to improve any time soon.  Predicted to last past 2029.   Drug shortage list (generic names) Oxycodone  IR Oxycodone /APAP Oxymorphone IR Hydromorphone  Hydrocodone /APAP Morphine   Where is the problem?  Manufacturing and supply level.  Will this shortage affect you?  Only if you take any of the above pain medications.  How? You may be unable to fill your prescription.  Your pharmacist may offer a partial fill of your prescription. (Warning: Do not accept partial fills.) Prescriptions partially filled cannot be transferred to another pharmacy. Read our Medication Rules and Regulation. Depending on how much medicine you are dependent on, you may experience withdrawals when unable to get the medication.  Recommendations: Consider ending your dependence on opioid pain medications. Ask your pain specialist to assist you with the process. Consider switching to a medication currently not in shortage, such as Buprenorphine. Talk to your pain specialist about this option. Consider decreasing your pain medication requirements by managing tolerance thru Drug Holidays. This may help minimize withdrawals, should you run out of medicine. Control your pain  thru the use of non-pharmacological interventional therapies.   Your prescriber: Prescribers cannot be blamed for shortages. Medication manufacturing and supply issues cannot be fixed by the prescriber.   NOTE: The prescriber is not responsible for supplying the medication, or solving supply issues. Work with your pharmacist to solve it. The patient is responsible for the decision to take or continue taking the medication and for identifying and securing a legal supply source. By law, supplying the medication is the job and responsibility of the pharmacy. The prescriber is responsible for the evaluation, monitoring, and prescribing of these medications.   Prescribers will NOT: Re-issue prescriptions that have been partially filled. Re-issue prescriptions already sent to  a pharmacy.  Re-send prescriptions to a different pharmacy because yours did not have your medication. Ask pharmacist to order more medicine or transfer the prescription to another pharmacy. (Read below.)  New 2023 regulation: May 02, 2022 Revised Regulation Allows DEA-Registered Pharmacies to Transfer Electronic Prescriptions at a Patients Request DEA Headquarters Division - Public Information Office Patients now have the ability to request their electronic prescription be transferred to another pharmacy without having to go back to their practitioner to initiate the request. This revised regulation went into effect on Monday, April 28, 2022.     At a patients request, a DEA-registered retail pharmacy can now transfer an electronic prescription for a controlled substance (schedules II-V) to another DEA-registered retail pharmacy. Prior to this change, patients would have to go through their practitioner to cancel their prescription and have it re-issued to a different pharmacy. The process was taxing and time consuming for both patients and practitioners.    The Drug Enforcement Administration [facility]) published its intent to revise the process for transferring electronic prescriptions on July 20, 2020.  The final rule was published in the federal register on March 27, 2022 and went into effect 30 days later.  Under the final rule, a prescription can only be transferred once between pharmacies, and only if allowed under existing state or other applicable law. The prescription must remain in its electronic form; may not be altered in any way; and the transfer must be communicated directly between two licensed pharmacists. Its important to note, any authorized refills transfer with the original prescription, which means the entire prescription will be filled at the same pharmacy.   Reference: hugehand.is [employer] website announcement)  Cheapwipes.at.pdf [employer])   [employer] / Vol. 88, No. 143 / Thursday, March 27, 2022 / Rules and Regulations DEPARTMENT OF JUSTICE  Drug Enforcement Administration  21 CFR Part 1306  [Docket No. [id number]]  RIN [id number] Transfer of Electronic Prescriptions for Schedules II-V Controlled Substances Between Pharmacies for Initial Filling  ______________________________________________________________________       ______________________________________________________________________    Transfer of Pain Medication between Pharmacies  Re: 2023 DEA Clarification on existing regulation  Published on DEA Website: May 02, 2022  Title: Revised Regulation Allows DEA-Registered Pharmacies to Electrical Engineer Prescriptions at a Patients Request DEA Headquarters Division - [employer]  Patients now have the ability to request their electronic prescription be transferred to another pharmacy without having to go back to their practitioner to initiate the request. This revised regulation went into effect on Monday, April 28, 2022.     At a patients request, a DEA-registered retail pharmacy can now transfer an electronic prescription for a controlled substance (schedules II-V) to another DEA-registered retail pharmacy. Prior to this change, patients would have to go through their practitioner  to cancel their prescription and have it re-issued to a different pharmacy. The process was taxing and time consuming for both patients and practitioners.    The Drug Enforcement Administration [facility]) published its intent to revise the process for transferring electronic prescriptions on July 20, 2020.  The final rule was published in the  federal register on March 27, 2022 and went into effect 30 days later.  Under the final rule, a prescription can only be transferred once between pharmacies, and only if allowed under existing state or other applicable law. The prescription must remain in its electronic form; may not be altered in any way; and the transfer must be communicated directly between two licensed pharmacists. Its important to note, any authorized refills transfer with the original prescription, which means the entire prescription will be filled at the same pharmacy.    REFERENCES: 1. DEA website announcement hugehand.is  2. Department of Justice website  Cheapwipes.at.pdf  3. DEPARTMENT OF JUSTICE Drug Enforcement Administration 21 CFR Part 1306 [Docket No. [id number]] RIN [id number] Transfer of Electronic Prescriptions for Schedules II-V Controlled Substances Between Pharmacies for Initial Filling  ______________________________________________________________________       ______________________________________________________________________    Medication Transfer   Notification You are currently compliant and stable on your pain medication regimen. This regimen will be transferred today to your Primary Care Provider (PCP). You will be provided with enough prescriptions to last for 90 days. After that, your prescriptions will need to be taken over by your PCP.  Recommendation Immediately contact your primary care provider to secure an appointment for evaluation before this period is over. Do not wait until the last month to contact them.   Clarification The transfer of your medication regimen does not mean that you are being discharged from our clinic. We will remain available to you for any consultation or interventional therapies you may need.    Alternative Should you decide not to continue taking these medication and would like assistance in permanently stopping them, please let us  know so that we can design a slow tapering down of your regimen.  Reason Our primary responsibility to provide specialized interventional pain management therapies otherwise not available to the community. We have in the past assisted primary care providers with reviewing and adjusting pain medication management therapies, however, we have been transparent to all patients and referring providers that it is not our intention to permanently take over this type of therapy. Transfer of this portion of your care will assist us  in freeing time to assist others in need of our specialty services.   ______________________________________________________________________      ______________________________________________________________________    WARNING: CBD (cannabidiol) & Delta (Delta-8 tetrahydrocannabinol) products.   Applicable to:  All individuals currently taking or considering taking CBD (cannabidiol) and, more important, all patients taking opioid analgesic controlled substances (pain medication). (Example: oxycodone ; oxymorphone; hydrocodone ; hydromorphone ; morphine ; methadone; tramadol ; tapentadol; fentanyl ; buprenorphine; butorphanol; dextromethorphan ; meperidine ; codeine; etc.)  Introduction:  Recently there has been a drive towards the use of natural products for the treatment of different conditions, including pain anxiety and sleep disorders. Marijuana and hemp are two varieties of the cannabis genus plants. Marijuana and its derivatives are illegal, while hemp and its derivatives are not. Cannabidiol (CBD) and tetrahydrocannabinol (THC), are two natural compounds found in plants of the Cannabis genus. They can both be extracted from hemp or marijuana. Both compounds interact with your bodys endocannabinoid system in very different ways. CBD is  associated with pain relief (analgesia) while THC  is associated with the psychoactive effects (the high) obtained from the use of marijuana products. There are two main types of THC: Delta-9, which comes from the marijuana plant and it is illegal, and Delta-8, which comes from the hemp plant, and it is legal. (Both, Delta-9-THC and Delta-8-THC are psychoactive and give you the high.)   Legality:  Marijuana and its derivatives: illegal Hemp and its derivatives: Legal (State dependent) UPDATE: (10/18/2021) The Drug Enforcement Agency (DEA) issued a letter stating that delta cannabinoids, including Delta-8-THCO and Delta-9-THCO, synthetically derived from hemp do not qualify as hemp and will be viewed as Schedule I drugs. (Schedule I drugs, substances, or chemicals are defined as drugs with no currently accepted medical use and a high potential for abuse. Some examples of Schedule I drugs are: heroin, lysergic acid diethylamide (LSD), marijuana (cannabis), 3,4-methylenedioxymethamphetamine (ecstasy), methaqualone, and peyote.) (cuetune.com.ee)  Legal status of CBD in [place]:  Conditionally Legal  Reference: FDA Regulation of Cannabis and Cannabis-Derived Products, Including Cannabidiol (CBD) - oemdeals.dk  Warning:  CBD is not FDA approved and has not undergo the same manufacturing controls as prescription drugs.  This means that the purity and safety of available CBD may be questionable. Most of the time, despite manufacturer's claims, it is contaminated with THC (delta-9-tetrahydrocannabinol - the chemical in marijuana responsible for the HIGH).  When this is the case, the [facility] contaminant will trigger a positive urine drug screen (UDS) test for Marijuana (carboxy-THC).   The FDA recently put out a warning about 5 things that everyone should be aware of regarding Delta-8  THC: Delta-8 THC products have not been evaluated or approved by the FDA for safe use and may be marketed in ways that put the public health at risk. The FDA has received adverse event reports involving delta-8 THC-containing products. Delta-8 THC has psychoactive and intoxicating effects. Delta-8 THC manufacturing often involve use of potentially harmful chemicals to create the concentrations of delta-8 THC claimed in the marketplace. The final delta-8 THC product may have potentially harmful by-products (contaminants) due to the chemicals used in the process. Manufacturing of delta-8 THC products may occur in uncontrolled or unsanitary settings, which may lead to the presence of unsafe contaminants or other potentially harmful substances. Delta-8 THC products should be kept out of the reach of children and pets.  NOTE: Because a positive UDS for any illicit substance is a violation of our medication agreement, your opioid analgesics (pain medicine) may be permanently discontinued.  MORE ABOUT CBD  General Information: CBD was discovered in 96 and it is a derivative of the cannabis sativa genus plants (Marijuana and Hemp). It is one of the 113 identified substances found in Marijuana. It accounts for up to 40% of the plant's extract. As of 2018, preliminary clinical studies on CBD included research for the treatment of anxiety, movement disorders, and pain. CBD is available and consumed in multiple forms, including inhalation of smoke or vapor, as an aerosol spray, and by mouth. It may be supplied as an oil containing CBD, capsules, dried cannabis, or as a liquid solution. CBD is thought not to be as psychoactive as THC (delta-9-tetrahydrocannabinol - the chemical in marijuana responsible for the HIGH). Studies suggest that CBD may interact with different biological target receptors in the body, including cannabinoid and other neurotransmitter receptors. As of 2018 the mechanism of action for its  biological effects has not been determined.  Side-effects  Adverse reactions: Dry mouth, diarrhea, decreased appetite, fatigue, drowsiness, malaise, weakness, sleep disturbances, and  others.  Drug interactions:  CBD may interact with medications such as blood-thinners. CBD causes drowsiness on its own and it will increase drowsiness caused by other medications, including antihistamines (such as Benadryl), benzodiazepines (Xanax, Ativan , Valium), antipsychotics, antidepressants, opioids, alcohol and supplements such as kava, melatonin and St. John's Wort.  Other drug interactions: Brivaracetam (Briviact); Caffeine; Carbamazepine (Tegretol); Citalopram  (Celexa ); Clobazam (Onfi); Eslicarbazepine (Aptiom); Everolimus (Zostress); Lithium; Methadone (Dolophine); Rufinamide (Banzel); Sedative medications (CNS depressants); Sirolimus (Rapamune); Stiripentol (Diacomit); Tacrolimus (Prograf); Tamoxifen ; Soltamox); Topiramate (Topamax); Valproate; Warfarin (Coumadin); Zonisamide. (Last update: 08/11/2022) ______________________________________________________________________     ______________________________________________________________________    Appointment Information  It is our goal and responsibility to provide the medical community with assistance in the evaluation and management of patients with chronic pain. Unfortunately our resources are limited. Because we do not have an unlimited amount of time, or available appointments, we are required to closely monitor for unkept or cancelled appointments.  Patient's responsibilities: 1. Punctuality: Patients are required to be physically present in our office at least 15 minutes before their scheduled appointment. 2. Tardiness: Patients not physically present in our office at their scheduled appointment time will be rescheduled. 3. Plan ahead: Assume that you will encounter traffic and plan to arrive 30 minutes before your appointment. 4. Other  appointments and responsibilities: Do not schedule other appointments immediately before or after your scheduled appointment.  5. Be prepared: Make a list of everything that you need to discuss with your provider so that you use your time efficiently. Once the provider leaves your room, he/she will not return to your room to discuss anything that you neglected to bring up during your allowed time. 6. No children or pets: Do not bring children or pets to your appointment. 7. Cancelling or rescheduling your appointment: Advanced notification (more than 24 hours in advance) is required. 8. No Show: Not calling to cancel an appointment and simply not showing up is unacceptable. This leads to loss of appointments that could have been used by a patient in need. (See below)  Corrective process for repeat offenders:  No Shows: Three (3) No Shows within a 12 month period will result in an automatic discharge from our practice. Rescheduling or cancelling with more than 24 hours notice will not be penalized and will not count against you. Tardiness: If you have to be rescheduled three (3) times due to late arrivals, it will be counted as one (1) No Show. Cancellation or reschedule: Three (3) cancellations or rescheduling where notice was given with less than 24 hours in advance, will be recorded as one (1) No Show.  Types of appointments: New patient initial evaluation: These are evaluations only. Your initial patient questionnaire will be collected and entered into the system. A history of present illness will be taken. Prior lab work, imaging studies, and associated treatments will be reviewed. The provider may order appropriate diagnostic testing depending on their evaluation and review of available information. No treatments will be started on this visit. 2nd Follow-up visit: During this visit your provider will inform you of the results of the diagnostic tests ordered on the initial evaluation.  Based on the providers assessment, treatment options will be offered, at which the patient will decide if he/she is interested in the alternatives. If interested, a treatment plan will be established and started. Procedure visits: Post-procedure evaluation visits: Evaluation visits MM New problems Flare-up evaluations Follow-up after diagnostic testing ______________________________________________________________________

## 2024-08-16 NOTE — Progress Notes (Signed)
 Nursing Pain Medication Assessment:  Safety precautions to be maintained throughout the outpatient stay will include: orient to surroundings, keep bed in low position, maintain call bell within reach at all times, provide assistance with transfer out of bed and ambulation.  Medication Inspection Compliance: Pill count conducted under aseptic conditions, in front of the patient. Neither the pills nor the bottle was removed from the patient's sight at any time. Once count was completed pills were immediately returned to the patient in their original bottle.  Medication: Hydrocodone /APAP Pill/Patch Count: 25 of 60 pills/patches remain Pill/Patch Appearance: Markings consistent with prescribed medication Bottle Appearance: Standard pharmacy container. Clearly labeled. Filled Date: 22 / 23 / 2025 Last Medication intake:  Today

## 2024-08-16 NOTE — Progress Notes (Signed)
 PROVIDER NOTE: Interpretation of information contained herein should be left to medically-trained personnel. Specific patient instructions are provided elsewhere under Patient Instructions section of medical record. This document was created in part using AI and STT-dictation technology, any transcriptional errors that may result from this process are unintentional.  Patient: Tamara Finley  Service: E/M   PCP: Fernande Ophelia JINNY DOUGLAS, MD  DOB: December 27, 1940  DOS: 08/16/2024  Provider: Emmy MARLA Blanch, NP  MRN: [id number]  Delivery: Face-to-face  Specialty: Interventional Pain Management  Type: Established Patient  Setting: Ambulatory outpatient facility  Specialty designation: 09  Referring Prov.: Fernande Ophelia JINNY DOUGLAS, MD  Location: Outpatient office facility       History of present illness (HPI) Tamara Finley, a 83 y.o. year old female, is here today because of her Lumbar spondylosis [M47.816]. 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  Pain Assessment: Severity of Chronic pain is reported as a 8 /10. Location: Neck Right, Left/Down back. Onset: More than a month ago. Quality: Aching, Dull. Timing: Constant. Modifying factor(s): Lay down, heat. Vitals:   height is 5' 8 (1.727 m) and weight is 175 lb (79.4 kg). Her temporal temperature is 97 F (36.1 C) (abnormal). Her blood pressure is 125/75 and her pulse is 65. Her respiration is 18 and oxygen saturation is 95%.  BMI: Estimated body mass index is 26.61 kg/m as calculated from the following:   Height as of this encounter: 5' 8 (1.727 m).   Weight as of this encounter: 175 lb (79.4 kg).  Last encounter: 05/09/2024. Last procedure: 05/09/2024.  Reason for encounter: medication management.  The patient indicates doing well with current medication regimen.  No side effects or adverse reaction reported to medication.  Discussed the use of AI scribe software for clinical note transcription with the patient, who gave verbal consent to proceed.  History of Present Illness   Tamara Finley is an 83 year old female who presents with midline neck pain radiating to bilateral arms after sliding out of a chair.  She experiences neck pain described as soreness rather than sharp pain, which is felt across the neck. The pain began after she slid out of a chair, which occurred around 5 AM, according to the patient and interpreter (daughter). There is no radiation of pain to the shoulders or arms.  She manages the soreness with hydrocodone , specifically Norco 5-325, taken twice daily every 12 hours. No side effects such as constipation or sedation from the medication. She has not used any other medications like Tylenol  for the soreness.  She has not tried any non-pharmacological treatments for the soreness. She finds it difficult to move her neck due to the soreness.     Pharmacotherapy Assessment   Opioid Analgesic: Hydrocodone -acetaminophen  (Norco/Vicodin) 5-325 mg tablet every 12 hours as needed for pain. MME=10 Monitoring: [place] PMP: PDMP reviewed during this  encounter.       Pharmacotherapy: No side-effects or adverse reactions reported. Compliance: No problems identified. Effectiveness: Clinically  acceptable.  Tamara Finley, [place]  08/16/2024 10:53 AM  Sign when Signing Visit Nursing Pain Medication Assessment:  Safety precautions to be maintained throughout the outpatient stay will include: orient to surroundings, keep bed in low position, maintain call bell within reach at all times, provide assistance with transfer out of bed and ambulation.  Medication Inspection Compliance: Pill count conducted under aseptic conditions, in front of the patient. Neither the pills nor the bottle was removed from the patient's sight at any time. Once count was completed pills were immediately returned to the patient in their original bottle.  Medication: Hydrocodone /APAP Pill/Patch Count: 25 of 60 pills/patches remain Pill/Patch Appearance: Markings consistent with prescribed medication Bottle Appearance: Standard pharmacy container. Clearly labeled. Filled Date: 7 / 23 / 2025 Last Medication intake:  Today    UDS:  Summary  Date Value Ref Range Status  08/06/2023 FINAL  Final    Comment:    ==================================================================== [employer] [facility] 13 (MW) ==================================================================== Test                             Result       Flag       Units  Drug Present and Declared for Prescription Verification   Hydrocodone                     1225         EXPECTED   ng/mg creat   Dihydrocodeine                 87           EXPECTED   ng/mg creat   Norhydrocodone                 726          EXPECTED   ng/mg creat    Sources of hydrocodone  include scheduled prescription medications.    Dihydrocodeine and norhydrocodone are expected metabolites of    hydrocodone . Dihydrocodeine is also available as a scheduled    prescription medication.  ==================================================================== Test                      Result    Flag   Units      Ref Range   Creatinine              61               mg/dL       >=79 ==================================================================== Declared Medications:  The flagging and interpretation on this report are based on the  following declared medications.  Unexpected results may arise from  inaccuracies in the declared medications.   **Note: The testing scope of this panel includes these medications:   Hydrocodone  (Norco)   **Note: The testing scope of this panel does not include the  following reported medications:   Acetaminophen  (Tylenol )  Acetaminophen  (Norco)  Albuterol  (Ventolin  HFA)  Atorvastatin  (Lipitor)  Azelastine  (Astelin )  Calcium   Escitalopram (Lexapro)  Fluticasone  (Flonase )  Furosemide  (Lasix )  Gabapentin   Hydralazine  (Apresoline )  Levothyroxine  (Synthroid )  Mirtazapine  (Remeron )  Pantoprazole  (Protonix )  Potassium (Klor-Con )  Vitamin B12 ==================================================================== For clinical consultation, please call [phone]. ====================================================================     No results found for: CBDTHCR No results found for: D8THCCBX No results found for: D9THCCBX  ROS  Constitutional: Denies any fever or chills Gastrointestinal: No reported hemesis, hematochezia, vomiting, or acute GI distress Musculoskeletal: Low back pain, neck pain Neurological: No reported episodes of acute onset apraxia, aphasia, dysarthria, agnosia, amnesia, paralysis, loss of coordination, or loss of consciousness  Medication Review  Calcium  600+D Plus Minerals, HYDROcodone -acetaminophen , acetaminophen , albuterol , azelastine , carvedilol , cyanocobalamin , escitalopram, ezetimibe, fluticasone , furosemide , gabapentin , hydrALAZINE , levothyroxine , mirtazapine , naloxone , pantoprazole , and potassium chloride  SA  History Review  Allergy: Tamara Finley is allergic to atorvastatin , benadryl [diphenhydramine hcl (sleep)], and alprazolam. Drug: Tamara Finley  reports no history of drug  use. Alcohol :  reports no history of alcohol  use. Tobacco:  reports that she quit smoking about 11 years ago. Her smoking use included cigarettes. She started smoking about 61 years ago. She has a 100 pack-year smoking history. She has never used smokeless tobacco. Social: Tamara Finley  reports that she quit smoking about 11 years ago. Her smoking use included cigarettes. She started smoking about 61 years ago. She has a 100 pack-year smoking history. She has never used smokeless tobacco. She reports that she does not drink alcohol  and does not use drugs. Medical:  has a past medical history of Abdominal aortic aneurysm (AAA) (2000's), Age related entropion, unspecified laterality, Anxiety, Anxiety and depression, Bilateral renal artery stenosis, Breast calcification, left, Cancer (HCC) (10/20/2016), Carotid artery thrombosis, bilateral, Carotid stenosis, Chronic bronchitis (HCC), Chronic insomnia, COPD (chronic obstructive pulmonary disease) (HCC), Coronary artery disease, CRI (chronic renal insufficiency), stage 3 (moderate), Depression, Diabetes mellitus without complication (HCC), GERD (gastroesophageal reflux disease), Heart palpitations, History of AAA (abdominal aortic aneurysm) repair, History of colon polyps, History of TIA (transient ischemic attack), Hypercholesterolemia, Hypertension, Hypothyroidism, Migraine headache, Osteoarthritis, Osteoporosis, Peripheral arterial disease, Pulmonary scarring, Renal disorder, SOB (shortness of breath), Stenosis of left subclavian artery, Stroke (HCC), Thyroid  disease, TIA (transient ischemic attack), and Type 2 diabetes mellitus with vascular disease (HCC). Surgical: Tamara Finley  has a past surgical history that includes Cardiac surgery; Abdominal aortic aneurysm repair; Cardiac catheterization (N/A, 12/05/2015); Cardiac catheterization (Left, 12/31/2015); Breast excisional biopsy (Left, 2010); Breast excisional biopsy (Left, 07/01/2006); Breast biopsy (Left,  09/03/2016); Breast biopsy (Left, 09/25/2016); Bronchoscopy; CAROTID PTA/STENT INTERVENTION; Colonoscopy; Eye surgery; Laminectomy (07/25/2013); Back surgery; Mediastinoscopy; Mastectomy; Thoracoscopy (12/16/2016); and Colonoscopy with propofol  (N/A, 06/15/2017). Family: family history includes CVA in her mother; Cancer in her brother; Coronary artery disease in her father; Dementia in her father; Diabetes in her brother; Epilepsy in her mother; Hypertension in her father and mother; Ovarian cancer in her sister; Stroke in her father and mother.  Laboratory Chemistry Profile   Renal Lab Results  Component Value Date   BUN 17 12/18/2022   CREATININE 1.13 (H) 12/18/2022   GFRAA >60 01/01/2016   GFRNONAA 49 (L) 12/18/2022    Hepatic Lab Results  Component Value Date   AST 31 12/17/2022   ALT 14 12/17/2022   ALBUMIN 3.4 (L) 12/17/2022   ALKPHOS 48 12/17/2022    Electrolytes Lab Results  Component Value Date   NA 137 12/18/2022   K 4.4 12/18/2022   CL 107 12/18/2022   CALCIUM  7.4 (L) 12/18/2022    Bone No results found for: VD25OH, [id number], CI6874NY7, CI7874NY7, 25OHVITD1, 25OHVITD2, 25OHVITD3, TESTOFREE, TESTOSTERONE  Inflammation (CRP: Acute Phase) (ESR: Chronic Phase) Lab Results  Component Value Date   ESRSEDRATE 23 10/12/2015   LATICACIDVEN 1.6 12/17/2022         Note: Above Lab results reviewed.  Recent Imaging Review  DG Forearm Left CLINICAL DATA:  Pain after fall  5 days ago.  EXAM: LEFT HUMERUS - 2+ VIEW; LEFT FOREARM - 2 VIEW  COMPARISON:  None Available.  FINDINGS: Diffuse osseous demineralization. No acute osseous abnormality. Mild osteoarthritis of the acromioclavicular, glenohumeral, and ulnotrochlear joints. Mild-to-moderate radiocarpal joint space narrowing with apparent dorsal tilt of the lunate noted on the lateral view of the forearm, which can be seen in the setting of DISI (dorsal intercalated segment instability). Soft  tissue swelling of the forearm. Foci of vascular calcifications. No radiopaque foreign body.  IMPRESSION: 1. No acute osseous abnormality. 2. Mild-to-moderate radiocarpal joint space narrowing with apparent dorsal tilt of the lunate noted on the lateral view of the forearm, which can be seen in the setting of DISI (dorsal intercalated segment instability). 3. Mild osteoarthritis of the acromioclavicular, glenohumeral, and ulnotrochlear joints.  Electronically Signed   By: Harrietta Sherry M.D.   On: 02/12/2024 11:16 DG Humerus Left CLINICAL DATA:  Pain after fall 5 days ago.  EXAM: LEFT HUMERUS - 2+ VIEW; LEFT FOREARM - 2 VIEW  COMPARISON:  None Available.  FINDINGS: Diffuse osseous demineralization. No acute osseous abnormality. Mild osteoarthritis of the acromioclavicular, glenohumeral, and ulnotrochlear joints. Mild-to-moderate radiocarpal joint space narrowing with apparent dorsal tilt of the lunate noted on the lateral view of the forearm, which can be seen in the setting of DISI (dorsal intercalated segment instability). Soft tissue swelling of the forearm. Foci of vascular calcifications. No radiopaque foreign body.  IMPRESSION: 1. No acute osseous abnormality. 2. Mild-to-moderate radiocarpal joint space narrowing with apparent dorsal tilt of the lunate noted on the lateral view of the forearm, which can be seen in the setting of DISI (dorsal intercalated segment instability). 3. Mild osteoarthritis of the acromioclavicular, glenohumeral, and ulnotrochlear joints.  Electronically Signed   By: Harrietta Sherry M.D.   On: 02/12/2024 11:16 Note: Reviewed        Physical Exam  Vitals: BP 125/75 (BP Location: Right Arm, Patient Position: Sitting, Cuff Size: Normal)   Pulse 65   Temp (!) 97 F (36.1 C) (Temporal)   Resp 18   Ht 5' 8 (1.727 m)   Wt 175 lb (79.4 kg)   SpO2 95%   BMI 26.61 kg/m  BMI: Estimated body mass index is 26.61 kg/m as calculated from  the following:   Height as of this encounter: 5' 8 (1.727 m).   Weight as of this encounter: 175 lb (79.4 kg). Ideal: Ideal body weight: 63.9 kg (140 lb 14 oz) Adjusted ideal body weight: 70.1 kg (154 lb 8.4 oz) General appearance: Well nourished, well developed, and well hydrated. In no apparent acute distress Mental status: Alert, oriented x 3 (person, place, & time)       Respiratory: No evidence of acute respiratory distress Eyes: PERLA  Musculoskeletal: +LBP Neck pain (soreness) Assessment   Diagnosis Status  1. Lumbar spondylosis   2. Medication management   3. Cervicalgia   4. Lumbar facet arthropathy (L4/L5 and L5/S1)   5. Lumbar radiculopathy   6. Degeneration of intervertebral disc of lumbar region with discogenic back pain   7. Chronic pain syndrome   8. Chronic SI joint pain    Controlled Controlled Controlled   Updated Problems: No problems updated.  Plan of Care  Problem-specific:  Assessment and Plan    Cervicalgia Acute cervicalgia post-fall, managed with hydrocodone  and acetaminophen , no adverse effects. - Continue hydrocodone  5-325 mg every 12 hours as needed. - Apply hot pack to neck area.  Do not put directly on skin.  Chronic opioid therapy Managed with hydrocodone , no side effects reported. Narcan  prescribed for overdose prevention. - Renewed Narcan  nasal spray prescription  Medication management: Patient's pain is controlled with hydrocodone , will continue on current medication regimen.  Prescribing drug monitoring (PDMP) reviewed, findings consistent with the use of prescribed medication and no evidence of narcotic misuse or abuse.  Urine drug screening (drug serum order).  No side effects or adverse reaction reported to medication.  Schedule follow-up in 90 days for medication management.      Tamara Finley has a current medication list which includes the following long-term medication(s): albuterol , albuterol , azelastine , calcium  600+d  plus minerals, carvedilol , escitalopram, ezetimibe, furosemide , gabapentin , levothyroxine , mirtazapine , pantoprazole , and carvedilol .  Pharmacotherapy (Medications Ordered): Meds ordered this encounter  Medications   HYDROcodone -acetaminophen  (NORCO/VICODIN) 5-325 MG tablet    Sig: Take 1 tablet by mouth every 12 (twelve) hours as needed for severe pain (pain score 7-10). Must last 30 days    Dispense:  60 tablet    Refill:  0    Chronic Pain: STOP Act (Not applicable) Fill 1 day early if closed on refill date. Avoid benzodiazepines within 8 hours of opioids   HYDROcodone -acetaminophen  (NORCO/VICODIN) 5-325 MG tablet    Sig: Take 1 tablet by mouth every 12 (twelve) hours as needed for severe pain (pain score 7-10). Must last 30 days    Dispense:  60 tablet    Refill:  0    Chronic Pain: STOP Act (Not applicable) Fill 1 day early if closed on refill date. Avoid benzodiazepines within 8 hours of opioids   HYDROcodone -acetaminophen  (NORCO/VICODIN) 5-325 MG tablet    Sig: Take 1 tablet by mouth every 12 (twelve) hours as needed for severe pain (pain score 7-10). Must last 30 days    Dispense:  60 tablet    Refill:  0    Chronic Pain: STOP Act (Not applicable) Fill 1 day early if closed on refill date. Avoid benzodiazepines within 8 hours of opioids   naloxone  (NARCAN ) nasal spray 4 mg/0.1 mL    Sig: Place 1 spray into the nose as needed for up to 365 doses (for opioid-induced respiratory depresssion). In case of emergency (overdose), spray once into each nostril. If no response within 3 minutes, repeat application and call 911.    Dispense:  1 each    Refill:  1    Instruct patient in proper use of device.   Orders:  Orders Placed This Encounter  Procedures   Drug Screen 10 W/Conf, Serum    Release to patient:   Immediate        Return in about 3 months (around 11/14/2024) for (F2F), (MM), Emmy Blanch NP.    Recent Visits No visits were found meeting these conditions. Showing recent  visits within past 90 days and meeting all other requirements Today's Visits Date Type Provider Dept  08/16/24 Office Visit Carola Viramontes K, NP [facility]  Showing today's visits and meeting all other requirements Future Appointments Date Type Provider Dept  11/07/24 Appointment Janecia Palau K, NP [facility]  Showing future appointments within next 90 days and meeting all other requirements  I discussed the assessment and treatment plan with the patient. The patient was provided an opportunity to ask questions and all were answered. The patient agreed with the plan and demonstrated an understanding of the instructions.  Patient advised to call back or seek an in-person evaluation if the symptoms or condition worsens.  I personally spent  a total of 30 minutes in the care of the patient today including preparing to see the patient, getting/reviewing separately obtained history, performing a medically appropriate exam/evaluation, counseling and educating, placing orders, referring and communicating with other health care professionals, documenting clinical information in the EHR, independently interpreting results, communicating results, and coordinating care.   Note by: Sriya Kroeze K Jaylen Knope, NP (TTS and AI technology used. I apologize for any typographical errors that were not detected and corrected.) Date: 08/16/2024; Time: 11:48 AM

## 2024-08-27 LAB — OPIATES,MS,WB/SP RFX
6-Acetylmorphine: NEGATIVE
Codeine: NEGATIVE ng/mL
Dihydrocodeine: 5.8 ng/mL
Hydrocodone: 59.8 ng/mL
Hydromorphone: NEGATIVE ng/mL
Morphine: NEGATIVE ng/mL
Opiate Confirmation: POSITIVE

## 2024-08-27 LAB — DRUG SCREEN 10 W/CONF, SERUM
Amphetamines, IA: NEGATIVE ng/mL
Barbiturates, IA: NEGATIVE ug/mL
Benzodiazepines, IA: NEGATIVE ng/mL
Cocaine & Metabolite, IA: NEGATIVE ng/mL
Methadone, IA: NEGATIVE ng/mL
Opiates, IA: POSITIVE ng/mL — AB
Oxycodones, IA: NEGATIVE ng/mL
Phencyclidine, IA: NEGATIVE ng/mL
Propoxyphene, IA: NEGATIVE ng/mL
THC(Marijuana) Metabolite, IA: NEGATIVE ng/mL

## 2024-08-27 LAB — OXYCODONES,MS,WB/SP RFX
Oxycocone: NEGATIVE ng/mL
Oxycodones Confirmation: NEGATIVE
Oxymorphone: NEGATIVE ng/mL

## 2024-11-07 ENCOUNTER — Encounter: Admitting: Nurse Practitioner

## 2025-01-31 ENCOUNTER — Ambulatory Visit (INDEPENDENT_AMBULATORY_CARE_PROVIDER_SITE_OTHER): Admitting: Vascular Surgery

## 2025-01-31 ENCOUNTER — Encounter (INDEPENDENT_AMBULATORY_CARE_PROVIDER_SITE_OTHER)
